# Patient Record
Sex: Female | Born: 1955 | Race: White | Hispanic: No | Smoking: Former smoker
Health system: Southern US, Community
[De-identification: ages and names within clinical notes are randomized; demographics above are authoritative.]

## PROBLEM LIST (undated history)

## (undated) DIAGNOSIS — E8881 Metabolic syndrome: Secondary | ICD-10-CM

## (undated) DIAGNOSIS — M549 Dorsalgia, unspecified: Secondary | ICD-10-CM

## (undated) DIAGNOSIS — I429 Cardiomyopathy, unspecified: Secondary | ICD-10-CM

## (undated) DIAGNOSIS — E079 Disorder of thyroid, unspecified: Secondary | ICD-10-CM

## (undated) DIAGNOSIS — I10 Essential (primary) hypertension: Secondary | ICD-10-CM

## (undated) DIAGNOSIS — N920 Excessive and frequent menstruation with regular cycle: Secondary | ICD-10-CM

## (undated) DIAGNOSIS — N959 Unspecified menopausal and perimenopausal disorder: Secondary | ICD-10-CM

## (undated) DIAGNOSIS — E049 Nontoxic goiter, unspecified: Secondary | ICD-10-CM

## (undated) DIAGNOSIS — G5600 Carpal tunnel syndrome, unspecified upper limb: Secondary | ICD-10-CM

## (undated) DIAGNOSIS — E0590 Thyrotoxicosis, unspecified without thyrotoxic crisis or storm: Secondary | ICD-10-CM

## (undated) DIAGNOSIS — E669 Obesity, unspecified: Secondary | ICD-10-CM

## (undated) DIAGNOSIS — M62838 Other muscle spasm: Secondary | ICD-10-CM

## (undated) DIAGNOSIS — D649 Anemia, unspecified: Secondary | ICD-10-CM

## (undated) DIAGNOSIS — M542 Cervicalgia: Secondary | ICD-10-CM

## (undated) DIAGNOSIS — H8110 Benign paroxysmal vertigo, unspecified ear: Secondary | ICD-10-CM

## (undated) HISTORY — DX: Other muscle spasm: M62.838

## (undated) HISTORY — DX: Nontoxic goiter, unspecified: E04.9

## (undated) HISTORY — DX: Disorder of thyroid, unspecified: E07.9

## (undated) HISTORY — DX: Cardiomyopathy, unspecified: I42.9

## (undated) HISTORY — DX: Unspecified menopausal and perimenopausal disorder: N95.9

## (undated) HISTORY — DX: Anemia, unspecified: D64.9

## (undated) HISTORY — DX: Obesity, unspecified: E66.9

## (undated) HISTORY — DX: Metabolic syndrome: E88.810

## (undated) HISTORY — DX: Cervicalgia: M54.2

## (undated) HISTORY — DX: Carpal tunnel syndrome, unspecified upper limb: G56.00

## (undated) HISTORY — DX: Essential (primary) hypertension: I10

## (undated) HISTORY — DX: Thyrotoxicosis, unspecified without thyrotoxic crisis or storm: E05.90

## (undated) HISTORY — DX: Excessive and frequent menstruation with regular cycle: N92.0

## (undated) HISTORY — DX: Metabolic syndrome: E88.81

## (undated) HISTORY — DX: Dorsalgia, unspecified: M54.9

## (undated) HISTORY — DX: Benign paroxysmal vertigo, unspecified ear: H81.10

## (undated) HISTORY — PX: CHOLECYSTECTOMY: SHX55

---

## 1986-10-23 HISTORY — PX: HEEL SPUR SURGERY: SHX665

## 1989-10-23 HISTORY — PX: TUBAL LIGATION: SHX77

## 2006-02-20 ENCOUNTER — Ambulatory Visit: Payer: Self-pay | Admitting: Family Medicine

## 2006-03-05 ENCOUNTER — Ambulatory Visit: Payer: Self-pay | Admitting: Family Medicine

## 2006-04-08 ENCOUNTER — Ambulatory Visit: Payer: Self-pay | Admitting: Family Medicine

## 2006-04-09 ENCOUNTER — Ambulatory Visit: Payer: Self-pay | Admitting: Family Medicine

## 2006-05-06 ENCOUNTER — Ambulatory Visit: Payer: Self-pay | Admitting: Family Medicine

## 2006-05-23 HISTORY — PX: GREAT TOE ARTHRODESIS, METATARSALPHALANGEAL JOINT: SUR56

## 2006-06-17 ENCOUNTER — Ambulatory Visit: Payer: Self-pay | Admitting: Family Medicine

## 2006-07-29 ENCOUNTER — Encounter: Payer: Self-pay | Admitting: Family Medicine

## 2006-07-29 ENCOUNTER — Ambulatory Visit: Payer: Self-pay | Admitting: Family Medicine

## 2006-07-29 ENCOUNTER — Other Ambulatory Visit: Admission: RE | Admit: 2006-07-29 | Discharge: 2006-07-29 | Payer: Self-pay | Admitting: Family Medicine

## 2006-09-30 DIAGNOSIS — L219 Seborrheic dermatitis, unspecified: Secondary | ICD-10-CM | POA: Insufficient documentation

## 2006-09-30 DIAGNOSIS — I10 Essential (primary) hypertension: Secondary | ICD-10-CM | POA: Insufficient documentation

## 2006-09-30 DIAGNOSIS — E7800 Pure hypercholesterolemia, unspecified: Secondary | ICD-10-CM | POA: Insufficient documentation

## 2006-10-06 ENCOUNTER — Encounter: Payer: Self-pay | Admitting: Family Medicine

## 2006-10-06 DIAGNOSIS — E042 Nontoxic multinodular goiter: Secondary | ICD-10-CM | POA: Insufficient documentation

## 2006-10-06 DIAGNOSIS — E348 Other specified endocrine disorders: Secondary | ICD-10-CM | POA: Insufficient documentation

## 2006-10-29 ENCOUNTER — Encounter: Payer: Self-pay | Admitting: Family Medicine

## 2006-10-29 ENCOUNTER — Ambulatory Visit: Payer: Self-pay | Admitting: Family Medicine

## 2006-10-29 DIAGNOSIS — G43009 Migraine without aura, not intractable, without status migrainosus: Secondary | ICD-10-CM | POA: Insufficient documentation

## 2006-11-04 ENCOUNTER — Ambulatory Visit: Payer: Self-pay | Admitting: Family Medicine

## 2006-11-04 LAB — CONVERTED CEMR LAB: Blood Glucose, Fasting: 105 mg/dL

## 2007-01-26 ENCOUNTER — Ambulatory Visit: Payer: Self-pay | Admitting: Family Medicine

## 2007-01-26 DIAGNOSIS — M62838 Other muscle spasm: Secondary | ICD-10-CM | POA: Insufficient documentation

## 2007-01-26 DIAGNOSIS — G5600 Carpal tunnel syndrome, unspecified upper limb: Secondary | ICD-10-CM | POA: Insufficient documentation

## 2007-01-30 ENCOUNTER — Telehealth: Payer: Self-pay | Admitting: Family Medicine

## 2007-02-19 ENCOUNTER — Ambulatory Visit: Payer: Self-pay | Admitting: Family Medicine

## 2007-02-23 ENCOUNTER — Telehealth: Payer: Self-pay | Admitting: Family Medicine

## 2007-03-02 ENCOUNTER — Telehealth: Payer: Self-pay | Admitting: Family Medicine

## 2007-03-05 ENCOUNTER — Ambulatory Visit: Payer: Self-pay | Admitting: Family Medicine

## 2007-03-05 DIAGNOSIS — M549 Dorsalgia, unspecified: Secondary | ICD-10-CM | POA: Insufficient documentation

## 2007-03-05 DIAGNOSIS — E669 Obesity, unspecified: Secondary | ICD-10-CM | POA: Insufficient documentation

## 2007-03-05 LAB — CONVERTED CEMR LAB
Bilirubin Urine: NEGATIVE
Blood in Urine, dipstick: NEGATIVE
Glucose, Urine, Semiquant: NEGATIVE
Protein, U semiquant: NEGATIVE
Specific Gravity, Urine: 1.01
pH: 6

## 2007-03-11 ENCOUNTER — Encounter: Payer: Self-pay | Admitting: Family Medicine

## 2007-03-12 ENCOUNTER — Encounter: Payer: Self-pay | Admitting: Family Medicine

## 2007-03-12 LAB — CONVERTED CEMR LAB
ALT: 13 units/L (ref 0–35)
AST: 13 units/L (ref 0–37)
CO2: 21 meq/L (ref 19–32)
Cholesterol: 194 mg/dL (ref 0–200)
Creatinine, Ser: 0.69 mg/dL (ref 0.40–1.20)
Free T4: 0.93 ng/dL (ref 0.89–1.80)
LDL Cholesterol: 116 mg/dL — ABNORMAL HIGH (ref 0–99)
Sodium: 139 meq/L (ref 135–145)
T3, Free: 2.8 pg/mL (ref 2.3–4.2)
TSH: 2.142 microintl units/mL (ref 0.350–5.50)
Total Bilirubin: 0.9 mg/dL (ref 0.3–1.2)
Total CHOL/HDL Ratio: 3.5
Total Protein: 7.6 g/dL (ref 6.0–8.3)
VLDL: 22 mg/dL (ref 0–40)

## 2007-03-18 ENCOUNTER — Ambulatory Visit: Payer: Self-pay | Admitting: Family Medicine

## 2007-04-16 ENCOUNTER — Telehealth (INDEPENDENT_AMBULATORY_CARE_PROVIDER_SITE_OTHER): Payer: Self-pay | Admitting: *Deleted

## 2007-04-29 ENCOUNTER — Ambulatory Visit: Payer: Self-pay | Admitting: Family Medicine

## 2007-05-19 ENCOUNTER — Ambulatory Visit: Payer: Self-pay | Admitting: Family Medicine

## 2007-05-22 ENCOUNTER — Encounter: Payer: Self-pay | Admitting: Family Medicine

## 2007-06-08 ENCOUNTER — Telehealth: Payer: Self-pay | Admitting: Family Medicine

## 2007-08-10 ENCOUNTER — Ambulatory Visit: Payer: Self-pay | Admitting: Family Medicine

## 2007-08-10 ENCOUNTER — Other Ambulatory Visit: Admission: RE | Admit: 2007-08-10 | Discharge: 2007-08-10 | Payer: Self-pay | Admitting: Family Medicine

## 2007-08-10 ENCOUNTER — Encounter: Payer: Self-pay | Admitting: Family Medicine

## 2007-08-10 DIAGNOSIS — N959 Unspecified menopausal and perimenopausal disorder: Secondary | ICD-10-CM | POA: Insufficient documentation

## 2007-08-11 ENCOUNTER — Telehealth: Payer: Self-pay | Admitting: Family Medicine

## 2007-08-12 ENCOUNTER — Encounter: Payer: Self-pay | Admitting: Family Medicine

## 2007-08-12 LAB — CONVERTED CEMR LAB: Pap Smear: NORMAL

## 2007-08-13 LAB — CONVERTED CEMR LAB: Progesterone: 0.6 ng/mL

## 2007-08-14 ENCOUNTER — Telehealth: Payer: Self-pay | Admitting: Family Medicine

## 2007-08-17 ENCOUNTER — Ambulatory Visit: Payer: Self-pay | Admitting: Family Medicine

## 2007-08-25 ENCOUNTER — Telehealth: Payer: Self-pay | Admitting: Family Medicine

## 2007-12-30 ENCOUNTER — Ambulatory Visit: Payer: Self-pay | Admitting: Family Medicine

## 2007-12-30 ENCOUNTER — Encounter: Admission: RE | Admit: 2007-12-30 | Discharge: 2007-12-30 | Payer: Self-pay | Admitting: Family Medicine

## 2007-12-30 DIAGNOSIS — M542 Cervicalgia: Secondary | ICD-10-CM | POA: Insufficient documentation

## 2008-01-13 ENCOUNTER — Encounter: Admission: RE | Admit: 2008-01-13 | Discharge: 2008-02-10 | Payer: Self-pay | Admitting: Family Medicine

## 2008-01-13 ENCOUNTER — Ambulatory Visit: Payer: Self-pay | Admitting: Family Medicine

## 2008-01-13 ENCOUNTER — Encounter: Payer: Self-pay | Admitting: Family Medicine

## 2008-02-10 ENCOUNTER — Encounter: Payer: Self-pay | Admitting: Family Medicine

## 2008-02-12 ENCOUNTER — Telehealth: Payer: Self-pay | Admitting: Family Medicine

## 2008-02-16 ENCOUNTER — Ambulatory Visit: Payer: Self-pay | Admitting: Family Medicine

## 2008-02-16 LAB — CONVERTED CEMR LAB: WBC, Wet Prep HPF POC: NONE SEEN

## 2008-02-22 ENCOUNTER — Encounter: Payer: Self-pay | Admitting: Family Medicine

## 2008-02-24 ENCOUNTER — Telehealth: Payer: Self-pay | Admitting: Family Medicine

## 2008-02-25 ENCOUNTER — Ambulatory Visit: Payer: Self-pay | Admitting: Family Medicine

## 2008-02-25 DIAGNOSIS — N920 Excessive and frequent menstruation with regular cycle: Secondary | ICD-10-CM | POA: Insufficient documentation

## 2008-02-25 DIAGNOSIS — D649 Anemia, unspecified: Secondary | ICD-10-CM | POA: Insufficient documentation

## 2008-02-25 LAB — CONVERTED CEMR LAB: Hemoglobin: 10.1 g/dL

## 2008-02-26 LAB — CONVERTED CEMR LAB
Hemoglobin: 10.1 g/dL — ABNORMAL LOW (ref 12.0–15.0)
MCHC: 33.7 g/dL (ref 30.0–36.0)
RBC: 3.5 M/uL — ABNORMAL LOW (ref 3.87–5.11)
WBC: 5.4 10*3/uL (ref 4.0–10.5)

## 2008-03-01 ENCOUNTER — Encounter: Admission: RE | Admit: 2008-03-01 | Discharge: 2008-03-28 | Payer: Self-pay | Admitting: Family Medicine

## 2008-03-02 ENCOUNTER — Ambulatory Visit: Payer: Self-pay | Admitting: Obstetrics & Gynecology

## 2008-03-08 ENCOUNTER — Encounter: Admission: RE | Admit: 2008-03-08 | Discharge: 2008-03-08 | Payer: Self-pay | Admitting: Obstetrics & Gynecology

## 2008-03-09 ENCOUNTER — Ambulatory Visit: Payer: Self-pay | Admitting: Obstetrics & Gynecology

## 2008-03-13 ENCOUNTER — Inpatient Hospital Stay (HOSPITAL_COMMUNITY): Admission: AD | Admit: 2008-03-13 | Discharge: 2008-03-13 | Payer: Self-pay | Admitting: Obstetrics & Gynecology

## 2008-03-23 HISTORY — PX: ENDOMETRIAL ABLATION: SHX621

## 2008-03-23 HISTORY — PX: DILATION AND CURETTAGE OF UTERUS: SHX78

## 2008-03-28 ENCOUNTER — Encounter: Payer: Self-pay | Admitting: Family Medicine

## 2008-04-05 ENCOUNTER — Ambulatory Visit (HOSPITAL_COMMUNITY): Admission: RE | Admit: 2008-04-05 | Discharge: 2008-04-05 | Payer: Self-pay | Admitting: Obstetrics & Gynecology

## 2008-04-05 ENCOUNTER — Ambulatory Visit: Payer: Self-pay | Admitting: Obstetrics & Gynecology

## 2008-04-05 ENCOUNTER — Encounter: Payer: Self-pay | Admitting: Obstetrics & Gynecology

## 2008-04-27 ENCOUNTER — Ambulatory Visit: Payer: Self-pay | Admitting: Obstetrics & Gynecology

## 2008-04-27 ENCOUNTER — Encounter: Admission: RE | Admit: 2008-04-27 | Discharge: 2008-04-27 | Payer: Self-pay | Admitting: Obstetrics & Gynecology

## 2010-06-14 ENCOUNTER — Encounter: Payer: Self-pay | Admitting: Family Medicine

## 2010-06-14 ENCOUNTER — Ambulatory Visit: Payer: Self-pay | Admitting: Family Medicine

## 2010-11-13 ENCOUNTER — Ambulatory Visit: Payer: Self-pay | Admitting: Family Medicine

## 2010-11-13 LAB — CONVERTED CEMR LAB
BUN: 12 mg/dL (ref 6–23)
CO2: 30 meq/L (ref 19–32)
Chloride: 102 meq/L (ref 96–112)
Glucose, Bld: 121 mg/dL — ABNORMAL HIGH (ref 70–99)
Potassium: 3.4 meq/L — ABNORMAL LOW (ref 3.5–5.3)
Sodium: 139 meq/L (ref 135–145)

## 2010-12-25 ENCOUNTER — Ambulatory Visit
Admission: RE | Admit: 2010-12-25 | Discharge: 2010-12-25 | Payer: Self-pay | Source: Home / Self Care | Attending: Family Medicine | Admitting: Family Medicine

## 2010-12-25 ENCOUNTER — Ambulatory Visit
Admission: RE | Admit: 2010-12-25 | Discharge: 2010-12-25 | Payer: Self-pay | Source: Home / Self Care | Attending: Obstetrics & Gynecology | Admitting: Obstetrics & Gynecology

## 2010-12-25 ENCOUNTER — Encounter: Payer: Self-pay | Admitting: Obstetrics & Gynecology

## 2010-12-25 ENCOUNTER — Encounter
Admission: RE | Admit: 2010-12-25 | Discharge: 2010-12-25 | Payer: Self-pay | Source: Home / Self Care | Attending: Family Medicine | Admitting: Family Medicine

## 2010-12-26 ENCOUNTER — Encounter: Payer: Self-pay | Admitting: Obstetrics & Gynecology

## 2010-12-26 ENCOUNTER — Encounter
Admission: RE | Admit: 2010-12-26 | Discharge: 2010-12-26 | Payer: Self-pay | Source: Home / Self Care | Attending: Obstetrics & Gynecology | Admitting: Obstetrics & Gynecology

## 2010-12-26 ENCOUNTER — Telehealth: Payer: Self-pay | Admitting: Family Medicine

## 2010-12-26 LAB — CONVERTED CEMR LAB
Clue Cells Wet Prep HPF POC: NONE SEEN
Trich, Wet Prep: NONE SEEN

## 2010-12-27 ENCOUNTER — Telehealth: Payer: Self-pay | Admitting: Family Medicine

## 2011-01-02 ENCOUNTER — Ambulatory Visit
Admission: RE | Admit: 2011-01-02 | Discharge: 2011-01-02 | Payer: Self-pay | Source: Home / Self Care | Attending: Obstetrics & Gynecology | Admitting: Obstetrics & Gynecology

## 2011-01-03 ENCOUNTER — Ambulatory Visit
Admission: RE | Admit: 2011-01-03 | Discharge: 2011-01-03 | Payer: Self-pay | Source: Home / Self Care | Attending: Family Medicine | Admitting: Family Medicine

## 2011-01-03 ENCOUNTER — Encounter: Payer: Self-pay | Admitting: Obstetrics & Gynecology

## 2011-01-03 ENCOUNTER — Telehealth: Payer: Self-pay | Admitting: Family Medicine

## 2011-01-03 DIAGNOSIS — H8110 Benign paroxysmal vertigo, unspecified ear: Secondary | ICD-10-CM | POA: Insufficient documentation

## 2011-01-03 LAB — CONVERTED CEMR LAB: Trich, Wet Prep: NONE SEEN

## 2011-01-04 ENCOUNTER — Encounter: Payer: Self-pay | Admitting: Family Medicine

## 2011-01-04 LAB — CONVERTED CEMR LAB
Free T4: 1.07 ng/dL (ref 0.80–1.80)
T3, Free: 3.4 pg/mL (ref 2.3–4.2)
TSH: 0.225 microintl units/mL — ABNORMAL LOW (ref 0.350–4.500)

## 2011-01-13 ENCOUNTER — Encounter: Payer: Self-pay | Admitting: Obstetrics & Gynecology

## 2011-01-16 ENCOUNTER — Ambulatory Visit: Admit: 2011-01-16 | Payer: Self-pay | Admitting: Obstetrics & Gynecology

## 2011-01-16 ENCOUNTER — Other Ambulatory Visit: Payer: Self-pay | Admitting: Family Medicine

## 2011-01-16 DIAGNOSIS — R899 Unspecified abnormal finding in specimens from other organs, systems and tissues: Secondary | ICD-10-CM

## 2011-01-22 NOTE — Letter (Signed)
Summary: Records Dated 05-01-09 thru 05-03-10/[facility]  Records Dated 05-01-09 thru 05-03-10/[facility]   Imported By: Lanelle Bal 08/09/2010 11:59:21  _____________________________________________________________________  External Attachment:    Type:   Image     Comment:   External Document

## 2011-01-22 NOTE — Assessment & Plan Note (Signed)
Summary: HTN   Vital Signs:  Patient profile:   55 year old female Height:      64.75 inches Weight:      218 pounds Pulse rate:   92 / minute BP sitting:   123 / 73  (right arm) Cuff size:   large  Vitals Entered By: Avon Gully CMA, (AAMA) (November 13, 2010 10:15 AM) CC: f/u BP, diovan works but too expensive with no ins, ateno l makes nauseaous and dizzy   Primary Care Provider:  Nani Gasser MD  CC:  f/u BP, diovan works but too expensive with no ins, and ateno l makes nauseaous and dizzy.  History of Present Illness: f/u BP, diovan works but too expensive with no ins, ateno l makes nauseaous and dizzy.  she does take both medications at the same time.  She says often the dizziness will hit right before she takes her medications in the evening.  He will start about a half an hour to one hour before and often happens when she is driving home from work.  She has started a new job and will hopefully have insurance in two to 3 months.  She does have a few weeks' worth of the Diovan left.  She was able to afford the atenolol.  Current Medications (verified): 1)  Diovan 320 Mg Tabs (Valsartan) .Marland Kitchen.. 1 Tab By Mouth Daily 2)  Valerian Root 450 Mg Caps (Valerian) .... Take 1 Tab By Mouth At Bedtime 3)  Vitamin D 4)  Atenolol-Chlorthalidone 50-25 Mg Tabs (Atenolol-Chlorthalidone) .Marland Kitchen.. 1 Tab By Mouth Daily  Allergies (verified): 1)  ! Hydrochlorothiazide 2)  ! Prinivil 3)  ! Lopressor 4)  ! Zocor 5)  ! * Caduet 6)  Cardizem La (Diltiazem Hcl Coated Beads)  Comments:  Nurse/Medical Assistant: The patient's medications and allergies were reviewed with the patient and were updated in the Medication and Allergy Lists. Avon Gully CMA, Duncan Dull) (November 13, 2010 10:16 AM)  Physical Exam  General:  Well-developed,well-nourished,in no acute distress; alert,appropriate and cooperative throughout examination Lungs:  Normal respiratory effort, chest expands symmetrically.  Lungs are clear to auscultation, no crackles or wheezes. Heart:  Normal rate and regular rhythm. S1 and S2 normal without gallop, murmur, click, rub or other extra sounds. No carotid bruits.  Extremities:  No LE edema.  Skin:  no rashes.   Psych:  Cognition and judgment appear intact. Alert and cooperative with normal attention span and concentration. No apparent delusions, illusions, hallucinations   Impression & Recommendations:  Problem # 1:  HYPERTENSION, BENIGN SYSTEMIC (ICD-401.1) her blood pressure looks wonderful today on her current regimen.  I did give HER a 2 months worth of samples of the Diovan.  also a wrote for a 90 day prescription of the atenolol /chlorthalidone.  Her updated medication list for this problem includes:    Diovan 320 Mg Tabs (Valsartan) .Marland Kitchen... 1 tab by mouth daily    Atenolol-chlorthalidone 50-25 Mg Tabs (Atenolol-chlorthalidone) .Marland Kitchen... 1 tab by mouth daily  Orders: T-Basic Metabolic Panel [id number])  Complete Medication List: 1)  Diovan 320 Mg Tabs (Valsartan) .Marland Kitchen.. 1 tab by mouth daily 2)  Valerian Root 450 Mg Caps (Valerian) .... Take 1 tab by mouth at bedtime 3)  Vitamin D  4)  Atenolol-chlorthalidone 50-25 Mg Tabs (Atenolol-chlorthalidone) .Marland Kitchen.. 1 tab by mouth daily Prescriptions: ATENOLOL-CHLORTHALIDONE 50-25 MG TABS (ATENOLOL-CHLORTHALIDONE) 1 tab by mouth daily  #90 x 1   Entered and Authorized by:   Nani Gasser MD   Signed by:  Nani Gasser MD on 11/13/2010   Method used:   Electronically to        [facility] [phone]* (retail)       [address]       [place], [place]  [address]       Ph: [phone]       Fax: [phone]   RxID:   [id number]    Orders Added: 1)  T-Basic Metabolic Panel [id number] 2)  Est. Patient Level II [34742]

## 2011-01-22 NOTE — Assessment & Plan Note (Signed)
Summary: f/u HTN   Vital Signs:  Patient profile:   55 year old female Height:      64.75 inches Weight:      213 pounds BMI:     35.85 O2 Sat:      98 % on Room air Pulse rate:   83 / minute BP sitting:   175 / 88  (left arm) Cuff size:   large  Vitals Entered By: Payton Spark CMA (June 14, 2010 11:36 AM)  O2 Flow:  Room air CC: Elevated BP   Primary Care Provider:  Nani Gasser MD  CC:  Elevated BP.  History of Present Illness: 55 yo WF presents for HTN today.  She has been having HAs down the back of her head, nausea, wt gain in the past 2 mos.  She has a little swelling in her hands and feet.  She just moved back here from [facility].  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  She is also going thru perimenopause.  She has had mutliple adverse SEs from anti hypertensives but no true allergies.  She is currently on Diovan 320 mg/ day.  Cost is also a factor.  She has no insurance.   Current Medications (verified): 1)  Diovan 320 Mg Tabs (Valsartan) .... Take 1 Tab By Mouth Once Daily 2)  Valerian Root 450 Mg Caps (Valerian) .... Take 1 Tab By Mouth At Bedtime 3)  Vitamin D  Allergies (verified): 1)  ! Hydrochlorothiazide 2)  ! Prinivil 3)  ! Lopressor 4)  ! Zocor 5)  ! * Caduet 6)  Cardizem La (Diltiazem Hcl Coated Beads)  Past History:  Past Medical History: Reviewed history from 10/06/2006 and no changes required. Thyroid D/O 1998-1999  Social History: Reviewed history from 10/06/2006 and no changes required. Engineer, production at [employer].  14 yrs education.  Married to [employer] with 3 children from prior relationship.  Quit smoking 09/2004.  1 EtOH/wk, no drugs, 3-4 caffeinated drinks per day, no regular exercise.  Review of Systems      See HPI  Physical Exam  General:  obese WF in NAD Head:  normocephalic and atraumatic.   Eyes:  pupils equal, pupils round, and pupils reactive to light.    Mouth:  pharynx pink and moist.   Neck:  no masses.   Lungs:  Normal respiratory effort, chest expands symmetrically. Lungs are clear to auscultation, no crackles or wheezes. Heart:  Normal rate and regular rhythm. S1 and S2 normal without gallop, murmur, click, rub or other extra sounds. Pulses:  2+ radial pulses Extremities:  1+ non pitting LE edema Skin:  color normal.   Psych:  good eye contact, not anxious appearing, and not depressed appearing.     Impression & Recommendations:  Problem # 1:  HYPERTENSION, BENIGN SYSTEMIC (ICD-401.1) Reviewed notes and discussed that she has not had any true ALLERGIES to anti hypertensives.  It is important to get her BP down and we discussed the common SEs from any antihypertensive.  She will stay on Diovan for now since she has plenty at home.  Add Atenolol - Chlorthalidone for high BP, fluid retention and inexpensive cost.  Call if any problems.  Labs updated in [place].  RTC in 1 month.  Work on low sodium diet, exercise, wt loss also. The following medications were removed from the medication list:    Avalide 300-12.5 Mg Tabs (Irbesartan-hydrochlorothiazide) .Marland Kitchen... Take  1 tablet by mouth once a day    Dynacirc Cr 5 Mg Tb24 (Isradipine) .Marland Kitchen... Take 1 tablet by mouth once a day Her updated medication list for this problem includes:    Diovan 320 Mg Tabs (Valsartan) .Marland Kitchen... 1 tab by mouth daily    Atenolol-chlorthalidone 50-25 Mg Tabs (Atenolol-chlorthalidone) .Marland Kitchen... 1 tab by mouth daily  Complete Medication List: 1)  Diovan 320 Mg Tabs (Valsartan) .Marland Kitchen.. 1 tab by mouth daily 2)  Valerian Root 450 Mg Caps (Valerian) .... Take 1 tab by mouth at bedtime 3)  Vitamin D  4)  Atenolol-chlorthalidone 50-25 Mg Tabs (Atenolol-chlorthalidone) .Marland Kitchen.. 1 tab by mouth daily  Patient Instructions: 1)  Add ATenolol/ Chlorathidone once a day for high BP. 2)  Stay on Diovan daily. 3)  Work on LOW sodium diet, exercise, wt loss. 4)  Return for f/u in 6  wks. Prescriptions: ATENOLOL-CHLORTHALIDONE 50-25 MG TABS (ATENOLOL-CHLORTHALIDONE) 1 tab by mouth daily  #30 x 2   Entered and Authorized by:   Seymour Bars DO   Signed by:   Seymour Bars DO on 06/14/2010   Method used:   Electronically to        [facility] [phone]* (retail)       [address]       [place], [place]  [address]       Ph: [phone]       Fax: [phone]   RxID:   [id number]

## 2011-01-24 ENCOUNTER — Ambulatory Visit (HOSPITAL_COMMUNITY)
Admission: RE | Admit: 2011-01-24 | Discharge: 2011-01-24 | Disposition: A | Discharge status: Home / Self Care | Payer: Self-pay | Source: Ambulatory Visit | Attending: Family Medicine | Admitting: Family Medicine

## 2011-01-24 ENCOUNTER — Telehealth: Payer: Self-pay | Admitting: Family Medicine

## 2011-01-24 DIAGNOSIS — R899 Unspecified abnormal finding in specimens from other organs, systems and tissues: Secondary | ICD-10-CM

## 2011-01-24 DIAGNOSIS — R946 Abnormal results of thyroid function studies: Secondary | ICD-10-CM | POA: Insufficient documentation

## 2011-01-24 DIAGNOSIS — E042 Nontoxic multinodular goiter: Secondary | ICD-10-CM | POA: Insufficient documentation

## 2011-01-24 NOTE — Progress Notes (Signed)
Summary: Muscle relaxer  Phone Note Call from Patient Call back at Home Phone [phone]   Caller: Patient Call For: Nani Gasser MD Summary of Call: Pt calls and states that [facility] on [address] can not get the muscle relaxer you sent in for her- has not had it in stock now for some time. Choices they have in stock are Soma 350mg , Flexeril 10mg , or Xanaflex 4mg .   Pt would like for you to send the muscle relaxer you sent to [facility] on [address] to the [facility] in [place] and see if they have it before changing the med Initial call taken by: Kathlene November LPN,  December 26, 2010 2:14 PM  Follow-up for Phone Call        OK, resent to [facility] here in town.  Follow-up by: Nani Gasser MD,  December 27, 2010 8:03 AM    Prescriptions: SKELAXIN 800 MG TABS (METAXALONE) Take 1 tablet by mouth three times a day as needed muscle spasm.  #60 x 1   Entered and Authorized by:   Nani Gasser MD   Signed by:   Nani Gasser MD on 12/27/2010   Method used:   Electronically to        [facility] [phone]* (retail)       [address] [place], [place]  [address]       Ph: [phone]       Fax: [phone]   RxID:   [id number]

## 2011-01-24 NOTE — Progress Notes (Signed)
Summary: thyroid level high  Phone Note Call from Patient Call back at Home Phone [phone]   Caller: Patient Call For: Nani Gasser MD Summary of Call: Dr. leggett done bloodwork and told her her thyroid level was high and she feels dizziness and heart gonna jump out of chest.Pt states has appt this morning at 11:30 Initial call taken by: Kathlene November LPN,  January 03, 2011 9:27 AM

## 2011-01-24 NOTE — Assessment & Plan Note (Signed)
Summary: Vertigo   Vital Signs:  Patient profile:   55 year old female Height:      64.75 inches Weight:      224 pounds Pulse rate:   100 / minute BP sitting:   145 / 80  (right arm) Cuff size:   large  Vitals Entered By: Avon Gully CMA, Duncan Dull) (January 03, 2011 11:31 AM) CC: dizziness since yesterday   Primary Care Provider:  Nani Gasser MD  CC:  dizziness since yesterday.  History of Present Illness: dizziness since yesterday.  Feels like it is coming from the back of her head. Worse when  stands up.  Not so much when turns her head.. Feels nauseated with it. Feels if eats too much it is worse. Had endometrial bx yesterday and when sat up on the table afterwards that is when the dizziness started. Says her heart will pound with it. ONly had some vaginal spotting, no heavy bleeding, since then. . Driving also exacerbates her sxs but she drove her. Has felt like this before several times in the last few years. No fever, ear pain or vomiting or recent URI. Had abnormal TSH on her labs.   Current Medications (verified): 1)  Diovan Hct 320-25 Mg Tabs (Valsartan-Hydrochlorothiazide) .... Take 1 Tablet By Mouth Once A Day 2)  Valerian Root 450 Mg Caps (Valerian) .... Take 1 Tab By Mouth At Bedtime 3)  Vitamin D 4)  Flexeril 10 Mg Tabs (Cyclobenzaprine Hcl) .... 1/2 Tab By Mouth Up To Three Times A Day  Allergies (verified): 1)  ! Hydrochlorothiazide 2)  ! Prinivil 3)  ! Lopressor 4)  ! Zocor 5)  ! * Caduet 6)  Cardizem La (Diltiazem Hcl Coated Beads)  Comments:  Nurse/Medical Assistant: The patient's medications and allergies were reviewed with the patient and were updated in the Medication and Allergy Lists. Avon Gully CMA, Duncan Dull) (January 03, 2011 11:31 AM)  Family History: Reviewed history from 10/06/2006 and no changes required. DM-aunt,  HTN-father MS-mother  Social History: Unemployedt.  14 yrs education.  Married with 3 children from prior  relationship.  Quit smoking 09/2004.  1 EtOH/wk, no drugs, 3-4 caffeinated drinks per day, no regular exercise.  Physical Exam  General:  Well-developed,well-nourished,in no acute distress; alert,appropriate and cooperative throughout examination Head:  Normocephalic and atraumatic without obvious abnormalities. No apparent alopecia or balding. Eyes:  No corneal or conjunctival inflammation noted. EOMI. Perrla. Ears:  External ear exam shows no significant lesions or deformities.  Otoscopic examination reveals clear canals, tympanic membranes are intact bilaterally without bulging, retraction, inflammation or discharge. Hearing is grossly normal bilaterally. Nose:  External nasal examination shows no deformity or inflammation.  Mouth:  Oral mucosa and oropharynx without lesions or exudates.  Teeth in good repair. Neck:  No deformities, masses, or tenderness noted. Lungs:  Normal respiratory effort, chest expands symmetrically. Lungs are clear to auscultation, no crackles or wheezes. Heart:  Normal rate and regular rhythm. S1 and S2 normal without gallop, murmur, click, rub or other extra sounds. Pulses:  Radial 2+  Neurologic:  alert & oriented X3, cranial nerves II-XII intact, and gait normal.  + diks Hallpike to the left.  Cervical Nodes:  No lymphadenopathy noted Psych:  Cognition and judgment appear intact. Alert and cooperative with normal attention span and concentration. No apparent delusions, illusions, hallucinations   Impression & Recommendations:  Problem # 1:  BENIGN POSITIONAL VERTIGO (ICD-386.11)  Demonstrated maneuvers to self-treat vertigo. Patient to call to be seen if no  improvement in 2-3 weeks, sooner if worse. Can use meclizine per if would like.  H.O given.   Problem # 2:  THYROID FUNCTION TEST, ABNORMAL (ICD-794.5) Her TSH was low with her GYN so she needs further w/u but she doesn't have insurance right now. I asked her to check with [facility] and see if she has coverage  for [facility] at a [facility] and if so we can get them done there.   Complete Medication List: 1)  Diovan Hct 320-25 Mg Tabs (Valsartan-hydrochlorothiazide) .... Take 1 tablet by mouth once a day 2)  Valerian Root 450 Mg Caps (Valerian) .... Take 1 tab by mouth at bedtime 3)  Vitamin D  4)  Flexeril 10 Mg Tabs (Cyclobenzaprine hcl) .... 1/2 tab by mouth up to three times a day   Orders Added: 1)  Est. Patient Level IV [[address]]  Appended Document: Vertigo

## 2011-01-24 NOTE — Progress Notes (Signed)
Summary: Needs different med again  Phone Note Call from Patient Call back at Home Phone [phone]   Caller: Patient Call For: Nani Gasser MD Summary of Call: Pt can not afford the Skelaxin that was sent to [facility] it was 200.00. Pt wanted to know if you could send in something for pain that is generic that will help but not zone her out or an arthritis med. Please advise. Send to [facility] on [address]. [facility] Initial call taken by: Kathlene November LPN,  December 27, 2010 3:12 PM  Follow-up for Phone Call        Rx Called In Follow-up by: Nani Gasser MD,  December 27, 2010 3:37 PM  Additional Follow-up for Phone Call Additional follow up Details #1::        Pt notified. Additional Follow-up by: Kathlene November LPN,  December 27, 2010 3:44 PM    New/Updated Medications: FLEXERIL 10 MG TABS (CYCLOBENZAPRINE HCL) 1/2 tab by mouth up to three times a day Prescriptions: FLEXERIL 10 MG TABS (CYCLOBENZAPRINE HCL) 1/2 tab by mouth up to three times a day  #45 x 0   Entered and Authorized by:   Nani Gasser MD   Signed by:   Nani Gasser MD on 12/27/2010   Method used:   Electronically to        [facility] [phone]* (retail)       [address]       [place], [place]  [address]       Ph: [phone]       Fax: [phone]   RxID:   [id number]

## 2011-01-24 NOTE — Assessment & Plan Note (Signed)
Summary: BACK PROBLEMS/NUMBNESS IN HAND/ARM   Vital Signs:  Patient profile:   55 year old female Height:      64.75 inches Weight:      223 pounds Pulse rate:   88 / minute BP sitting:   155 / 75  (right arm) Cuff size:   large  Vitals Entered By: Avon Gully CMA, (AAMA) (December 25, 2010 11:20 AM) CC: neck,shoulder and upper back pain   Primary Care Provider:  Nani Gasser MD  CC:  neck and shoulder and upper back pain.  History of Present Illness: neck,shoulder and upper back pain.  INitally started about 3 years ago. 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 Hand will go numb and will stinging around her vertebra in her back. Using Tylenol and heat and massage. Did do PT about 2-3 years ago for this area and the neck pain.  Says this actually aggrevated her neck.  Say sthe heat and biofreeze and the TENS unit all did seem to help initially.  she has gained about 5 pounds since last time I saw her.  Current Medications (verified): 1)  Diovan 320 Mg Tabs (Valsartan) .Marland Kitchen.. 1 Tab By Mouth Daily 2)  Valerian Root 450 Mg Caps (Valerian) .... Take 1 Tab By Mouth At Bedtime 3)  Vitamin D 4)  Atenolol-Chlorthalidone 50-25 Mg Tabs (Atenolol-Chlorthalidone) .Marland Kitchen.. 1 Tab By Mouth Daily  Allergies (verified): 1)  ! Hydrochlorothiazide 2)  ! Prinivil 3)  ! Lopressor 4)  ! Zocor 5)  ! * Caduet 6)  Cardizem La (Diltiazem Hcl Coated Beads)  Comments:  Nurse/Medical Assistant: The patient's medications and allergies were reviewed with the patient and were updated in the Medication and Allergy Lists. Avon Gully CMA, Duncan Dull) (December 25, 2010 11:21 AM)  Physical Exam  General:  Well-developed,well-nourished,in no acute distress; alert,appropriate and cooperative throughout examination Head:  Normocephalic and atraumatic without obvious abnormalities. No apparent alopecia or balding. Msk:  she is mildly tender of the lower cervical and upper thoracic spine.  She  is also tender over the bilateral trapezius muscles.  Her neck has normal range of motion of her shoulders have normal range of motion.  Strength in her shoulders elbows and wrists are 5/5 bilaterally.   Impression & Recommendations:  Problem # 1:  BACK PAIN (ICD-724.5) Assessment Deteriorated at this point and will get x-rays and she's had pain on and off for the last 3 years it is suddenly worse.  She has completed physical therapy 3 years ago and did have some improvement.  For the time being I recommend Tylenol since she should really avoid NSAIDs because her blood pressure.  We could also try a muscle relaxer.  She feels she needs a stronger she can let me know.  Certainly she can continue with the massage and that seems to help.  If she is not noticing some improvement with this in the next couple weeks consider an MRI, she has very completed physical therapy and is having hand numbness. Orders: T-DG Cervical Spine 2-3 Views ([address]) T-DG Thoracic Spine 2 Views [id number])  Her updated medication list for this problem includes:    Skelaxin 800 Mg Tabs (Metaxalone) .Marland Kitchen... Take 1 tablet by mouth three times a day as needed muscle spasm.  Complete Medication List: 1)  Diovan Hct 320-25 Mg Tabs (Valsartan-hydrochlorothiazide) .... Take 1 tablet by mouth once a day 2)  Valerian Root 450 Mg Caps (Valerian) .... Take 1 tab by mouth at bedtime  3)  Vitamin D  4)  Skelaxin 800 Mg Tabs (Metaxalone) .... Take 1 tablet by mouth three times a day as needed muscle spasm.  Patient Instructions: 1)  Please schedule a follow-up appointment in 2 months to recheck Blood pressure 2)  We will call wiht the xray results.   Prescriptions: SKELAXIN 800 MG TABS (METAXALONE) Take 1 tablet by mouth three times a day as needed muscle spasm.  #60 x 1   Entered and Authorized by:   Nani Gasser MD   Signed by:   Nani Gasser MD on 12/25/2010   Method used:   Electronically to        [facility] [phone]* (retail)       [address]       [place], [place]  [address]       Ph: [phone]       Fax: [phone]   RxID:   [id number] CHLORTHALIDONE 25 MG TABS (CHLORTHALIDONE) Take 1 tablet by mouth once a day  #30 x 1   Entered and Authorized by:   Nani Gasser MD   Signed by:   Nani Gasser MD on 12/25/2010   Method used:   Electronically to        [facility] [phone]* (retail)       [address]       [place], [place]  [address]       Ph: [phone]       Fax: [phone]   RxID:   [id number]    Orders Added: 1)  T-DG Cervical Spine 2-3 Views [72040] 2)  T-DG Thoracic Spine 2 Views [72070] 3)  Est. Patient Level IV [42706]

## 2011-01-25 ENCOUNTER — Ambulatory Visit (HOSPITAL_COMMUNITY)
Admission: RE | Admit: 2011-01-25 | Discharge: 2011-01-25 | Disposition: A | Discharge status: Home / Self Care | Payer: Self-pay | Source: Ambulatory Visit | Attending: Family Medicine | Admitting: Family Medicine

## 2011-01-25 ENCOUNTER — Telehealth: Payer: Self-pay | Admitting: Family Medicine

## 2011-01-25 MED ORDER — SODIUM PERTECHNETATE TC 99M INJECTION
10.0000 | Freq: Once | INTRAVENOUS | Status: AC | PRN
  Administered 2011-01-25: 10 via INTRAVENOUS

## 2011-01-25 MED ORDER — SODIUM IODIDE I 131 CAPSULE
50.0000 | Freq: Once | INTRAVENOUS | Status: AC
  Administered 2011-01-24: 1000 via ORAL

## 2011-01-29 ENCOUNTER — Telehealth: Payer: Self-pay | Admitting: Family Medicine

## 2011-01-30 NOTE — Progress Notes (Signed)
Summary: [facility]-order Clarification  Phone Note From Other Clinic Call back at [phone]   Caller: [facility] Nuc Med Summary of Call: Order clarification, do you just want uptake when pt returns tomorrow or do you want images also?  Please advise and I will call nuc med. Initial call taken by: Francee Piccolo CMA Duncan Dull),  January 24, 2011 1:50 PM  Follow-up for Phone Call        Images.  Follow-up by: Nani Gasser MD,  January 24, 2011 2:06 PM  Additional Follow-up for Phone Call Additional follow up Details #1::        RC to Amber, advised pt needs images also.  New order requested.  Fax to [phone].     Additional Follow-up by: Francee Piccolo CMA Duncan Dull),  January 24, 2011 4:46 PM    Additional Follow-up for Phone Call Additional follow up Details #2::    order printed and faxed. Follow-up by: Francee Piccolo CMA Duncan Dull),  January 25, 2011 9:47 AM

## 2011-01-31 ENCOUNTER — Ambulatory Visit (HOSPITAL_COMMUNITY): Payer: Self-pay

## 2011-02-01 ENCOUNTER — Other Ambulatory Visit (HOSPITAL_COMMUNITY): Payer: Self-pay

## 2011-02-07 NOTE — Progress Notes (Addendum)
Summary: [facility]-Test results/meds  Phone Note Call from Patient Call back at Home Phone [phone]   Caller: Patient Summary of Call: pt is requesting test results.  Advised pt that Dr. Linford Arnold has not reviewed results.  Pt would like generic rx called to pharm.  Advised pt we would call her with results when we have them. Initial call taken by: Francee Piccolo CMA Duncan Dull),  January 29, 2011 9:57 AM  Follow-up for Phone Call        I will look over results later today if I get a chance.  Follow-up by: Nani Gasser MD,  January 29, 2011 11:34 AM  Additional Follow-up for Phone Call Additional follow up Details #1::        RC from pt wanting to know if test results had been reviewed or meds called in.  Advised pt that results had not been reviewed.  Pt states she really needs meds.  Advised pt MD out of office and I can't tell her when results will be reviewed.  Pt states it is very urgent that she have meds ASAP Additional Follow-up by: Francee Piccolo CMA Duncan Dull),  January 29, 2011 2:02 PM

## 2011-02-07 NOTE — Progress Notes (Addendum)
Summary: Thyroid meds  Phone Note Call from Patient Call back at Work Phone [phone]   Caller: Patient Summary of Call: Pt does not have insurance so she is requesting a generic form of thyroid meds or if you have samples that she can try she would like to pick them up, if Rx pls send it to [facility] [address]  Initial call taken by: Lannette Donath,  January 25, 2011 3:00 PM  Follow-up for Phone Call        called pt on friday and left her a message stating we have not put her on thyroid meds and we are waiting to hear back about thyroid uptake scan.was not sure what message was about and left message to call back Follow-up by: Avon Gully CMA, Duncan Dull),  January 28, 2011 8:50 AM

## 2011-02-18 ENCOUNTER — Encounter: Payer: Self-pay | Admitting: Endocrinology

## 2011-02-18 ENCOUNTER — Other Ambulatory Visit: Payer: Self-pay

## 2011-02-18 ENCOUNTER — Other Ambulatory Visit: Payer: Self-pay | Admitting: Endocrinology

## 2011-02-18 ENCOUNTER — Ambulatory Visit (INDEPENDENT_AMBULATORY_CARE_PROVIDER_SITE_OTHER): Payer: Self-pay | Admitting: Endocrinology

## 2011-02-18 DIAGNOSIS — E0590 Thyrotoxicosis, unspecified without thyrotoxic crisis or storm: Secondary | ICD-10-CM

## 2011-02-18 DIAGNOSIS — R946 Abnormal results of thyroid function studies: Secondary | ICD-10-CM

## 2011-02-18 LAB — TSH: TSH: 4.54 u[IU]/mL (ref 0.35–5.50)

## 2011-02-18 LAB — T4, FREE: Free T4: 0.5 ng/dL — ABNORMAL LOW (ref 0.60–1.60)

## 2011-02-26 ENCOUNTER — Ambulatory Visit: Payer: Self-pay | Admitting: Family Medicine

## 2011-02-26 ENCOUNTER — Encounter: Payer: Self-pay | Admitting: Family Medicine

## 2011-02-28 NOTE — Assessment & Plan Note (Signed)
Summary: new endo/abn thyroid function/methaney/cd   Vital Signs:  Patient profile:   55 year old female Height:      64.75 inches Weight:      227 pounds BMI:     38.20 O2 Sat:      96 % on Room air Temp:     98.1 degrees F oral Pulse rate:   77 / minute BP sitting:   168 / 102  (left arm) Cuff size:   regular  Vitals Entered By: Margaret Pyle, CMA (February 18, 2011 1:29 PM)  O2 Flow:  Room air CC: New Endo, abnormal thyroid function/DBD   Primary Provider:  Nani Gasser MD  CC:  New Endo and abnormal thyroid function/DBD.  History of Present Illness: pt states 2 mos of moderate palpitations in the chest, in the context of sleep, and assoc fatigue.  she says she was rx'ed 3 pills three times a day, for "high thyroid,"  approx 12-14 years ago, in [place].  she says the medicine was tapered off approx 12 years ago.  she says sxs are slightly improved with tapazole, which was resumed a few weeks ago.  Allergies: 1)  ! Hydrochlorothiazide 2)  ! Prinivil 3)  ! Lopressor 4)  ! Zocor 5)  ! 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 DM-aunt,  HTN-father MS-mother sister: hypothyroidism  Social History: Reviewed history from 01/03/2011 and no changes  required. Unemployed.  14 yrs education.  Married with 3 children from prior relationship.  Quit smoking 09/2004.  1 EtOH/wk, no drugs, 3-4 caffeinated drinks per day, no regular exercise.  Review of Systems       The patient complains of weight gain.         denies hoarseness, double vision, sob, diarrhea, polyuria, myalgias, excessive diaphoresis, tremor, anxiety, and hypoglycemia.  she has slight nausea dizziness, chest pain x a few seconds at a time, acral numbness/tingling, depression, easy bruising, rhinorrhea,  and headache.  Physical Exam  General:  obese.  no distress  Head:  head: no deformity eyes: no periorbital swelling, no proptosis external nose and ears are normal mouth: no lesion seen Neck:  i do not appreciate a goiter Lungs:  Clear to auscultation bilaterally. Normal respiratory effort.  Heart:  Regular rate and rhythm without murmurs or gallops noted. Normal S1,S2.   Msk:  muscle bulk and strength are grossly normal.  no obvious joint swelling.  gait is normal and steady  Extremities:  no deformity.  no edema  Neurologic:  cn 2-12 grossly intact.   readily moves all 4's.   sensation is intact to touch on all 4's Skin:  normal texture and temp.  no rash.  not diaphoretic  Cervical Nodes:  No significant adenopathy.  Psych:  Alert and cooperative; normal mood and affect; normal attention span and concentration.   Additional Exam:  FastTSH                   4.54 uIU/mL  0.35-5.50 Free T4              [L]  0.50 ng/dL      Impression & Recommendations:  Problem # 1:  HYPERTHYROIDISM (ICD-242.90) we discussed the causes, risks, and treatment options of hyperthyroidism pt chooses to continue tapazole for now. slightly overcontrolled  Problem # 2:  MENOPAUSAL DISORDER (ICD-627.9) these sxs can be difficult to tell apart from #1  Problem # 3:  GOITER, NONTOXIC MULTINODULAR (ICD-241.1) usually hereditary  Medications Added to Medication List  This Visit: 1)  Methimazole 5 Mg Tabs (Methimazole) .... Take 1 tablet by mouth three times a week  Other Orders: TLB-TSH (Thyroid Stimulating Hormone) (84443-TSH) TLB-T4 (Thyrox), Free [phone]) Consultation Level III (03474)  Patient Instructions: 1)  blood tests are being ordered for you today.  please call [phone] to hear your test results. 2)  pending the test results, please continue the same medications for now. 3)  if ever you have fever while taking this medication, stop it and call us, because of the risk of a rare side-effect. 4)  Please schedule a follow-up appointment in 3 months. 5)  (update: i left message on phone-tree:  reduce tapazole to 5 mg three times weekly (m, w, f)   Orders Added: 1)  TLB-TSH (Thyroid Stimulating Hormone) [84443-TSH] 2)  TLB-T4 (Thyrox), Free [[id number]] 3)  Consultation Level III [18841]

## 2011-03-04 ENCOUNTER — Telehealth: Payer: Self-pay | Admitting: Family Medicine

## 2011-03-05 NOTE — Assessment & Plan Note (Signed)
Summary: f/u-vew   Vital Signs:  Patient profile:   55 year old female Height:      64.75 inches Weight:      228 pounds Pulse rate:   67 / minute BP sitting:   162 / 88  (right arm) Cuff size:   large  Vitals Entered By: Avon Gully CMA, Duncan Dull) (February 26, 2011 4:06 PM) CC: f/u thyroid   Primary Care Provider:  Nani Gasser MD  CC:  f/u thyroid.  History of Present Illness: Appt on 2/27 with endocrinology. Was having inc HR and feeling her chest was going to jump out of her chest. Has had inc weight gain in the last 6 weeks.  He dec her pill to 3 x a week. Feels her sxs have been better since then.    Her BP was been increased. Still taking her meds.  No sleeping well, irritable.  hot flashes and sweets.  She has tried taking [employer] and that does help some. Also tried Gingko and red clover. Has tried sensa for weight loss.  she says she is very frustrated that she is getting a significant amount weight since December which is when she feels her thyroid started acting abnormally. she also is not getting any regular exercise currently. She feels she eats a pretty healthy guy with lots of fruits and vegetables and low fat meat options. of note she does have the uterus and ovaries. she did have an endometrial ablation in 2009. she only occasionally has some spotting. The last time she had a little spotting was in December.  Current Medications (verified): 1)  Diovan Hct 320-25 Mg Tabs (Valsartan-Hydrochlorothiazide) .... Take 1 Tablet By Mouth Once A Day 2)  Valerian Root 450 Mg Caps (Valerian) .... Take 1 Tab By Mouth At Bedtime 3)  Vitamin D 4)  Flexeril 10 Mg Tabs (Cyclobenzaprine Hcl) .... 1/2 Tab By Mouth Up To Three Times A Day 5)  Methimazole 5 Mg Tabs (Methimazole) .... Take 1 Tablet By Mouth Three Times A Week  Allergies (verified): 1)  ! Prinivil 2)  ! Lopressor 3)  ! Zocor 4)  ! * Caduet 5)  Cardizem La (Diltiazem Hcl Coated  Beads)  Comments:  Nurse/Medical Assistant: The patient's medications and allergies were reviewed with the patient and were updated in the Medication and Allergy Lists. Avon Gully CMA, Duncan Dull) (February 26, 2011 4:06 PM)  Past History:  Family History: Last updated: 02/18/2011 DM-aunt,  HTN-father MS-mother sister: hypothyroidism  Social History: Last updated: 02/18/2011 Unemployed.  14 yrs education.  Married with 3 children from prior relationship.  Quit smoking 09/2004.  1 EtOH/wk, no drugs, 3-4 caffeinated drinks per day, no regular exercise.  Past Surgical History: Ablation.    Physical Exam  General:  Well-developed,well-nourished,in no acute distress; alert,appropriate and cooperative throughout examination Lungs:  Normal respiratory effort, chest expands symmetrically. Lungs are clear to auscultation, no crackles or wheezes. Heart:  Normal rate and regular rhythm. S1 and S2 normal without gallop, murmur, click, rub or other extra sounds.   Impression & Recommendations:  Problem # 1:  HYPERTHYROIDISM (ICD-242.90)  she is dominant and with endocrinology and is taking the medication appropriately. She exercised for better since she is to increase the methimazole dose to 3 days a week only. she says her her palpitations have improved dramatically. Her updated medication list for this problem includes:    Methimazole 5 Mg Tabs (Methimazole) .Marland Kitchen... Take 1 tablet by mouth three times a  week  Problem # 2:  HYPERTENSION, BENIGN SYSTEMIC (ICD-401.1)  her blood pressure is clearly elevated today. I did give her samples of a new medication to try. She has tried multiple blood pressure medications in the past she has not tolerated well and Diovan has been the only successful medication. thus we'll do a combination medication that has Diovan plus HCTZ plus  amlodipine. I did give her samples for a one-month supply. Asked her to call me in one week to let me know how she's feeling on  the medication. I do think that her weight gain over the last year is significantly contributing to her elevated blood pressure. If we can continue to work on that I think her blood pressure will improve. We did discuss starting a regular exercise program which she has not done. this will also help her blood blood pressure in her mood. in addition she could do a calorie count daily for her diet to make sure she is taking and the appropriate amount of calories for weight loss. I do not think she is a good candidate for weight loss medications at this time until her thyroid is under good control. she also has been motivated and start an exercise regimen. Her updated medication list for this problem includes:    Exforge Hct 10-320-25 Mg Tabs (Amlodipine-valsartan-hctz) .Marland Kitchen... Take 1 tablet by mouth once a day  Problem # 3:  MENOPAUSAL DISORDER (ICD-627.9)  we did discuss the options of hormone replacement therapy. We also discussed the potential risk including breast cancer and cardiac disease. At this point in time after trying several over-the-counter herbal treatment she would like to try hormone replacement therapy. We will try to limit this to no more than 3-5 years. I will start her on combination estrogen and progesterone since she still has an intact uterus and follow her up in 2 months. At that time can adjust her dose if needed. Her updated medication list for this problem includes:    Prempro 0.45-1.5 Mg Tabs (Conj estrog-medroxyprogest ace) .Marland Kitchen... Take 1 tablet by mouth once a day for 21 days , then off for 7 days  Problem # 4:  OBESITY NOS (ICD-278.00) We did discuss starting a regular exercise program which she has not done. this will also help her blood blood pressure in her mood. in addition she could do a calorie count daily for her diet to make sure she is taking and the appropriate amount of calories for weight loss. I do not think she is a good candidate for weight loss medications at this  time until her thyroid is under good control. she also has been motivated and start an exercise regimen.  Complete Medication List: 1)  Exforge Hct 10-320-25 Mg Tabs (Amlodipine-valsartan-hctz) .... Take 1 tablet by mouth once a day 2)  Valerian Root 450 Mg Caps (Valerian) .... Take 1 tab by mouth at bedtime 3)  Vitamin D  4)  Flexeril 10 Mg Tabs (Cyclobenzaprine hcl) .... 1/2 tab by mouth up to three times a day 5)  Methimazole 5 Mg Tabs (Methimazole) .... Take 1 tablet by mouth three times a week 6)  Prempro 0.45-1.5 Mg Tabs (Conj estrog-medroxyprogest ace) .... Take 1 tablet by mouth once a day for 21 days , then off for 7 days  Patient Instructions: 1)  Please schedule a follow-up appointment in 2 months for blood pressure and hormone check.  Prescriptions: PREMPRO 0.45-1.5 MG TABS (CONJ ESTROG-MEDROXYPROGEST ACE) Take 1 tablet by mouth once a  day for 21 days , then off for 7 days  #21 x 3   Entered and Authorized by:   Nani Gasser MD   Signed by:   Nani Gasser MD on 02/26/2011   Method used:   Electronically to        [facility] [phone]* (retail)       [address]       [place], [place]  [address]       Ph: [phone]       Fax: [phone]   RxID:   [id number] EXFORGE HCT 10-320-25 MG TABS (AMLODIPINE-VALSARTAN-HCTZ) Take 1 tablet by mouth once a day  #30 x 0   Entered and Authorized by:   Nani Gasser MD   Signed by:   Nani Gasser MD on 02/26/2011   Method used:   Samples Given   RxID:   [id number]

## 2011-03-12 NOTE — Progress Notes (Signed)
Summary: Diavan  Phone Note Call from Patient Call back at Home Phone [phone]   Caller: Patient Reason for Call: Talk to Nurse Summary of Call: Pt states the new Diavan samples gave her headaches, she took them for 4 days, she has gone back on the old Diavan Rx with the "water pills", she will need a refill soon Initial call taken by: Lannette Donath,  March 04, 2011 4:18 PM    New/Updated Medications: DIOVAN HCT 320-25 MG TABS (VALSARTAN-HYDROCHLOROTHIAZIDE) Take 1 tablet by mouth once a day Prescriptions: DIOVAN HCT 320-25 MG TABS (VALSARTAN-HYDROCHLOROTHIAZIDE) Take 1 tablet by mouth once a day  #30 x 2   Entered and Authorized by:   Nani Gasser MD   Signed by:   Nani Gasser MD on 03/05/2011   Method used:   Electronically to        [facility] [phone]* (retail)       [address]       [place], [place]  [address]       Ph: [phone]       Fax: [phone]   RxID:   [id number]

## 2011-03-18 ENCOUNTER — Telehealth: Payer: Self-pay | Admitting: Family Medicine

## 2011-03-18 NOTE — Telephone Encounter (Signed)
Pt called and states she is having palpitaions in her throat and chest and her thyroid med are making her feet swell.please advise

## 2011-03-18 NOTE — Telephone Encounter (Signed)
This is the wrong patient

## 2011-03-20 ENCOUNTER — Telehealth: Payer: Self-pay | Admitting: Family Medicine

## 2011-03-20 NOTE — Telephone Encounter (Signed)
Called and left message on pt vm that pt needs to call dr. Everardo All

## 2011-03-20 NOTE — Telephone Encounter (Signed)
Pt called and states she is having palpitaions in her throat and chest and her thyroid med are making her feet swell.please advise  Please haver her call Dr. Cassell Smiles office.

## 2011-03-26 ENCOUNTER — Encounter: Payer: Self-pay | Admitting: Family Medicine

## 2011-03-26 ENCOUNTER — Ambulatory Visit (INDEPENDENT_AMBULATORY_CARE_PROVIDER_SITE_OTHER): Payer: Self-pay | Admitting: Family Medicine

## 2011-03-26 VITALS — BP 165/92 | HR 85 | Ht 71.0 in | Wt 233.0 lb

## 2011-03-26 DIAGNOSIS — E0590 Thyrotoxicosis, unspecified without thyrotoxic crisis or storm: Secondary | ICD-10-CM

## 2011-03-26 DIAGNOSIS — I10 Essential (primary) hypertension: Secondary | ICD-10-CM

## 2011-03-26 MED ORDER — NEBIVOLOL HCL 5 MG PO TABS: 5.0000 mg | ORAL_TABLET | Freq: Every day | ORAL | Status: DC

## 2011-03-26 NOTE — Patient Instructions (Signed)
Bystolic - one a day with your other meds I will call you with recommendations from Dr. Everardo All.

## 2011-03-26 NOTE — Assessment & Plan Note (Addendum)
I will call Dr. Everardo All and see if we can inc the methimazole to 4 days per week. Though I explained that I think some of her symptoms may be related to her Blood pressure. She is really not due for follow up with him until next month. I didn't redraw labs today but if sxs not improved with better BP control and possible increase of her med then can redraw labs. She is without insurance which makes this difficult.

## 2011-03-26 NOTE — Progress Notes (Signed)
  Subjective:    Patient ID: Alice Stewart, female    DOB: 06/19/1956, 55 y.o.   MRN: [id number]  HPI Still getting palpitations in her neck and throat.  Her BP is up again.  Feels the palpitations on worse on the left. Says she just doesn't feel well. She is taking her diovan HCT consistantly. She says didn't tolerate the Exforge HCT. Says it made her feel bad. She says BP meds only work about 5-6 months for her ans she has tried several types that have caused S.E. No CP.  Occ feels SOB with the palps.  Started the prempro for 3 days and she had sig LE edema.  She stopped it and then had a really heavy period.  She has already had an ablation in 03/2008.  Still taking Black cohash for her night sweats.  She doesn't want to try any other hormones.  She is still frustrated by her weight gain.   Review of Systems  BP 165/92  Pulse 85  Ht 5\' 11"  (1.803 m)  Wt 233 lb (105.688 kg)  BMI 32.50 kg/m2     Objective:   Physical Exam  Constitutional: She appears well-developed and well-nourished.       obese  HENT:  Head: Normocephalic and atraumatic.  Neck: Normal range of motion. Neck supple.  Cardiovascular: Normal rate, regular rhythm and normal heart sounds.   Pulmonary/Chest: Effort normal and breath sounds normal.  Musculoskeletal:       Trace ankle edema.   Skin: Skin is warm and dry.  Psychiatric: She has a normal mood and affect.       BP 192/90  Pulse 85  Ht 5\' 11"  (1.803 m)  Wt 233 lb (105.688 kg)  BMI 32.50 kg/m2    Assessment & Plan:

## 2011-03-26 NOTE — Assessment & Plan Note (Addendum)
Not well controlled. No sure why so high suddenly. Given samples of bystolic 5mg  to add to her regimen to see if gets better control.  F/u in 1 month.

## 2011-03-27 ENCOUNTER — Telehealth: Payer: Self-pay | Admitting: Family Medicine

## 2011-03-27 NOTE — Telephone Encounter (Signed)
Message copied by Nani Gasser on Wed Mar 27, 2011  1:14 PM ------      Message from: Romero Belling      Created: Wed Mar 27, 2011  6:14 AM       Ok with me            Gregary Signs                  ----- Message -----         From: Nani Gasser, MD         Sent: 03/26/2011   9:24 PM           To: Minus Breeding, MD            Hi, re: Alice Stewart, she is feeling like her hyperthyroid sx are getting worse. She is on methimazole 3x / week (though last TSH was back to normal). Are you Ok with her increasing to 4 x a week until her f/u with you next month. She doesn't have insurance so I am trying to save her a visit until then. If not OK with change then I will let her know. Thank you.

## 2011-03-27 NOTE — Telephone Encounter (Signed)
Call pt: Let her know can inc her methimazole to 4 x a week.

## 2011-03-28 NOTE — Telephone Encounter (Signed)
Pt.notified

## 2011-04-04 ENCOUNTER — Other Ambulatory Visit: Payer: Self-pay | Admitting: Family Medicine

## 2011-04-05 ENCOUNTER — Telehealth: Payer: Self-pay | Admitting: Family Medicine

## 2011-04-05 NOTE — Telephone Encounter (Signed)
Patient called to see if you received refill request from pharmacy [facility] in [place]. Methimazole 5mg  generic. Patient request to know if she can have a prescription of Bystolic 5mg  called in for Blood pressure. Pt advised Dr. Linford Arnold gave her a sample of Bystolic and it helps her. Patient also request to speak to you about another med she has but doesn't have insurance and needs it generic but couldn't understand what that med was pt request to speak to you about that one. Pt request a call once refills have been sent to pharmacy.

## 2011-04-08 NOTE — Telephone Encounter (Signed)
She can pick up samples of bystolic if would like. I think we have a lot.  Also I think she is oin Diovan and can see if have samples of that . Can refax new rx for the methimazole.

## 2011-04-09 ENCOUNTER — Other Ambulatory Visit: Payer: Self-pay | Admitting: *Deleted

## 2011-04-09 MED ORDER — METHIMAZOLE 5 MG PO TABS: ORAL_TABLET | ORAL | Status: DC

## 2011-04-09 NOTE — Telephone Encounter (Signed)
Pt notified and wil come by and pick up samples today.will send rx

## 2011-04-15 ENCOUNTER — Telehealth: Payer: Self-pay | Admitting: Family Medicine

## 2011-04-15 NOTE — Telephone Encounter (Signed)
Patient cannot afford meds, had to cancel appt due to no insurance or money, patient would like Diovan samples if possible, pls call when they become available

## 2011-04-16 ENCOUNTER — Ambulatory Visit: Payer: Self-pay | Admitting: Family Medicine

## 2011-04-18 ENCOUNTER — Ambulatory Visit (INDEPENDENT_AMBULATORY_CARE_PROVIDER_SITE_OTHER): Payer: Self-pay | Admitting: Family Medicine

## 2011-04-18 ENCOUNTER — Encounter: Payer: Self-pay | Admitting: Family Medicine

## 2011-04-18 DIAGNOSIS — I10 Essential (primary) hypertension: Secondary | ICD-10-CM

## 2011-04-18 DIAGNOSIS — N920 Excessive and frequent menstruation with regular cycle: Secondary | ICD-10-CM

## 2011-04-18 MED ORDER — MEDROXYPROGESTERONE ACETATE 10 MG PO TABS: ORAL_TABLET | ORAL | Status: DC

## 2011-04-18 NOTE — Progress Notes (Signed)
  Subjective:    Patient ID: Alice Stewart, female    DOB: 1956-10-11, 55 y.o.   MRN: [id number]  HPI  Severe cramping with her periods.  Aching down into her uppre thighs. Streaking pains.  Took Tylenol for pain and no improvement. Has been wearing baby diapers.  Had an ablation in April in 2009 and had light periods, and then slowly increased.  The last 2 months has been really heavy.  Had an endometrial bx.   Review of Systems     Objective:   Physical Exam  Constitutional: She appears well-developed and well-nourished.   I did not perform a pelvic exam today.   Assessment & Plan:  Menorrhagia-we discussed different options. For right now we continue Provera 1-2 tabs daily for 10 days to immediately stop her bleeding but I let her know that she would start to bleed again the end of this course. Hopefully would be less. Also discussed that really her only options are to consider birth control or something like the Depo-Provera shots and she still has her periods. Her current boyfriend would like to have children and she thinks that she would possibly be interested in this. She has had her tubes tied. If she is thinking about trying to get pregnant again either by having her tubes reversed or in vitro then she is certainly not a candidate for repeat ablation or possible hysterectomy. That is the options of ablation and hysterectomy are probably the best for relieving her menorrhagia. Fortunately she has a rehabilitation an endometrial biopsy in February which was negative for uterine cancer.

## 2011-04-18 NOTE — Patient Instructions (Signed)
Consider the Depo shot.

## 2011-04-18 NOTE — Assessment & Plan Note (Signed)
Poorly controlled. Patient is currently out of her medications as she cannot afford them. And she has so many sensitivities to several blood pressure medications but really truly she cannot take it to that she is supposed to be taking. Also because she doesn't have insurance we cannot refer her to cardiology for further consult.

## 2011-04-24 NOTE — Telephone Encounter (Signed)
Meds/samples discussed at visit on 04/18/11.  Follow up ended.

## 2011-05-06 ENCOUNTER — Ambulatory Visit: Payer: Self-pay | Admitting: Family Medicine

## 2011-05-07 NOTE — Assessment & Plan Note (Signed)
NAMEELISABETH, Alice Stewart NO.:  [id number]   MEDICAL RECORD NO.:  [id number]          PATIENT TYPE:  POB   LOCATION:  [facility]         FACILITY:  [facility]   PHYSICIAN:  Elsie Lincoln, MD      DATE OF BIRTH:  1956-06-28   DATE OF SERVICE:  12/25/2010                                  CLINIC NOTE   The patient is a 55 year old female who presents to me after  approximately two and half years.  This patient underwent a D and C,  hydrothermal ablation for menorrhagia.  The patient's menorrhagia  subsided.  She, over the past year, began having what she believes the  beginning of menopausal symptoms and skipping menses, which can be  normal.  She sometimes has a period once a month or sometimes every  other month.  Sometimes she will skip as many as 2 months but mostly  does not skip that long of a time.  She is having hot flashes at night.  She is very irritable.  She also feels like she has pelvic bloating and  vaginal dryness.  Most likely this is the beginning of menopause, but we  need to be sure given that this irregular menses has been going on for  approximately a year.  The patient moved out of the area for the past  year, so got care in the [place].  She states her last Pap smear  and mammogram were normal in September.  The patient is still on  multiple herbs, which is on the current medical list.   PAST MEDICAL HISTORY:  High cholesterol, high blood pressure, and some  type of thyroid problem.   PAST SURGICAL HISTORY:  Heel spur, left foot surgery, tubal ligation,  right for surgery.   GYNECOLOGIC HISTORY:  History of ovarian cyst that resolved on 2009  ultrasound, which is a dominant follicle.  No history of abnormal Pap  smears.  Menorrhagia which was relieved with ablation, now menses as  above.   MEDICATIONS:  Diovan, multivitamin, black cohosh, evening primrose oil,  valerian, herbal teas, black clover, calcium and vitamin D, vitamin  C,  Ginkgo biloba.   ALLERGIES:  None.   PHYSICAL EXAMINATION:  VITAL SIGNS:  Pulse 68, blood pressure 156/83,  weight 220, height 64-3/4.  GENERAL:  Well nourished, well developed, in no apparent stress.  HEENT:  Normocephalic and atraumatic.  CHEST:  Normal movement.  ABDOMEN:  Soft, nontender.  No rebound, no guarding.  GENITALIA:  Tanner V.  Vagina slightly less pink than before.  Cervix  closed, nontender.  Uterus anteverted, nontender.  Adnexa, no masses  felt.   ASSESSMENT AND PLAN:  A 55 year old female probably going through  premenopausal symptoms.  1. FSH.  2. The patient needs her thyroid hormone checked, we will check that      today.  3. Transvaginal ultrasound for bloating, also evaluate ovaries and      uterus.  4. Endometrial biopsy.           ______________________________  Elsie Lincoln, MD     KL/MEDQ  D:  12/25/2010  T:  12/26/2010  Job:  [id number]

## 2011-05-07 NOTE — Op Note (Signed)
Alice Stewart, HINDS NO.:  [id number]   MEDICAL RECORD NO.:  [id number]          PATIENT TYPE:  AMB   LOCATION:  SDC                           FACILITY:  [facility]   PHYSICIAN:  Lesly Dukes, M.D. DATE OF BIRTH:  1956/01/09   DATE OF PROCEDURE:  04/05/2008  DATE OF DISCHARGE:                               OPERATIVE REPORT   PREOPERATIVE DIAGNOSIS:  A 55 year old female with menometrorrhagia  unresponsive to medications with polyp on biopsy.   POSTOPERATIVE DIAGNOSIS:  A 55 year old female with menometrorrhagia  unresponsive to medications with polyp on biopsy.   PROCEDURE:  1. Dilation.  2. Hysteroscopy.  3. HydroThermAblation.   SURGEON:  Elsie Lincoln, MD.   ANESTHESIA:  MAC.   FINDINGS:  Clot in the left cornu of the uterus, otherwise large cavity  with fluffy endometrium.   SPECIMENS:  Endometrial curettings to pathology.   EBL:  Minimal.   COMPLICATIONS:  None.   PROCEDURE:  After informed consent was obtained the patient taken to  operating room where MAC anesthesia was introduced.  The patient had a  difficult time remaining under anesthesia per the anesthetist and  required increased agents.  This is consistent with the last time she  had general anesthesia and we will tell the patient about this as well.  Once the anesthesia was found to be adequate, the patient was placed in  dorsal lithotomy position and prepared and draped in normal sterile  fashion.  The bladder was emptied with a Foley catheter.  A bivalve  speculum was placed into the patient's vagina and the cervix was brought  into view.  The anterior lip of the cervix was grasped with a single-  tooth tenaculum.  The cervical os was dilated with Hegar dilator to #8.  A D&C was performed as well as hysteroscopy.  There was no polyp that  could be seen, however, there was some dark black subcentimeter areas in  the endometrium that looked like endometriosis implants and there was  also a large clot in the left cornu and this was removed with D&C.  Again, there was no polyp, that I could see, to remove.  Ablation was  performed with the cavity filling to 80 mL with oozing of fluid  according to the machine; however, there was 1 or 2 mL of fluid in the  vagina that was cool to touch mid way through the procedure.  There was  no active leaking of the fluid from the cervix, so we continued with the  procedure and no other fluid appeared in the vagina.  At the end of the  procedure it  looked like the endometrium was well charred and all instruments removed  from patient's vagina, there was good hemostasis on the cervix, the  patient tolerated the procedure well.  Sponge, lap, instrument and  needle count were correct x2 and patient went to the recovery room in  stable condition.      Lesly Dukes, M.D.  Electronically Signed     KHL/MEDQ  D:  04/05/2008  T:  04/05/2008  Job:  [id number]

## 2011-05-07 NOTE — Assessment & Plan Note (Signed)
Alice Stewart, CASAVANT NO.:  [id number]   MEDICAL RECORD NO.:  [id number]          PATIENT TYPE:  POB   LOCATION:  [facility] at [place]         FACILITY:  [facility]   PHYSICIAN:  Elsie Lincoln, MD      DATE OF BIRTH:  [birth year]   DATE OF SERVICE:  01/02/2011                                  CLINIC NOTE   The patient is a 55 year old female who presents for endometrial biopsy.  The patient had a watery discharge.  Wet prep was repeated.  Informed  consent was obtained.  Urine pregnancy test was negative.  The patient  was placed in dorsal lithotomy position.  The cervix cleaned, anterior  lip was grasped with a single-tooth tenaculum, 3 pass with endometrial  Pipelle was obtained.  The uterus sounded to 7 cm.  There was very scant  tissue.  We also reviewed the results of the ultrasound, her right and  left ovary were normal.  The endometrium was difficult to see  transvaginally probably secondary to the history of ablation, but it  measured 12 mm.  Her TSH was low and free T3 and T4 was drawn today.   ASSESSMENT AND PLAN:  49. A 54 year old female with perimenopausal.  2. FSH correlates with menopause.  3. Endometrial biopsy today for abnormal bleeding.  4. Return to clinic in 2 weeks.           ______________________________  Elsie Lincoln, MD     KL/MEDQ  D:  01/02/2011  T:  01/03/2011  Job:  [id number]

## 2011-05-07 NOTE — Assessment & Plan Note (Signed)
NAMEMARTIN, SMEAL NO.:  [id number]   MEDICAL RECORD NO.:  [id number]          PATIENT TYPE:  POB   LOCATION:  [facility] [place]         FACILITY:  [facility]   PHYSICIAN:  Elsie Lincoln, MD      DATE OF BIRTH:  [birth year]   DATE OF SERVICE:  03/02/2008                                  CLINIC NOTE   The patient is a 55 year old G4, para 3-0-1-3 who has had normal menses  up until January 2009.  At that point, she has had 3 months of heavy  bleeding and spotting, and then 6 weeks with nothing which is very  abnormal for her.  Her hemoglobin is 10.1 which shows that she is  anemic.  She was sent to [place] from family practice.  The patient has a lot  of associated cramping.  She said she may have had a fibroid interiorly  diagnosed on bimanual examination several years ago, but never has had  an ultrasound.   PAST MEDICAL HISTORY:  High cholesterol, high blood pressure and a  thyroid problem.   PAST SURGICAL HISTORY:  Heel spur, left foot, tubal ligation, right foot  surgery on the joint on the big toe.   GYN HISTORY:  No history of abnormal Pap smears.  No history of ovarian  cyst.  Positive history of fibroid as above.  Date of last mammogram  2005.  The patient cannot afford to have one right now.  I had her apply  for a mammogram scholarship.   SOCIAL HISTORY:  The patient drinks 1 to 2 drinks per week, 4  caffeinated beverages a day.  She does not have a history of being  physically abused.   SYSTEMIC REVIEW:  Positive for numbness, muscle aches, fever, night  sweats, weight gain.  Says she still has a problem with vision, coughing  up phlegm, nausea, loss of urine with coughing and sneezing, vaginal  odor, vaginal itching and vaginal bleeding.  We are addressing the  vaginal bleeding today.   MEDICATIONS:  1. Provera 10 mg.  2. Avalide.  3. Allegra.  4. Promensil which is red clover leaf extract.  5. Gardiner Ramus.  6. Calcium plus vitamin D.  7.  Flonase.  8. Multivitamin.  9. Midrin.  10.Aspirin.  11.DynaCirc.  12.Metronidazole.   The patient wants to stop the Provera after the 2 weeks and go on an  herbal supplement she is going to order from [place].  I told her that  the patient can do that, but I am unfamiliar with any herbal medications  that actually control bleeding.   ALLERGIES:  None.   PHYSICAL EXAMINATION:  Pulse 91, blood pressure 134/74, weight 209,  height 64-3/4 inches.  GENERAL:  Well nourished, well developed, no apparent distress.  ABDOMEN:  Soft, nontender.  GENITALIA:  Tanner 5, stained with blood.  Urethra and vagina appear  slightly pale.  Uterus is grade 1 prolapse.  Cervix closed, nontender.  Uterus has an anterior fibroid possibly extending to the right.  Difficult to tell the end of the uterus from the beginning of the ovary.  Left ovary, no masses, nontender.   PROCEDURE:  The patient  was consented for endometrial biopsy.  Urine  preg was negative.  Used sterile technique.  An endometrial biopsy was  done with 2 passes.  The uterus sounded approximately 9.5 cm.  There was  good hemostasis at the end of the procedure.   ASSESSMENT AND PLAN:  A 55 year old female with menometrorrhagia and  anemia:  1. Endometrial biopsy.  2. Transvaginal ultrasound to evaluate her ovaries and uterus.  3. Mammogram scholarship.  4. Return to clinic in 2 weeks.           ______________________________  Elsie Lincoln, MD     KL/MEDQ  D:  03/02/2008  T:  03/02/2008  Job:  [id number]   cc:   Nani Gasser, MD

## 2011-05-07 NOTE — Assessment & Plan Note (Signed)
Alice Stewart, Alice Stewart NO.:  [id number]   MEDICAL RECORD NO.:  [id number]          PATIENT TYPE:  POB   LOCATION:  [facility] [place]         FACILITY:  [facility]   PHYSICIAN:  Elsie Lincoln, MD      DATE OF BIRTH:  1956/07/15   DATE OF SERVICE:  04/27/2008                                  CLINIC NOTE   Patient is a 55 year old female, who underwent D and C, hydrothermal  ablation, approximately three weeks ago.  Patient has had a little bit  of cramping, a little bit of discharge, but no bleeding.  Given that she  is still ovulating, this abdominal cramping could be normal for her.  She is starting to have hot flashes.  The hope is that she will go into  menopause soon.  She is still on a tremendous amount of herbs, which she  will continue for whatever ails her.   She does need a mammogram and she does not want to pay the 20% that her  insurance pays, so she is waiting for mammogram scholarship.  She is  also due for a Pap smear in August 2009, and Dr. Linford Arnold will do that.   I will have the patient call me if there are any problems.  However,  hopefully she will be amenorrheic until she reaches menopause.           ______________________________  Elsie Lincoln, MD     KL/MEDQ  D:  04/27/2008  T:  04/27/2008  Job:  [id number]

## 2011-05-07 NOTE — Assessment & Plan Note (Signed)
Alice Stewart, HELIN NO.:  [id number]   MEDICAL RECORD NO.:  [id number]          PATIENT TYPE:  POB   LOCATION:  [facility] [place]         FACILITY:  [facility]   PHYSICIAN:  Elsie Lincoln, MD      DATE OF BIRTH:  Feb 10, 1956   DATE OF SERVICE:                                  CLINIC NOTE   Patient is a 55 year old female with anemia, abnormal uterine bleeding  who is here for follow up test results.  The patient has a 3 cm simple  right ovarian cyst with thickened endometrium with no fibroids or  adenomyosis and does have polypoid fragments on her endometrial biopsy.  The patient has been on Provera and not really bleeding right now, which  is good because she is still on iron to help her anemia.  Given that she  does have a polyp in her thickened endometrium, I believe that she would  benefit from a D and C polypectomy and a hydrothermablation.  Patient  understands that she will no longer able to have kids; however she has  already had a tubal and this is not an issue for her.  We discussed the  risks of the procedure including bleeding, infection, damage to the back  of the uterus.  If the back of the uterus is damaged, the procedure  would have to be abandoned.  The risk of these seen is about 1%.  The  patient still needs to get her mammogram.  We will schedule her shortly  and hopefully relieve her of her bleeding and cramping.           ______________________________  Elsie Lincoln, MD     KL/MEDQ  D:  03/09/2008  T:  03/09/2008  Job:  [id number]

## 2011-05-23 ENCOUNTER — Ambulatory Visit: Payer: Self-pay | Admitting: Endocrinology

## 2011-05-24 ENCOUNTER — Ambulatory Visit (INDEPENDENT_AMBULATORY_CARE_PROVIDER_SITE_OTHER): Payer: Self-pay | Admitting: Family Medicine

## 2011-05-24 ENCOUNTER — Telehealth: Payer: Self-pay | Admitting: Family Medicine

## 2011-05-24 VITALS — BP 151/82 | HR 73 | Temp 98.0°F | Wt 235.0 lb

## 2011-05-24 DIAGNOSIS — I10 Essential (primary) hypertension: Secondary | ICD-10-CM

## 2011-05-24 DIAGNOSIS — M549 Dorsalgia, unspecified: Secondary | ICD-10-CM

## 2011-05-24 DIAGNOSIS — E0590 Thyrotoxicosis, unspecified without thyrotoxic crisis or storm: Secondary | ICD-10-CM

## 2011-05-24 DIAGNOSIS — N898 Other specified noninflammatory disorders of vagina: Secondary | ICD-10-CM

## 2011-05-24 LAB — WET PREP FOR TRICH, YEAST, CLUE: Clue Cells Wet Prep HPF POC: NONE SEEN

## 2011-05-24 MED ORDER — CHLORTHALIDONE 50 MG PO TABS: ORAL_TABLET | ORAL | Status: DC

## 2011-05-24 MED ORDER — METHIMAZOLE 5 MG PO TABS: ORAL_TABLET | ORAL | Status: DC

## 2011-05-24 NOTE — Assessment & Plan Note (Signed)
I did refill her medications today. I will follow with Dr. Everardo All in this and she is able to get some coverage with her insurance. I think it is crucial for him to adjust her medications as this is his specialty area. I did encourage her to continue regular exercise and keep walking for her general overall health. I did not draw labs today as she does not have insurance.

## 2011-05-24 NOTE — Telephone Encounter (Signed)
Call pt: Wet prep is negative.

## 2011-05-24 NOTE — Assessment & Plan Note (Signed)
I strongly encouraged her to continue with her exercises and keep using her Tylenol as needed until she gets insurance and is able to either get further evaluation or possibly physical therapy.

## 2011-05-24 NOTE — Progress Notes (Signed)
  Subjective:    Patient ID: Alice Stewart, female    DOB: 1956-10-31, 55 y.o.   MRN: [id number]  HPI  She originally had an appointment to followup for her thyroid yesterday. She still does not have insurance and so is unable to make it. She still feels symptomatic from her thyroid. She complains of  throbbing in her neck and says it keeps her up at night. She says she has been taking the methimazole 4 days per week. She wonders if this is enough. She does need a refill she recently ran out.  Still having a lot of back pain in her neck and shoulders. Tyelnol for that and helps some. Says she is still doing her stretches at home.  Right now she really can't do any further evaluation or see a specialist because she doesn't have insurance.  She feels really swollen all over.  She feels like over the last couple weeks she started retaining some fluid. She reports that she has been taking her Diovan regularly. We try give her samples when we can because it's very expensive and she has tried multiple blood pressure medications in the past which she feels she has not tolerated. She also continues to gain weight.  Review of Systems     Objective:   Physical Exam        Assessment & Plan:

## 2011-05-24 NOTE — Assessment & Plan Note (Addendum)
Not well controlled. Will add chlorthalidone. I recommended that she start with a half of a tab daily to see how she feels fine. If she tolerates it well then she can increase to a whole tab. She also complains of headache and nausea on medications but these are also constant complaints that she has even when she is not on blood pressure medications. F/U in one mo. If not working well then will consider atenolol, as this could also help with the palpitations she is experiencing. Will need to check the old chart to see if has taken before.

## 2011-05-24 NOTE — Patient Instructions (Signed)
Take one of the Diovan HCT  160/25 plus half tab of the diovan 320mg  each day.  We will call you with the wet prep results.

## 2011-05-27 NOTE — Telephone Encounter (Signed)
Pt.notified

## 2011-05-31 ENCOUNTER — Telehealth: Payer: Self-pay | Admitting: Family Medicine

## 2011-05-31 ENCOUNTER — Encounter: Payer: Self-pay | Admitting: *Deleted

## 2011-05-31 MED ORDER — TRAMADOL HCL 50 MG PO TABS: 50.0000 mg | ORAL_TABLET | Freq: Three times a day (TID) | ORAL | Status: DC | PRN

## 2011-05-31 NOTE — Telephone Encounter (Signed)
Patient called and left a message stating she was in pain with her arthritis that is really bothering her in her back and shoulders... Patient states that Dr. Linford Arnold is aware of her Arthritis and needs to know what to do for the intense pain? Call patient back at [phone] and advise her. Thanks

## 2011-05-31 NOTE — Telephone Encounter (Signed)
I will send over some tramadol.  She really can't take NSAID as her BP is high.

## 2011-05-31 NOTE — Telephone Encounter (Signed)
Addended by: Nani Gasser D on: 05/31/2011 05:45 PM   Modules accepted: Orders

## 2011-05-31 NOTE — Telephone Encounter (Signed)
Routed to Dr. Linford Arnold for evaluation . Alice Newcomer, LPN Domingo Dimes

## 2011-06-04 ENCOUNTER — Telehealth: Payer: Self-pay | Admitting: Family Medicine

## 2011-06-04 NOTE — Telephone Encounter (Signed)
Called Alice Stewart and verified she had gotten her tramadol prescription.  Alice Stewart had gotten the tramadol. Jarvis Newcomer, LPN Domingo Dimes

## 2011-06-04 NOTE — Telephone Encounter (Signed)
Pt taken care of.

## 2011-06-11 ENCOUNTER — Ambulatory Visit: Payer: Self-pay | Admitting: Endocrinology

## 2011-06-24 ENCOUNTER — Ambulatory Visit: Payer: Self-pay | Admitting: Family Medicine

## 2011-06-27 ENCOUNTER — Encounter: Payer: Self-pay | Admitting: Family Medicine

## 2011-06-27 ENCOUNTER — Ambulatory Visit (INDEPENDENT_AMBULATORY_CARE_PROVIDER_SITE_OTHER): Payer: Self-pay | Admitting: Family Medicine

## 2011-06-27 VITALS — BP 173/96 | HR 88 | Ht 64.0 in | Wt 241.0 lb

## 2011-06-27 DIAGNOSIS — I10 Essential (primary) hypertension: Secondary | ICD-10-CM

## 2011-06-27 DIAGNOSIS — E0590 Thyrotoxicosis, unspecified without thyrotoxic crisis or storm: Secondary | ICD-10-CM

## 2011-06-27 DIAGNOSIS — M2550 Pain in unspecified joint: Secondary | ICD-10-CM

## 2011-06-27 MED ORDER — DULOXETINE HCL 30 MG PO CPEP: 30.0000 mg | ORAL_CAPSULE | Freq: Every day | ORAL | Status: DC

## 2011-06-27 MED ORDER — ALISKIREN-VALSARTAN 150-160 MG PO TABS: 150.0000 mg | ORAL_TABLET | Freq: Every day | ORAL | Status: DC

## 2011-06-27 NOTE — Assessment & Plan Note (Signed)
Hypertension is very poorly controlled. I gave her some more samples of Diovan but I didn't have the strength that she was on. Also I gave her samples of Valturna, a lower dose to see if she tolerates this. I asked that she start with a half a tab once a day opposite of her Diovan. Very concerned about her significantly elevated blood pressure but we have had so many medications and she doesn't seem to tolerate him. When she gets insurance I let her go back and see cardiology to see if they have any other recommendations for her.

## 2011-06-27 NOTE — Progress Notes (Signed)
  Subjective:    Patient ID: Alice Stewart, female    DOB: 1956/11/13, 55 y.o.   MRN: [id number]  HPI Still have back, neck and shoulder pain. Pain has gotten worse.  Feels ache all over.  Thinks she has numbness inher hands and fingers and radiates up her hands.  Her knees, ankle and hips are very sore as well.  Occ HA and nausea. Tylenol doesn't help.  Times worse than others.  Whole back hurst on both sides.  Numbness in hands is worse with driving. She is worried she is having fibromyalgia.   Mom had MS and she is worried about this.   HTN - Tried the chlorthalidone and said it gave nausea and severe HA.  Says has been taking the diovan samples I gave her but they are lower dose than what she normally takes.  Thus far this is the only medication that she's been able tolerate. No chest pain or shortness of breath. She does still have intermittent nausea and headaches frequently in with the Diovan.   Review of Systems     Objective:   Physical Exam  Constitutional: She is oriented to person, place, and time. She appears well-developed and well-nourished.  Cardiovascular: Normal rate, regular rhythm and normal heart sounds.   Pulmonary/Chest: Effort normal and breath sounds normal.  Musculoskeletal:       Trace LE edema. DP pulses 2+ bilaterally.   Neurological: She is alert and oriented to person, place, and time.  Skin: Skin is warm and dry.  Psychiatric: She has a normal mood and affect.          Assessment & Plan:  Polyathralgia and myalgias-She certainly could have fibromyalgia. We will try to get her in with Dr. Titus Stewart. Will see if takes cone insurance.

## 2011-06-27 NOTE — Assessment & Plan Note (Signed)
She does have an upcoming appointment with Dr. Everardo All. I think some of her symptoms are not necessarily related to her thyroid. She does seem to be gaining weight.

## 2011-07-05 ENCOUNTER — Other Ambulatory Visit: Payer: Self-pay | Admitting: Obstetrics & Gynecology

## 2011-07-05 DIAGNOSIS — Z1231 Encounter for screening mammogram for malignant neoplasm of breast: Secondary | ICD-10-CM

## 2011-07-09 ENCOUNTER — Other Ambulatory Visit (INDEPENDENT_AMBULATORY_CARE_PROVIDER_SITE_OTHER): Payer: Self-pay

## 2011-07-09 ENCOUNTER — Encounter: Payer: Self-pay | Admitting: Endocrinology

## 2011-07-09 ENCOUNTER — Ambulatory Visit (INDEPENDENT_AMBULATORY_CARE_PROVIDER_SITE_OTHER): Payer: Self-pay | Admitting: Endocrinology

## 2011-07-09 VITALS — BP 138/82 | HR 88 | Temp 98.4°F | Ht 64.0 in | Wt 236.0 lb

## 2011-07-09 DIAGNOSIS — E0590 Thyrotoxicosis, unspecified without thyrotoxic crisis or storm: Secondary | ICD-10-CM

## 2011-07-09 NOTE — Patient Instructions (Addendum)
blood tests are being ordered for you today.  please call [phone] to hear your test results.  You will be prompted to enter the 9-digit "MRN" number that appears at the top left of this page, followed by #.  Then you will hear the message. Please make a follow-up appointment in 3 months. (update: i left message on phone-tree:  rx as we discussed)

## 2011-07-09 NOTE — Progress Notes (Signed)
Subjective:    Patient ID: Alice Stewart, female    DOB: 12-Apr-1956, 55 y.o.   MRN: [id number]  HPI Pt is here to f/u hyperthyroidism.  She chose tapazole rx, but she takes it qiw.  She has a few mos of slight swelling in the legs, pulsating sensation in the head, and weight gain Past Medical History  Diagnosis Date  . Thyroid disorder   . Hyperthyroidism   . Benign positional vertigo   . Anemia   . Menorrhagia   . Neck pain, acute   . Menopausal disorder   . Back pain   . Obesity   . Carpal tunnel syndrome   . Muscle spasm     trapezius  . Migraine   . Insulin resistance syndrome   . Goiter     nontoxid mutinodular  . Seborrheic dermatitis   . Hypertension     No past surgical history on file.  History   Social History  . Marital Status: Married    Spouse Name: N/A    Number of Children: 3  . Years of Education: 14   Occupational History  . [employer]   Social History Main Topics  . Smoking status: Former Smoker    Quit date: 09/22/2004  . Smokeless tobacco: Not on file  . Alcohol Use: 0.5 oz/week    1 drink(s) per week     1 weekly  . Drug Use: No  . Sexually Active:    Other Topics Concern  . Not on file   Social History Narrative   3-4 caffeinated drinks per dayNo regular exercise    Current Outpatient Prescriptions on File Prior to Visit  Medication Sig Dispense Refill  . Aliskiren-Valsartan 150-160 MG TABS Take 150 mg by mouth daily.  14 tablet  0  . methimazole (TAPAZOLE) 5 MG tablet Take one tablet by mouth 4 times weekly. For your thyroid.  30 tablet  2  . traMADol (ULTRAM) 50 MG tablet Take 1 tablet (50 mg total) by mouth every 8 (eight) hours as needed for pain.  60 tablet  0  . Valerian Root 450 MG CAPS Take 1 capsule by mouth at bedtime.        . valsartan-hydrochlorothiazide (DIOVAN-HCT) 320-25 MG per tablet Take 1 tablet by mouth daily.        Marland Kitchen VITAMIN D, ERGOCALCIFEROL, PO Take by mouth.        . DULoxetine (CYMBALTA) 30 MG  capsule Take 1 capsule (30 mg total) by mouth daily.  21 capsule  0    Allergies  Allergen Reactions  . Bystolic (Nebivolol Hcl) Other (See Comments)    Nausea.   . Caduet     REACTION: salty taste in mouth, stomach burning, H/A  . Chlorthalidone Nausea Only    Headache  . Diltiazem Hcl     REACTION: Dizzy  . Hydrochlorothiazide     REACTION: increase urination  . Lisinopril     REACTION: backache  . Metoprolol Tartrate     REACTION: nausea,HA, dizziness  . Simvastatin     REACTION: muscle aches    Family History  Problem Relation Age of Onset  . Other Mother     MS  . Hypertension Father   . Hypothyroidism Sister   . Diabetes Other     BP 138/82  Pulse 88  Temp(Src) 98.4 F (36.9 C) (Oral)  Ht 5\' 4"  (1.626 m)  Wt 236 lb (107.049 kg)  BMI 40.51 kg/m2  SpO2 94%  LMP 05/24/2011    Review of Systems Denies fever.  She has hair loss.    Objective:   Physical Exam GENERAL: no distress.  Obese. Neck: i do not appreciate a goiter or nodule.    Lab Results  Component Value Date   TSH 2.30 07/09/2011     Assessment & Plan:  Hyperthyroidism, well-controlled.  She only needs to take tapazole tiw

## 2011-07-10 ENCOUNTER — Other Ambulatory Visit: Payer: Self-pay | Admitting: Family Medicine

## 2011-07-10 LAB — T4, FREE: Free T4: 0.62 ng/dL (ref 0.60–1.60)

## 2011-07-11 ENCOUNTER — Telehealth: Payer: Self-pay | Admitting: Family Medicine

## 2011-07-11 NOTE — Telephone Encounter (Signed)
Can you call the drug representative and see if they can give her some samples.

## 2011-07-11 NOTE — Telephone Encounter (Signed)
Pt notified that samples were found and she can come pick them up for a month worth. Jarvis Newcomer, LPN Domingo Dimes

## 2011-07-11 NOTE — Telephone Encounter (Signed)
Pt called yesterday and needed samples of the aliskiren-valsartan medication 150-160, but when the sample closet was checked, there wasn't any samples found.  Pt said she has not medication to take tonight and the med was working really well and keeping her BP down.  Wanted [place] to make a note and let the provider know. Plan:  Routed mess to provider and also informed pt to go online to the company that makes the medication and see if they have a pt assistance program they should could sign up for and get the medication since she says she cannot afford a script at the current time. Jarvis Newcomer, LPN Domingo Dimes

## 2011-07-11 NOTE — Telephone Encounter (Signed)
Closed

## 2011-07-12 ENCOUNTER — Other Ambulatory Visit: Payer: Self-pay | Admitting: Family Medicine

## 2011-07-12 ENCOUNTER — Telehealth: Payer: Self-pay | Admitting: *Deleted

## 2011-07-12 MED ORDER — ALISKIREN-VALSARTAN 150-160 MG PO TABS: ORAL_TABLET | ORAL | Status: DC

## 2011-07-12 NOTE — Telephone Encounter (Signed)
Left message for pt to callback office.  

## 2011-07-12 NOTE — Telephone Encounter (Signed)
Yes, please continue 3x/week

## 2011-07-12 NOTE — Telephone Encounter (Signed)
Pt informed of MD's advisement. 

## 2011-07-12 NOTE — Telephone Encounter (Signed)
Lab results normal. Pt inquiring as to if she is to continue taking Tapazole 5 mg?

## 2011-07-16 ENCOUNTER — Telehealth: Payer: Self-pay | Admitting: *Deleted

## 2011-07-16 ENCOUNTER — Ambulatory Visit
Admission: RE | Admit: 2011-07-16 | Discharge: 2011-07-16 | Disposition: A | Discharge status: Home / Self Care | Payer: Self-pay | Source: Ambulatory Visit | Attending: Obstetrics & Gynecology | Admitting: Obstetrics & Gynecology

## 2011-07-16 DIAGNOSIS — Z1231 Encounter for screening mammogram for malignant neoplasm of breast: Secondary | ICD-10-CM

## 2011-07-16 DIAGNOSIS — M797 Fibromyalgia: Secondary | ICD-10-CM

## 2011-07-16 NOTE — Telephone Encounter (Signed)
There is not blood work for fibromyalgia. It is what is called a "clinical " dx. Usually I send people to derm to get an eval to determine if they have fibro.

## 2011-07-16 NOTE — Telephone Encounter (Signed)
She said bld work to confirm fibromyalgia

## 2011-07-16 NOTE — Telephone Encounter (Signed)
Pt notified and she states she wants a refer to neuro that is associated with [facility]

## 2011-07-16 NOTE — Telephone Encounter (Signed)
I guess.  Not really sure what this has to do with disability?  What kind of labs does she want?

## 2011-07-16 NOTE — Telephone Encounter (Signed)
Pt  Cam e in the office and states she is trying to get disability for fibromyalgia. Pt states she wants to know if you can give her an order so that she can have bld work done so that she can get the results sent in when she applies for diability before Aug 6

## 2011-07-17 NOTE — Telephone Encounter (Signed)
Sorry, we usuall sent them to rheum. My note said derm.Marland KitchenMarland KitchenDoes she want to see rheum?

## 2011-07-17 NOTE — Telephone Encounter (Signed)
Addended by: Nani Gasser D on: 07/17/2011 12:50 PM   Modules accepted: Orders

## 2011-07-17 NOTE — Telephone Encounter (Signed)
Yes rheum, I was thinking neuro.

## 2011-07-18 ENCOUNTER — Telehealth: Payer: Self-pay | Admitting: *Deleted

## 2011-07-18 NOTE — Telephone Encounter (Signed)
Pt callled and wantes to know if there is another specialist other than rheum that would dx fibromyalgia because there is not a rheum within [facility]

## 2011-07-18 NOTE — Telephone Encounter (Signed)
No rehum is the specialist to dx this.

## 2011-07-18 NOTE — Telephone Encounter (Signed)
Pt given the name of Dr Corliss Skains in [place]. Pt to call and find out cost and payment plan info. Will contact us if she wants a referral.

## 2011-07-25 ENCOUNTER — Encounter: Payer: Self-pay | Admitting: *Deleted

## 2011-07-25 NOTE — Progress Notes (Signed)
Imaging is waiting for old breast films to compare to recent mammogram.  Pt has appt with Dr Penne Lash 08/06/11 at 1:30

## 2011-07-30 ENCOUNTER — Encounter: Payer: Self-pay | Admitting: Family Medicine

## 2011-07-30 ENCOUNTER — Ambulatory Visit (INDEPENDENT_AMBULATORY_CARE_PROVIDER_SITE_OTHER): Payer: Self-pay | Admitting: Family Medicine

## 2011-07-30 DIAGNOSIS — M2550 Pain in unspecified joint: Secondary | ICD-10-CM

## 2011-07-30 DIAGNOSIS — I10 Essential (primary) hypertension: Secondary | ICD-10-CM

## 2011-07-30 DIAGNOSIS — R635 Abnormal weight gain: Secondary | ICD-10-CM

## 2011-07-30 MED ORDER — TRAMADOL HCL 50 MG PO TABS: 50.0000 mg | ORAL_TABLET | Freq: Four times a day (QID) | ORAL | Status: AC | PRN

## 2011-07-30 NOTE — Patient Instructions (Signed)
Can try the tramadol plus tylenol to see the response.

## 2011-07-30 NOTE — Progress Notes (Signed)
  Subjective:    Patient ID: Alice Stewart, female    DOB: Oct 20, 1956, 55 y.o.   MRN: [id number]  HPI  Still having some palpitations. Still having pain in her legs that also starts in her low back and radiates down into her legs. She says occasionally she will get a sharp pain in both knees when this happens. This can be at rest and with activity. Says the tramadol does help some but not enough. She does not work at all for her. Saw disablity MD yesterday.  Did xrays of the neck, hips, and low back exercise imaging.  Has a lot of stinging and tingling pain mostly in the neck and low back back. Sleep quality if fair.  She says sometimes she sleeps well but other nights she wakes up after a few hours and then can go back to sleep and rest tonight. She occasionally will use a muscle relaxer when this happens and says it does not help her get back to sleep but then she feels groggy the next day. She feels tender in her upper extremity and lower extremities  Seh is really still struggling with her weight gain.  She admits that she cannot stick to a diet but overall feels that she eats really healthy and has tried exercising and feels it has not worked. She is not currently exercising.  Dr. Everardo All dec her methimazole to 3 x a week.     She discontinued the Cymbalta because she said she didn't like how it made her feel. Review of Systems     Objective:   Physical Exam  Constitutional: She is oriented to person, place, and time. She appears well-developed and well-nourished.  HENT:  Head: Normocephalic and atraumatic.  Cardiovascular: Normal rate, regular rhythm and normal heart sounds.   Pulmonary/Chest: Effort normal and breath sounds normal.  Musculoskeletal:       Trace ankle edema bilaterally  Neurological: She is alert and oriented to person, place, and time.  Skin: Skin is warm and dry.  Psychiatric: She has a normal mood and affect. Her behavior is normal.          Assessment &  Plan:  Weight gain - Discussed not a candidate for weight loss meds for now. Discussed that the best option would be for her to work with a nutritionist. Before considering bariatric surgery. She says she will think about this.  Diffuse muscular  skeletal pain-I do think that she could have fibromyalgia. I would like to see the report from the disability doctor as well as her x-rays. She did approve for those copies to be sent to her office. Consider therapy is appropriate for fibromyalgia. I did discuss with her the importance of regular exercise.

## 2011-07-30 NOTE — Assessment & Plan Note (Addendum)
Her blood pressure regimen is not optimal but she has had for many side effects from 70 different blood pressure medications that I am really a loss of what additional to add. At this point time she is on Valturna adn Diovan HCT and doing much better. I think dietary salt intake as a big effect on her blood pressure as well as her weight gain over the last year. I would like to see her exercising more regularly as I think this would make a big difference in her blood pressure control.

## 2011-08-01 ENCOUNTER — Telehealth: Payer: Self-pay | Admitting: Family Medicine

## 2011-08-01 DIAGNOSIS — E669 Obesity, unspecified: Secondary | ICD-10-CM

## 2011-08-01 NOTE — Telephone Encounter (Signed)
Addended by: Nani Gasser D on: 08/01/2011 04:30 PM   Modules accepted: Orders

## 2011-08-01 NOTE — Telephone Encounter (Signed)
REf entered.

## 2011-08-01 NOTE — Telephone Encounter (Signed)
Patient is requesting a referral for Nutrtional Therapy with [facility] as you and her discussed. Thanks, [employer]

## 2011-08-06 ENCOUNTER — Ambulatory Visit (INDEPENDENT_AMBULATORY_CARE_PROVIDER_SITE_OTHER): Payer: Self-pay | Admitting: Obstetrics & Gynecology

## 2011-08-06 ENCOUNTER — Encounter: Payer: Self-pay | Admitting: Obstetrics & Gynecology

## 2011-08-06 VITALS — BP 148/87 | HR 87 | Temp 98.3°F | Resp 16 | Wt 238.0 lb

## 2011-08-06 DIAGNOSIS — Z0000 Encounter for general adult medical examination without abnormal findings: Secondary | ICD-10-CM

## 2011-08-06 DIAGNOSIS — Z1272 Encounter for screening for malignant neoplasm of vagina: Secondary | ICD-10-CM

## 2011-08-06 MED ORDER — FLUCONAZOLE 100 MG PO TABS: 150.0000 mg | ORAL_TABLET | Freq: Every day | ORAL | Status: DC

## 2011-08-06 NOTE — Patient Instructions (Signed)
We will follow up on your mammogram and call you.  If you do not hear from [place] in 3 days, please call the office.    Colonoscopy A colonoscopy is an exam to evaluate your entire colon. In this exam, your colon is cleansed. A long fiberoptic tube is inserted through your rectum and into your colon. The fiberoptic scope (endoscope) is a long bundle of enclosed and very flexible fibers. These fibers transmit light to the area examined and send images from that area to your caregiver. Discomfort is usually minimal. You may be given a drug to help you sleep (sedative) during or prior to the procedure. This exam helps to detect lumps (tumors), polyps, inflammation, and areas of bleeding. Your caregiver may also take a small piece of tissue (biopsy) that will be examined under a microscope. BEFORE THE PROCEDURE  A clear liquid diet may be required for 2 days before the exam.   Liquid injections (enemas) or laxatives may be required.   A large amount of electrolyte solution may be given to you to drink over a short period of time. This solution is used to clean out your colon.   You should be present as directed prior to your procedure or as directed by your caregiver.   Check in at the admissions desk to fill out necessary forms if not preregistered. There will be consent forms to sign prior to the procedure. If accompanied by friends or family, there is a waiting area for them while you are having your procedure.  LET YOUR CAREGIVER KNOW ABOUT:  Allergies to food or medicine.  Medicines taken, including vitamins, herbs, eyedrops, over-the-counter medicines, and creams.   Use of steroids (by mouth or creams).   Previous problems with anesthetics or numbing medicines.   History of bleeding problems or blood clots.  Previous surgery.   Other health problems, including diabetes and kidney problems.   Possibility of pregnancy, if this applies.   AFTER THE PROCEDURE  If you received a sedative  and/or pain medicine, you will need to arrange for someone to drive you home.   Occasionally, there is a little blood passed with the first bowel movement. DO NOT be concerned.  HOME CARE INSTRUCTIONS  It is not unusual to pass moderate amounts of gas and experience mild abdominal cramping following the procedure. This is due to air being used to inflate your colon during the exam. Walking or a warm pack on your belly (abdomen) may help.   You may resume all normal meals and activities after sedatives and medicines have worn off.   Only take over-the-counter or prescription medicines for pain, discomfort, or fever as directed by your caregiver. DO NOT use aspirin or blood thinners if a biopsy was taken. Consult your caregiver for medicine usage if biopsies were taken.  FINDING OUT THE RESULTS OF YOUR TEST Not all test results are available during your visit. If your test results are not back during the visit, make an appointment with your caregiver to find out the results. Do not assume everything is normal if you have not heard from your caregiver or the medical facility. It is important for you to follow up on all of your test results. SEEK IMMEDIATE MEDICAL CARE IF:  You have an oral temperature above 100.4, not controlled by medicine.   You pass large blood clots or fill a toilet with blood following the procedure. This may also occur 10 to 14 days following the procedure. This is more  likely if a biopsy was taken.   You develop abdominal pain that keeps getting worse and cannot be relieved with medicine.  Document Released: 12/06/2000 Document Re-Released: 03/05/2010 [facility] Patient Information 2011 [place], [place].

## 2011-08-06 NOTE — Progress Notes (Signed)
  Subjective:     Alice Stewart is a 55 y.o. female here for a routine exam.  Current complaints: pain in legs and body.  Pt being worked up for fibromyalgia.  Personal health questionnaire reviewed: yes.   Gynecologic History Patient's last menstrual period was 05/31/2011. Contraception: tubal ligation Last Pap: 9/11. Results were: normal Last mammogram: August 2012. Results were: Needs further imaging.  Pt aware.  Obstetric History OB History    Grav Para Term Preterm Abortions TAB SAB Ect Mult Living   4 3 3  1 1          # Outc Date GA Lbr Len/2nd Wgt Sex Del Anes PTL Lv   1 TRM            2 TRM            3 TRM            4 TAB                The following portions of the patient's history were reviewed and updated as appropriate: allergies, current medications, past family history, past medical history, past social history, past surgical history and problem list.  Review of Systems A comprehensive review of systems was negative except for: Constitutional: positive for fatigue Gastrointestinal: positive for constipation Musculoskeletal: positive for back pain, myalgias and stiff joints    Objective:    BP 148/87  Pulse 87  Temp 98.3 F (36.8 C)  Resp 16  Wt 238 lb (107.956 kg)  LMP 05/31/2011  General Appearance:    Alert, cooperative, no distress, appears stated age  Head:    Normocephalic, without obvious abnormality, atraumatic  Eyes:  Nml conjuctiva        Throat:   Lips, mucosa, and tongue normal; teeth and gums normal  Neck:   Supple, symmetrical, trachea midline, no adenopathy  Back:     Symmetric, no curvature, ROM normal, no CVA tenderness  Lungs:     Clear to auscultation bilaterally, respirations unlabored  Chest Wall:    No tenderness or deformity   Heart:    Regular rate and rhythm  Breast Exam:    No tenderness, masses, or nipple abnormality  Abdomen:     Soft, non-tender, bowel sounds active all four quadrants,    no masses, no organomegaly    Genitalia:    Yeast on vulva.  Nml vagina.  Uterus, NT, mobile; Adnexa, no masses, rectovag--Hemorroids external  Rectal:    Hemorrhoids  Extremities:   Extremities normal, atraumatic, no cyanosis or edema     Skin:   Skin color, texture, turgor normal, no rashes or lesions  Lymph nodes:    Axillary nodes normal         Assessment:    Healthy female exam.    Plan:    Pap setn with HPV.  Pap q 3 yrs if negative. Needs to followup with mammogram--need additional views. Follow up wth primary care provider for myalgias.

## 2011-08-09 ENCOUNTER — Other Ambulatory Visit: Payer: Self-pay | Admitting: Obstetrics & Gynecology

## 2011-08-09 DIAGNOSIS — R928 Other abnormal and inconclusive findings on diagnostic imaging of breast: Secondary | ICD-10-CM

## 2011-08-14 ENCOUNTER — Encounter: Payer: Self-pay | Admitting: Family Medicine

## 2011-08-22 ENCOUNTER — Encounter: Payer: Self-pay | Admitting: Family Medicine

## 2011-08-22 ENCOUNTER — Ambulatory Visit
Admission: RE | Admit: 2011-08-22 | Discharge: 2011-08-22 | Disposition: A | Discharge status: Home / Self Care | Payer: Self-pay | Source: Ambulatory Visit | Attending: Obstetrics & Gynecology | Admitting: Obstetrics & Gynecology

## 2011-08-22 DIAGNOSIS — R928 Other abnormal and inconclusive findings on diagnostic imaging of breast: Secondary | ICD-10-CM

## 2011-08-23 ENCOUNTER — Other Ambulatory Visit: Payer: Self-pay | Admitting: Obstetrics & Gynecology

## 2011-08-27 ENCOUNTER — Ambulatory Visit (INDEPENDENT_AMBULATORY_CARE_PROVIDER_SITE_OTHER): Payer: Self-pay | Admitting: Family Medicine

## 2011-08-27 ENCOUNTER — Encounter: Payer: Self-pay | Admitting: Family Medicine

## 2011-08-27 DIAGNOSIS — E669 Obesity, unspecified: Secondary | ICD-10-CM

## 2011-08-27 DIAGNOSIS — I10 Essential (primary) hypertension: Secondary | ICD-10-CM

## 2011-08-27 DIAGNOSIS — R928 Other abnormal and inconclusive findings on diagnostic imaging of breast: Secondary | ICD-10-CM

## 2011-08-27 NOTE — Progress Notes (Signed)
  Subjective:    Patient ID: Alice Stewart, female    DOB: 1956/11/22, 55 y.o.   MRN: [id number]  HPI When takes her BP med she feels a little lightheaded. Will take diovan in the AM and will take the valturna at night. Says the valturna will make her sleepy as well., Which is why she takes at night. She describes a dizziness sensation is him as a feeling of being "high". She says she is still able to drive and function. She doesn't feel impaired her. She still occasionally gets a throbbing sensation in her neck and chest. Sometimes it can actually be throughout her body. She wonders if this could be from her thyroid. Note she does have her followup with her endocrinologist this fall.  Had a diagnostic mammogram on the 08/22/11. They noticed a supicious lesion about the size of a pea that they recommended bx.  She wanted to know if her BP pills could cause this. She will hopefully get this scheduled soon but she doesn't have insurance.  Still having some occassaional spotting but not more heavy periods in July.  Says has felt weak and tired.   Obesity-she never heard back about a nutrition referral but is still interested in going. She was to make sure that it's a good cone systems and she does have some assistance financially through Mrs. Cohen.   Review of Systems     Objective:   Physical Exam  Constitutional: She is oriented to person, place, and time. She appears well-developed and well-nourished.  HENT:  Head: Normocephalic and atraumatic.  Neck: Neck supple. No thyromegaly present.  Cardiovascular: Normal rate and regular rhythm.        2/6 SEM  Pulmonary/Chest: Effort normal and breath sounds normal.  Lymphadenopathy:    She has no cervical adenopathy.  Neurological: She is alert and oriented to person, place, and time.  Skin: Skin is warm and dry.  Psychiatric: She has a normal mood and affect. Her behavior is normal.          Assessment & Plan:  Hypertension-unfortunately  I think we are stuck with our current blood pressure medications. She has tried so many different medications in the past have not worked well for her that I explained that she might have to deal with some lightheadedness. Certainly these medications are working very well for her. She is the best blood pressure today that she's had a very long time.  Abnormal mammogram-currently they are working to get her scholarship so that she can have the biopsy performed on her left breast. I asked her to let me know she does hear back from them sometimes soon.  Obesity-will check on her referral to nutrition. I did congratulate her that she has lost another pound since her last visit. I encouraged her to keep up working on her diet and trying to get some regular exercise.

## 2011-08-29 ENCOUNTER — Other Ambulatory Visit: Payer: Self-pay | Admitting: Obstetrics and Gynecology

## 2011-08-29 DIAGNOSIS — N63 Unspecified lump in unspecified breast: Secondary | ICD-10-CM

## 2011-09-03 ENCOUNTER — Ambulatory Visit (INDEPENDENT_AMBULATORY_CARE_PROVIDER_SITE_OTHER): Payer: Self-pay | Admitting: *Deleted

## 2011-09-03 ENCOUNTER — Encounter (HOSPITAL_COMMUNITY): Payer: Self-pay

## 2011-09-03 ENCOUNTER — Ambulatory Visit
Admission: RE | Admit: 2011-09-03 | Discharge: 2011-09-03 | Disposition: A | Discharge status: Home / Self Care | Payer: No Typology Code available for payment source | Source: Ambulatory Visit | Attending: Obstetrics and Gynecology | Admitting: Obstetrics and Gynecology

## 2011-09-03 VITALS — BP 141/75 | HR 78 | Temp 98.0°F | Resp 20 | Ht 64.0 in | Wt 237.0 lb

## 2011-09-03 DIAGNOSIS — N63 Unspecified lump in unspecified breast: Secondary | ICD-10-CM

## 2011-09-03 DIAGNOSIS — N6320 Unspecified lump in the left breast, unspecified quadrant: Secondary | ICD-10-CM

## 2011-09-03 DIAGNOSIS — Z1239 Encounter for other screening for malignant neoplasm of breast: Secondary | ICD-10-CM

## 2011-09-03 NOTE — Patient Instructions (Addendum)
Patient taught how to do BSE and gave her a shower card showing to remind her to do her BSE. Patient given follow up appointment at the [facility] for 2pm today for a breast biopsy. Patient verbalized understanding.  Patient's blood pressure today was 141/75. Patient takes medications for her history of high BP. Encouraged patient to have blood pressure checked weekly and gave her a sheet to document results to share with her physician. Told patient if BP continues to be elevated that she needs to talk with her primary care physician. Patient verbalized understanding.

## 2011-09-03 NOTE — Progress Notes (Signed)
Patient presented with complaints of a mass in her left breast seen on mammogram on August 22, 2011. Patient complained of some pain in that area since mammogram.  Pap Smear:    Pap Smear performed on August 06, 2011 at [facility] by Dr. Penne Lash. Pap result was normal. Patient did not need Pap Smear today due to having last pap smear less than a month ago.  Physical exam: Breasts      Breasts symmetrical. No skin abnormalities, no nipple retraction and no nipple discharge. Small pea sized lump palpated around 3 o'clock in left breast. Patient complained of tenderness in that area on palpation. Lump was shown on diagnostic mammogram completed on August 22, 2011. Patient is scheduled for a follow up biopsy at 2pm today.      Pelvic/Bimanual No pelvic/bimanual exam completed today since had last exam less than one month ago. Exam not needed today.

## 2011-09-04 ENCOUNTER — Telehealth: Payer: Self-pay | Admitting: *Deleted

## 2011-09-04 NOTE — Telephone Encounter (Signed)
Pt notified of the results of her breast biopsy as being benign.  Dr.Leggett notified via phone.

## 2011-09-13 ENCOUNTER — Telehealth: Payer: Self-pay | Admitting: Family Medicine

## 2011-09-13 NOTE — Telephone Encounter (Signed)
OK to take a 4th tab if needed.

## 2011-09-13 NOTE — Telephone Encounter (Signed)
Pt informed to take tramadol 50 mg every 6 hours instead of every 8 hours to see if that would help with the fibromyalgia pain she is experiencing.  Had to [facility] for the pt. Jarvis Newcomer, LPN [facility] Dimes

## 2011-09-13 NOTE — Telephone Encounter (Signed)
Pt called and is complaining that her fibromyalgia pain is worsening.  #8/10.  Pt is using the tramadol 50 mg every 8 hours as instructed. Plan:  When looking at the med list pt was instructed to take the tramadol every 6 hours.  Could actually get another dose within the 24 hours.  However, will route this encounter to Dr. Linford Arnold to offer recommendations since pt is complaining of pain all over. Jarvis Newcomer, LPN [facility] Dimes

## 2011-09-16 ENCOUNTER — Telehealth: Payer: Self-pay | Admitting: *Deleted

## 2011-09-16 LAB — URINALYSIS, ROUTINE W REFLEX MICROSCOPIC
Glucose, UA: NEGATIVE
Protein, ur: NEGATIVE

## 2011-09-16 LAB — URINE MICROSCOPIC-ADD ON

## 2011-09-16 NOTE — Telephone Encounter (Deleted)
Called patient on

## 2011-09-17 ENCOUNTER — Telehealth: Payer: Self-pay | Admitting: *Deleted

## 2011-09-17 LAB — CBC
HCT: 33 — ABNORMAL LOW
MCV: 90.4
Platelets: 542 — ABNORMAL HIGH
RBC: 3.66 — ABNORMAL LOW
WBC: 7.5

## 2011-09-17 LAB — BASIC METABOLIC PANEL
BUN: 9
CO2: 31
Chloride: 105
Creatinine, Ser: 0.72
GFR calc Af Amer: >60
Potassium: 3.1 — ABNORMAL LOW

## 2011-09-17 LAB — PREGNANCY, URINE: Preg Test, Ur: NEGATIVE

## 2011-09-17 MED ORDER — VALSARTAN-HYDROCHLOROTHIAZIDE 320-25 MG PO TABS: 1.0000 | ORAL_TABLET | Freq: Every day | ORAL | Status: DC

## 2011-09-17 NOTE — Telephone Encounter (Signed)
Pt calls and request samples of Diovan 320/25mg  or 160/25mg . I checked and we do not have these meds at all. Pt states can not afford them because she has to pay cash out of pocket for meds right now because husband is out of work. Do you have any suggestions for her

## 2011-09-17 NOTE — Telephone Encounter (Signed)
Called [employer] and they no longer have drug reps that come out. Did go online and print out patient assisstance forms and pt savings card app and notiifed pt to pick up and fill out to mail to company to see if qualifies for assistance.

## 2011-09-17 NOTE — Telephone Encounter (Signed)
Called patient on 09/12/11 at 9:08pm to discuss results from [facility] clinic on 09/03/11. [facility] stated [facility] already called with results and told her to come back in 6 months for follow up. Followed up on [facility]'s recommendation and told patient I would call back to confirm what needed to be done at 6 months. Called patient back at 12:57pm and no one answered phone. Left message for patient to call me back.  Patient called me back at 2:16pm on 09/16/11. Told patient what follow up was needed at 6 months. Told her she needs to get a repeat diagnostic mammogram per the physician who [facility] the [facility]. Told patient to call me early March to get her appointment set up to be covered through [employer] [facility] [facility] [employer] or [employer]. Patient verbalized understanding.

## 2011-09-17 NOTE — Telephone Encounter (Signed)
Can we cll the rep and see if they can bring some?

## 2011-09-23 ENCOUNTER — Telehealth: Payer: Self-pay | Admitting: *Deleted

## 2011-09-23 NOTE — Telephone Encounter (Signed)
Pt moved up appointment to 09/26/2011 at 3:30pm.

## 2011-09-23 NOTE — Telephone Encounter (Signed)
Pt called regarding thyroid sxs. She states that she feels like she cannot swallow at times and that she also is having throbbing all over and fatigue. She is wondering if she needs surgery for her thyroid or what other options MD recommends.

## 2011-09-23 NOTE — Telephone Encounter (Signed)
Please advise ov 

## 2011-09-26 ENCOUNTER — Ambulatory Visit: Payer: Self-pay | Admitting: Endocrinology

## 2011-09-26 DIAGNOSIS — Z0289 Encounter for other administrative examinations: Secondary | ICD-10-CM

## 2011-10-01 ENCOUNTER — Ambulatory Visit (INDEPENDENT_AMBULATORY_CARE_PROVIDER_SITE_OTHER): Payer: No Typology Code available for payment source | Admitting: Family Medicine

## 2011-10-01 ENCOUNTER — Encounter: Payer: Self-pay | Admitting: Family Medicine

## 2011-10-01 DIAGNOSIS — I10 Essential (primary) hypertension: Secondary | ICD-10-CM

## 2011-10-01 DIAGNOSIS — R079 Chest pain, unspecified: Secondary | ICD-10-CM

## 2011-10-01 DIAGNOSIS — K449 Diaphragmatic hernia without obstruction or gangrene: Secondary | ICD-10-CM

## 2011-10-01 DIAGNOSIS — M940 Chondrocostal junction syndrome [Tietze]: Secondary | ICD-10-CM

## 2011-10-01 MED ORDER — OMEPRAZOLE-SODIUM BICARBONATE 40-1100 MG PO CAPS: 1.0000 | ORAL_CAPSULE | Freq: Every day | ORAL | Status: AC

## 2011-10-01 MED ORDER — MELOXICAM 7.5 MG PO TABS: 7.5000 mg | ORAL_TABLET | Freq: Every day | ORAL | Status: DC

## 2011-10-01 NOTE — Progress Notes (Signed)
Subjective:    Patient ID: Alice Stewart, female    DOB: 10-25-1956, 55 y.o.   MRN: [id number]  Hypertension This is a new problem. The current episode started yesterday. The problem has been resolved since onset. Associated symptoms include anxiety, chest pain, malaise/fatigue and shortness of breath. Risk factors for coronary artery disease include family history, obesity, sedentary lifestyle and stress. Past treatments include nothing. Compliance problems include medication side effects.   Chest Pain  Associated symptoms include malaise/fatigue, shortness of breath and weakness. Pertinent negatives include no cough.  Her past medical history is significant for hypertension.  She reports difficulty sleping lastr night but things are much better now. It should be noted that the pain was on the R side. It did not move. She thought she had an EKG here but none was found. She states in 2005 she had a neg stress and cath that was normal.    Review of Systems  Constitutional: Positive for malaise/fatigue.  Respiratory: Positive for chest tightness and shortness of breath. Negative for cough.   Cardiovascular: Positive for chest pain.       No more pain for at least 6 hours.  Neurological: Positive for weakness and light-headedness.  Psychiatric/Behavioral: The patient is nervous/anxious.        Objective:   Physical Exam  Constitutional: She is oriented to person, place, and time. Vital signs are normal. She appears well-developed and well-nourished.       Obese WF  HENT:  Head: Normocephalic.  Neck: Normal range of motion.  Cardiovascular: Normal rate, regular rhythm and normal heart sounds.        Marked tenderness over the sternum w/R>L. Also bowels sounds heard in chest as well  Pulmonary/Chest: Effort normal and breath sounds normal.  Neurological: She is alert and oriented to person, place, and time.  Skin: Skin is warm and dry.  Psychiatric: She has a normal mood and affect.    EKG today LBBB  BP 148/80  Pulse 75  Temp(Src) 98.8 F (37.1 C) (Oral)  Ht 5\' 4"  (1.626 m)  Wt 238 lb (107.956 kg)  BMI 40.85 kg/m2  SpO2 97%  LMP 08/31/2011 Allergies  Allergen Reactions  . Bystolic (Nebivolol Hcl) Other (See Comments)    Nausea.   . Caduet     REACTION: salty taste in mouth, stomach burning, H/A  . Chlorthalidone Nausea Only    Headache  . Cymbalta (Duloxetine Hcl) Other (See Comments)    She didn't like how it made her feel  . Diltiazem Hcl     REACTION: Dizzy  . Hydrochlorothiazide     REACTION: increase urination  . Lisinopril     REACTION: backache  . Metoprolol Tartrate     REACTION: nausea,HA, dizziness  . Simvastatin     REACTION: muscle aches   History   Social History  . Marital Status: Married    Spouse Name: N/A    Number of Children: 3  . Years of Education: 14   Occupational History  . unemployed Statistician   Social History Main Topics  . Smoking status: Former Smoker    Quit date: 09/22/2004  . Smokeless tobacco: Never Used  . Alcohol Use: 0.5 oz/week    1 drink(s) per week     1 weekly  . Drug Use: No  . Sexually Active: Not Currently   Other Topics Concern  . Not on file   Social History Narrative   3-4 caffeinated drinks per dayNo regular exercise  Assessment & Plan:  Chest pain Your chest pain is thought to be from Costochondritis and esophogeal reflux. But even though there is no more pain and it was on the R side if symptoms resume please go to the closest ED of your choice for further evaluation.  Please return in 1 month for reevaluation and get CXR and stress test  W/cardiologist. Mobic 1 tablet a day for chest wall pain and zegrid for reflux. Hypertension  Will need to consider othe r medications  For hypertension but patient does not tolerate a host of antihypertensive medications. Genella Rife will place on zegred. Outpatient Encounter Prescriptions as of 10/01/2011  Medication Sig Dispense Refill  .  Ascorbic Acid (VITAMIN C) 1000 MG tablet Take 1,000 mg by mouth daily.        . Black Cohosh 450 MG CAPS Take 1 capsule by mouth daily.       . Calcium 1500 MG tablet Take 1,500 mg by mouth.        . Evening Primrose Oil 1000 MG CAPS Take 1 capsule by mouth daily.        . Ginkgo Biloba 40 MG TABS Take 1 tablet by mouth daily.        . methimazole (TAPAZOLE) 5 MG tablet Take one tablet by mouth 3 times weekly. For your thyroid.       . Multiple Vitamin (MULTIVITAMIN) tablet Take 1 tablet by mouth daily.        . Red Clover Leaf Extract 500 MG TABS Take 1 tablet by mouth daily.        Gerarda Fraction Root 450 MG CAPS Take 1 capsule by mouth at bedtime.        . valsartan-hydrochlorothiazide (DIOVAN-HCT) 320-25 MG per tablet Take 1 tablet by mouth daily.  90 tablet  3  . VITAMIN D, ERGOCALCIFEROL, PO Take 1,000 mg by mouth daily.       . vitamin E (VITAMIN E) 400 UNIT capsule Take 400 Units by mouth daily.        . Aliskiren-Valsartan (VALTURNA) 150-160 MG TABS Take one pill daily.  28 tablet  0  . meloxicam (MOBIC) 7.5 MG tablet Take 1 tablet (7.5 mg total) by mouth daily. Do not take w/motrin alleve or excedrin  30 tablet  2  . omeprazole-sodium bicarbonate (ZEGERID) 40-1100 MG per capsule Take 1 capsule by mouth daily before breakfast.  30 capsule  3

## 2011-10-01 NOTE — Patient Instructions (Signed)
Your chest pain is thought to be from Costochondritis and esophogeal reflux. But even though there is no more pain and it was on the R side if symptoms resume please go to the closest ED of your choice for further evaluation.  Please return in 1 month for reevaluation and get CXR and stress test  W/cardiologist. Mobic 1 tablet a day for chest wall pain and zegrid for reflux.

## 2011-10-02 ENCOUNTER — Telehealth: Payer: Self-pay | Admitting: *Deleted

## 2011-10-02 NOTE — Telephone Encounter (Signed)
LMOM informing Pt of the above 

## 2011-10-02 NOTE — Telephone Encounter (Signed)
Instead of Mobic I recommend over-the-counter Aleve. She can start to try the store brand. One twice a day is the maximum dose. As it is a grade she could try over-the-counter Prevacid once a day. I don't really have any other suggestions for a cheaper drug on the Diovan. She is trisomy multiple medications in the past this is why the fever has actually worked well for her without major side effects. I can't remember if she is trying to fill out paperwork for patient assistance but she may qualify so it's probably worth filling them out. She can certainly fill out applications for each drug that she takes.

## 2011-10-02 NOTE — Telephone Encounter (Signed)
Mobic, Zegerid and Diovan are all too expensive for Pt. She needs suggestions on something cheaper. Please advise.

## 2011-10-03 ENCOUNTER — Ambulatory Visit
Admission: RE | Admit: 2011-10-03 | Discharge: 2011-10-03 | Disposition: A | Discharge status: Home / Self Care | Payer: No Typology Code available for payment source | Source: Ambulatory Visit | Attending: Family Medicine | Admitting: Family Medicine

## 2011-10-03 DIAGNOSIS — M940 Chondrocostal junction syndrome [Tietze]: Secondary | ICD-10-CM

## 2011-10-07 ENCOUNTER — Encounter: Payer: Self-pay | Admitting: Family Medicine

## 2011-10-08 ENCOUNTER — Other Ambulatory Visit (INDEPENDENT_AMBULATORY_CARE_PROVIDER_SITE_OTHER): Payer: Self-pay

## 2011-10-08 ENCOUNTER — Ambulatory Visit: Payer: Self-pay | Admitting: Endocrinology

## 2011-10-08 ENCOUNTER — Ambulatory Visit (INDEPENDENT_AMBULATORY_CARE_PROVIDER_SITE_OTHER): Payer: Self-pay | Admitting: Endocrinology

## 2011-10-08 ENCOUNTER — Encounter: Payer: Self-pay | Admitting: Endocrinology

## 2011-10-08 VITALS — BP 128/78 | HR 78 | Temp 98.7°F | Ht 64.0 in | Wt 236.5 lb

## 2011-10-08 DIAGNOSIS — E0590 Thyrotoxicosis, unspecified without thyrotoxic crisis or storm: Secondary | ICD-10-CM

## 2011-10-08 LAB — TSH: TSH: 1.77 u[IU]/mL (ref 0.35–5.50)

## 2011-10-08 NOTE — Patient Instructions (Addendum)
blood tests are being ordered for you today.  please call [phone] to hear your test results.  You will be prompted to enter the 9-digit "MRN" number that appears at the top left of this page, followed by #.  Then you will hear the message. Please make a follow-up appointment in 3 months. The effect of the methimazole will take time to leave your body.

## 2011-10-08 NOTE — Progress Notes (Signed)
Subjective:    Patient ID: Alice Stewart, female    DOB: 1956-03-09, 55 y.o.   MRN: [id number]  HPI Pt is here to f/u hyperthyroidism. She chose tapazole rx.  She got a print-out from the pharmacy, saying it causes dizziness, drowsiness, headache, myalgias, nausea, numbness, menorrhagia, weakness, abd pain, and edema.  She has all of these sxs, so she stopped it 4 days ago.  Aside from some dizziness, all sxs have resolved off the tapazole.   Past Medical History  Diagnosis Date  . Thyroid disorder   . Hyperthyroidism   . Benign positional vertigo   . Anemia   . Menorrhagia   . Neck pain, acute   . Menopausal disorder   . Back pain   . Obesity   . Carpal tunnel syndrome   . Muscle spasm     trapezius  . Migraine   . Insulin resistance syndrome   . Goiter     nontoxid mutinodular  . Seborrheic dermatitis   . Hypertension     Past Surgical History  Procedure Date  . Heel spur surgery 11/87    Lt foot  . Tubal ligation 11/90  . Great toe arthrodesis, metatarsalphalangeal joint 6/07    Rt foot  . Dilation and curettage of uterus 04/09  . Endometrial ablation 04/09    History   Social History  . Marital Status: Married    Spouse Name: N/A    Number of Children: 3  . Years of Education: 14   Occupational History  . unemployed Statistician   Social History Main Topics  . Smoking status: Former Smoker    Quit date: 09/22/2004  . Smokeless tobacco: Never Used  . Alcohol Use: 0.5 oz/week    1 drink(s) per week     1 weekly  . Drug Use: No  . Sexually Active: Not Currently   Other Topics Concern  . Not on file   Social History Narrative   3-4 caffeinated drinks per dayNo regular exercise    Current Outpatient Prescriptions on File Prior to Visit  Medication Sig Dispense Refill  . Ascorbic Acid (VITAMIN C) 1000 MG tablet Take 1,000 mg by mouth daily.        . Black Cohosh 450 MG CAPS Take 1 capsule by mouth daily.       . Calcium 1500 MG tablet Take 1,500 mg by  mouth.        . Evening Primrose Oil 1000 MG CAPS Take 1 capsule by mouth daily.        . Ginkgo Biloba 40 MG TABS Take 1 tablet by mouth daily.        . Multiple Vitamin (MULTIVITAMIN) tablet Take 1 tablet by mouth daily.        . Red Clover Leaf Extract 500 MG TABS Take 1 tablet by mouth daily.        Gerarda Fraction Root 450 MG CAPS Take 1 capsule by mouth at bedtime.        . valsartan-hydrochlorothiazide (DIOVAN-HCT) 320-25 MG per tablet Take 1 tablet by mouth daily.  90 tablet  3  . VITAMIN D, ERGOCALCIFEROL, PO Take 1,000 mg by mouth daily.       . vitamin E (VITAMIN E) 400 UNIT capsule Take 400 Units by mouth daily.        . meloxicam (MOBIC) 7.5 MG tablet Take 1 tablet (7.5 mg total) by mouth daily. Do not take w/motrin alleve or excedrin  30 tablet  2  . omeprazole-sodium bicarbonate (ZEGERID) 40-1100 MG per capsule Take 1 capsule by mouth daily before breakfast.  30 capsule  3    Allergies  Allergen Reactions  . Bystolic (Nebivolol Hcl) Other (See Comments)    Nausea.   . Caduet     REACTION: salty taste in mouth, stomach burning, H/A  . Chlorthalidone Nausea Only    Headache  . Cymbalta (Duloxetine Hcl) Other (See Comments)    She didn't like how it made her feel  . Diltiazem Hcl     REACTION: Dizzy  . Hydrochlorothiazide     REACTION: increase urination  . Lisinopril     REACTION: backache  . Metoprolol Tartrate     REACTION: nausea,HA, dizziness  . Simvastatin     REACTION: muscle aches    Family History  Problem Relation Age of Onset  . Other Mother     MS  . Hypertension Father   . Hypothyroidism Sister   . Hypertension Sister   . Diabetes Other    BP 128/78  Pulse 78  Temp(Src) 98.7 F (37.1 C) (Oral)  Ht 5\' 4"  (1.626 m)  Wt 236 lb 8 oz (107.276 kg)  BMI 40.60 kg/m2  SpO2 97%  LMP 09/27/2011  Review of Systems Denies fever.      Objective:   Physical Exam VITAL SIGNS:  See vs page GENERAL: no distress NECK: There is no palpable thyroid  enlargement.  No thyroid nodule is palpable.  No palpable lymphadenopathy at the anterior neck.   Lab Results  Component Value Date   TSH 1.77 10/08/2011      Assessment & Plan:  Hyperthyroidism, therapy is limited by perceived drug intolerance.  This tsh does not yet reflect the discontinuation of the tapazole.

## 2011-10-10 ENCOUNTER — Encounter: Payer: Self-pay | Admitting: *Deleted

## 2011-10-10 ENCOUNTER — Encounter: Payer: Self-pay | Attending: Family Medicine | Admitting: *Deleted

## 2011-10-10 VITALS — Ht 64.0 in | Wt 236.4 lb

## 2011-10-10 DIAGNOSIS — E669 Obesity, unspecified: Secondary | ICD-10-CM | POA: Insufficient documentation

## 2011-10-10 DIAGNOSIS — Z713 Dietary counseling and surveillance: Secondary | ICD-10-CM | POA: Insufficient documentation

## 2011-10-10 NOTE — Progress Notes (Addendum)
Medical Nutrition Therapy:  Appt start time: 1500 end time:  1600.  Assessment:  Primary concerns today: Obesity.  Patient here for help with diet and weight loss.  Reports increased financial and marital stress, as well as a 38 lb weight gain since Jan '12. Reports a goal weight of 130 lbs, though just wants to "feel good". Recall shows excessive CHO at meals, though overall choices fair. States a love for chocolate, but that she hasn't had any recently.  Recent rxn to methimazole and resulting sx of nausea, fatigue, and edema have kept pt from exercising. Pt reports a little pain at this time d/t possible early stages of fibromyalgia, and some residual bloating and fatigue from meds.   MEDICATIONS: See Medication List; reviewed with patient.   DIETARY INTAKE:  Usual eating pattern includes 3 meals and 2 snacks per day.  24-hr recall:  B ( AM): Honey Nut Cheerios w/ whole milk, key lime pie yogurt (6oz), coffee w/ cream   Snk ( AM): none  L ( PM): Hot dog, FF, diet Dr. Reino Kent (24 oz) Snk ( PM): Few cookies or frozen fruit (1 cup berries) D ( PM): Beef roast w/ gravy, broccoli, sweet potato, biscuit (homemade) w/ preserves; orange crush (reg) 12 oz Snk ( PM): pumpkin spice cake  Usual physical activity: None - used to walk "some" with husband  Estimated energy needs: 1200-1300 calories 145-155 g carbohydrates 80-90 g protein 35-40 g fat  Progress Towards Goal(s):  NEW.   Nutritional Diagnosis:  [place]-3.3 Morbid obesity related to excessive CHO intake as evidenced by 24-hour dietary recall and a BMI of 40.6 kg/m^2.    Intervention/Goals:  Follow 3 phase plan (see yellow card).  Eat 3 meals/day;  Continue to avoid meal skipping.   Limit carbohydrate intake to 45 grams/snack/meal and 15 grams/snack.   Add lean protein-rich foods with all meals and snacks.   Choose more whole grains, lean protein, low-fat dairy, and fruits/non-starchy vegetables.   Aim for >30 min of physical  activity 3 days/week.  Limit sugar-sweetened beverages and concentrated sweets.  Handouts given during visit include:  45g CHO meals; CHO/Pro snack list  Fiber Foods list  [facility] Heart Healthy Eating booklet  Monitoring/Evaluation:  Dietary intake, exercise, and body weight in 2 week(s).  Samples Given:  1. CIB regular: 3;  Exp: 03/01/12 2. CIB regular: 9;  Exp: 08/05/12 3. Fiber One bars: 30;  Exp: 03/09/12 4. Fiber One cereal: 20;  Exp: 12/18/11

## 2011-10-10 NOTE — Patient Instructions (Signed)
Goals:  Follow 3 phase plan (see yellow card).  Eat 3 meals/day;  Continue to avoid meal skipping.   Limit carbohydrate intake to 45 grams/snack/meal and 15 grams/snack.   Add lean protein-rich foods with all meals and snacks.   Choose more whole grains, lean protein, low-fat dairy, and fruits/non-starchy vegetables.   Aim for >30 min of physical activity 3 days/week.  Limit sugar-sweetened beverages and concentrated sweets.

## 2011-10-11 ENCOUNTER — Encounter: Payer: No Typology Code available for payment source | Admitting: Physician Assistant

## 2011-10-14 ENCOUNTER — Telehealth: Payer: Self-pay | Admitting: Family Medicine

## 2011-10-14 NOTE — Telephone Encounter (Signed)
I honestly don't know. Please ask Venise, as she used to work in that department. She may know. We can also call her should her directly and see if they are aware.

## 2011-10-14 NOTE — Telephone Encounter (Signed)
Pt called and said she is having pain with burning and stinging in RT ear, and some in the LT ear since last Thursday.  Pt is covered 100% by [facility] when she is seen. Plan:  Dr Linford Arnold can I send pt to the UC and the [place] coverage apply for the pt.  We have no appts and pt does not want to see Dr. Thurmond Butts.  Pt does not have money to cover meds either.  Please advise. Routed to Dr. Marlyne Beards, LPN [facility]

## 2011-10-14 NOTE — Telephone Encounter (Signed)
Pt notified that any [facility] facility should be able to see her under the financial assistance program, but to make sure she informs them up front.  As far as medications go, pt informed UC to my knowledge does not have sample medications, but they may be able to help send her to crisis control center that can help with her medications, but not absolutely sure.  Pt voiced understanding and will go to UC. Jarvis Newcomer, LPN [facility]

## 2011-10-17 ENCOUNTER — Encounter: Payer: Self-pay | Admitting: Physician Assistant

## 2011-10-22 ENCOUNTER — Ambulatory Visit (INDEPENDENT_AMBULATORY_CARE_PROVIDER_SITE_OTHER): Payer: Self-pay | Admitting: Physician Assistant

## 2011-10-22 DIAGNOSIS — R079 Chest pain, unspecified: Secondary | ICD-10-CM

## 2011-10-22 NOTE — Progress Notes (Signed)
Alice Stewart is a 55 y.o. female referred for a treadmill test for evaluation of chest pain. She states she has a h/o LBBB.  She has had a heart cath per her report 7 years ago that was normal. ECG today demonstrates NSR, LBBB.  I explained to her today that we cannot perform an exercise test on her due to her LBBB.  She will be rescheduled for a Pharmacologic Nuclear Stress Test. Tereso Newcomer, PA-C

## 2011-10-24 ENCOUNTER — Encounter: Payer: Self-pay | Admitting: *Deleted

## 2011-10-24 ENCOUNTER — Encounter: Payer: Self-pay | Attending: Family Medicine | Admitting: *Deleted

## 2011-10-24 DIAGNOSIS — E669 Obesity, unspecified: Secondary | ICD-10-CM | POA: Insufficient documentation

## 2011-10-24 DIAGNOSIS — Z713 Dietary counseling and surveillance: Secondary | ICD-10-CM | POA: Insufficient documentation

## 2011-10-24 NOTE — Progress Notes (Signed)
Medical Nutrition Therapy:  Appt start time: 1500 end time:  1600.  Primary concerns today: Obesity; Follow up.  Patient reports many positive dietary changes since last visit. Eating frozen and canned fruit (no juice) w/ higher fiber, Health Nut bread, etc. Reports eating 3 meals and 1-2 snacks daily. Still having trouble with ice cream (1/2 - 1 cup at a time), but is more aware of portions and measuring many foods.  States she was overwhelmed the first grocery store trip, but much better now.  Asked a few questions regarding bariatric surgery, but is not ready to pursue at this time.   MEDICATIONS: See Medication List; reviewed with patient.   DIETARY INTAKE:  Usual eating pattern includes 3 meals and 1-2 snacks per day.  24-hr recall:  B ( AM): Fiber one cereal w/ milk and pc of fruit or yogurt   Snk ( AM): none  L ( PM): Ham & cheese sandwich, pc of fruit, water or diet drink Snk ( PM):  Fiber One bar D ( PM): Shrimp, frozen stir fry bag, white rice (1/2-3/4c), onion, garlic, curry, ginger Snk ( PM): sometimes ice cream  Usual physical activity:  Walks sometimes at the mall or outside after dinner with husband  Estimated energy needs: 1200-1300 calories 145-155 g carbohydrates 80-90 g protein 35-40 g fat  Progress Towards Goal(s):  Some progress.   Nutritional Diagnosis:  [place]-3.3 Morbid obesity related to excessive CHO intake as evidenced by 24-hour dietary recall and a BMI of 40.6 kg/m^2.    Intervention/Goals:  Continue to follow phase I of plan.  Stay within carbohydrate portions at meals and snacks.  Call me with ANY problems or questions.   Monitoring/Evaluation:  Dietary intake, exercise, and body weight in 2 week(s).  Samples given:   Fiber One bars: 15 ea  Exp: 03/09/12  Fiber One cereal: 10 packets Exp: 12/18/11

## 2011-10-24 NOTE — Patient Instructions (Signed)
Goals:  Continue to follow phase I of plan (see yellow card).  Stay within carbohydrate portions at meals and snacks.  Call me with ANY problems or questions.

## 2011-11-05 ENCOUNTER — Ambulatory Visit (INDEPENDENT_AMBULATORY_CARE_PROVIDER_SITE_OTHER): Payer: Self-pay | Admitting: Family Medicine

## 2011-11-05 ENCOUNTER — Encounter: Payer: Self-pay | Admitting: Family Medicine

## 2011-11-05 DIAGNOSIS — I10 Essential (primary) hypertension: Secondary | ICD-10-CM

## 2011-11-05 DIAGNOSIS — H9209 Otalgia, unspecified ear: Secondary | ICD-10-CM

## 2011-11-05 MED ORDER — LOSARTAN POTASSIUM 100 MG PO TABS: 100.0000 mg | ORAL_TABLET | Freq: Every day | ORAL | Status: DC

## 2011-11-05 NOTE — Progress Notes (Signed)
  Subjective:    Patient ID: Alice Stewart, female    DOB: December 28, 1955, 55 y.o.   MRN: [id number]  HPI  With the diovan she does well. Gets a little sleepy and fatiguedon the med.  She has tried so many in the past that she feels she didn't tolerate well. She has lost about 6 lbs sine last visit. Has been working with a nutritionist and has really inc the fiber in her diet.   bila ear pain radiating into her neck.  Has had runny wax. No fever. + fatiue but chronic.  She would like me to check her ears. No other URI symptoms.  Stopped her thyroid med 10/12 and she has been feeling mjuch better. She has a followup with Alice Stewart in January.  Review of Systems     Objective:   Physical Exam  Constitutional: She is oriented to person, place, and time. She appears well-developed and well-nourished.  HENT:  Head: Normocephalic and atraumatic.  Right Ear: External ear normal.  Left Ear: External ear normal.  Nose: Nose normal.  Mouth/Throat: Oropharynx is clear and moist.       TMs and canals are clear.   Eyes: Conjunctivae and EOM are normal. Pupils are equal, round, and reactive to light.  Neck: Neck supple. No thyromegaly present.  Cardiovascular: Normal rate, regular rhythm and normal heart sounds.   Pulmonary/Chest: Effort normal and breath sounds normal. She has no wheezes.  Musculoskeletal: She exhibits no edema.  Lymphadenopathy:    She has no cervical adenopathy.  Neurological: She is alert and oriented to person, place, and time.  Skin: Skin is warm and dry.  Psychiatric: She has a normal mood and affect. Her behavior is normal.          Assessment & Plan:  Hypertension-her blood pressure is not well-controlled today. Unfortunately would not have any more samples of the Diovan. We will try losartan he can which I believe we have done in the past. It is more affordable and we will retry it without the diuretic. She can call if the medication is still too expensive.  Consider Alice Stewart if doesn't tolerate the losartan. Continue to work on healthy diet and regular exercise. I think fantastic that she has lost 4 pounds. I congratulated her. I would love to see her get under 200 pounds I think this would dramatically help her blood pressure.  The otalgia-no infection on exam. She could have some eustachian tube dysfunction which is not a candidate for decongestants and she has high blood pressure. She already uses a nasal steroid spray sample had given her previously. She can also try Vicks VapoRub at night on her chest and see if this helps open up her sinuses and her ears. She feels it's getting worse with scoliosis.  Goiter-she is being followed by Alice Stewart.

## 2011-11-07 ENCOUNTER — Ambulatory Visit: Payer: Self-pay | Admitting: *Deleted

## 2011-11-26 ENCOUNTER — Ambulatory Visit (INDEPENDENT_AMBULATORY_CARE_PROVIDER_SITE_OTHER): Payer: Self-pay | Admitting: Family Medicine

## 2011-11-26 ENCOUNTER — Encounter: Payer: Self-pay | Admitting: Family Medicine

## 2011-11-26 VITALS — BP 155/79 | HR 81 | Wt 235.0 lb

## 2011-11-26 DIAGNOSIS — Z23 Encounter for immunization: Secondary | ICD-10-CM

## 2011-11-26 DIAGNOSIS — I10 Essential (primary) hypertension: Secondary | ICD-10-CM

## 2011-11-26 DIAGNOSIS — IMO0001 Reserved for inherently not codable concepts without codable children: Secondary | ICD-10-CM

## 2011-11-26 DIAGNOSIS — M791 Myalgia, unspecified site: Secondary | ICD-10-CM

## 2011-11-26 DIAGNOSIS — M2550 Pain in unspecified joint: Secondary | ICD-10-CM

## 2011-11-26 NOTE — Patient Instructions (Signed)
We will call you with your lab results 

## 2011-11-26 NOTE — Progress Notes (Signed)
  Subjective:    Patient ID: Alice Stewart, female    DOB: August 19, 1956, 55 y.o.   MRN: [id number]  HPI Her to discuss her myalgias. She has had muscle aches and pains for years and suspect that she may have fibromyalgia. Most prominently she complains of neck pain. She says at times she gets sharp shooting, searing pains in her neck. She also has some tenderness over the trapezius muscles bilaterally. She also gets pain at the base of her skull. Occasionally she gets numbness and paresthesias in both arms though she feels it is worse on the right. She also complains of tenderness and muscle aches in her upper thighs. She says some days she wakes up she feels like someone has beaten her with a stick even though she hasn't really done much. She also feels she has some arthritis in her low back. She has frequent low back pain that is exacerbated with prolonged sitting and standing. It makes it difficult for her to hold down a job. She did bring in a CD with some old MRIs of her cervical spine. She was told she previously had some disc problems in that area. She also complains of frequent headaches and paresthesias in her arms.  Mother w/ hx of MS.    Review of Systems     Objective:   Physical Exam  Constitutional: She appears well-developed and well-nourished.  HENT:  Head: Normocephalic and atraumatic.  Cardiovascular: Normal rate, regular rhythm and normal heart sounds.   Pulmonary/Chest: Effort normal and breath sounds normal.  Musculoskeletal:       She is tender at the occiput bilaterally. She has normal range of motion of cervical spine but does have some pain with rotation. She's also tender over the both upper trapezius muscles. She's also somewhat tender at the lower cervical spine. Upper extremities with normal range of motion but she is tender over both forearms. She's also tender over bilateral lumbar paraspinous muscles in both SI joints. She has a negative straight leg raise  bilaterally. Upper extremity and lower extremity strength is 5 over 5 bilaterally. She has no pain or tenderness in the thighs today but she is very tender over the calf muscles. Do not appreciate any erythema or swelling of any of her distal joints. She is also mildly tender anterior chest wall with mild palpation. Reflexes in upper and lower extremities are symmetric.  Skin: Skin is warm and dry.  Psychiatric: She has a normal mood and affect. Her behavior is normal.          Assessment & Plan:  Myalgias - Suspect fibromyaligia based on her sxs. Will get CRP , Sed rate, and CBC. These will likely be negative which would further support a possible diagnosis of fibromyalgia. Unfortunately she is very tried Cymbalta in the past and did not do well with this. I did encourage her to work on eating a healthy diet and try to get some regular exercise as we know this does help improve fibromyalgia symptoms.  Hypertension not well controlled but we'll try her on some new blood pressure medications as she has not tolerated but I think we are stuck with using the current medication. I did send her a new rx for losartan for her to try in place of the valsartan which would be more cost effective for her. She has not picked up yet, but plans to this week and she will me  If she tolerates as well.

## 2011-11-27 ENCOUNTER — Telehealth: Payer: Self-pay | Admitting: Family Medicine

## 2011-11-27 LAB — C-REACTIVE PROTEIN: CRP: 0.91 mg/dL — ABNORMAL HIGH (ref <0.60)

## 2011-11-27 LAB — CBC WITH DIFFERENTIAL/PLATELET
Basophils Absolute: 0 10*3/uL (ref 0.0–0.1)
Lymphocytes Relative: 34 % (ref 12–46)
Lymphs Abs: 1.7 10*3/uL (ref 0.7–4.0)
Neutro Abs: 2.5 10*3/uL (ref 1.7–7.7)
Platelets: 250 10*3/uL (ref 150–400)
RBC: 4.48 MIL/uL (ref 3.87–5.11)
RDW: 12.8 % (ref 11.5–15.5)
WBC: 5.1 10*3/uL (ref 4.0–10.5)

## 2011-11-27 LAB — SEDIMENTATION RATE: Sed Rate: 7 mm/hr (ref 0–22)

## 2011-11-27 NOTE — Telephone Encounter (Signed)
Patient called left messg voicemail about blood test results and if she need to change her meds or do anything different since she tested positive Myalgia..Patient request a call back

## 2011-11-27 NOTE — Telephone Encounter (Signed)
Prt called and wanted to know if there was anything different you wanted to do since the CRP was up and wanted to know if you had a chance to look at the disc she brought you of her MRI

## 2011-11-27 NOTE — Telephone Encounter (Signed)
No further work up for CRP needed.  I haven't looked at her images yet.

## 2012-01-07 ENCOUNTER — Ambulatory Visit: Payer: Self-pay | Admitting: Endocrinology

## 2012-01-27 ENCOUNTER — Other Ambulatory Visit: Payer: Self-pay | Admitting: *Deleted

## 2012-01-27 MED ORDER — VALSARTAN-HYDROCHLOROTHIAZIDE 320-25 MG PO TABS: 1.0000 | ORAL_TABLET | Freq: Every day | ORAL | Status: DC

## 2012-01-27 NOTE — Telephone Encounter (Signed)
Pt called and states that she is not going to take the Cozaar anymore b/c of the chest discomfort and chest cramping. States she has taken the Cozaar x 2months.states she will come see you when she gets hers taxes. She also wants a refill on Diovan but I see an allergy/contraindication on her chart. Please advise.

## 2012-02-07 ENCOUNTER — Ambulatory Visit (INDEPENDENT_AMBULATORY_CARE_PROVIDER_SITE_OTHER): Payer: Self-pay | Admitting: Physician Assistant

## 2012-02-07 ENCOUNTER — Encounter: Payer: Self-pay | Admitting: Physician Assistant

## 2012-02-07 ENCOUNTER — Other Ambulatory Visit: Payer: Self-pay | Admitting: Physician Assistant

## 2012-02-07 VITALS — BP 153/87 | HR 80 | Temp 97.8°F | Ht 63.0 in | Wt 234.0 lb

## 2012-02-07 DIAGNOSIS — F32A Depression, unspecified: Secondary | ICD-10-CM

## 2012-02-07 DIAGNOSIS — N898 Other specified noninflammatory disorders of vagina: Secondary | ICD-10-CM

## 2012-02-07 DIAGNOSIS — I10 Essential (primary) hypertension: Secondary | ICD-10-CM

## 2012-02-07 DIAGNOSIS — F329 Major depressive disorder, single episode, unspecified: Secondary | ICD-10-CM

## 2012-02-07 DIAGNOSIS — F341 Dysthymic disorder: Secondary | ICD-10-CM

## 2012-02-07 NOTE — Progress Notes (Signed)
  Subjective:    Patient ID: Alice Stewart, female    DOB: 1956/10/12, 56 y.o.   MRN: [id number]  HPI Patient presents to clinic with chest tightness at night. She has recently had lots of problems taking any blood pressure medication. In December she had to go off Cozaar because it made her sick. She started back on the diovan 320/25. She has not taken this medication today. She normal takes this medication in the morning. She has no symptoms until late at night and then she has complete body tightness like she is being squeezed. She can hear her pulse in her head. She does not take anything when she has these episodes. She does not have a way to check her blood pressure at home. Lately she has been having them every night. She denies anxiety or depression; however, she talks about how her mind runs all the time worrying and she is currently down about her weight and her husband not being able to have sex with her. She denies and chest pains, palpitations, SOB, or wheezing. She does not have them during the day at all they are always at night.    Patient has been having some discharge that smells for the past 3 days. She denies any pain or discomfort. She denies itching or burning.She has had bacterial vaginosis in the past. She has not ran a fever.   Review of Systems     Objective:   Physical Exam  Constitutional: She is oriented to person, place, and time. She appears well-developed and well-nourished.  HENT:  Head: Normocephalic and atraumatic.  Neck: Normal range of motion. Neck supple.  Cardiovascular: Normal rate, regular rhythm and intact distal pulses.   Murmur heard.      Systolic ejection murmur 4/6 best heard in aortic region.  Pulmonary/Chest: Effort normal and breath sounds normal. She has no wheezes.  Abdominal: Soft. Bowel sounds are normal. She exhibits no distension. There is no tenderness.  Genitourinary: Vaginal discharge found.       Grey milky discharge with a foul  odor.  Neurological: She is alert and oriented to person, place, and time.  Skin: Skin is warm and dry.  Psychiatric:       Anxious during encounter.           Assessment & Plan:  Hypertension- EKG with no changes. She has not taken blood pressure medications today.Instructed patient to stay on blood pressure medication daily. Been on Diovan for 2 weeks lets give it more time. Will recheck when on medication in 4-6 weeks. Patient has had extensive cardaic work-up with not cause of chest pain and tightness found to be cardaic. Consider buying a blood pressure cuff to take bp at night.  Anxiety and Depression- PHQ-9:12 and GAD-7:6. Offer klonapin to take when needed for chest tightness at night and anxiety. Patient did not want this medication. Gave sample pack of Viibryd to try. Follow up in 4-6weeks. Do not let samples run out before calling if doing well or not. If not doing well we will taper you down if doing well I will give you a prescription.  Vaginal discharge- Wet prep sent off. Will call with results.

## 2012-02-07 NOTE — Patient Instructions (Addendum)
Start Viibryd. Gave samples. F/U in one month. Continue on Diovan. Consider buying a blood pressure cuff to take bp at night. Will call with wet prep results.

## 2012-02-10 LAB — WET PREP, GENITAL
Clue Cells Wet Prep HPF POC: NONE SEEN
Trich, Wet Prep: NONE SEEN
Yeast Wet Prep HPF POC: NONE SEEN

## 2012-02-11 ENCOUNTER — Telehealth: Payer: Self-pay | Admitting: *Deleted

## 2012-02-11 ENCOUNTER — Encounter: Payer: Self-pay | Admitting: *Deleted

## 2012-02-11 NOTE — Telephone Encounter (Signed)
Can come by and get pristiq samples and let me know if you tolerate enough to give prescription for.

## 2012-02-11 NOTE — Telephone Encounter (Signed)
Pt states that she can't take the Viibryd that you gave her samples of. States that it has upset her stomach and it makes her teeth feel numb and makes her moody. States she has taken it for 3 days. Would like to try samples of something else before you send anything to pharmacy due to she has to pay out of pocket. Will come by to pickup samples if you have any of something else. Please advise.

## 2012-02-11 NOTE — Telephone Encounter (Signed)
Pt informed to come by and p/u samples

## 2012-02-21 ENCOUNTER — Telehealth: Payer: Self-pay | Admitting: *Deleted

## 2012-02-21 MED ORDER — DESVENLAFAXINE SUCCINATE ER 50 MG PO TB24: 50.0000 mg | ORAL_TABLET | Freq: Every day | ORAL | Status: DC

## 2012-02-21 NOTE — Telephone Encounter (Signed)
I gave pt the last Prestiq samples we had with a 14 day free trial of them. Please send rx to pharmacy so pt can get free trial. Asked pt if she would rather for [place] to call in something different that was more affordable and pt declined.

## 2012-02-21 NOTE — Telephone Encounter (Signed)
rx sent for 14 days.

## 2012-02-28 ENCOUNTER — Other Ambulatory Visit: Payer: Self-pay | Admitting: Obstetrics and Gynecology

## 2012-02-28 ENCOUNTER — Telehealth: Payer: Self-pay | Admitting: *Deleted

## 2012-02-28 DIAGNOSIS — N6320 Unspecified lump in the left breast, unspecified quadrant: Secondary | ICD-10-CM

## 2012-02-28 NOTE — Telephone Encounter (Signed)
Called patient at [phone] to remind her that she is due for a follow up left breast diagnostic mammogram. Patient concerned about cost and stated she is no longer eligible for the discount. Explained to patient this is covered through the [facility] program. Asked patient if she still has her pink card and she stated she does. Attempted to give patient phone number to schedule follow up appointment but patient requested that I schedule for her and gave me dates that are good for her. Will schedule appointment and call patient back. Patient verbalized understanding.  Called the [facility] and scheduled follow up appointment for Monday, March 09, 2012 at 1050. Called patient back and gave her appointment. Reminded patient to show her pink card when goes to follow up appointment. Patient stated will be there. Patient verbalized understanding.

## 2012-03-03 ENCOUNTER — Telehealth: Payer: Self-pay | Admitting: *Deleted

## 2012-03-03 NOTE — Telephone Encounter (Signed)
Pt called and states the medication that you gave samples and a rx for( she didn't say what was given) she cannot take. She said she had some side effects. Pt also states she is still taking the Diovan 320mg  and she has canceled her appt that she had coming up because she cant afford to come in

## 2012-03-06 ENCOUNTER — Ambulatory Visit: Payer: Self-pay | Admitting: Family Medicine

## 2012-03-11 ENCOUNTER — Ambulatory Visit
Admission: RE | Admit: 2012-03-11 | Discharge: 2012-03-11 | Disposition: A | Discharge status: Home / Self Care | Payer: No Typology Code available for payment source | Source: Ambulatory Visit | Attending: Obstetrics and Gynecology | Admitting: Obstetrics and Gynecology

## 2012-03-11 DIAGNOSIS — N6320 Unspecified lump in the left breast, unspecified quadrant: Secondary | ICD-10-CM

## 2012-03-17 ENCOUNTER — Telehealth: Payer: Self-pay | Admitting: *Deleted

## 2012-03-17 NOTE — Telephone Encounter (Signed)
Telephoned pt at home #. Advised pt of benign biopsy results and that recommend bilateral diagnostic mammogram in September 2013. Pt was aware. Pt gave new address as [address] Boneta Lucks [address] [place] [address].

## 2012-08-16 ENCOUNTER — Other Ambulatory Visit: Payer: Self-pay | Admitting: Obstetrics & Gynecology

## 2012-09-18 ENCOUNTER — Telehealth: Payer: Self-pay | Admitting: *Deleted

## 2012-09-18 NOTE — Telephone Encounter (Signed)
Pt calls and states that she needs a letter stating t5hat she has fibromyalgia and she can not work due to this. Needs to state what this does to her mentally and physically per her. She is applying for an apartment that only accepts diability and also has court date for disability hearing on 12/23/2012. Mail to pt home.

## 2012-09-18 NOTE — Telephone Encounter (Signed)
Patient called requesting disability letter for Fibromyalgia, how it affects her physically and mentally Has a disability hearing in Jan 2014, has no insurance to come in.

## 2012-09-21 ENCOUNTER — Telehealth: Payer: Self-pay | Admitting: *Deleted

## 2012-09-21 NOTE — Telephone Encounter (Signed)
Pt has called asking if you will write a letter for her stating that she has fibromyalgia and how it affects her. States she needs for part of her disability. She would like to have it mailed to [address] [place], [place] [address].

## 2012-09-24 ENCOUNTER — Encounter (HOSPITAL_COMMUNITY): Payer: Self-pay | Admitting: *Deleted

## 2012-09-24 ENCOUNTER — Telehealth: Payer: Self-pay | Admitting: *Deleted

## 2012-09-24 NOTE — Telephone Encounter (Signed)
Patient advised Md will let her know after she reviews info

## 2012-09-24 NOTE — Telephone Encounter (Signed)
I will look into it. Unfortunately I didn't get to it yesterday afternoon.

## 2012-09-24 NOTE — Telephone Encounter (Signed)
Patient called again today requesting letter for disability and for an apartment.

## 2012-09-30 ENCOUNTER — Encounter: Payer: Self-pay | Admitting: Family Medicine

## 2012-09-30 ENCOUNTER — Telehealth: Payer: Self-pay | Admitting: *Deleted

## 2012-09-30 DIAGNOSIS — M797 Fibromyalgia: Secondary | ICD-10-CM | POA: Insufficient documentation

## 2012-09-30 NOTE — Telephone Encounter (Signed)
Tried to call pt. Received a sound as if phone is disconnected.

## 2012-09-30 NOTE — Telephone Encounter (Signed)
Please let patient know that I did write a letter for her and that we are mailing it to the address below.

## 2012-10-01 ENCOUNTER — Other Ambulatory Visit: Payer: Self-pay | Admitting: Obstetrics & Gynecology

## 2012-10-06 ENCOUNTER — Ambulatory Visit (HOSPITAL_COMMUNITY): Payer: Self-pay

## 2012-11-04 NOTE — Telephone Encounter (Signed)
See other message

## 2012-12-23 LAB — HM COLONOSCOPY

## 2014-02-11 DIAGNOSIS — I428 Other cardiomyopathies: Secondary | ICD-10-CM | POA: Insufficient documentation

## 2014-02-22 DIAGNOSIS — Z95810 Presence of automatic (implantable) cardiac defibrillator: Secondary | ICD-10-CM | POA: Insufficient documentation

## 2014-02-22 DIAGNOSIS — Z950 Presence of cardiac pacemaker: Secondary | ICD-10-CM | POA: Insufficient documentation

## 2014-02-22 HISTORY — DX: Presence of automatic (implantable) cardiac defibrillator: Z95.810

## 2014-10-24 ENCOUNTER — Encounter: Payer: Self-pay | Admitting: Physician Assistant

## 2015-03-22 DIAGNOSIS — K7460 Unspecified cirrhosis of liver: Secondary | ICD-10-CM | POA: Insufficient documentation

## 2015-03-22 DIAGNOSIS — Z950 Presence of cardiac pacemaker: Secondary | ICD-10-CM | POA: Insufficient documentation

## 2015-03-22 DIAGNOSIS — K760 Fatty (change of) liver, not elsewhere classified: Secondary | ICD-10-CM | POA: Insufficient documentation

## 2015-12-08 DIAGNOSIS — N3946 Mixed incontinence: Secondary | ICD-10-CM | POA: Insufficient documentation

## 2017-11-25 DIAGNOSIS — I493 Ventricular premature depolarization: Secondary | ICD-10-CM | POA: Insufficient documentation

## 2018-01-27 DIAGNOSIS — E782 Mixed hyperlipidemia: Secondary | ICD-10-CM | POA: Insufficient documentation

## 2018-01-27 DIAGNOSIS — Z8679 Personal history of other diseases of the circulatory system: Secondary | ICD-10-CM

## 2018-01-27 HISTORY — DX: Personal history of other diseases of the circulatory system: Z86.79

## 2018-02-04 DIAGNOSIS — R7303 Prediabetes: Secondary | ICD-10-CM | POA: Insufficient documentation

## 2018-06-30 DIAGNOSIS — F32A Depression, unspecified: Secondary | ICD-10-CM | POA: Insufficient documentation

## 2020-07-27 LAB — HM MAMMOGRAPHY

## 2020-08-18 LAB — HM PAP SMEAR: HM Pap smear: NEGATIVE

## 2021-05-28 ENCOUNTER — Encounter: Payer: Self-pay | Admitting: Emergency Medicine

## 2021-05-28 ENCOUNTER — Emergency Department (INDEPENDENT_AMBULATORY_CARE_PROVIDER_SITE_OTHER)
Admission: EM | Admit: 2021-05-28 | Discharge: 2021-05-28 | Disposition: A | Discharge status: Home / Self Care | Payer: Medicare Other | Source: Home / Self Care

## 2021-05-28 ENCOUNTER — Other Ambulatory Visit (HOSPITAL_COMMUNITY)
Admission: RE | Admit: 2021-05-28 | Discharge: 2021-05-28 | Disposition: A | Discharge status: Home / Self Care | Payer: Medicare Other | Source: Ambulatory Visit | Attending: Family Medicine | Admitting: Family Medicine

## 2021-05-28 ENCOUNTER — Other Ambulatory Visit: Payer: Self-pay

## 2021-05-28 DIAGNOSIS — R300 Dysuria: Secondary | ICD-10-CM | POA: Diagnosis not present

## 2021-05-28 DIAGNOSIS — N898 Other specified noninflammatory disorders of vagina: Secondary | ICD-10-CM | POA: Insufficient documentation

## 2021-05-28 DIAGNOSIS — Z202 Contact with and (suspected) exposure to infections with a predominantly sexual mode of transmission: Secondary | ICD-10-CM | POA: Diagnosis present

## 2021-05-28 DIAGNOSIS — B3731 Acute candidiasis of vulva and vagina: Secondary | ICD-10-CM

## 2021-05-28 DIAGNOSIS — R310 Gross hematuria: Secondary | ICD-10-CM

## 2021-05-28 DIAGNOSIS — B373 Candidiasis of vulva and vagina: Secondary | ICD-10-CM

## 2021-05-28 LAB — POCT URINALYSIS DIP (MANUAL ENTRY)
Bilirubin, UA: NEGATIVE
Glucose, UA: NEGATIVE mg/dL
Ketones, POC UA: NEGATIVE mg/dL
Nitrite, UA: NEGATIVE
Protein Ur, POC: NEGATIVE mg/dL
Spec Grav, UA: 1.015 (ref 1.010–1.025)
Urobilinogen, UA: 0.2 E.U./dL
pH, UA: 6 (ref 5.0–8.0)

## 2021-05-28 MED ORDER — NITROFURANTOIN MONOHYD MACRO 100 MG PO CAPS: 100.0000 mg | ORAL_CAPSULE | Freq: Two times a day (BID) | ORAL | 0 refills | Status: DC

## 2021-05-28 MED ORDER — FLUCONAZOLE 200 MG PO TABS: 200.0000 mg | ORAL_TABLET | Freq: Every day | ORAL | 0 refills | Status: AC

## 2021-05-28 NOTE — ED Triage Notes (Signed)
Vaginal discharge, itching, used Monistat got better but still having discharge, burning itching twinge of blood. Unprotected sex recently.

## 2021-05-28 NOTE — Discharge Instructions (Addendum)
Instructed patient to take medication as directed to completion.  Encourage patient to increase daily water intake while taking this medication.  Advised patient we would follow-up with lab results once received.

## 2021-05-28 NOTE — ED Provider Notes (Signed)
[facility]    CSN: [id number] Arrival date & time: 05/28/21  1029      History   Chief Complaint Chief Complaint  Patient presents with  . Vaginal Discharge    HPI Alice Stewart is a 65 y.o. female.   HPI 65 year old female presents with vaginal discharge, vaginal itching burning urination with trace blood noted for two weeks.  Additionally, patient request STD testing for recent unprotected sex.  Past Medical History:  Diagnosis Date  . Anemia   . Back pain   . Benign positional vertigo   . Carpal tunnel syndrome   . Goiter    nontoxid mutinodular  . Hypertension   . Hyperthyroidism   . Insulin resistance syndrome   . Menopausal disorder   . Menorrhagia   . Migraine   . Muscle spasm    trapezius  . Neck pain, acute   . Obesity   . Seborrheic dermatitis   . Thyroid disorder     Patient Active Problem List   Diagnosis Date Noted  . Fibromyalgia 09/30/2012  . HYPERTHYROIDISM 02/18/2011  . BENIGN POSITIONAL VERTIGO 01/03/2011  . ANEMIA 02/25/2008  . MENORRHAGIA 02/25/2008  . MENOPAUSAL DISORDER 08/10/2007  . OBESITY NOS 03/05/2007  . BACK PAIN 03/05/2007  . CARPAL TUNNEL SYNDROME 01/26/2007  . MUSCLE SPASM, TRAPEZIUS 01/26/2007  . MIGRAINE, COMMON W/O INTRACTABLE MIGRAINE 10/29/2006  . GOITER, NONTOXIC MULTINODULAR 10/06/2006  . INSULIN RESISTANCE SYNDROME 10/06/2006  . HYPERCHOLESTEROLEMIA 09/30/2006  . HYPERTENSION, BENIGN SYSTEMIC 09/30/2006  . SEBORRHEIC DERMATITIS, NOS 09/30/2006    Past Surgical History:  Procedure Laterality Date  . DILATION AND CURETTAGE OF UTERUS  04/09  . ENDOMETRIAL ABLATION  04/09  . GREAT TOE ARTHRODESIS, METATARSALPHALANGEAL JOINT  6/07   Rt foot  . HEEL SPUR SURGERY  11/87   Lt foot  . TUBAL LIGATION  11/90    OB History    Gravida  4   Para  3   Term  3   Preterm      AB  1   Living  3     SAB      IAB  1   Ectopic      Multiple      Live Births               Home  Medications    Prior to Admission medications   Medication Sig Start Date End Date Taking? Authorizing Provider  fluconazole (DIFLUCAN) 200 MG tablet Take 1 tablet (200 mg total) by mouth daily for 2 days. 05/28/21 05/30/21 Yes Trevor Iha, FNP  furosemide (LASIX) 20 MG tablet Take 20 mg by mouth.   Yes [provider]  nitrofurantoin, macrocrystal-monohydrate, (MACROBID) 100 MG capsule Take 1 capsule (100 mg total) by mouth 2 (two) times daily for 7 days. 05/28/21 06/04/21 Yes Trevor Iha, FNP  Ascorbic Acid (VITAMIN C) 1000 MG tablet Take 1,000 mg by mouth daily.      [provider]  Black Cohosh 450 MG CAPS Take 1 capsule by mouth daily.     [provider]  Calcium 1500 MG tablet Take 1,500 mg by mouth.      [provider]  Evening Primrose Oil 1000 MG CAPS Take 1 capsule by mouth daily.      [provider]  Multiple Vitamin (MULTIVITAMIN) tablet Take 1 tablet by mouth daily.      [provider]  Red Clover Leaf Extract 500 MG TABS Take 1 tablet  by mouth daily.      [provider]  valsartan-hydrochlorothiazide (DIOVAN-HCT) 320-25 MG per tablet Take 1 tablet by mouth daily. 01/27/12   Agapito Games, MD  VITAMIN D, ERGOCALCIFEROL, PO Take 1,000 mg by mouth daily.     [provider]    Family History Family History  Problem Relation Age of Onset  . Other Mother        MS  . Hypertension Father   . Hypothyroidism Sister   . Hypertension Sister   . Diabetes Other     Social History Social History   Tobacco Use  . Smoking status: Former Smoker    Quit date: 09/22/2004    Years since quitting: 16.6  . Smokeless tobacco: Never Used  Vaping Use  . Vaping Use: Never used  Substance Use Topics  . Alcohol use: Yes    Alcohol/week: 1.0 standard drink    Types: 1 Standard drinks or equivalent per week    Comment: 1 weekly  . Drug use: No     Allergies   Amlodipine-atorvastatin, Bystolic [nebivolol  hcl], Chlorthalidone, Cymbalta [duloxetine hcl], Diltiazem hcl, Hydrochlorothiazide, Lisinopril, Losartan, Metoprolol tartrate, and Simvastatin   Review of Systems Review of Systems  Constitutional: Negative.   HENT: Negative.   Eyes: Negative.   Respiratory: Negative.   Cardiovascular: Negative.   Gastrointestinal: Negative.   Genitourinary: Positive for dysuria and vaginal discharge.       Vaginal itching  Musculoskeletal: Negative.   Skin: Negative.   Neurological: Negative.      Physical Exam Triage Vital Signs ED Triage Vitals  Enc Vitals Group     BP 05/28/21 1108 (!) 173/76     Pulse Rate 05/28/21 1108 67     Resp 05/28/21 1108 18     Temp 05/28/21 1108 98.4 F (36.9 C)     Temp Source 05/28/21 1108 Oral     SpO2 05/28/21 1108 98 %     Weight 05/28/21 1109 220 lb (99.8 kg)     Height 05/28/21 1109 5\' 4"  (1.626 m)     Head Circumference --      Peak Flow --      Pain Score 05/28/21 1109 3     Pain Loc --      Pain Edu? --      Excl. in GC? --    No data found.  Updated Vital Signs BP (!) 173/76 (BP Location: Right Arm)   Pulse 67   Temp 98.4 F (36.9 C) (Oral)   Resp 18   Ht 5\' 4"  (1.626 m)   Wt 220 lb (99.8 kg)   LMP 09/27/2011   SpO2 98%   BMI 37.76 kg/m      Physical Exam Vitals and nursing note reviewed.  Constitutional:      General: She is not in acute distress.    Appearance: Normal appearance. She is obese. She is not ill-appearing.  HENT:     Head: Normocephalic and atraumatic.     Mouth/Throat:     Mouth: Mucous membranes are moist.     Pharynx: Oropharynx is clear.  Eyes:     Extraocular Movements: Extraocular movements intact.     Conjunctiva/sclera: Conjunctivae normal.     Pupils: Pupils are equal, round, and reactive to light.  Cardiovascular:     Rate and Rhythm: Normal rate and regular rhythm.     Pulses: Normal pulses.     Heart sounds: Normal heart sounds.  Pulmonary:  Effort: Pulmonary effort is normal. No  respiratory distress.     Breath sounds: Normal breath sounds. No wheezing, rhonchi or rales.  Abdominal:     General: Bowel sounds are normal. There is no distension.     Palpations: Abdomen is soft.     Tenderness: There is no abdominal tenderness. There is no right CVA tenderness, left CVA tenderness, guarding or rebound.  Musculoskeletal:        General: Normal range of motion.     Cervical back: Neck supple. No tenderness.  Lymphadenopathy:     Cervical: No cervical adenopathy.  Skin:    General: Skin is warm and dry.  Neurological:     General: No focal deficit present.     Mental Status: She is alert and oriented to person, place, and time.  Psychiatric:        Mood and Affect: Mood normal.        Behavior: Behavior normal.      UC Treatments / Results  Labs (all labs ordered are listed, but only abnormal results are displayed) Labs Reviewed  POCT URINALYSIS DIP (MANUAL ENTRY) - Abnormal; Notable for the following components:      Result Value   Color, UA light yellow (*)    Clarity, UA cloudy (*)    Blood, UA moderate (*)    Leukocytes, UA Large (3+) (*)    All other components within normal limits  URINE CULTURE  RPR  HIV ANTIBODY (ROUTINE TESTING W REFLEX)  HSV 1/2 AB (IGM), IFA W/RFLX TITER  HEPATITIS C ANTIBODY    EKG   Radiology No results found.  Procedures Procedures (including critical care time)  Medications Ordered in UC Medications - No data to display  Initial Impression / Assessment and Plan / UC Course  I have reviewed the triage vital signs and the nursing notes.  Pertinent labs & imaging results that were available during my care of the patient were reviewed by me and considered in my medical decision making (see chart for details).     MDM: 1.  Dysuria, 2.  Vaginal discharge, 3. Hematuria, 4. STD screen.  Patient discharged home, hemodynamically stable Final Clinical Impressions(s) / UC Diagnoses   Final diagnoses:  Vaginal  discharge  STD exposure  Dysuria  Vaginal yeast infection  Hematuria, gross     Discharge Instructions     Instructed patient to take medication as directed to completion.  Encourage patient to increase daily water intake while taking this medication.  Advised patient we would follow-up with lab results once received.    ED Prescriptions    Medication Sig Dispense Auth. Provider   nitrofurantoin, macrocrystal-monohydrate, (MACROBID) 100 MG capsule Take 1 capsule (100 mg total) by mouth 2 (two) times daily for 7 days. 14 capsule Trevor Iha, FNP   fluconazole (DIFLUCAN) 200 MG tablet Take 1 tablet (200 mg total) by mouth daily for 2 days. 2 tablet Trevor Iha, FNP     PDMP not reviewed this encounter.   Trevor Iha, FNP 05/28/21 1211

## 2021-05-29 LAB — CERVICOVAGINAL ANCILLARY ONLY
Bacterial Vaginitis (gardnerella): NEGATIVE
Candida Glabrata: NEGATIVE
Candida Vaginitis: NEGATIVE
Chlamydia: POSITIVE — AB
Comment: NEGATIVE
Comment: NEGATIVE
Comment: NEGATIVE
Comment: NEGATIVE
Comment: NEGATIVE
Comment: NORMAL
Neisseria Gonorrhea: NEGATIVE
Trichomonas: POSITIVE — AB

## 2021-05-30 ENCOUNTER — Telehealth: Payer: Self-pay | Admitting: Emergency Medicine

## 2021-05-30 ENCOUNTER — Telehealth (HOSPITAL_COMMUNITY): Payer: Self-pay | Admitting: Emergency Medicine

## 2021-05-30 LAB — URINE CULTURE
MICRO NUMBER:: 11972617
SPECIMEN QUALITY:: ADEQUATE

## 2021-05-30 MED ORDER — DOXYCYCLINE HYCLATE 100 MG PO CAPS: 100.0000 mg | ORAL_CAPSULE | Freq: Two times a day (BID) | ORAL | 0 refills | Status: AC

## 2021-05-30 MED ORDER — METRONIDAZOLE 500 MG PO TABS: 500.0000 mg | ORAL_TABLET | Freq: Two times a day (BID) | ORAL | 0 refills | Status: DC

## 2021-05-30 MED ORDER — CEPHALEXIN 500 MG PO CAPS: 500.0000 mg | ORAL_CAPSULE | Freq: Two times a day (BID) | ORAL | 0 refills | Status: AC

## 2021-05-30 NOTE — Telephone Encounter (Signed)
Call back to pharmacy regarding questions about antibiotics. RN confirmed via voice mail that Macrobid was not to be filled or taken. Pt was to take cephalexin, flagyl and doxycycline as prescribed. Call back # left for pharmacy if they have further questions. Medication changes had already been discussed w/ patient earlier today. Please see previous note from Oran Rein, RN

## 2021-06-05 ENCOUNTER — Telehealth: Payer: Self-pay | Admitting: Emergency Medicine

## 2021-06-05 LAB — HIV ANTIBODY (ROUTINE TESTING W REFLEX): HIV 1&2 Ab, 4th Generation: NONREACTIVE

## 2021-06-05 LAB — HSV 1/2 AB (IGM), IFA W/RFLX TITER
HSV 1 IgM Screen: NEGATIVE
HSV 2 IgM Screen: NEGATIVE

## 2021-06-05 LAB — HEPATITIS C ANTIBODY
Hepatitis C Ab: NONREACTIVE
SIGNAL TO CUT-OFF: 0.02 (ref <1.00)

## 2021-06-05 LAB — RPR: RPR Ser Ql: NONREACTIVE

## 2021-06-05 NOTE — Telephone Encounter (Signed)
Patient informed of negative bloodwork, I.e, RPR, Hep C and HIV.  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  Patient has been treated for other infections.  Patient voices understanding.

## 2021-06-13 ENCOUNTER — Ambulatory Visit (INDEPENDENT_AMBULATORY_CARE_PROVIDER_SITE_OTHER): Payer: Medicare Other | Admitting: Family Medicine

## 2021-06-13 ENCOUNTER — Other Ambulatory Visit: Payer: Self-pay

## 2021-06-13 ENCOUNTER — Encounter: Payer: Self-pay | Admitting: Family Medicine

## 2021-06-13 VITALS — BP 175/59 | HR 61 | Temp 97.4°F | Ht 62.99 in | Wt 246.5 lb

## 2021-06-13 DIAGNOSIS — Z113 Encounter for screening for infections with a predominantly sexual mode of transmission: Secondary | ICD-10-CM | POA: Diagnosis not present

## 2021-06-13 DIAGNOSIS — I429 Cardiomyopathy, unspecified: Secondary | ICD-10-CM | POA: Diagnosis not present

## 2021-06-13 DIAGNOSIS — Z950 Presence of cardiac pacemaker: Secondary | ICD-10-CM | POA: Diagnosis not present

## 2021-06-13 DIAGNOSIS — I10 Essential (primary) hypertension: Secondary | ICD-10-CM | POA: Diagnosis not present

## 2021-06-13 DIAGNOSIS — Z466 Encounter for fitting and adjustment of urinary device: Secondary | ICD-10-CM | POA: Insufficient documentation

## 2021-06-13 DIAGNOSIS — Z8619 Personal history of other infectious and parasitic diseases: Secondary | ICD-10-CM | POA: Diagnosis not present

## 2021-06-13 MED ORDER — FLUCONAZOLE 150 MG PO TABS: 150.0000 mg | ORAL_TABLET | Freq: Once | ORAL | 0 refills | Status: DC

## 2021-06-13 NOTE — Assessment & Plan Note (Signed)
Referral placed to cardiology for pacemaker and flecainide management.

## 2021-06-13 NOTE — Assessment & Plan Note (Signed)
BP elevated today.  Hasn't taken losartan in a couple of days.  She will restart this.  Low sodium diet recommended.

## 2021-06-13 NOTE — Progress Notes (Signed)
Alice Stewart - 65 y.o. female MRN [id number]  Date of birth: February 01, 1956  Subjective Chief Complaint  Patient presents with   Establish Care    HPI Alice Stewart is a 65 y.o. female here today for initial visit to establish care.  She recently moved to the area from [place].  She was seen at urgent care recently and diagnosed with chlamydia and trichomonas.  She was treated with antibiotics and developed yeast vaginitis afterwards.  This was treated with diflucan.  Reports that she continues to have discharge and would like retesting.  She reports that her current partner tested negative.    She also has a history of cardiomyopathy.  She is on flecainide and has BI-V pacemaker.  Battery is reaching end of life.  She needs to establish with cardiology here.    ROS:  A comprehensive ROS was completed and negative except as noted per HPI  Allergies  Allergen Reactions   Bystolic [Nebivolol Hcl] Other (See Comments)    Nausea.    Amlodipine-Atorvastatin Other (See Comments)    salty taste in mouth, stomach burning, H/A   Chlorthalidone Nausea Only    Headache   Diltiazem Hcl Other (See Comments)    Dizzy   Lisinopril     backache   Metoprolol Tartrate Other (See Comments)    nausea,HA, dizziness   Simvastatin Other (See Comments)    Myalgia     Past Medical History:  Diagnosis Date   Anemia    Back pain    Benign positional vertigo    Carpal tunnel syndrome    Goiter    nontoxid mutinodular   Hypertension    Hyperthyroidism    Insulin resistance syndrome    Menopausal disorder    Menorrhagia    Migraine    Muscle spasm    trapezius   Neck pain, acute    Obesity    Seborrheic dermatitis    Thyroid disorder     Past Surgical History:  Procedure Laterality Date   DILATION AND CURETTAGE OF UTERUS  04/09   ENDOMETRIAL ABLATION  04/09   GREAT TOE ARTHRODESIS, METATARSALPHALANGEAL JOINT  6/07   Rt foot   HEEL SPUR SURGERY  11/87   Lt foot   TUBAL LIGATION   11/90    Social History   Socioeconomic History   Marital status: Single    Spouse name: Not on file   Number of children: 3   Years of education: 14   Highest education level: Not on file  Occupational History   Occupation: unemployed    Employer: [id number]  Tobacco Use   Smoking status: Former    Pack years: 0.00    Types: Cigarettes    Quit date: 09/22/2004    Years since quitting: 16.7   Smokeless tobacco: Never  Vaping Use   Vaping Use: Never used  Substance and Sexual Activity   Alcohol use: Yes    Alcohol/week: 1.0 standard drink    Types: 1 Standard drinks or equivalent per week    Comment: 1 weekly   Drug use: No   Sexual activity: Yes  Other Topics Concern   Not on file  Social History Narrative   3-4 caffeinated drinks per day   No regular exercise   Social Determinants of Health   Financial Resource Strain: Not on file  Food Insecurity: Not on file  Transportation Needs: Not on file  Physical Activity: Not on file  Stress: Not on file  Social  Connections: Not on file    Family History  Problem Relation Age of Onset   Other Mother        MS   Hypertension Father    Hypothyroidism Sister    Hypertension Sister    Diabetes Other     Health Maintenance  Topic Date Due   Pneumococcal Vaccine 26-51 Years old (1 - PCV) Never done   Zoster Vaccines- Shingrix (1 of 2) Never done   COLONOSCOPY (Pts 45-70yrs Insurance coverage will need to be confirmed)  Never done   MAMMOGRAM  03/11/2014   PAP SMEAR-Modifier  08/05/2014   COVID-19 Vaccine (4 - Booster for Moderna series) 02/21/2021   INFLUENZA VACCINE  07/23/2021   TETANUS/TDAP  08/20/2023   Hepatitis C Screening  Completed   HIV Screening  Completed   HPV VACCINES  Aged Out     ----------------------------------------------------------------------------------------------------------------------------------------------------------------------------------------------------------------- Physical  Exam BP (!) 175/59 (BP Location: Left Arm, Patient Position: Sitting, Cuff Size: Large)   Pulse 61   Temp (!) 97.4 F (36.3 C)   Ht 5' 2.99" (1.6 m)   Wt 246 lb 8 oz (111.8 kg)   LMP 09/27/2011   SpO2 97%   BMI 43.68 kg/m   Physical Exam Constitutional:      Appearance: Normal appearance.  HENT:     Head: Normocephalic and atraumatic.  Eyes:     General: No scleral icterus. Cardiovascular:     Rate and Rhythm: Normal rate and regular rhythm.  Pulmonary:     Effort: Pulmonary effort is normal.     Breath sounds: Normal breath sounds.  Abdominal:     General: Abdomen is flat.     Palpations: Abdomen is soft.  Musculoskeletal:     Cervical back: Neck supple.  Neurological:     General: No focal deficit present.     Mental Status: She is alert.  Psychiatric:        Mood and Affect: Mood normal.        Behavior: Behavior normal.    ------------------------------------------------------------------------------------------------------------------------------------------------------------------------------------------------------------------- Assessment and Plan  HYPERTENSION, BENIGN SYSTEMIC BP elevated today.  Hasn't taken losartan in a couple of days.  She will restart this.  Low sodium diet recommended.     Presence of cardiac pacemaker Referral placed to cardiology for pacemaker and flecainide management.   Screening examination for STD (sexually transmitted disease) Recent treatment for chlamydia/trichomonas. She also had yeast vaginitis.  Continues to have discharge and irritation.  Will recheck GC/Chlamyda/trichomonas.  Treating empirically with fluconazole again for yeast.    Meds ordered this encounter  Medications   DISCONTD: fluconazole (DIFLUCAN) 150 MG tablet    Sig: Take 1 tablet (150 mg total) by mouth once for 1 dose. May repeat after 72 hours if needed.    Dispense:  2 tablet    Refill:  0    No follow-ups on file.    This visit occurred  during the SARS-CoV-2 public health emergency.  Safety protocols were in place, including screening questions prior to the visit, additional usage of staff PPE, and extensive cleaning of exam room while observing appropriate contact time as indicated for disinfecting solutions.

## 2021-06-13 NOTE — Assessment & Plan Note (Signed)
Recent treatment for chlamydia/trichomonas. She also had yeast vaginitis.  Continues to have discharge and irritation.  Will recheck GC/Chlamyda/trichomonas.  Treating empirically with fluconazole again for yeast.

## 2021-06-15 LAB — CHLAMYDIA/NEISSERIA GONORRHOEAE RNA,TMA,UROGENTIAL
C. trachomatis RNA, TMA: NOT DETECTED
N. gonorrhoeae RNA, TMA: NOT DETECTED

## 2021-06-15 LAB — TRICHOMONAS VAGINALIS, PROBE AMP: Trichomonas vaginalis RNA: NOT DETECTED

## 2021-06-29 ENCOUNTER — Ambulatory Visit (INDEPENDENT_AMBULATORY_CARE_PROVIDER_SITE_OTHER): Payer: Medicare Other | Admitting: Family Medicine

## 2021-06-29 ENCOUNTER — Encounter: Payer: Self-pay | Admitting: Family Medicine

## 2021-06-29 ENCOUNTER — Other Ambulatory Visit (HOSPITAL_COMMUNITY)
Admission: RE | Admit: 2021-06-29 | Discharge: 2021-06-29 | Disposition: A | Discharge status: Home / Self Care | Payer: Medicare Other | Source: Ambulatory Visit | Attending: Family Medicine | Admitting: Family Medicine

## 2021-06-29 ENCOUNTER — Other Ambulatory Visit: Payer: Self-pay

## 2021-06-29 VITALS — BP 133/76 | HR 66 | Ht 64.0 in | Wt 247.0 lb

## 2021-06-29 DIAGNOSIS — N898 Other specified noninflammatory disorders of vagina: Secondary | ICD-10-CM | POA: Insufficient documentation

## 2021-06-29 DIAGNOSIS — Z1151 Encounter for screening for human papillomavirus (HPV): Secondary | ICD-10-CM | POA: Insufficient documentation

## 2021-06-29 DIAGNOSIS — Z01411 Encounter for gynecological examination (general) (routine) with abnormal findings: Secondary | ICD-10-CM | POA: Diagnosis present

## 2021-06-29 NOTE — Progress Notes (Signed)
Alice Stewart - 65 y.o. female MRN [id number]  Date of birth: 10-26-1956  Subjective Chief Complaint  Patient presents with   Vaginal Itching    HPI Alice Stewart is a 65 year old female here today for follow-up of vaginal discharge and irritation.  She was previously diagnosed with chlamydia and trichomonas.  She was treated with antibiotics as well as Diflucan for coverage of yeast however symptoms never fully resolved.  She continues to have vaginal discharge with some irritation and itching.  She denies any bleeding, pelvic pain, fever or chills.  ROS:  A comprehensive ROS was completed and negative except as noted per HPI  Allergies  Allergen Reactions   Bystolic [Nebivolol Hcl] Other (See Comments)    Nausea.    Amlodipine-Atorvastatin Other (See Comments)    salty taste in mouth, stomach burning, H/A   Chlorthalidone Nausea Only    Headache   Diltiazem Hcl Other (See Comments)    Dizzy   Lisinopril     backache   Metoprolol Tartrate Other (See Comments)    nausea,HA, dizziness   Simvastatin Other (See Comments)    Myalgia     Past Medical History:  Diagnosis Date   Anemia    Back pain    Benign positional vertigo    Carpal tunnel syndrome    Goiter    nontoxid mutinodular   Hypertension    Hyperthyroidism    Insulin resistance syndrome    Menopausal disorder    Menorrhagia    Migraine    Muscle spasm    trapezius   Neck pain, acute    Obesity    Seborrheic dermatitis    Thyroid disorder     Past Surgical History:  Procedure Laterality Date   DILATION AND CURETTAGE OF UTERUS  04/09   ENDOMETRIAL ABLATION  04/09   GREAT TOE ARTHRODESIS, METATARSALPHALANGEAL JOINT  6/07   Rt foot   HEEL SPUR SURGERY  11/87   Lt foot   TUBAL LIGATION  11/90    Social History   Socioeconomic History   Marital status: Single    Spouse name: Not on file   Number of children: 3   Years of education: 14   Highest education level: Not on file  Occupational  History   Occupation: unemployed    Employer: [id number]  Tobacco Use   Smoking status: Former    Pack years: 0.00    Types: Cigarettes    Quit date: 09/22/2004    Years since quitting: 16.7   Smokeless tobacco: Never  Vaping Use   Vaping Use: Never used  Substance and Sexual Activity   Alcohol use: Yes    Alcohol/week: 1.0 standard drink    Types: 1 Standard drinks or equivalent per week    Comment: 1 weekly   Drug use: No   Sexual activity: Yes  Other Topics Concern   Not on file  Social History Narrative   3-4 caffeinated drinks per day   No regular exercise   Social Determinants of Health   Financial Resource Strain: Not on file  Food Insecurity: Not on file  Transportation Needs: Not on file  Physical Activity: Not on file  Stress: Not on file  Social Connections: Not on file    Family History  Problem Relation Age of Onset   Other Mother        MS   Hypertension Father    Hypothyroidism Sister    Hypertension Sister    Diabetes Other  Health Maintenance  Topic Date Due   Zoster Vaccines- Shingrix (1 of 2) Never done   COLONOSCOPY (Pts 45-56yrs Insurance coverage will need to be confirmed)  Never done   MAMMOGRAM  03/11/2014   PAP SMEAR-Modifier  08/05/2014   COVID-19 Vaccine (4 - Booster for Moderna series) 02/21/2021   INFLUENZA VACCINE  07/23/2021   Pneumococcal Vaccine 42-32 Years old (2 - PCV) 01/26/2022   TETANUS/TDAP  08/20/2023   Hepatitis C Screening  Completed   HIV Screening  Completed   HPV VACCINES  Aged Out     ----------------------------------------------------------------------------------------------------------------------------------------------------------------------------------------------------------------- Physical Exam BP 133/76 (BP Location: Left Arm, Patient Position: Sitting, Cuff Size: Large)   Pulse 66   Ht 5\' 4"  (1.626 m)   Wt 247 lb (112 kg)   LMP 09/27/2011   SpO2 96%   BMI 42.40 kg/m   Physical Exam Exam  conducted with a chaperone present 11/27/2011, CMA).  Constitutional:      Appearance: Normal appearance.  Genitourinary:    General: Normal vulva.     Cervix: Discharge (white) present. No cervical motion tenderness, friability or erythema.     Uterus: Normal.      Adnexa: Right adnexa normal and left adnexa normal.     Comments: Mild cystocele Neurological:     Mental Status: She is alert.    ------------------------------------------------------------------------------------------------------------------------------------------------------------------------------------------------------------------- Assessment and Plan  Vaginal discharge She continues to have vaginal discharge with irritation.  Sending repeat testing for gonorrhea/chlamydia, trichomonas as well as sure swab for bacterial vaginosis and yeast.  Pap was also completed today.   No orders of the defined types were placed in this encounter.   No follow-ups on file.    This visit occurred during the SARS-CoV-2 public health emergency.  Safety protocols were in place, including screening questions prior to the visit, additional usage of staff PPE, and extensive cleaning of exam room while observing appropriate contact time as indicated for disinfecting solutions.

## 2021-06-29 NOTE — Assessment & Plan Note (Signed)
She continues to have vaginal discharge with irritation.  Sending repeat testing for gonorrhea/chlamydia, trichomonas as well as sure swab for bacterial vaginosis and yeast.  Pap was also completed today.

## 2021-06-29 NOTE — Patient Instructions (Signed)
We'll be in touch with lab results and recommendations once these return.

## 2021-07-02 ENCOUNTER — Telehealth: Payer: Self-pay

## 2021-07-02 NOTE — Telephone Encounter (Signed)
Pt called concerning lab results.   Contacted [facility] concerning pending results.  Per [facility], lab was outsourced to [place]. Arrived on 7/10.  Contact pt and advised results were pending. She will be contacted by phone when results are available.

## 2021-07-04 LAB — SURESWAB BACTERIAL VAGINOSIS/ITIS
Atopobium vaginae: NOT DETECTED Log (cells/mL)
C. albicans, DNA: NOT DETECTED
C. glabrata, DNA: NOT DETECTED
C. parapsilosis, DNA: NOT DETECTED
C. tropicalis, DNA: NOT DETECTED
Gardnerella vaginalis: <4.7 Log (cells/mL)
LACTOBACILLUS SPECIES: 5.3 Log (cells/mL)
MEGASPHAERA SPECIES: NOT DETECTED Log (cells/mL)
Trichomonas vaginalis RNA: NOT DETECTED

## 2021-07-06 ENCOUNTER — Telehealth: Payer: Self-pay

## 2021-07-06 LAB — CYTOLOGY - PAP
Adequacy: ABSENT
Chlamydia: NEGATIVE
Comment: NEGATIVE
Comment: NEGATIVE
Comment: NEGATIVE
Comment: NEGATIVE
Comment: NORMAL
Diagnosis: UNDETERMINED — AB
HSV1: NEGATIVE
HSV2: NEGATIVE
High risk HPV: NEGATIVE
Neisseria Gonorrhea: NEGATIVE
Trichomonas: NEGATIVE

## 2021-07-06 NOTE — Telephone Encounter (Signed)
Contacted [facility]:  Test results show negative for everything except HSV. Waiting for HSV results & interpretation. No ETA given.

## 2021-07-10 ENCOUNTER — Encounter: Payer: Self-pay | Admitting: Family Medicine

## 2021-07-10 ENCOUNTER — Telehealth: Payer: Self-pay

## 2021-07-10 NOTE — Telephone Encounter (Signed)
Patient calling for lab results and medications.   Please advise.

## 2021-07-10 NOTE — Telephone Encounter (Signed)
She has some atypical cells on pap but HPV is negative.  She will need to repeat screening in 3 years.  All of her testing for STD's, yeast and bacterial vaginosis is negative.  No evidence of any abnormality on these tests.  This is likely just a physiologic discharge.  I would be happy to refer her to GYN if she continues to have concerns.    CM

## 2021-07-12 NOTE — Telephone Encounter (Signed)
Patient has been notified of results. Went directly to OB/Gyn to have her scheduled. They have contacted the patient.

## 2021-07-23 ENCOUNTER — Other Ambulatory Visit: Payer: Self-pay

## 2021-07-23 ENCOUNTER — Other Ambulatory Visit (HOSPITAL_COMMUNITY)
Admission: RE | Admit: 2021-07-23 | Discharge: 2021-07-23 | Disposition: A | Discharge status: Home / Self Care | Payer: Medicare Other | Source: Ambulatory Visit | Attending: Obstetrics & Gynecology | Admitting: Obstetrics & Gynecology

## 2021-07-23 ENCOUNTER — Encounter: Payer: Self-pay | Admitting: Obstetrics & Gynecology

## 2021-07-23 ENCOUNTER — Ambulatory Visit (INDEPENDENT_AMBULATORY_CARE_PROVIDER_SITE_OTHER): Payer: Medicare Other | Admitting: Obstetrics & Gynecology

## 2021-07-23 VITALS — BP 170/74 | HR 61 | Ht 64.0 in | Wt 247.0 lb

## 2021-07-23 DIAGNOSIS — N9489 Other specified conditions associated with female genital organs and menstrual cycle: Secondary | ICD-10-CM

## 2021-07-23 DIAGNOSIS — N949 Unspecified condition associated with female genital organs and menstrual cycle: Secondary | ICD-10-CM | POA: Insufficient documentation

## 2021-07-23 LAB — POCT URINALYSIS DIPSTICK
Bilirubin, UA: NEGATIVE
Glucose, UA: NEGATIVE
Ketones, UA: NEGATIVE
Nitrite, UA: NEGATIVE
Protein, UA: NEGATIVE
Spec Grav, UA: 1.02 (ref 1.010–1.025)
Urobilinogen, UA: 0.2 E.U./dL
pH, UA: 5 (ref 5.0–8.0)

## 2021-07-23 NOTE — Patient Instructions (Signed)
Try Replens for one month.  If not better, send [place] a message through my chart and we will prescribe vaginal estrogen.

## 2021-07-23 NOTE — Progress Notes (Signed)
   Subjective:    Patient ID: Alice Stewart, female    DOB: Dec 05, 1956, 65 y.o.   MRN: [id number]  HPI  65 yo female presents for f/u of vaginal burning.  Pt was treated for trich and chlamydia.  TOC negative.  She is still having burning.  She is menopausal and has not had HRT.  She has not used Replens in the past.  She does use [place] at time of intercourse.   Review of Systems  Constitutional: Negative.   Respiratory: Negative.    Cardiovascular: Negative.   Gastrointestinal: Negative.   Genitourinary:  Positive for vaginal pain.      Objective:   Physical Exam Vitals reviewed.  Constitutional:      General: She is not in acute distress.    Appearance: She is well-developed.  HENT:     Head: Normocephalic and atraumatic.  Eyes:     Conjunctiva/sclera: Conjunctivae normal.  Cardiovascular:     Rate and Rhythm: Normal rate.  Pulmonary:     Effort: Pulmonary effort is normal.  Genitourinary:    Comments: Tanner V Vulva:  No lesion Vagina:  Pink, no lesions, no discharge, no blood; no significant atrophy Cervix:  No CMT Uterus:  Non tender, mobile, limited by habitus Right adnexa--non tender, no mass, limited by habitus Left adnexa--non tender, no mass, lmited by habitus   Skin:    General: Skin is warm and dry.  Neurological:     Mental Status: She is alert and oriented to person, place, and time.  Psychiatric:        Mood and Affect: Mood normal.      Assessment & Plan:  65 yo female with vaginal burning.   RPT aptima Replens for 1 month; if burning not better, will try vaginal estrogen.   30 minutes spent on visit--review of records, exam and interview, counseling of vaginal dryness and vulvar health.

## 2021-07-24 LAB — CERVICOVAGINAL ANCILLARY ONLY
Bacterial Vaginitis (gardnerella): NEGATIVE
Candida Glabrata: NEGATIVE
Candida Vaginitis: NEGATIVE
Comment: NEGATIVE
Comment: NEGATIVE
Comment: NEGATIVE

## 2021-07-24 NOTE — Telephone Encounter (Signed)
Patient was referred to OB/GYN. Personally contacted patient and setup callback from OB/GYN

## 2021-07-25 LAB — URINE CULTURE
MICRO NUMBER:: 12188392
SPECIMEN QUALITY:: ADEQUATE

## 2021-07-26 ENCOUNTER — Ambulatory Visit (INDEPENDENT_AMBULATORY_CARE_PROVIDER_SITE_OTHER): Payer: Medicare Other

## 2021-07-26 ENCOUNTER — Other Ambulatory Visit: Payer: Self-pay

## 2021-07-26 ENCOUNTER — Encounter: Payer: Self-pay | Admitting: Family Medicine

## 2021-07-26 ENCOUNTER — Ambulatory Visit (INDEPENDENT_AMBULATORY_CARE_PROVIDER_SITE_OTHER): Payer: Medicare Other | Admitting: Family Medicine

## 2021-07-26 VITALS — BP 150/80 | HR 62 | Ht 64.0 in | Wt 248.0 lb

## 2021-07-26 DIAGNOSIS — M5412 Radiculopathy, cervical region: Secondary | ICD-10-CM

## 2021-07-26 DIAGNOSIS — M19072 Primary osteoarthritis, left ankle and foot: Secondary | ICD-10-CM | POA: Diagnosis not present

## 2021-07-26 DIAGNOSIS — M79672 Pain in left foot: Secondary | ICD-10-CM

## 2021-07-26 DIAGNOSIS — M542 Cervicalgia: Secondary | ICD-10-CM | POA: Diagnosis not present

## 2021-07-26 MED ORDER — CYCLOBENZAPRINE HCL 10 MG PO TABS: 10.0000 mg | ORAL_TABLET | Freq: Three times a day (TID) | ORAL | 0 refills | Status: DC | PRN

## 2021-07-26 MED ORDER — PREDNISONE 50 MG PO TABS: ORAL_TABLET | ORAL | 0 refills | Status: DC

## 2021-07-26 NOTE — Patient Instructions (Signed)

## 2021-07-29 DIAGNOSIS — M5412 Radiculopathy, cervical region: Secondary | ICD-10-CM | POA: Insufficient documentation

## 2021-07-29 DIAGNOSIS — M79672 Pain in left foot: Secondary | ICD-10-CM | POA: Insufficient documentation

## 2021-07-29 NOTE — Assessment & Plan Note (Signed)
She has nodular area along the left forefoot.  X-rays of left foot ordered.

## 2021-07-29 NOTE — Progress Notes (Signed)
Alice Stewart - 65 y.o. female MRN [id number]  Date of birth: 12-28-1955  Subjective Chief Complaint  Patient presents with   Arm Pain   Foot Pain    HPI Alice Stewart is a 65 year old female here today with complaint of neck and shoulder pain as well as well as foot pain.  She is having pain in her neck with radiation into her shoulder, shoulder blade area and arm.  She has some mild weakness with abduction.  She denies numbness or tingling associated with this.  She has not tried anything for treatment of this so far.  She does not recall any known injury or overuse.  She has never had this issue before.  She also has noted some pain and nodule along the bottom of the left foot.  She does not recall any injury to this area.  She denies any numbness or tingling.  ROS:  A comprehensive ROS was completed and negative except as noted per HPI   Allergies  Allergen Reactions   Bystolic [Nebivolol Hcl] Other (See Comments)    Nausea.    Amlodipine-Atorvastatin Other (See Comments)    salty taste in mouth, stomach burning, H/A   Chlorthalidone Nausea Only    Headache   Diltiazem Hcl Other (See Comments)    Dizzy   Lisinopril     backache   Metoprolol Tartrate Other (See Comments)    nausea,HA, dizziness   Simvastatin Other (See Comments)    Myalgia     Past Medical History:  Diagnosis Date   Anemia    Back pain    Benign positional vertigo    Carpal tunnel syndrome    Goiter    nontoxid mutinodular   Hypertension    Hyperthyroidism    Insulin resistance syndrome    Menopausal disorder    Menorrhagia    Migraine    Muscle spasm    trapezius   Neck pain, acute    Obesity    Seborrheic dermatitis    Thyroid disorder     Past Surgical History:  Procedure Laterality Date   DILATION AND CURETTAGE OF UTERUS  04/09   ENDOMETRIAL ABLATION  04/09   GREAT TOE ARTHRODESIS, METATARSALPHALANGEAL JOINT  6/07   Rt foot   HEEL SPUR SURGERY  11/87   Lt foot   TUBAL LIGATION   11/90    Social History   Socioeconomic History   Marital status: Single    Spouse name: Not on file   Number of children: 3   Years of education: 14   Highest education level: Not on file  Occupational History   Occupation: unemployed    Employer: [employer]  Tobacco Use   Smoking status: Former    Types: Cigarettes    Quit date: 09/22/2004    Years since quitting: 16.8   Smokeless tobacco: Never  Vaping Use   Vaping Use: Never used  Substance and Sexual Activity   Alcohol use: Yes    Alcohol/week: 1.0 standard drink    Types: 1 Standard drinks or equivalent per week    Comment: 1 weekly   Drug use: No   Sexual activity: Yes  Other Topics Concern   Not on file  Social History Narrative   3-4 caffeinated drinks per day   No regular exercise   Social Determinants of Health   Financial Resource Strain: Not on file  Food Insecurity: Not on file  Transportation Needs: Not on file  Physical Activity: Not on file  Stress:  Not on file  Social Connections: Not on file    Family History  Problem Relation Age of Onset   Other Mother        MS   Hypertension Father    Hypothyroidism Sister    Hypertension Sister    Diabetes Other     Health Maintenance  Topic Date Due   Zoster Vaccines- Shingrix (1 of 2) Never done   COLONOSCOPY (Pts 45-5yrs Insurance coverage will need to be confirmed)  Never done   COVID-19 Vaccine (4 - Booster for Moderna series) 02/21/2021   MAMMOGRAM  07/27/2021   INFLUENZA VACCINE  07/23/2021   Pneumococcal Vaccine 4-52 Years old (2 - PCV) 01/26/2022   TETANUS/TDAP  08/20/2023   PAP SMEAR-Modifier  06/29/2024   Hepatitis C Screening  Completed   HIV Screening  Completed   HPV VACCINES  Aged Out     ----------------------------------------------------------------------------------------------------------------------------------------------------------------------------------------------------------------- Physical Exam BP (!) 150/80 (BP  Location: Left Arm, Patient Position: Sitting, Cuff Size: Large)   Pulse 62   Ht 5\' 4"  (1.626 m)   Wt 248 lb (112.5 kg)   LMP 09/27/2011   SpO2 96%   BMI 42.57 kg/m   Physical Exam Constitutional:      Appearance: Normal appearance.  HENT:     Head: Normocephalic and atraumatic.  Eyes:     General: No scleral icterus. Cardiovascular:     Rate and Rhythm: Normal rate and regular rhythm.  Pulmonary:     Effort: Pulmonary effort is normal.     Breath sounds: Normal breath sounds.  Musculoskeletal:     Cervical back: Neck supple.     Comments: Range of motion of neck is normal.  Range of motion of shoulder is normal.  Strength is normal.  Tenderness to palpation along the left forefoot and plantar fascia.  Small nodule along the forefoot.  Skin:    General: Skin is warm and dry.  Neurological:     General: No focal deficit present.     Mental Status: She is alert.  Psychiatric:        Mood and Affect: Mood normal.        Behavior: Behavior normal.    ------------------------------------------------------------------------------------------------------------------------------------------------------------------------------------------------------------------- Assessment and Plan  Cervical radiculopathy Her symptoms are consistent with cervical radiculopathy.  She did have some spasm of the upper trapezius.  Starting course of prednisone as well as Flexeril.  Recommend gentle stretching.  We can add on some formal physical therapy as well if not improving with this.  X-rays of cervical spine ordered.  Left foot pain She has nodular area along the left forefoot.  X-rays of left foot ordered.   Meds ordered this encounter  Medications   predniSONE (DELTASONE) 50 MG tablet    Sig: Take 1 tab po daily x5 days    Dispense:  5 tablet    Refill:  0   cyclobenzaprine (FLEXERIL) 10 MG tablet    Sig: Take 1 tablet (10 mg total) by mouth 3 (three) times daily as needed for muscle  spasms.    Dispense:  30 tablet    Refill:  0    No follow-ups on file.    This visit occurred during the SARS-CoV-2 public health emergency.  Safety protocols were in place, including screening questions prior to the visit, additional usage of staff PPE, and extensive cleaning of exam room while observing appropriate contact time as indicated for disinfecting solutions.

## 2021-07-29 NOTE — Assessment & Plan Note (Signed)
Her symptoms are consistent with cervical radiculopathy.  She did have some spasm of the upper trapezius.  Starting course of prednisone as well as Flexeril.  Recommend gentle stretching.  We can add on some formal physical therapy as well if not improving with this.  X-rays of cervical spine ordered.

## 2021-08-08 DIAGNOSIS — Z20822 Contact with and (suspected) exposure to covid-19: Secondary | ICD-10-CM | POA: Diagnosis not present

## 2021-08-08 DIAGNOSIS — N132 Hydronephrosis with renal and ureteral calculous obstruction: Secondary | ICD-10-CM | POA: Diagnosis not present

## 2021-08-08 DIAGNOSIS — I110 Hypertensive heart disease with heart failure: Secondary | ICD-10-CM | POA: Diagnosis not present

## 2021-08-08 DIAGNOSIS — Z743 Need for continuous supervision: Secondary | ICD-10-CM | POA: Diagnosis not present

## 2021-08-08 DIAGNOSIS — R109 Unspecified abdominal pain: Secondary | ICD-10-CM | POA: Diagnosis not present

## 2021-08-08 DIAGNOSIS — R1084 Generalized abdominal pain: Secondary | ICD-10-CM | POA: Insufficient documentation

## 2021-08-08 DIAGNOSIS — R0902 Hypoxemia: Secondary | ICD-10-CM | POA: Diagnosis not present

## 2021-08-08 DIAGNOSIS — I491 Atrial premature depolarization: Secondary | ICD-10-CM | POA: Diagnosis not present

## 2021-08-08 DIAGNOSIS — N200 Calculus of kidney: Secondary | ICD-10-CM | POA: Diagnosis not present

## 2021-08-08 DIAGNOSIS — Z950 Presence of cardiac pacemaker: Secondary | ICD-10-CM | POA: Diagnosis not present

## 2021-08-08 DIAGNOSIS — I499 Cardiac arrhythmia, unspecified: Secondary | ICD-10-CM | POA: Diagnosis not present

## 2021-08-08 DIAGNOSIS — N201 Calculus of ureter: Secondary | ICD-10-CM | POA: Insufficient documentation

## 2021-08-08 DIAGNOSIS — I428 Other cardiomyopathies: Secondary | ICD-10-CM | POA: Diagnosis not present

## 2021-08-08 DIAGNOSIS — N3000 Acute cystitis without hematuria: Secondary | ICD-10-CM | POA: Diagnosis not present

## 2021-08-08 DIAGNOSIS — R6889 Other general symptoms and signs: Secondary | ICD-10-CM | POA: Diagnosis not present

## 2021-08-08 DIAGNOSIS — I5022 Chronic systolic (congestive) heart failure: Secondary | ICD-10-CM | POA: Diagnosis not present

## 2021-08-08 DIAGNOSIS — Z87891 Personal history of nicotine dependence: Secondary | ICD-10-CM | POA: Diagnosis not present

## 2021-08-08 DIAGNOSIS — N136 Pyonephrosis: Secondary | ICD-10-CM | POA: Diagnosis not present

## 2021-08-08 DIAGNOSIS — N202 Calculus of kidney with calculus of ureter: Secondary | ICD-10-CM | POA: Diagnosis not present

## 2021-08-09 NOTE — Progress Notes (Signed)
Referring-Cody Ashley Royalty, DO Reason for referral-cardiomyopathy  HPI: 65 year old female for evaluation of cardiomyopathy at request of Milford Cage, DO.  Previously followed by Mayra Reel, MD in [place].  She has a history of echocardiogram in 2014 showed ejection fraction 25 to 30% with left bundle branch block.  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  She had CRT-D in 2015.  CTA in July 2021 showed minimal nonobstructive coronary disease with calcium score 34.  Echocardiogram repeated May 2021 showed ejection fraction 55 to 60%.  Her PVCs are now treated with flecainide.  Based on last office visit note from March 2022 Holter monitor and follow-up echocardiogram recommended at time of evaluation in [place].  Patient recently moved back to this area from [place] state.  She has minimal dyspnea on exertion but denies orthopnea, PND, pedal edema, exertional chest pain, syncope.  Occasional palpitations.  Cardiology now asked to evaluate.  Current Outpatient Medications  Medication Sig Dispense Refill   Ascorbic Acid (VITAMIN C) 1000 MG tablet Take 1,000 mg by mouth daily.       aspirin 81 MG EC tablet Take by mouth.     Calcium 1500 MG tablet Take 1,500 mg by mouth.       flecainide (TAMBOCOR) 100 MG tablet Take 100 mg by mouth 2 (two) times daily.     furosemide (LASIX) 20 MG tablet Take 20 mg by mouth.     hydrochlorothiazide (HYDRODIURIL) 25 MG tablet Take 1 tablet by mouth daily.     losartan (COZAAR) 100 MG tablet Take 1 tablet by mouth daily.     Multiple Vitamin (MULTIVITAMIN) tablet Take 1 tablet by mouth daily.       potassium chloride SA (KLOR-CON) 20 MEQ tablet Take 40 mEq by mouth daily.     Red Clover Leaf Extract 500 MG TABS Take 1 tablet by mouth daily.       sulfamethoxazole-trimethoprim (BACTRIM DS) 800-160 MG tablet Take 1 tablet by mouth 2 (two) times daily.     tamsulosin (FLOMAX) 0.4 MG CAPS capsule Take by mouth.      VITAMIN D, ERGOCALCIFEROL, PO Take 1,000 mg by mouth daily.      No current facility-administered medications for this visit.    Allergies  Allergen Reactions   Bystolic [Nebivolol Hcl] Other (See Comments)    Nausea.    Amlodipine-Atorvastatin Other (See Comments)    salty taste in mouth, stomach burning, H/A   Chlorthalidone Nausea Only    Headache   Diltiazem Hcl Other (See Comments)    Dizzy   Lisinopril     backache   Metoprolol Tartrate Other (See Comments)    nausea,HA, dizziness   Simvastatin Other (See Comments)    Myalgia      Past Medical History:  Diagnosis Date   Anemia    Back pain    Benign positional vertigo    Cardiomyopathy (HCC)    Carpal tunnel syndrome    Goiter    nontoxid mutinodular   Hypertension    Hyperthyroidism    Insulin resistance syndrome    Menopausal disorder    Menorrhagia    Migraine    Muscle spasm    trapezius   Neck pain, acute    Obesity    Seborrheic dermatitis    Thyroid disorder     Past Surgical History:  Procedure Laterality Date   DILATION AND CURETTAGE OF UTERUS  04/09   ENDOMETRIAL ABLATION  04/09   GREAT TOE ARTHRODESIS, METATARSALPHALANGEAL JOINT  6/07   Rt foot   HEEL SPUR SURGERY  11/87   Lt foot   TUBAL LIGATION  11/90    Social History   Socioeconomic History   Marital status: Divorced    Spouse name: Not on file   Number of children: 3   Years of education: 14   Highest education level: Not on file  Occupational History   Occupation: unemployed    Employer: [employer]  Tobacco Use   Smoking status: Former    Types: Cigarettes    Quit date: 09/22/2004    Years since quitting: 16.9   Smokeless tobacco: Never  Vaping Use   Vaping Use: Never used  Substance and Sexual Activity   Alcohol use: Yes    Alcohol/week: 1.0 standard drink    Types: 1 Standard drinks or equivalent per week    Comment: 1 weekly   Drug use: No   Sexual activity: Yes  Other Topics Concern   Not on file  Social  History Narrative   3-4 caffeinated drinks per day   No regular exercise   Social Determinants of Health   Financial Resource Strain: Not on file  Food Insecurity: Not on file  Transportation Needs: Not on file  Physical Activity: Not on file  Stress: Not on file  Social Connections: Not on file  Intimate Partner Violence: Not on file    Family History  Problem Relation Age of Onset   Other Mother        MS   Hypertension Father    Hypothyroidism Sister    Hypertension Sister    Diabetes Other     ROS: no fevers or chills, productive cough, hemoptysis, dysphasia, odynophagia, melena, hematochezia, dysuria, hematuria, rash, seizure activity, orthopnea, PND, pedal edema, claudication. Remaining systems are negative.  Physical Exam:   Blood pressure 140/68, pulse 72, height 5\' 3"  (1.6 m), weight 248 lb 0.6 oz (112.5 kg), last menstrual period 09/27/2011.  General:  Well developed/well nourished in NAD Skin warm/dry Patient not depressed No peripheral clubbing Back-normal HEENT-normal/normal eyelids Neck supple/normal carotid upstroke bilaterally; no bruits; no JVD; no thyromegaly chest - CTA/ normal expansion CV - RRR/normal S1 and S2; no rubs or gallops;  PMI nondisplaced; 2/6 systolic murmur left sternal border. Abdomen -NT/ND, no HSM, no mass, + bowel sounds, no bruit 2+ femoral pulses, no bruits Ext-no edema, chords, 2+ DP Neuro-grossly nonfocal  ECG -sinus with ventricular pacing.  Personally reviewed  A/P  1 history of nonischemic cardiomyopathy-LV function has improved on most recent echocardiogram.  Will repeat study. We will continue ARB.  Patient apparently has had side effects with beta-blockade.  2 prior CRT-D-we will arrange follow-up with Dr. 11/27/2011 in [facility].  3 hypertension-BP controlled; continue present meds.  4 history of PVCs-continue flecainide.  5 history of mildly elevated calcium score-she is on aspirin.  She apparently has had  myalgias with statins.  We will check lipids.  If LDL greater than 70 will consider Zetia or Repatha.  [facility], MD

## 2021-08-13 ENCOUNTER — Telehealth: Payer: Self-pay | Admitting: *Deleted

## 2021-08-13 NOTE — Telephone Encounter (Signed)
Transition Care Management Unsuccessful Follow-up Telephone Call  Date of discharge and from where:  08/12/2021 - [facility]  Attempts:  1st Attempt  Reason for unsuccessful TCM follow-up call:  Left voice message

## 2021-08-14 NOTE — Telephone Encounter (Signed)
Transition Care Management Unsuccessful Follow-up Telephone Call  Date of discharge and from where:  08/12/2021 - [facility]  Attempts:  2nd Attempt  Reason for unsuccessful TCM follow-up call:  Left voice message

## 2021-08-15 NOTE — Telephone Encounter (Signed)
Transition Care Management Unsuccessful Follow-up Telephone Call  Date of discharge and from where:  08/12/2021 - [facility]  Attempts:  3rd Attempt  Reason for unsuccessful TCM follow-up call:  Voice mail full

## 2021-08-20 ENCOUNTER — Ambulatory Visit (INDEPENDENT_AMBULATORY_CARE_PROVIDER_SITE_OTHER): Payer: Medicare Other | Admitting: Cardiology

## 2021-08-20 ENCOUNTER — Other Ambulatory Visit: Payer: Self-pay

## 2021-08-20 ENCOUNTER — Encounter: Payer: Self-pay | Admitting: Cardiology

## 2021-08-20 VITALS — BP 140/68 | HR 72 | Ht 63.0 in | Wt 248.0 lb

## 2021-08-20 DIAGNOSIS — E7800 Pure hypercholesterolemia, unspecified: Secondary | ICD-10-CM | POA: Diagnosis not present

## 2021-08-20 DIAGNOSIS — I429 Cardiomyopathy, unspecified: Secondary | ICD-10-CM

## 2021-08-20 DIAGNOSIS — I10 Essential (primary) hypertension: Secondary | ICD-10-CM

## 2021-08-20 DIAGNOSIS — I2510 Atherosclerotic heart disease of native coronary artery without angina pectoris: Secondary | ICD-10-CM

## 2021-08-20 NOTE — Patient Instructions (Signed)
  Lab Work:  Your physician recommends that you return for lab work FASTING  If you have labs (blood work) drawn today and your tests are completely normal, you will receive your results only by: MyChart Message (if you have MyChart) OR A paper copy in the mail If you have any lab test that is abnormal or we need to change your treatment, we will call you to review the results.   Testing/Procedures:  Your physician has requested that you have an echocardiogram. Echocardiography is a painless test that uses sound waves to create images of your heart. It provides your doctor with information about the size and shape of your heart and how well your heart's chambers and valves are working. This procedure takes approximately one hour. There are no restrictions for this procedure. [address] IMAGING DEPARTMENT   Follow-Up: At [facility], you and your health needs are our priority.  As part of our continuing mission to provide you with exceptional heart care, we have created designated Provider Care Teams.  These Care Teams include your primary Cardiologist (physician) and Advanced Practice Providers (APPs -  Physician Assistants and Nurse Practitioners) who all work together to provide you with the care you need, when you need it.  We recommend signing up for the patient portal called "MyChart".  Sign up information is provided on this After Visit Summary.  MyChart is used to connect with patients for Virtual Visits (Telemedicine).  Patients are able to view lab/test results, encounter notes, upcoming appointments, etc.  Non-urgent messages can be sent to your provider as well.   To learn more about what you can do with MyChart, go to [email or web address].    Your next appointment:   6 month(s)  The format for your next appointment:   In Person  Provider:   Olga Millers, MD

## 2021-08-23 NOTE — Addendum Note (Signed)
Addended by: Dorris Fetch on: 08/23/2021 02:27 PM   Modules accepted: Orders

## 2021-08-25 ENCOUNTER — Other Ambulatory Visit: Payer: Self-pay

## 2021-08-25 ENCOUNTER — Ambulatory Visit (INDEPENDENT_AMBULATORY_CARE_PROVIDER_SITE_OTHER): Payer: Medicare Other

## 2021-08-25 DIAGNOSIS — Z0000 Encounter for general adult medical examination without abnormal findings: Secondary | ICD-10-CM

## 2021-08-25 DIAGNOSIS — Z1231 Encounter for screening mammogram for malignant neoplasm of breast: Secondary | ICD-10-CM | POA: Diagnosis not present

## 2021-08-25 NOTE — Progress Notes (Addendum)
Virtual Visit via Telephone Note  I connected with  Alice Stewart on 08/25/21 at 11:20 AM EDT by telephone and verified that I am speaking with the correct person using two identifiers.  Medicare Annual Wellness visit completed telephonically due to Covid-19 pandemic.   Persons participating in this call: This Health Coach and this patient.   Location: Patient: Home Provider: Office   I discussed the limitations, risks, security and privacy concerns of performing an evaluation and management service by telephone and the availability of in person appointments. The patient expressed understanding and agreed to proceed.  Unable to perform video visit due to video visit attempted and failed and/or patient does not have video capability.   Some vital signs may be absent or patient reported.   Marzella Schlein, LPN   Subjective:   Alice Stewart is a 65 y.o. female who presents for an Initial Medicare Annual Wellness Visit.  Review of Systems     Cardiac Risk Factors include: hypertension;dyslipidemia;obesity (BMI >30kg/m2)     Objective:    Today's Vitals   08/25/21 1114  PainSc: 5    There is no height or weight on file to calculate BMI.  Advanced Directives 08/25/2021 06/13/2021  Does Patient Have a Medical Advance Directive? Yes Yes  Type of Advance Directive Healthcare Power of Attorney Living will  Copy of Healthcare Power of Attorney in Chart? No - copy requested -    Current Medications (verified) Outpatient Encounter Medications as of 08/25/2021  Medication Sig   Ascorbic Acid (VITAMIN C) 1000 MG tablet Take 1,000 mg by mouth daily.     aspirin 81 MG EC tablet Take by mouth.   Calcium 1500 MG tablet Take 1,500 mg by mouth.     flecainide (TAMBOCOR) 100 MG tablet Take 100 mg by mouth 2 (two) times daily.   furosemide (LASIX) 20 MG tablet Take 20 mg by mouth.   hydrochlorothiazide (HYDRODIURIL) 25 MG tablet Take 1 tablet by mouth daily.   losartan (COZAAR) 100 MG  tablet Take 1 tablet by mouth daily.   Multiple Vitamin (MULTIVITAMIN) tablet Take 1 tablet by mouth daily.     potassium chloride SA (KLOR-CON) 20 MEQ tablet Take 40 mEq by mouth daily.   Red Clover Leaf Extract 500 MG TABS Take 1 tablet by mouth daily.     VITAMIN D, ERGOCALCIFEROL, PO Take 1,000 mg by mouth daily.    sulfamethoxazole-trimethoprim (BACTRIM DS) 800-160 MG tablet Take 1 tablet by mouth 2 (two) times daily. (Patient not taking: Reported on 08/25/2021)   tamsulosin (FLOMAX) 0.4 MG CAPS capsule Take by mouth. (Patient not taking: Reported on 08/25/2021)   No facility-administered encounter medications on file as of 08/25/2021.    Allergies (verified) Bystolic [nebivolol hcl], Amlodipine-atorvastatin, Chlorthalidone, Diltiazem hcl, Lisinopril, Metoprolol tartrate, and Simvastatin   History: Past Medical History:  Diagnosis Date   Anemia    Back pain    Benign positional vertigo    Cardiomyopathy (HCC)    Carpal tunnel syndrome    Goiter    nontoxid mutinodular   Hypertension    Hyperthyroidism    Insulin resistance syndrome    Menopausal disorder    Menorrhagia    Migraine    Muscle spasm    trapezius   Neck pain, acute    Obesity    Seborrheic dermatitis    Thyroid disorder    Past Surgical History:  Procedure Laterality Date   DILATION AND CURETTAGE OF UTERUS  04/09  ENDOMETRIAL ABLATION  04/09   GREAT TOE ARTHRODESIS, METATARSALPHALANGEAL JOINT  6/07   Rt foot   HEEL SPUR SURGERY  11/87   Lt foot   TUBAL LIGATION  11/90   Family History  Problem Relation Age of Onset   Other Mother        MS   Hypertension Father    Hypothyroidism Sister    Hypertension Sister    Diabetes Other    Social History   Socioeconomic History   Marital status: Divorced    Spouse name: Not on file   Number of children: 3   Years of education: 14   Highest education level: Not on file  Occupational History   Occupation: unemployed    Employer: [email or web address]  Tobacco Use    Smoking status: Former    Types: Cigarettes    Quit date: 09/22/2004    Years since quitting: 16.9   Smokeless tobacco: Never  Vaping Use   Vaping Use: Never used  Substance and Sexual Activity   Alcohol use: Yes    Alcohol/week: 1.0 standard drink    Types: 1 Standard drinks or equivalent per week    Comment: 1 weekly   Drug use: No   Sexual activity: Yes  Other Topics Concern   Not on file  Social History Narrative   3-4 caffeinated drinks per day   No regular exercise   Social Determinants of Health   Financial Resource Strain: Low Risk    Difficulty of Paying Living Expenses: Not hard at all  Food Insecurity: No Food Insecurity   Worried About Programme researcher, broadcasting/film/video in the Last Year: Never true   Ran Out of Food in the Last Year: Never true  Transportation Needs: No Transportation Needs   Lack of Transportation (Medical): No   Lack of Transportation (Non-Medical): No  Physical Activity: Sufficiently Active   Days of Exercise per Week: 5 days   Minutes of Exercise per Session: 30 min  Stress: No Stress Concern Present   Feeling of Stress : Not at all  Social Connections: Moderately Isolated   Frequency of Communication with Friends and Family: More than three times a week   Frequency of Social Gatherings with Friends and Family: More than three times a week   Attends Religious Services: 1 to 4 times per year   Active Member of [employer] or Organizations: No   Attends Banker Meetings: Never   Marital Status: Divorced    Tobacco Counseling Counseling given: Not Answered   Clinical Intake:  Pre-visit preparation completed: Yes  Pain : 0-10 Pain Score: 5  Pain Type: Chronic pain Pain Location: Back (kidney) Pain Descriptors / Indicators: Other (Comment) (like a punch in the gut) Pain Onset: 1 to 4 weeks ago Pain Frequency: Intermittent     BMI - recorded: 43.95 Nutritional Status: BMI > 30  Obese Nutritional Risks: None Diabetes: No  How  often do you need to have someone help you when you read instructions, pamphlets, or other written materials from your doctor or pharmacy?: 1 - Never  Diabetic?No  Interpreter Needed?: No  Information entered by :: Lanier Ensign, LPN   Activities of Daily Living In your present state of health, do you have any difficulty performing the following activities: 08/25/2021  Hearing? N  Vision? N  Difficulty concentrating or making decisions? N  Walking or climbing stairs? N  Dressing or bathing? N  Doing errands, shopping? N  Preparing Food and eating ? N  Using the Toilet? N  In the past six months, have you accidently leaked urine? Y  Comment wears a bladder pad at this time  Do you have problems with loss of bowel control? N  Managing your Medications? N  Managing your Finances? N  Housekeeping or managing your Housekeeping? N  Some recent data might be hidden    Patient Care Team: Everrett Coombe, DO as PCP - General (Family Medicine) Himmelrich, Loree Fee, RD (Inactive) as Dietitian  Indicate any recent Medical Services you may have received from other than [facility] providers in the past year (date may be approximate).     Assessment:   This is a routine wellness examination for Lyrique.  Hearing/Vision screen Hearing Screening - Comments:: Pt denies any hearing issues  Vision Screening - Comments:: Pt  needs eye provider  Dietary issues and exercise activities discussed: Current Exercise Habits: Home exercise routine, Type of exercise: walking;Other - see comments, Time (Minutes): 30, Frequency (Times/Week): 5, Weekly Exercise (Minutes/Week): 150   Goals Addressed             This Visit's Progress    Patient Stated       Lose weight        Depression Screen PHQ 2/9 Scores 08/25/2021 06/13/2021  PHQ - 2 Score 0 0  PHQ- 9 Score - 3    Fall Risk Fall Risk  08/25/2021 06/13/2021  Falls in the past year? 0 0  Number falls in past yr: 0 0  Injury with Fall? 0 0  Risk  for fall due to : Impaired vision No Fall Risks  Follow up Falls prevention discussed Falls evaluation completed    FALL RISK PREVENTION PERTAINING TO THE HOME:  Any stairs in or around the home? Yes  If so, are there any without handrails? No  Home free of loose throw rugs in walkways, pet beds, electrical cords, etc? Yes  Adequate lighting in your home to reduce risk of falls? Yes   ASSISTIVE DEVICES UTILIZED TO PREVENT FALLS:  Life alert? No  but has a pulled cord for emergency Use of a cane, walker or w/c? No  Grab bars in the bathroom? Yes  Shower chair or bench in shower? No  Elevated toilet seat or a handicapped toilet? Yes   TIMED UP AND GO:  Was the test performed? No .   Cognitive Function:     6CIT Screen 08/25/2021  What Year? 0 points  What month? 0 points  What time? 0 points  Count back from 20 0 points  Months in reverse 0 points  Repeat phrase 0 points  Total Score 0    Immunizations Immunization History  Administered Date(s) Administered   Influenza Split 11/26/2011   Influenza Whole 09/15/2013, 09/19/2014, 10/20/2015   Influenza, Seasonal, Injecte, Preservative Fre 09/22/2020   Influenza,inj,Quad PF,6+ Mos 09/22/2018   Influenza,inj,quad, With Preservative 08/14/2016   Influenza-Unspecified 09/16/2013, 09/19/2014, 10/20/2015, 10/01/2019   Moderna Sars-Covid-2 Vaccination 02/25/2020, 03/24/2020, 11/23/2020   Pneumococcal Polysaccharide-23 01/26/2021   Tdap 08/19/2013    TDAP status: Up to date  Flu Vaccine status: Due, Education has been provided regarding the importance of this vaccine. Advised may receive this vaccine at local pharmacy or Health Dept. Aware to provide a copy of the vaccination record if obtained from local pharmacy or Health Dept. Verbalized acceptance and understanding.  Pneumococcal vaccine status: Due, Education has been provided regarding the importance of this vaccine. Advised may receive this vaccine at local pharmacy or  Health  Dept. Aware to provide a copy of the vaccination record if obtained from local pharmacy or Health Dept. Verbalized acceptance and understanding.  Covid-19 vaccine status: Completed vaccines  Qualifies for Shingles Vaccine? Yes   Zostavax completed No   Shingrix Completed?: No.    Education has been provided regarding the importance of this vaccine. Patient has been advised to call insurance company to determine out of pocket expense if they have not yet received this vaccine. Advised may also receive vaccine at local pharmacy or Health Dept. Verbalized acceptance and understanding.  Screening Tests Health Maintenance  Topic Date Due   Zoster Vaccines- Shingrix (1 of 2) Never done   COLONOSCOPY (Pts 45-61yrs Insurance coverage will need to be confirmed)  Never done   COVID-19 Vaccine (4 - Booster for Moderna series) 02/21/2021   INFLUENZA VACCINE  07/23/2021   MAMMOGRAM  07/27/2021   Pneumococcal Vaccine 61-11 Years old (2 - PCV) 01/26/2022   TETANUS/TDAP  08/20/2023   PAP SMEAR-Modifier  06/29/2024   Hepatitis C Screening  Completed   HIV Screening  Completed   HPV VACCINES  Aged Out    Health Maintenance  Health Maintenance Due  Topic Date Due   Zoster Vaccines- Shingrix (1 of 2) Never done   COLONOSCOPY (Pts 45-60yrs Insurance coverage will need to be confirmed)  Never done   COVID-19 Vaccine (4 - Booster for Moderna series) 02/21/2021   INFLUENZA VACCINE  07/23/2021   MAMMOGRAM  07/27/2021    Colorectal cancer screening: Type of screening: Colonoscopy. Completed 2014. Repeat every 10 years  Mammogram status: Ordered 08/25/21. Pt provided with contact info and advised to call to schedule appt.      Additional Screening:  Hepatitis C Screening:  Completed 05/28/21  Vision Screening: Recommended annual ophthalmology exams for early detection of glaucoma and other disorders of the eye. Is the patient up to date with their annual eye exam?  No  Who is the provider or  what is the name of the office in which the patient attends annual eye exams? Request a provider  If pt is not established with a provider, would they like to be referred to a provider to establish care? Yes .   Dental Screening: Recommended annual dental exams for proper oral hygiene  Community Resource Referral / Chronic Care Management: CRR required this visit?  No   CCM required this visit?  No      Plan:     I have personally reviewed and noted the following in the patient's chart:   Medical and social history Use of alcohol, tobacco or illicit drugs  Current medications and supplements including opioid prescriptions. Patient is not currently taking opioid prescriptions. Functional ability and status Nutritional status Physical activity Advanced directives List of other physicians Hospitalizations, surgeries, and ER visits in previous 12 months Vitals Screenings to include cognitive, depression, and falls Referrals and appointments  In addition, I have reviewed and discussed with patient certain preventive protocols, quality metrics, and best practice recommendations. A written personalized care plan for preventive services as well as general preventive health recommendations were provided to patient.     Marzella Schlein, LPN   04/23/8412   Nurse Notes: None

## 2021-08-25 NOTE — Patient Instructions (Addendum)
[name] , Thank you for taking time to come for your Medicare Wellness Visit. I appreciate your ongoing commitment to your health goals. Please review the following plan we discussed and let me know if I can assist you in the future.   Screening recommendations/referrals: Colonoscopy: Pt states completed 2014 repeat every 10 years  Mammogram: Done 07/27/20 repeat every year  Recommended yearly ophthalmology/optometry visit for glaucoma screening and checkup Recommended yearly dental visit for hygiene and checkup  Vaccinations: Influenza vaccine: Due Pneumococcal vaccine: Due Tdap vaccine: Done 08/19/13 repeat every 10 years  Shingles vaccine: Shingrix discussed. Please contact your pharmacy for coverage information.   Covid-19: Completed 3/5, 4/12, & 11/23/20  Advanced directives: Please bring a copy of your health care power of attorney and living will to the office at your convenience.  Conditions/risks identified: Lose weight  Next appointment: Follow up in one year for your annual wellness visit.   Preventive Care 40-64 Years, Female Preventive care refers to lifestyle choices and visits with your health care provider that can promote health and wellness. What does preventive care include? A yearly physical exam. This is also called an annual well check. Dental exams once or twice a year. Routine eye exams. Ask your health care provider how often you should have your eyes checked. Personal lifestyle choices, including: Daily care of your teeth and gums. Regular physical activity. Eating a healthy diet. Avoiding tobacco and drug use. Limiting alcohol use. Practicing safe sex. Taking low-dose aspirin daily starting at age 63. Taking vitamin and mineral supplements as recommended by your health care provider. What happens during an annual well check? The services and screenings done by your health care provider during your annual well check will depend on your age, overall  health, lifestyle risk factors, and family history of disease. Counseling  Your health care provider may ask you questions about your: Alcohol use. Tobacco use. Drug use. Emotional well-being. Home and relationship well-being. Sexual activity. Eating habits. Work and work Statistician. Method of birth control. Menstrual cycle. Pregnancy history. Screening  You may have the following tests or measurements: Height, weight, and BMI. Blood pressure. Lipid and cholesterol levels. These may be checked every 5 years, or more frequently if you are over 8 years old. Skin check. Lung cancer screening. You may have this screening every year starting at age 36 if you have a 30-pack-year history of smoking and currently smoke or have quit within the past 15 years. Fecal occult blood test (FOBT) of the stool. You may have this test every year starting at age 57. Flexible sigmoidoscopy or colonoscopy. You may have a sigmoidoscopy every 5 years or a colonoscopy every 10 years starting at age 86. Hepatitis C blood test. Hepatitis B blood test. Sexually transmitted disease (STD) testing. Diabetes screening. This is done by checking your blood sugar (glucose) after you have not eaten for a while (fasting). You may have this done every 1-3 years. Mammogram. This may be done every 1-2 years. Talk to your health care provider about when you should start having regular mammograms. This may depend on whether you have a family history of breast cancer. BRCA-related cancer screening. This may be done if you have a family history of breast, ovarian, tubal, or peritoneal cancers. Pelvic exam and Pap test. This may be done every 3 years starting at age 69. Starting at age 76, this may be done every 5 years if you have a Pap test in combination with an HPV test. Bone  density scan. This is done to screen for osteoporosis. You may have this scan if you are at high risk for osteoporosis. Discuss your test results,  treatment options, and if necessary, the need for more tests with your health care provider. Vaccines  Your health care provider may recommend certain vaccines, such as: Influenza vaccine. This is recommended every year. Tetanus, diphtheria, and acellular pertussis (Tdap, Td) vaccine. You may need a Td booster every 10 years. Zoster vaccine. You may need this after age 69. Pneumococcal 13-valent conjugate (PCV13) vaccine. You may need this if you have certain conditions and were not previously vaccinated. Pneumococcal polysaccharide (PPSV23) vaccine. You may need one or two doses if you smoke cigarettes or if you have certain conditions. Talk to your health care provider about which screenings and vaccines you need and how often you need them. This information is not intended to replace advice given to you by your health care provider. Make sure you discuss any questions you have with your health care provider. Document Released: 01/05/2016 Document Revised: 08/28/2016 Document Reviewed: 10/10/2015 Elsevier Interactive Patient Education  2017 [place] Prevention in the Home Falls can cause injuries. They can happen to people of all ages. There are many things you can do to make your home safe and to help prevent falls. What can I do on the outside of my home? Regularly fix the edges of walkways and driveways and fix any cracks. Remove anything that might make you trip as you walk through a door, such as a raised step or threshold. Trim any bushes or trees on the path to your home. Use bright outdoor lighting. Clear any walking paths of anything that might make someone trip, such as rocks or tools. Regularly check to see if handrails are loose or broken. Make sure that both sides of any steps have handrails. Any raised decks and porches should have guardrails on the edges. Have any leaves, snow, or ice cleared regularly. Use sand or salt on walking paths during winter. Clean  up any spills in your garage right away. This includes oil or grease spills. What can I do in the bathroom? Use night lights. Install grab bars by the toilet and in the tub and shower. Do not use towel bars as grab bars. Use non-skid mats or decals in the tub or shower. If you need to sit down in the shower, use a plastic, non-slip stool. Keep the floor dry. Clean up any water that spills on the floor as soon as it happens. Remove soap buildup in the tub or shower regularly. Attach bath mats securely with double-sided non-slip rug tape. Do not have throw rugs and other things on the floor that can make you trip. What can I do in the bedroom? Use night lights. Make sure that you have a light by your bed that is easy to reach. Do not use any sheets or blankets that are too big for your bed. They should not hang down onto the floor. Have a firm chair that has side arms. You can use this for support while you get dressed. Do not have throw rugs and other things on the floor that can make you trip. What can I do in the kitchen? Clean up any spills right away. Avoid walking on wet floors. Keep items that you use a lot in easy-to-reach places. If you need to reach something above you, use a strong step stool that has a grab bar. Keep  electrical cords out of the way. Do not use floor polish or wax that makes floors slippery. If you must use wax, use non-skid floor wax. Do not have throw rugs and other things on the floor that can make you trip. What can I do with my stairs? Do not leave any items on the stairs. Make sure that there are handrails on both sides of the stairs and use them. Fix handrails that are broken or loose. Make sure that handrails are as long as the stairways. Check any carpeting to make sure that it is firmly attached to the stairs. Fix any carpet that is loose or worn. Avoid having throw rugs at the top or bottom of the stairs. If you do have throw rugs, attach them to the  floor with carpet tape. Make sure that you have a light switch at the top of the stairs and the bottom of the stairs. If you do not have them, ask someone to add them for you. What else can I do to help prevent falls? Wear shoes that: Do not have high heels. Have rubber bottoms. Are comfortable and fit you well. Are closed at the toe. Do not wear sandals. If you use a stepladder: Make sure that it is fully opened. Do not climb a closed stepladder. Make sure that both sides of the stepladder are locked into place. Ask someone to hold it for you, if possible. Clearly mark and make sure that you can see: Any grab bars or handrails. First and last steps. Where the edge of each step is. Use tools that help you move around (mobility aids) if they are needed. These include: Canes. Walkers. Scooters. Crutches. Turn on the lights when you go into a dark area. Replace any light bulbs as soon as they burn out. Set up your furniture so you have a clear path. Avoid moving your furniture around. If any of your floors are uneven, fix them. If there are any pets around you, be aware of where they are. Review your medicines with your doctor. Some medicines can make you feel dizzy. This can increase your chance of falling. Ask your doctor what other things that you can do to help prevent falls. This information is not intended to replace advice given to you by your health care provider. Make sure you discuss any questions you have with your health care provider. Document Released: 10/05/2009 Document Revised: 05/16/2016 Document Reviewed: 01/13/2015 Elsevier Interactive Patient Education  2017 [employer].

## 2021-08-30 DIAGNOSIS — R800 Isolated proteinuria: Secondary | ICD-10-CM | POA: Diagnosis not present

## 2021-08-30 DIAGNOSIS — N201 Calculus of ureter: Secondary | ICD-10-CM | POA: Diagnosis not present

## 2021-08-30 DIAGNOSIS — R310 Gross hematuria: Secondary | ICD-10-CM | POA: Diagnosis not present

## 2021-08-30 DIAGNOSIS — R82998 Other abnormal findings in urine: Secondary | ICD-10-CM | POA: Diagnosis not present

## 2021-09-03 ENCOUNTER — Telehealth: Payer: Self-pay

## 2021-09-03 ENCOUNTER — Ambulatory Visit (HOSPITAL_BASED_OUTPATIENT_CLINIC_OR_DEPARTMENT_OTHER)
Admission: RE | Admit: 2021-09-03 | Discharge: 2021-09-03 | Disposition: A | Discharge status: Home / Self Care | Payer: Medicare Other | Source: Ambulatory Visit | Attending: Cardiology | Admitting: Cardiology

## 2021-09-03 ENCOUNTER — Other Ambulatory Visit: Payer: Self-pay

## 2021-09-03 DIAGNOSIS — I358 Other nonrheumatic aortic valve disorders: Secondary | ICD-10-CM

## 2021-09-03 DIAGNOSIS — I351 Nonrheumatic aortic (valve) insufficiency: Secondary | ICD-10-CM | POA: Diagnosis not present

## 2021-09-03 DIAGNOSIS — Z950 Presence of cardiac pacemaker: Secondary | ICD-10-CM | POA: Insufficient documentation

## 2021-09-03 DIAGNOSIS — Z87891 Personal history of nicotine dependence: Secondary | ICD-10-CM | POA: Diagnosis not present

## 2021-09-03 DIAGNOSIS — I429 Cardiomyopathy, unspecified: Secondary | ICD-10-CM

## 2021-09-03 DIAGNOSIS — I119 Hypertensive heart disease without heart failure: Secondary | ICD-10-CM | POA: Insufficient documentation

## 2021-09-03 LAB — ECHOCARDIOGRAM COMPLETE
AR max vel: 2.3 cm2
AV Area VTI: 2.45 cm2
AV Area mean vel: 2.19 cm2
AV Mean grad: 6 mmHg
AV Peak grad: 10.1 mmHg
Ao pk vel: 1.59 m/s
Area-P 1/2: 3.45 cm2
Calc EF: 55.9 %
P 1/2 time: 948 msec
S' Lateral: 4.05 cm
Single Plane A2C EF: 66.4 %
Single Plane A4C EF: 47.4 %

## 2021-09-03 NOTE — Telephone Encounter (Signed)
Patient came here accidentally for her echo, while she was here she had a question about a heart monitor that she has that she said is several years old. Was wanting to know if she should get another one? If you could give her a call back at [phone]

## 2021-09-04 ENCOUNTER — Other Ambulatory Visit: Payer: Self-pay | Admitting: *Deleted

## 2021-09-04 ENCOUNTER — Telehealth: Payer: Self-pay

## 2021-09-04 DIAGNOSIS — I428 Other cardiomyopathies: Secondary | ICD-10-CM | POA: Diagnosis not present

## 2021-09-04 DIAGNOSIS — Z95810 Presence of automatic (implantable) cardiac defibrillator: Secondary | ICD-10-CM | POA: Diagnosis not present

## 2021-09-04 MED ORDER — CARVEDILOL 3.125 MG PO TABS: 3.1250 mg | ORAL_TABLET | Freq: Two times a day (BID) | ORAL | 3 refills | Status: DC

## 2021-09-04 NOTE — Telephone Encounter (Signed)
Left VM for procedure on 09/07/21.  She has a history of PVCs controlled with flecainide.   Echo yesterday with mildly reduced EF, but improved from prior. 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 Low dose BB was started. CTA in 06/2020 showed minimal nonobstructive disease with calcium score 34.   She may hold ASA for planned procedure.

## 2021-09-04 NOTE — Telephone Encounter (Signed)
   [facility] Pre-operative Risk Assessment    Request for surgical clearance:  What type of surgery is being performed? Ureteroscopic Stone Removal    When is this surgery scheduled? 09/07/2021   What type of clearance is required (medical clearance vs. Pharmacy clearance to hold med vs. Both)? Both  Are there any medications that need to be held prior to surgery and how long?Aspirin for 10 days prior   Practice name and name of physician performing surgery? Dr. Jenetta Downer' Ro[facility] at [facility]   What is your office phone number: [phone]    7.   What is your office fax number: [phone]  8.   Anesthesia type (None, local, MAC, general) Not specified   Alice Stewart 09/04/2021, 9:08 AM  _________________________________________________________________   (provider comments below)

## 2021-09-05 ENCOUNTER — Telehealth: Payer: Self-pay | Admitting: Cardiology

## 2021-09-05 NOTE — Telephone Encounter (Signed)
   Name: Alice Stewart  DOB: 1956-11-03  MRN: [id number]   Primary Cardiologist: Olga Millers, MD  Chart reviewed as part of pre-operative protocol coverage. Patient was contacted 09/05/2021 in reference to pre-operative risk assessment for pending surgery as outlined below.  Alice Stewart was last seen on 08/20/21 by Dr. Jens Som.  Since that day, Alice Stewart has done well.  She can complete more than 4.0 METS without angina. Echo 09/03/21 with mildly reduced EF, but improved from prior. 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 Low dose BB was started. CTA in 06/2020 showed minimal nonobstructive disease with calcium score 34. She does not have a history of ischemic heart disease, PCI, or stroke. She may hold ASA as requested.  Therefore, based on ACC/AHA guidelines, the patient would be at acceptable risk for the planned procedure without further cardiovascular testing.   The patient was advised that if she develops new symptoms prior to surgery to contact our office to arrange for a follow-up visit, and she verbalized understanding.  I will route this recommendation to the requesting party via Epic fax function and remove from pre-op pool. Please call with questions.  Alice Stewart Alice Woodrow, PA 09/05/2021, 11:59 AM

## 2021-09-05 NOTE — Telephone Encounter (Signed)
Pt is returning call.  

## 2021-09-05 NOTE — Telephone Encounter (Signed)
Spoke to patient Dr.Crenshaw's advice given. 

## 2021-09-05 NOTE — Telephone Encounter (Signed)
Spoke to patient she stated Dr.Crenshaw prescribed Carvedilol last week.Stated she has not started yet she remembered in the past it caused swelling in lower legs and feet.Advised I will send message to [facility] for advice.

## 2021-09-05 NOTE — Telephone Encounter (Signed)
  Pt c/o medication issue:  1. Name of Medication: carvedilol (COREG) 3.125 MG tablet  2. How are you currently taking this medication (dosage and times per day)? Take 1 tablet (3.125 mg total) by mouth 2 (two) times daily.  3. Are you having a reaction (difficulty breathing--STAT)?   4. What is your medication issue? Pt would like to ask Dr. Jens Som to change this meds, she said she tried this before and she gets swelling on her feet

## 2021-09-06 ENCOUNTER — Ambulatory Visit: Payer: Medicare Other

## 2021-09-07 DIAGNOSIS — N202 Calculus of kidney with calculus of ureter: Secondary | ICD-10-CM | POA: Diagnosis not present

## 2021-09-07 DIAGNOSIS — Z466 Encounter for fitting and adjustment of urinary device: Secondary | ICD-10-CM | POA: Diagnosis not present

## 2021-09-07 DIAGNOSIS — I2510 Atherosclerotic heart disease of native coronary artery without angina pectoris: Secondary | ICD-10-CM | POA: Diagnosis not present

## 2021-09-07 DIAGNOSIS — Z79899 Other long term (current) drug therapy: Secondary | ICD-10-CM | POA: Diagnosis not present

## 2021-09-07 DIAGNOSIS — I110 Hypertensive heart disease with heart failure: Secondary | ICD-10-CM | POA: Diagnosis not present

## 2021-09-07 DIAGNOSIS — I5020 Unspecified systolic (congestive) heart failure: Secondary | ICD-10-CM | POA: Diagnosis not present

## 2021-09-07 DIAGNOSIS — N200 Calculus of kidney: Secondary | ICD-10-CM | POA: Diagnosis not present

## 2021-09-07 DIAGNOSIS — Z87891 Personal history of nicotine dependence: Secondary | ICD-10-CM | POA: Diagnosis not present

## 2021-09-07 DIAGNOSIS — I428 Other cardiomyopathies: Secondary | ICD-10-CM | POA: Diagnosis not present

## 2021-09-07 DIAGNOSIS — I493 Ventricular premature depolarization: Secondary | ICD-10-CM | POA: Diagnosis not present

## 2021-09-07 DIAGNOSIS — N132 Hydronephrosis with renal and ureteral calculous obstruction: Secondary | ICD-10-CM | POA: Diagnosis not present

## 2021-09-11 DIAGNOSIS — Z466 Encounter for fitting and adjustment of urinary device: Secondary | ICD-10-CM | POA: Diagnosis not present

## 2021-09-11 DIAGNOSIS — N201 Calculus of ureter: Secondary | ICD-10-CM | POA: Diagnosis not present

## 2021-09-11 NOTE — Telephone Encounter (Signed)
Spoke with pt, she is wanting to know if she needs to change the st jude monitor she has because it is 63 years old. Aware I am not sure but she has an appointment on Monday 09/17/21 to establish with dr Elberta Fortis and they can answer that question for her. Location and time of appointment discussed with the patient. She will ask her questions at that appointment.

## 2021-09-17 ENCOUNTER — Ambulatory Visit (INDEPENDENT_AMBULATORY_CARE_PROVIDER_SITE_OTHER): Payer: Medicare Other | Admitting: Cardiology

## 2021-09-17 ENCOUNTER — Encounter: Payer: Self-pay | Admitting: Cardiology

## 2021-09-17 ENCOUNTER — Other Ambulatory Visit: Payer: Self-pay

## 2021-09-17 VITALS — BP 136/80 | HR 76 | Ht 64.0 in | Wt 243.0 lb

## 2021-09-17 DIAGNOSIS — I493 Ventricular premature depolarization: Secondary | ICD-10-CM

## 2021-09-17 DIAGNOSIS — Z95810 Presence of automatic (implantable) cardiac defibrillator: Secondary | ICD-10-CM | POA: Diagnosis not present

## 2021-09-17 NOTE — Progress Notes (Signed)
Electrophysiology Office Note   Date:  09/17/2021   ID:  Alice Stewart, DOB [birth year], MRN [id number]  PCP:  Everrett Coombe, DO  Cardiologist:  Jens Som Primary Electrophysiologist:  Moesha Sarchet Jorja Loa, MD    Chief Complaint: ICD   History of Present Illness: Alice Stewart is a 65 y.o. female who is being seen today for the evaluation of ICD at the request of Jens Som, Madolyn Frieze, MD. Presenting today for electrophysiology evaluation.  She has a history of systolic heart failure with left bundle branch block.  She also has outflow tract PVCs.  She had an ablation attempt, though this was unsuccessful.  She is status post [place] Jude CRT-D implanted in 2015.  CTA July 2021 shows minimal nonobstructive coronary artery disease with a calcium score of 34.  Echo 2021 showed an ejection fraction of 55 to 60%.  Her PVCs are currently treated with flecainide.  The patient recently moved to the area from [place] state.  Today, she denies symptoms of palpitations, chest pain, shortness of breath, orthopnea, PND, lower extremity edema, claudication, dizziness, presyncope, syncope, bleeding, or neurologic sequela. The patient is tolerating medications without difficulties.    Past Medical History:  Diagnosis Date   Anemia    Back pain    Benign positional vertigo    Cardiomyopathy (HCC)    Carpal tunnel syndrome    Goiter    nontoxid mutinodular   Hypertension    Hyperthyroidism    Insulin resistance syndrome    Menopausal disorder    Menorrhagia    Migraine    Muscle spasm    trapezius   Neck pain, acute    Obesity    Seborrheic dermatitis    Thyroid disorder    Past Surgical History:  Procedure Laterality Date   DILATION AND CURETTAGE OF UTERUS  04/09   ENDOMETRIAL ABLATION  04/09   GREAT TOE ARTHRODESIS, METATARSALPHALANGEAL JOINT  6/07   Rt foot   HEEL SPUR SURGERY  11/87   Lt foot   TUBAL LIGATION  11/90     Current Outpatient Medications  Medication Sig  Dispense Refill   Ascorbic Acid (VITAMIN C) 1000 MG tablet Take 1,000 mg by mouth daily.       aspirin 81 MG EC tablet Take by mouth.     Calcium 1500 MG tablet Take 1,500 mg by mouth.       flecainide (TAMBOCOR) 100 MG tablet Take 100 mg by mouth 2 (two) times daily.     furosemide (LASIX) 20 MG tablet Take 20 mg by mouth.     hydrochlorothiazide (HYDRODIURIL) 25 MG tablet Take 1 tablet by mouth daily.     losartan (COZAAR) 100 MG tablet Take 1 tablet by mouth daily.     Multiple Vitamin (MULTIVITAMIN) tablet Take 1 tablet by mouth daily.       potassium chloride SA (KLOR-CON) 20 MEQ tablet Take 40 mEq by mouth daily.     Red Clover Leaf Extract 500 MG TABS Take 1 tablet by mouth daily.       VITAMIN D, ERGOCALCIFEROL, PO Take 1,000 mg by mouth daily.      No current facility-administered medications for this visit.    Allergies:   Bystolic [nebivolol hcl], Coreg [carvedilol], Amlodipine-atorvastatin, Chlorthalidone, Diltiazem hcl, Lisinopril, Metoprolol tartrate, and Simvastatin   Social History:  The patient  reports that she quit smoking about 16 years ago. Her smoking use included cigarettes. She has never used smokeless tobacco. She reports current  alcohol use of about 1.0 standard drink per week. She reports that she does not use drugs.   Family History:  The patient's family history includes Diabetes in an other family member; Hypertension in her father and sister; Hypothyroidism in her sister; Other in her mother.    ROS:  Please see the history of present illness.   Otherwise, review of systems is positive for none.   All other systems are reviewed and negative.    PHYSICAL EXAM: VS:  BP 136/80   Pulse 76   Ht 5\' 4"  (1.626 m)   Wt 243 lb (110.2 kg)   LMP 09/27/2011   SpO2 98%   BMI 41.71 kg/m  , BMI Body mass index is 41.71 kg/m. GEN: Well nourished, well developed, in no acute distress  HEENT: normal  Neck: no JVD, carotid bruits, or masses Cardiac: RRR; no murmurs,  rubs, or gallops,no edema  Respiratory:  clear to auscultation bilaterally, normal work of breathing GI: soft, nontender, nondistended, + BS MS: no deformity or atrophy  Skin: warm and dry, device pocket is well healed Neuro:  Strength and sensation are intact Psych: euthymic mood, full affect  EKG:  EKG is not ordered today. Personal review of the ekg ordered 08/20/21 shows sinus rhythm, ventricular paced  Device interrogation is reviewed today in detail.  See PaceArt for details.   Recent Labs: No results found for requested labs within last 8760 hours.    Lipid Panel     Component Value Date/Time   CHOL 194 03/11/2007 1943   TRIG 110 03/11/2007 1943   HDL 56 03/11/2007 1943   CHOLHDL 3.5 Ratio 03/11/2007 1943   VLDL 22 03/11/2007 1943   LDLCALC 116 (H) 03/11/2007 1943     Wt Readings from Last 3 Encounters:  09/17/21 243 lb (110.2 kg)  08/20/21 248 lb 0.6 oz (112.5 kg)  07/26/21 248 lb (112.5 kg)      Other studies Reviewed: Additional studies/ records that were reviewed today include: TTE 09/03/21  Review of the above records today demonstrates:   1. Left ventricular ejection fraction, by estimation, is 45 to 50%. The  left ventricle has mildly decreased function. The left ventricle has no  regional wall motion abnormalities. There is moderate left ventricular  hypertrophy. Left ventricular  diastolic parameters are consistent with Grade I diastolic dysfunction  (impaired relaxation).   2. Right ventricular systolic function is normal. The right ventricular  size is normal.   3. The mitral valve is normal in structure. Mild mitral valve  regurgitation. No evidence of mitral stenosis.   4. The aortic valve is normal in structure. Aortic valve regurgitation is  mild. No aortic stenosis is present.   5. The inferior vena cava is normal in size with greater than 50%  respiratory variability, suggesting right atrial pressure of 3 mmHg.   ASSESSMENT AND PLAN:  1.   History of nonischemic cardiomyopathy: Fortunately ejection fraction has improved.  Currently on losartan 100 mg daily.  Is status post [facility] Jude CRT-D implanted in 2015.  Device functioning appropriately.  We Veniamin Kincaid arrange for follow-up in clinic.  2.  PVCs: Currently on flecainide 100 mg twice daily.  High risk medication monitoring.  She is ventricular pacing 99% of the time and thus having a low burden of PVCs.  We Delbert Darley continue with current management.    Current medicines are reviewed at length with the patient today.   The patient does not have concerns regarding her medicines.  The following changes were made today:  none  Labs/ tests ordered today include:  No orders of the defined types were placed in this encounter.    Disposition:   FU with Dali Kraner 1 year  Signed, Jassica Zazueta Jorja Loa, MD  09/17/2021 3:58 PM     [facility] [address] [place] [address] [phone] (office) [phone] (fax)

## 2021-09-17 NOTE — Patient Instructions (Signed)
Medication Instructions:  Your physician recommends that you continue on your current medications as directed. Please refer to the Current Medication list given to you today.  *If you need a refill on your cardiac medications before your next appointment, please call your pharmacy*   Lab Work: None ordered   Testing/Procedures: None ordered   Follow-Up: At [facility], you and your health needs are our priority.  As part of our continuing mission to provide you with exceptional heart care, we have created designated Provider Care Teams.  These Care Teams include your primary Cardiologist (physician) and Advanced Practice Providers (APPs -  Physician Assistants and Nurse Practitioners) who all work together to provide you with the care you need, when you need it.  Your next appointment:   1 year(s)  The format for your next appointment:   In Person  Provider:   Loman Brooklyn, MD    Thank you for choosing [facility]!!   Dory Horn, RN [phone]   Other Instructions   MyChart IT:   [phone]  Device clinic:  [phone]

## 2021-09-18 ENCOUNTER — Telehealth: Payer: Self-pay

## 2021-09-18 NOTE — Telephone Encounter (Signed)
I called [facility] to request the patient be release in [place].  I gave them our direct office number to call back.

## 2021-09-19 NOTE — Telephone Encounter (Signed)
The pt has been released to our clinic. She is on a monthly schedule.

## 2021-09-25 ENCOUNTER — Encounter: Payer: Self-pay | Admitting: *Deleted

## 2021-09-27 ENCOUNTER — Other Ambulatory Visit: Payer: Self-pay

## 2021-09-27 ENCOUNTER — Ambulatory Visit (INDEPENDENT_AMBULATORY_CARE_PROVIDER_SITE_OTHER): Payer: Medicare Other

## 2021-09-27 DIAGNOSIS — Z1231 Encounter for screening mammogram for malignant neoplasm of breast: Secondary | ICD-10-CM

## 2021-10-03 ENCOUNTER — Other Ambulatory Visit: Payer: Self-pay | Admitting: Family Medicine

## 2021-10-03 DIAGNOSIS — R928 Other abnormal and inconclusive findings on diagnostic imaging of breast: Secondary | ICD-10-CM

## 2021-10-15 ENCOUNTER — Ambulatory Visit (INDEPENDENT_AMBULATORY_CARE_PROVIDER_SITE_OTHER): Payer: Medicare Other

## 2021-10-15 DIAGNOSIS — I429 Cardiomyopathy, unspecified: Secondary | ICD-10-CM

## 2021-10-16 LAB — CUP PACEART REMOTE DEVICE CHECK
Battery Remaining Longevity: 5 mo
Battery Remaining Percentage: 6 %
Battery Voltage: 2.63 V
Brady Statistic AP VP Percent: 25 %
Brady Statistic AP VS Percent: 1 %
Brady Statistic AS VP Percent: 75 %
Brady Statistic AS VS Percent: 1 %
Brady Statistic RA Percent Paced: 25 %
Date Time Interrogation Session: 20221025014940
HighPow Impedance: 74 Ohm
HighPow Impedance: 74 Ohm
Implantable Lead Implant Date: 20150303
Implantable Lead Implant Date: 20150303
Implantable Lead Implant Date: 20150303
Implantable Lead Location: 753858
Implantable Lead Location: 753859
Implantable Lead Location: 753860
Implantable Pulse Generator Implant Date: 20150303
Lead Channel Impedance Value: 410 Ohm
Lead Channel Impedance Value: 530 Ohm
Lead Channel Impedance Value: 600 Ohm
Lead Channel Pacing Threshold Amplitude: 0.875 V
Lead Channel Pacing Threshold Amplitude: 1 V
Lead Channel Pacing Threshold Amplitude: 2.25 V
Lead Channel Pacing Threshold Pulse Width: 0.5 ms
Lead Channel Pacing Threshold Pulse Width: 0.5 ms
Lead Channel Pacing Threshold Pulse Width: 0.5 ms
Lead Channel Sensing Intrinsic Amplitude: 12 mV
Lead Channel Sensing Intrinsic Amplitude: 2.4 mV
Lead Channel Setting Pacing Amplitude: 1.875
Lead Channel Setting Pacing Amplitude: 2 V
Lead Channel Setting Pacing Amplitude: 3 V
Lead Channel Setting Pacing Pulse Width: 0.5 ms
Lead Channel Setting Pacing Pulse Width: 0.5 ms
Lead Channel Setting Sensing Sensitivity: 0.5 mV
Pulse Gen Serial Number: 7153470

## 2021-10-17 ENCOUNTER — Ambulatory Visit
Admission: RE | Admit: 2021-10-17 | Discharge: 2021-10-17 | Disposition: A | Discharge status: Home / Self Care | Payer: Medicare Other | Source: Ambulatory Visit | Attending: Family Medicine | Admitting: Family Medicine

## 2021-10-17 ENCOUNTER — Other Ambulatory Visit: Payer: Self-pay

## 2021-10-17 DIAGNOSIS — N6001 Solitary cyst of right breast: Secondary | ICD-10-CM | POA: Diagnosis not present

## 2021-10-17 DIAGNOSIS — R928 Other abnormal and inconclusive findings on diagnostic imaging of breast: Secondary | ICD-10-CM

## 2021-10-23 DIAGNOSIS — Z9889 Other specified postprocedural states: Secondary | ICD-10-CM | POA: Diagnosis not present

## 2021-10-23 DIAGNOSIS — N281 Cyst of kidney, acquired: Secondary | ICD-10-CM | POA: Diagnosis not present

## 2021-10-23 DIAGNOSIS — N200 Calculus of kidney: Secondary | ICD-10-CM | POA: Diagnosis not present

## 2021-10-23 DIAGNOSIS — N201 Calculus of ureter: Secondary | ICD-10-CM | POA: Diagnosis not present

## 2021-10-23 NOTE — Progress Notes (Signed)
Remote ICD transmission.   

## 2021-11-01 ENCOUNTER — Encounter: Payer: Self-pay | Admitting: Obstetrics and Gynecology

## 2021-11-01 ENCOUNTER — Other Ambulatory Visit (HOSPITAL_COMMUNITY)
Admission: RE | Admit: 2021-11-01 | Discharge: 2021-11-01 | Disposition: A | Discharge status: Home / Self Care | Payer: Medicare Other | Source: Ambulatory Visit | Attending: Obstetrics and Gynecology | Admitting: Obstetrics and Gynecology

## 2021-11-01 ENCOUNTER — Other Ambulatory Visit: Payer: Self-pay

## 2021-11-01 ENCOUNTER — Ambulatory Visit (INDEPENDENT_AMBULATORY_CARE_PROVIDER_SITE_OTHER): Payer: Medicare Other | Admitting: Obstetrics and Gynecology

## 2021-11-01 VITALS — BP 174/76 | HR 62 | Resp 16 | Ht 64.0 in | Wt 246.0 lb

## 2021-11-01 DIAGNOSIS — N898 Other specified noninflammatory disorders of vagina: Secondary | ICD-10-CM | POA: Diagnosis not present

## 2021-11-01 MED ORDER — ESTRADIOL 10 MCG VA TABS: 1.0000 | ORAL_TABLET | VAGINAL | 3 refills | Status: DC

## 2021-11-01 NOTE — Progress Notes (Signed)
GYNECOLOGY OFFICE VISIT NOTE  History:   Alice Stewart is a 65 y.o. [phone] here today for vaginal dryness and odor.   Partner takes Viagra and pt thinks that the Viagra comes through his semen.  She has a strong "fishy" odor after intercourse.  Has burning itching @ the clitoris area after intercourse. She feels like she has a cut or something similar in that area similar to if she had an ulcer. She has not seen an ulcer in that area. She notes he is uncircumcised and feels like it might be contributing to bacteria changes for her.   She does use soap in the vulvar area.   She uses Replens regularly for the past 1.5 months. She used [place] but had burning. She did Premarin in the past but it caused infections.    She denies any abnormal vaginal discharge, bleeding, pelvic pain or other concerns.   The following portions of the patient's history were reviewed and updated as appropriate: allergies, current medications, past family history, past medical history, past social history, past surgical history and problem list.   Health Maintenance:    Diagnosis  Date Value Ref Range Status  06/29/2021 (A)  Final   - Atypical squamous cells of undetermined significance (ASC-US)     Abnormal mammogram on 10/02/21 but follow up testing was normal.   Review of Systems:  Pertinent items noted in HPI and remainder of comprehensive ROS otherwise negative.  Physical Exam:  BP (!) 174/76   Pulse 62   Resp 16   Ht 5\' 4"  (1.626 m)   Wt 246 lb (111.6 kg)   LMP 09/27/2011   BMI 42.23 kg/m  CONSTITUTIONAL: Well-developed, well-nourished female in no acute distress.  HEENT:  Normocephalic, atraumatic. External right and left ear normal. No scleral icterus.  NECK: Normal range of motion, supple, no masses noted on observation SKIN: No rash noted. Not diaphoretic. No erythema. No pallor. MUSCULOSKELETAL: Normal range of motion. No edema noted. NEUROLOGIC: Alert and oriented to person, place,  and time. Normal muscle tone coordination. No cranial nerve deficit noted. PSYCHIATRIC: Normal mood and affect. Normal behavior. Normal judgment and thought content.  CARDIOVASCULAR: Normal heart rate noted RESPIRATORY: Effort and breath sounds normal, no problems with respiration noted ABDOMEN: No masses noted. No other overt distention noted.    PELVIC: Normal appearing external genitalia; normal urethral meatus with small urethral prolapse; normal appearing vaginal mucosa.  No abnormal discharge noted.  Normal cervix and uterine size, no other palpable masses, no uterine or adnexal tenderness. Performed in the presence of a chaperone  Labs and Imaging No results found for this or any previous visit (from the past 168 hour(s)). 11/27/2011 BREAST LTD UNI RIGHT INC AXILLA  Result Date: 10/17/2021 CLINICAL DATA:  Screening recall for a possible right breast mass. EXAM: ULTRASOUND OF THE RIGHT BREAST COMPARISON:  Previous exam(s). FINDINGS: On physical exam, no mass is palpated in the upper right breast. Targeted ultrasound is performed, showing an oval cyst in the right breast at 12 o'clock, 6 cm the nipple, measuring 6 x 4 x 5 mm. Containing an echogenic focus along 1 wall consistent with a calcification, corresponding to a small calcification noted along the periphery of the mass mammographically. There is no internal blood flow on color Doppler analysis. IMPRESSION: 1. No evidence of breast malignancy. 2. Benign right breast cyst. RECOMMENDATION: Screening mammogram in one year.(Code:SM-B-01Y) I have discussed the findings and recommendations with the patient. If applicable, a reminder letter  will be sent to the patient regarding the next appointment. BI-RADS CATEGORY  2: Benign. Electronically Signed   By: Amie Portland M.D.   On: 10/17/2021 14:43   CUP PACEART REMOTE DEVICE CHECK  Result Date: 10/16/2021 Scheduled remote reviewed. Normal device function.  Battery estimated 4.48mo Next remote 11/21 LR    Assessment and Plan:    1. Vaginal odor - Discussed vulvar hygiene and impact of soaps on normal flora and breaks in skin. She will try no soap and warm wash cloth - Discussed coconut oil for barrier - Vaginal estrogen as a tablet may also help by promoting blood flow - appeared like possible small fissure around the clitoris in area of concern.  - Cultures done as well.  - Cervicovaginal ancillary only( [place]) - Estradiol 10 MCG TABS vaginal tablet; Place 1 tablet (10 mcg total) vaginally 2 (two) times a week.  Dispense: 24 tablet; Refill: 3  2. Vaginal dryness - Discussed silicone and oil based lubricants. Samples given for silicone based lubricants. Discussed routine use of estradiol - may do once/twice a week.  - Estradiol 10 MCG TABS vaginal tablet; Place 1 tablet (10 mcg total) vaginally 2 (two) times a week.  Dispense: 24 tablet; Refill: 3   Routine preventative health maintenance measures emphasized. Please refer to After Visit Summary for other counseling recommendations.   Return if symptoms worsen or fail to improve.  Milas Hock, MD, FACOG Obstetrician & Gynecologist, [facility] for [facility], [facility]

## 2021-11-02 LAB — CERVICOVAGINAL ANCILLARY ONLY
Bacterial Vaginitis (gardnerella): POSITIVE — AB
Candida Glabrata: NEGATIVE
Candida Vaginitis: POSITIVE — AB
Comment: NEGATIVE
Comment: NEGATIVE
Comment: NEGATIVE

## 2021-11-02 MED ORDER — METRONIDAZOLE 500 MG PO TABS: 500.0000 mg | ORAL_TABLET | Freq: Two times a day (BID) | ORAL | 0 refills | Status: DC

## 2021-11-02 MED ORDER — FLUCONAZOLE 150 MG PO TABS: 150.0000 mg | ORAL_TABLET | ORAL | 3 refills | Status: DC

## 2021-11-02 NOTE — Addendum Note (Signed)
Addended by: Milas Hock A on: 11/02/2021 01:59 PM   Modules accepted: Orders

## 2021-11-12 ENCOUNTER — Ambulatory Visit (INDEPENDENT_AMBULATORY_CARE_PROVIDER_SITE_OTHER): Payer: Medicare Other

## 2021-11-12 DIAGNOSIS — I429 Cardiomyopathy, unspecified: Secondary | ICD-10-CM

## 2021-11-17 LAB — CUP PACEART REMOTE DEVICE CHECK
Battery Remaining Longevity: 4 mo
Battery Remaining Percentage: 4 %
Battery Voltage: 2.63 V
Brady Statistic AP VP Percent: 25 %
Brady Statistic AP VS Percent: 1 %
Brady Statistic AS VP Percent: 75 %
Brady Statistic AS VS Percent: 1 %
Brady Statistic RA Percent Paced: 25 %
Date Time Interrogation Session: 20221123093234
HighPow Impedance: 71 Ohm
HighPow Impedance: 71 Ohm
Implantable Lead Implant Date: 20150303
Implantable Lead Implant Date: 20150303
Implantable Lead Implant Date: 20150303
Implantable Lead Location: 753858
Implantable Lead Location: 753859
Implantable Lead Location: 753860
Implantable Pulse Generator Implant Date: 20150303
Lead Channel Impedance Value: 410 Ohm
Lead Channel Impedance Value: 490 Ohm
Lead Channel Impedance Value: 600 Ohm
Lead Channel Pacing Threshold Amplitude: 0.875 V
Lead Channel Pacing Threshold Amplitude: 1.125 V
Lead Channel Pacing Threshold Amplitude: 2.25 V
Lead Channel Pacing Threshold Pulse Width: 0.5 ms
Lead Channel Pacing Threshold Pulse Width: 0.5 ms
Lead Channel Pacing Threshold Pulse Width: 0.5 ms
Lead Channel Sensing Intrinsic Amplitude: 12 mV
Lead Channel Sensing Intrinsic Amplitude: 3.5 mV
Lead Channel Setting Pacing Amplitude: 1.875
Lead Channel Setting Pacing Amplitude: 2.125
Lead Channel Setting Pacing Amplitude: 3 V
Lead Channel Setting Pacing Pulse Width: 0.5 ms
Lead Channel Setting Pacing Pulse Width: 0.5 ms
Lead Channel Setting Sensing Sensitivity: 0.5 mV
Pulse Gen Serial Number: 7153470

## 2021-11-19 ENCOUNTER — Telehealth: Payer: Self-pay | Admitting: Family Medicine

## 2021-11-19 NOTE — Telephone Encounter (Signed)
Pt placed call thru her insurance provider. [facility]) She would like to switch pcp from Dr.Matthews to [employer]. She stated that she would be more comfortable with a female provider.

## 2021-11-19 NOTE — Telephone Encounter (Signed)
Ok with me 

## 2021-11-20 NOTE — Addendum Note (Signed)
Addended by: Elease Etienne A on: 11/20/2021 10:03 AM   Modules accepted: Level of Service

## 2021-11-20 NOTE — Progress Notes (Signed)
Remote ICD transmission.   

## 2021-11-22 ENCOUNTER — Telehealth: Payer: Self-pay

## 2021-11-22 NOTE — Telephone Encounter (Signed)
Patient would like a call back she is having palps and once someone to go over her transmission from yesterday

## 2021-11-22 NOTE — Telephone Encounter (Signed)
Manual transmission reviewed, nothing on report to explain palpitations.  Pt does have known history of PVCs, current burden low @ 1%.    Pt indicates the palpitations do not occur often mostly occuring at night if she is having trouble sleeping.    Discussed gen changeout procedure and what to expect as battery life depletes.

## 2021-11-26 ENCOUNTER — Ambulatory Visit (INDEPENDENT_AMBULATORY_CARE_PROVIDER_SITE_OTHER): Payer: Medicare Other | Admitting: Medical-Surgical

## 2021-11-26 ENCOUNTER — Other Ambulatory Visit: Payer: Self-pay

## 2021-11-26 ENCOUNTER — Encounter: Payer: Self-pay | Admitting: Medical-Surgical

## 2021-11-26 VITALS — BP 159/85 | HR 74 | Resp 20 | Ht 64.0 in | Wt 242.0 lb

## 2021-11-26 DIAGNOSIS — M797 Fibromyalgia: Secondary | ICD-10-CM

## 2021-11-26 DIAGNOSIS — M2550 Pain in unspecified joint: Secondary | ICD-10-CM | POA: Diagnosis not present

## 2021-11-26 DIAGNOSIS — Z7689 Persons encountering health services in other specified circumstances: Secondary | ICD-10-CM

## 2021-11-26 DIAGNOSIS — M25551 Pain in right hip: Secondary | ICD-10-CM

## 2021-11-26 MED ORDER — PREGABALIN 25 MG PO CAPS: ORAL_CAPSULE | ORAL | 0 refills | Status: DC

## 2021-11-26 NOTE — Progress Notes (Signed)
  HPI with pertinent ROS:   CC: Lower back pain, right hip pain  HPI: Pleasant 65 year old female presenting today to transfer care to a new PCP and for generalized body aches and pains.  She is having significant lower back pain that affects her right hip and right lower extremity.  She also has issues with her shoulders and upper back.  Notes a history of fibromyalgia.  Not currently on any medications to treat this due to multiple allergies and/or intolerances.  Has been taking 1000 mg of Tylenol on an as-needed basis which is the only thing she has found that gives her any kind of relief.  She also notes that heating pad is helpful.  She is used several over-the-counter muscle rubs with temporary relief.  She tried Cymbalta in the past but reports they started her on 75 mg and she did not tolerate this.  She also did not tolerate Celebrex.  She used to take tramadol and notes this was helpful but she was taken off and does not know why.  Has been dealing with right hip bursitis after having to pull a heavy weight for some distance.  She has had a shot in it but the shot has not helped.  Is not sure if she has ever tried gabapentin or Lyrica although she does know gabapentin sounds familiar.  Would like to have a referral to massage therapy if covered by insurance because she knows that massage therapy about once a week is helpful for her pains.  I reviewed the past medical history, family history, social history, surgical history, and allergies today and no changes were needed.  Please see the problem list section below in epic for further details.   Physical exam:   General: Well Developed, well nourished, and in no acute distress.  Neuro: Alert and oriented x3.  HEENT: Normocephalic, atraumatic.  Skin: Warm and dry. Cardiac: Regular rate and rhythm, no murmurs rubs or gallops, no lower extremity edema.  Respiratory: Clear to auscultation bilaterally. Not using accessory muscles, speaking in  full sentences.  Impression and Recommendations:    1. Fibromyalgia 2. Arthralgia, unspecified joint With multiple joints involved along with widespread myalgias, I feel this is more related to fibromyalgia rather than a specific joint etiology.  Discussed various options used to help treat fibromyalgia pain.  She is very susceptible to side effects of medications so would recommend starting anything at a very low-dose with a slow titration up as tolerated.  Reviewed options for gabapentin, Lyrica, amitriptyline, Effexor, and retrying Cymbalta at a low dose.  She would like to proceed with a very low-dose of Lyrica so we will start at 25 mg nightly for 1 week and titrate up on a weekly basis as patient tolerates.  Referral placed for physical therapy to see if we can get her in with a massage therapist that may be covered by her insurance. - Ambulatory referral to Physical Therapy  No follow-ups on file. ___________________________________________ [name], DNP, APRN, FNP-BC Primary Care and Sports Medicine [facility]

## 2021-12-06 DIAGNOSIS — I428 Other cardiomyopathies: Secondary | ICD-10-CM | POA: Diagnosis not present

## 2021-12-06 DIAGNOSIS — I429 Cardiomyopathy, unspecified: Secondary | ICD-10-CM | POA: Diagnosis not present

## 2021-12-06 DIAGNOSIS — Z4502 Encounter for adjustment and management of automatic implantable cardiac defibrillator: Secondary | ICD-10-CM | POA: Diagnosis not present

## 2021-12-06 DIAGNOSIS — R42 Dizziness and giddiness: Secondary | ICD-10-CM | POA: Diagnosis not present

## 2021-12-06 DIAGNOSIS — Z87891 Personal history of nicotine dependence: Secondary | ICD-10-CM | POA: Diagnosis not present

## 2021-12-06 DIAGNOSIS — I498 Other specified cardiac arrhythmias: Secondary | ICD-10-CM | POA: Diagnosis not present

## 2021-12-06 DIAGNOSIS — I5022 Chronic systolic (congestive) heart failure: Secondary | ICD-10-CM | POA: Diagnosis not present

## 2021-12-06 DIAGNOSIS — Z95810 Presence of automatic (implantable) cardiac defibrillator: Secondary | ICD-10-CM | POA: Diagnosis not present

## 2021-12-06 DIAGNOSIS — I493 Ventricular premature depolarization: Secondary | ICD-10-CM | POA: Diagnosis not present

## 2021-12-06 DIAGNOSIS — R519 Headache, unspecified: Secondary | ICD-10-CM | POA: Diagnosis not present

## 2021-12-06 DIAGNOSIS — R5383 Other fatigue: Secondary | ICD-10-CM | POA: Diagnosis not present

## 2021-12-06 DIAGNOSIS — I110 Hypertensive heart disease with heart failure: Secondary | ICD-10-CM | POA: Diagnosis not present

## 2021-12-10 ENCOUNTER — Other Ambulatory Visit: Payer: Self-pay

## 2021-12-10 ENCOUNTER — Encounter: Payer: Self-pay | Admitting: Obstetrics & Gynecology

## 2021-12-10 ENCOUNTER — Ambulatory Visit (INDEPENDENT_AMBULATORY_CARE_PROVIDER_SITE_OTHER): Payer: Medicare Other | Admitting: Obstetrics & Gynecology

## 2021-12-10 ENCOUNTER — Other Ambulatory Visit (HOSPITAL_COMMUNITY)
Admission: RE | Admit: 2021-12-10 | Discharge: 2021-12-10 | Disposition: A | Discharge status: Home / Self Care | Payer: Medicare Other | Source: Ambulatory Visit | Attending: Obstetrics & Gynecology | Admitting: Obstetrics & Gynecology

## 2021-12-10 VITALS — BP 239/116 | HR 75 | Resp 16 | Ht 64.0 in | Wt 245.0 lb

## 2021-12-10 DIAGNOSIS — I10 Essential (primary) hypertension: Secondary | ICD-10-CM | POA: Diagnosis not present

## 2021-12-10 DIAGNOSIS — N898 Other specified noninflammatory disorders of vagina: Secondary | ICD-10-CM | POA: Insufficient documentation

## 2021-12-10 NOTE — Progress Notes (Signed)
° °  Subjective:    Patient ID: Alice Stewart, female    DOB: [birth year], 65 y.o.   MRN: [id number]  HPI  Pt c/o vaginal discharge.  Unsure if the BV/Yeast never went away or it is a recurrence.  Pt taking replens and vagi fem.  BP noted to be very high.  Pt attributes to eating ham and beans last night.  She has taekn her meds.  She denies headache nor vision changes.   Review of Systems  Constitutional: Negative.   Respiratory: Negative.    Cardiovascular: Negative.   Gastrointestinal: Negative.   Genitourinary:  Positive for vaginal discharge and vaginal pain.      Objective:   Physical Exam Vitals reviewed.  Constitutional:      General: She is not in acute distress.    Appearance: She is well-developed.  HENT:     Head: Normocephalic and atraumatic.  Eyes:     Conjunctiva/sclera: Conjunctivae normal.  Cardiovascular:     Rate and Rhythm: Normal rate.  Pulmonary:     Effort: Pulmonary effort is normal.  Genitourinary:    Comments: Tanner V Vulva:  No lesion Vagina:  Pink, no lesions, small amt white discharge, no blood Cervix:  No CMT   Skin:    General: Skin is warm and dry.  Neurological:     Mental Status: She is alert and oriented to person, place, and time.  Psychiatric:        Mood and Affect: Mood normal.   Vitals:   12/10/21 1544  BP: (!) 239/116  Pulse: 75  Resp: 16  Weight: 245 lb (111.1 kg)  Height: 5\' 4"  (1.626 m)           Assessment & Plan:  65 yo female with recurrent symptoms of BV and yeast.   Aptima for STD and vaginitis Will treat with 3 doses of diflucan and / or Flagyl for BV Stop Replens for now. TOC needed on day after last pill of Flagyl  Pt encouraged to make urgent appt with PCP; I also sent her a note about BP elevated.   30 minutes spent in the encounter with review or records and labs, history & physical, coordination of care, and documentation.

## 2021-12-11 ENCOUNTER — Ambulatory Visit: Payer: Medicare Other | Admitting: Family Medicine

## 2021-12-12 LAB — CERVICOVAGINAL ANCILLARY ONLY
Bacterial Vaginitis (gardnerella): NEGATIVE
Candida Glabrata: NEGATIVE
Candida Vaginitis: NEGATIVE
Comment: NEGATIVE
Comment: NEGATIVE
Comment: NEGATIVE

## 2021-12-13 DIAGNOSIS — Z4502 Encounter for adjustment and management of automatic implantable cardiac defibrillator: Secondary | ICD-10-CM | POA: Diagnosis not present

## 2021-12-13 DIAGNOSIS — I429 Cardiomyopathy, unspecified: Secondary | ICD-10-CM | POA: Diagnosis not present

## 2021-12-14 ENCOUNTER — Other Ambulatory Visit: Payer: Self-pay

## 2021-12-14 ENCOUNTER — Encounter: Payer: Self-pay | Admitting: Family Medicine

## 2021-12-14 ENCOUNTER — Ambulatory Visit (INDEPENDENT_AMBULATORY_CARE_PROVIDER_SITE_OTHER): Payer: Medicare Other | Admitting: Family Medicine

## 2021-12-14 VITALS — BP 154/76 | HR 67 | Temp 98.4°F | Ht 64.0 in | Wt 241.1 lb

## 2021-12-14 DIAGNOSIS — I10 Essential (primary) hypertension: Secondary | ICD-10-CM

## 2021-12-14 MED ORDER — HYDROCHLOROTHIAZIDE 50 MG PO TABS
50.0000 mg | ORAL_TABLET | Freq: Every day | ORAL | 0 refills | Status: DC
Start: 2021-12-14 — End: 2022-02-20

## 2021-12-14 NOTE — Progress Notes (Signed)
Acute Office Visit  Subjective:    Patient ID: Alice Stewart, female    DOB: [birth year], 65 y.o.   MRN: [id number]  Chief Complaint  Patient presents with   Hypertension    Hypertension  Patient is in today for blood pressure.   Patient reports she has been struggling with her blood pressure for the past month or so, but symptoms have gotten worse this week. She has been having dull, generalized headaches that she describes like pressure or squeezing. She has had some dyspnea with exertion and occasional dizziness/lightheadedness without spinning sensations. She denies any chest pain, vision changes, edema. Reports she doesn't use salt, but did have pork over Thanksgiving. She has typically had 2 cups of coffee per day, but is trying to cut back.   She went to her GYN appointment on Monday and her BP was 239/116.    Past Medical History:  Diagnosis Date   Anemia    Back pain    Benign positional vertigo    Cardiomyopathy (HCC)    Carpal tunnel syndrome    Goiter    nontoxid mutinodular   Hypertension    Hyperthyroidism    Insulin resistance syndrome    Menopausal disorder    Menorrhagia    Migraine    Muscle spasm    trapezius   Neck pain, acute    Obesity    Seborrheic dermatitis    Thyroid disorder     Past Surgical History:  Procedure Laterality Date   DILATION AND CURETTAGE OF UTERUS  04/09   ENDOMETRIAL ABLATION  04/09   GREAT TOE ARTHRODESIS, METATARSALPHALANGEAL JOINT  6/07   Rt foot   HEEL SPUR SURGERY  11/87   Lt foot   TUBAL LIGATION  11/90    Family History  Problem Relation Age of Onset   Other Mother        MS   Hypertension Father    Hypothyroidism Sister    Hypertension Sister    Diabetes Other     Social History   Socioeconomic History   Marital status: Divorced    Spouse name: Not on file   Number of children: 3   Years of education: 14   Highest education level: Not on file  Occupational History   Occupation: unemployed     Employer: [id number]  Tobacco Use   Smoking status: Former    Types: Cigarettes    Quit date: 09/22/2004    Years since quitting: 17.2   Smokeless tobacco: Never  Vaping Use   Vaping Use: Never used  Substance and Sexual Activity   Alcohol use: Yes    Alcohol/week: 1.0 standard drink    Types: 1 Standard drinks or equivalent per week    Comment: 1 weekly   Drug use: No   Sexual activity: Yes    Birth control/protection: Post-menopausal  Other Topics Concern   Not on file  Social History Narrative   3-4 caffeinated drinks per day   No regular exercise   Social Determinants of Health   Financial Resource Strain: Low Risk    Difficulty of Paying Living Expenses: Not hard at all  Food Insecurity: No Food Insecurity   Worried About Programme researcher, broadcasting/film/video in the Last Year: Never true   Ran Out of Food in the Last Year: Never true  Transportation Needs: No Transportation Needs   Lack of Transportation (Medical): No   Lack of Transportation (Non-Medical): No  Physical Activity: Sufficiently Active  Days of Exercise per Week: 5 days   Minutes of Exercise per Session: 30 min  Stress: No Stress Concern Present   Feeling of Stress : Not at all  Social Connections: Moderately Isolated   Frequency of Communication with Friends and Family: More than three times a week   Frequency of Social Gatherings with Friends and Family: More than three times a week   Attends Religious Services: 1 to 4 times per year   Active Member of [employer] or Organizations: No   Attends Banker Meetings: Never   Marital Status: Divorced  Catering manager Violence: Not At Risk   Fear of Current or Ex-Partner: No   Emotionally Abused: No   Physically Abused: No   Sexually Abused: No    Outpatient Medications Prior to Visit  Medication Sig Dispense Refill   Ascorbic Acid (VITAMIN C) 1000 MG tablet Take 1,000 mg by mouth daily.       aspirin 81 MG EC tablet Take by mouth.     Calcium 1500 MG  tablet Take 1,500 mg by mouth.       flecainide (TAMBOCOR) 100 MG tablet Take 100 mg by mouth 2 (two) times daily.     furosemide (LASIX) 20 MG tablet Take 20 mg by mouth.     losartan (COZAAR) 100 MG tablet Take 1 tablet by mouth daily.     Multiple Vitamin (MULTIVITAMIN) tablet Take 1 tablet by mouth daily.       potassium chloride SA (KLOR-CON) 20 MEQ tablet Take 40 mEq by mouth daily.     Red Clover Leaf Extract 500 MG TABS Take 1 tablet by mouth daily.     VITAMIN D, ERGOCALCIFEROL, PO Take 1,000 mg by mouth daily.      hydrochlorothiazide (HYDRODIURIL) 25 MG tablet Take 1 tablet by mouth daily.     Estradiol 10 MCG TABS vaginal tablet Place 1 tablet (10 mcg total) vaginally 2 (two) times a week. 24 tablet 3   No facility-administered medications prior to visit.    Allergies  Allergen Reactions   Bystolic [Nebivolol Hcl] Other (See Comments)    Nausea.    Coreg [Carvedilol] Swelling   Estradiol Itching    Per patient, medication caused her to have a severe yeast reaction.    Amlodipine-Atorvastatin Other (See Comments)    salty taste in mouth, stomach burning, H/A   Chlorthalidone Nausea Only    Headache   Diltiazem Hcl Other (See Comments)    Dizzy   Lisinopril     backache   Metoprolol Tartrate Other (See Comments)    nausea,HA, dizziness   Simvastatin Other (See Comments)    Myalgia     Review of Systems All review of systems negative except what is listed in the HPI     Objective:    Physical Exam Vitals reviewed.  Constitutional:      Appearance: Normal appearance. She is obese.  Cardiovascular:     Rate and Rhythm: Normal rate and regular rhythm.     Heart sounds: Murmur heard.  Pulmonary:     Effort: Pulmonary effort is normal.     Breath sounds: Normal breath sounds.  Musculoskeletal:     Right lower leg: No edema.     Left lower leg: No edema.  Skin:    General: Skin is warm and dry.  Neurological:     General: No focal deficit present.      Mental Status: She is alert and oriented to person,  place, and time. Mental status is at baseline.     Coordination: Coordination normal.     Gait: Gait normal.  Psychiatric:        Mood and Affect: Mood normal.        Behavior: Behavior normal.        Thought Content: Thought content normal.        Judgment: Judgment normal.    BP (!) 154/76 (BP Location: Left Arm, Patient Position: Sitting, Cuff Size: Large)    Pulse 67    Temp 98.4 F (36.9 C) (Oral)    Ht 5\' 4"  (1.626 m)    Wt 241 lb 1.3 oz (109.4 kg)    LMP 09/27/2011    SpO2 98%    BMI 41.38 kg/m  Wt Readings from Last 3 Encounters:  12/14/21 241 lb 1.3 oz (109.4 kg)  12/10/21 245 lb (111.1 kg)  11/26/21 242 lb (109.8 kg)    Health Maintenance Due  Topic Date Due   Pneumococcal Vaccine 67-68 Years old (2 - PCV) 01/26/2022    There are no preventive care reminders to display for this patient.   Lab Results  Component Value Date   TSH 1.77 10/08/2011   Lab Results  Component Value Date   WBC 5.1 11/26/2011   HGB 14.1 11/26/2011   HCT 41.9 11/26/2011   MCV 93.5 11/26/2011   PLT 250 11/26/2011   Lab Results  Component Value Date   NA 139 11/13/2010   K 3.4 (L) 11/13/2010   CO2 30 11/13/2010   GLUCOSE 121 (H) 11/13/2010   BUN 12 11/13/2010   CREATININE 0.90 11/13/2010   BILITOT 0.9 03/11/2007   ALKPHOS 96 03/11/2007   AST 13 03/11/2007   ALT 13 03/11/2007   PROT 7.6 03/11/2007   ALBUMIN 4.1 03/11/2007   CALCIUM 10.3 11/13/2010   Lab Results  Component Value Date   CHOL 194 03/11/2007   Lab Results  Component Value Date   HDL 56 03/11/2007   Lab Results  Component Value Date   LDLCALC 116 (H) 03/11/2007   Lab Results  Component Value Date   TRIG 110 03/11/2007   Lab Results  Component Value Date   CHOLHDL 3.5 Ratio 03/11/2007   No results found for: HGBA1C     Assessment & Plan:   1. HYPERTENSION, BENIGN SYSTEMIC Blood pressure is not at goal for age and co-morbidities.  I recommend  increasing HCTZ to 50 mg daily.  In addition they were instructed on the following: - BP goal <130/80 - monitor and log blood pressures at home - check around the same time each day in a relaxed setting - Limit salt to <2000 mg/day - Follow DASH eating plan (heart healthy diet) - limit alcohol to 2 standard drinks per day for men and 1 per day for women - avoid tobacco products - get at least 2 hours of regular aerobic exercise weekly Patient aware of signs/symptoms requiring further/urgent evaluation.   - hydrochlorothiazide (HYDRODIURIL) 50 MG tablet; Take 1 tablet (50 mg total) by mouth daily.  Dispense: 90 tablet; Refill: 0 - BASIC METABOLIC PANEL WITH GFR; Future   Follow-up in 2 weeks for nurse visit BP check (bring home BP cuff) and BMP at the lab.   03/13/2007 Lollie Marrow, DNP, FNP-C

## 2021-12-14 NOTE — Patient Instructions (Addendum)
Increasing hydrochlorothiazide to 50 mg daily. I sent in a new prescription, but if you still have the 25 mg tablets, you can take 2 tablets per day to make 50 mg total until you run out.  At your 2 week follow-up, bring your home blood pressure machine so we can compare readings.

## 2021-12-18 ENCOUNTER — Ambulatory Visit (INDEPENDENT_AMBULATORY_CARE_PROVIDER_SITE_OTHER): Payer: Medicare Other

## 2021-12-18 DIAGNOSIS — I429 Cardiomyopathy, unspecified: Secondary | ICD-10-CM

## 2021-12-18 LAB — CUP PACEART REMOTE DEVICE CHECK
Battery Remaining Longevity: 1 mo
Battery Remaining Percentage: 3 %
Battery Voltage: 2.6 V
Brady Statistic AP VP Percent: 20 %
Brady Statistic AP VS Percent: 1 %
Brady Statistic AS VP Percent: 80 %
Brady Statistic AS VS Percent: 1 %
Brady Statistic RA Percent Paced: 20 %
Date Time Interrogation Session: 20221225040017
HighPow Impedance: 70 Ohm
HighPow Impedance: 70 Ohm
Implantable Lead Implant Date: 20150303
Implantable Lead Implant Date: 20150303
Implantable Lead Implant Date: 20150303
Implantable Lead Location: 753858
Implantable Lead Location: 753859
Implantable Lead Location: 753860
Implantable Pulse Generator Implant Date: 20150303
Lead Channel Impedance Value: 400 Ohm
Lead Channel Impedance Value: 490 Ohm
Lead Channel Impedance Value: 560 Ohm
Lead Channel Pacing Threshold Amplitude: 0.625 V
Lead Channel Pacing Threshold Amplitude: 1 V
Lead Channel Pacing Threshold Amplitude: 2.25 V
Lead Channel Pacing Threshold Pulse Width: 0.5 ms
Lead Channel Pacing Threshold Pulse Width: 0.5 ms
Lead Channel Pacing Threshold Pulse Width: 0.5 ms
Lead Channel Sensing Intrinsic Amplitude: 12 mV
Lead Channel Sensing Intrinsic Amplitude: 3 mV
Lead Channel Setting Pacing Amplitude: 1.625
Lead Channel Setting Pacing Amplitude: 2 V
Lead Channel Setting Pacing Amplitude: 3 V
Lead Channel Setting Pacing Pulse Width: 0.5 ms
Lead Channel Setting Pacing Pulse Width: 0.5 ms
Lead Channel Setting Sensing Sensitivity: 0.5 mV
Pulse Gen Serial Number: 7153470

## 2021-12-26 NOTE — Progress Notes (Signed)
Remote ICD transmission.   

## 2021-12-28 ENCOUNTER — Ambulatory Visit (INDEPENDENT_AMBULATORY_CARE_PROVIDER_SITE_OTHER): Payer: Medicaid Other | Admitting: Medical-Surgical

## 2021-12-28 ENCOUNTER — Other Ambulatory Visit: Payer: Self-pay

## 2021-12-28 ENCOUNTER — Other Ambulatory Visit: Payer: Self-pay | Admitting: Medical-Surgical

## 2021-12-28 VITALS — BP 151/61 | HR 68

## 2021-12-28 DIAGNOSIS — I10 Essential (primary) hypertension: Secondary | ICD-10-CM

## 2021-12-28 MED ORDER — VALSARTAN 160 MG PO TABS: 160.0000 mg | ORAL_TABLET | Freq: Every day | ORAL | 0 refills | Status: DC

## 2021-12-28 NOTE — Progress Notes (Signed)
Patient presents today as a nurse visit for a blood pressure check.  Patient states she is taking her medication as prescribed without any side effects/adverse effects. Medication and allergy list reviewed with patient and the pharmacy has been verified.   HA: No Dizziness/lightheadedness: No Fever: No BA: No Weakness/Fatigue: No  Sinus pain/pressure: No  Runny nose: No  ST: No  ShOB: No  CP: No  Palps: No Abd pain: No Dysuria: No  N/V/C/D: No     Vital Signs at 2:39 PM with her monitor Blood Pressure: 192/82 Pulse: 69  Vital Signs at 2:41 PM Blood Pressure: 149/59 Pulse: 69 SpO2: 99%  Vital Signs at 2:54 PM Blood Pressure: 151/61 Pulse: 68 SpO2: 99%   Information shared with Christen Butter, FNP  who instructed me to tell pt to change losartan to valsartan 160 mg daily, continue hctz as prescribed, continue to monitor BP at home and keep a log, and to schedule a f/u NV in 2 weeks. A new Rx for valsartan 160 mg to be taken daily sent to pts pharmacy for her.   Pt aware and verbalized understanding. Pt escorted to check out for scheduling.

## 2021-12-31 ENCOUNTER — Telehealth: Payer: Self-pay | Admitting: Cardiology

## 2021-12-31 NOTE — Telephone Encounter (Signed)
Spoke with patient, she sent a transmission.    Device has reached ERI.  Patient needs an appt to go over [employer].  She does not recall the procedure being discussed when she saw Dr. Elberta Fortis in September and she is really uncertain about the procedure.    Scheduled patient to see Otilio Saber on 01/08/22 at 10:20am.    Stressed to patient that the alert she received is not an emergency.  By virtue of sending transmission today, she should not receive the alert again.  However if she does, call us.  Also advised if she feels poorly she should still seek medical attention for her symptoms.

## 2021-12-31 NOTE — Telephone Encounter (Signed)
Patient called again to check on status.

## 2021-12-31 NOTE — Telephone Encounter (Signed)
°  1. Has your device fired? Yes, twice   2. Is you device beeping? no  3. Are you experiencing draining or swelling at device site? no  4. Are you calling to see if we received your device transmission? no  5. Have you passed out? no  Patient wants to know if she needs to go to the hospital.   Please route to Device Clinic Pool

## 2022-01-01 ENCOUNTER — Telehealth: Payer: Self-pay

## 2022-01-01 NOTE — Telephone Encounter (Signed)
[facility] alert received, device has reached ERI 1/9 Route to triage LA   Successful telephone encounter to patient to discuss ERI alert. Patient was called and scheduled for appt with Dr. Curt Bears to discuss gen change 01/08/22. All patient questions answered. Patient states she has not had any energy of late. Transmission reviewed and normal device function noted. CorVue trending down. Patient is alerted to volume overload. Discussed reduction in sodium intake and medications reviewed. Patient states she is taking her medications including HCTZ and lasix as prescribed however then asks if taking 1/2 pill of HCTZ would cause fluid retention. Patient states she is concerned that taking fluid pills is causing burning in her kidneys. Advised patient to discuss with PCP however needs to take fluid pills as prescribed. Patient thankful for call.

## 2022-01-07 NOTE — Progress Notes (Addendum)
Electrophysiology Office Note Date: 01/08/2022  ID:  Alice Stewart, DOB 07-Feb-1956, MRN [id number]  PCP: Christen Butter, NP Primary Cardiologist: Olga Millers, MD Electrophysiologist: Regan Lemming, MD   CC: Routine ICD follow-up  Alice Stewart is a 66 y.o. female seen today for Alice Jorja Loa, MD for acute visit due to device at [facility] .  Since last being seen in our clinic the patient reports doing poorly. She is visibly anxious in the office.  She has had SOB and is afraid to be alone since her device vibrated to alert to ERI. She has described tachypnea at times with a chest heaviness.  No edema or syncope. She has many other non-specific symptoms.   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 CHF  Past Medical History:  Diagnosis Date   Anemia    Back pain    Benign positional vertigo    Cardiomyopathy (HCC)    Carpal tunnel syndrome    Goiter    nontoxid mutinodular   Hypertension    Hyperthyroidism    Insulin resistance syndrome    Menopausal disorder    Menorrhagia    Migraine    Muscle spasm    trapezius   Neck pain, acute    Obesity    Seborrheic dermatitis    Thyroid disorder    Past Surgical History:  Procedure Laterality Date   DILATION AND CURETTAGE OF UTERUS  04/09   ENDOMETRIAL ABLATION  04/09   GREAT TOE ARTHRODESIS, METATARSALPHALANGEAL JOINT  6/07   Rt foot   HEEL SPUR SURGERY  11/87   Lt foot   TUBAL LIGATION  11/90    Current Outpatient Medications  Medication Sig Dispense Refill   Ascorbic Acid (VITAMIN C) 1000 MG tablet Take 1,000 mg by mouth daily.       aspirin 81 MG EC tablet Take by mouth.     Calcium 1500 MG tablet Take 1,500 mg by mouth.       flecainide (TAMBOCOR) 100 MG tablet Take 100 mg by mouth 2 (two) times daily.     furosemide (LASIX) 20 MG tablet TAKE ONE TABLET BY MOUTH EVERY DAY 90 tablet 0   hydrochlorothiazide (HYDRODIURIL) 50 MG tablet Take 1 tablet (50 mg total) by mouth daily.  90 tablet 0   Multiple Vitamin (MULTIVITAMIN) tablet Take 1 tablet by mouth daily.       potassium chloride SA (KLOR-CON) 20 MEQ tablet Take 40 mEq by mouth daily.     Red Clover Leaf Extract 500 MG TABS Take 1 tablet by mouth daily.     valsartan (DIOVAN) 160 MG tablet Take 1 tablet (160 mg total) by mouth daily. 30 tablet 0   VITAMIN D, ERGOCALCIFEROL, PO Take 1,000 mg by mouth daily.      No current facility-administered medications for this visit.    Allergies:   Bystolic [nebivolol hcl], Coreg [carvedilol], Estradiol, Amlodipine-atorvastatin, Chlorthalidone, Diltiazem hcl, Lisinopril, Metoprolol tartrate, and Simvastatin   Social History: Social History   Socioeconomic History   Marital status: Divorced    Spouse name: Not on file   Number of children: 3   Years of education: 14   Highest education level: Not on file  Occupational History   Occupation: unemployed    Employer: [id number]  Tobacco Use   Smoking status: Former    Types: Cigarettes    Quit date: 09/22/2004    Years since quitting: 17.3   Smokeless tobacco: Never  Vaping Use   Vaping Use: Never used  Substance and Sexual Activity   Alcohol use: Yes    Alcohol/week: 1.0 standard drink    Types: 1 Standard drinks or equivalent per week    Comment: 1 weekly   Drug use: No   Sexual activity: Yes    Birth control/protection: Post-menopausal  Other Topics Concern   Not on file  Social History Narrative   3-4 caffeinated drinks per day   No regular exercise   Social Determinants of Health   Financial Resource Strain: Low Risk    Difficulty of Paying Living Expenses: Not hard at all  Food Insecurity: No Food Insecurity   Worried About Programme researcher, broadcasting/film/video in the Last Year: Never true   Ran Out of Food in the Last Year: Never true  Transportation Needs: No Transportation Needs   Lack of Transportation (Medical): No   Lack of Transportation (Non-Medical): No  Physical Activity: Sufficiently Active   Days of  Exercise per Week: 5 days   Minutes of Exercise per Session: 30 min  Stress: No Stress Concern Present   Feeling of Stress : Not at all  Social Connections: Moderately Isolated   Frequency of Communication with Friends and Family: More than three times a week   Frequency of Social Gatherings with Friends and Family: More than three times a week   Attends Religious Services: 1 to 4 times per year   Active Member of [employer] or Organizations: No   Attends Engineer, structural: Never   Marital Status: Divorced  Catering manager Violence: Not At Risk   Fear of Current or Ex-Partner: No   Emotionally Abused: No   Physically Abused: No   Sexually Abused: No    Family History: Family History  Problem Relation Age of Onset   Other Mother        MS   Hypertension Father    Hypothyroidism Sister    Hypertension Sister    Diabetes Other     Review of Systems: All other systems reviewed and are otherwise negative except as noted above.   Physical Exam: Vitals:   01/08/22 1026  BP: (!) 184/84  Pulse: 66  SpO2: 98%  Weight: 249 lb (112.9 kg)  Height: 5\' 4"  (1.626 m)     GEN- The patient is well appearing, alert and oriented x 3 today.   HEENT: normocephalic, atraumatic; sclera clear, conjunctiva pink; hearing intact; oropharynx clear; neck supple, no JVP Lymph- no cervical lymphadenopathy Lungs- Clear to ausculation bilaterally, normal work of breathing.  No wheezes, rales, rhonchi Heart- Regular rate and rhythm, no murmurs, rubs or gallops, PMI not laterally displaced GI- soft, non-tender, non-distended, bowel sounds present, no hepatosplenomegaly Extremities- no clubbing or cyanosis. No edema; DP/PT/radial pulses 2+ bilaterally MS- no significant deformity or atrophy Skin- warm and dry, no rash or lesion; ICD pocket well healed Psych- euthymic mood, full affect Neuro- strength and sensation are intact  ICD interrogation- reviewed in detail today,  See PACEART  report  EKG:  EKG is ordered today. Personal review of EKG ordered today shows AS/BiV pacing at 66 bpm  Recent Labs: No results found for requested labs within last 8760 hours.   Wt Readings from Last 3 Encounters:  01/08/22 249 lb (112.9 kg)  12/14/21 241 lb 1.3 oz (109.4 kg)  12/10/21 245 lb (111.1 kg)     Other studies Reviewed: Additional studies/ records that were reviewed today include: Previous EP office notes.   Assessment  and Plan:  1.  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 No indication to repeat.  2. PVCs Continue flecainide 100 mg BID EKG today shows stable intervals from previous.  3. HTN Elevated today with severe anxiety in office.  She wishes to continue to follow at home as well as post gen change.  She has numerous medication intolerances.  Reassurance was given multiple times that her device function has not change due to meeting ERI status.  Current medicines are reviewed at length with the patient today.    Labs/ tests ordered today include:  Orders Placed This Encounter  Procedures   Basic metabolic panel   CBC   CUP PACEART INCLINIC DEVICE CHECK   EKG 12-Lead   Disposition:   Follow up with Dr. Elberta Fortis in  as usual post gen change.    Dustin Flock, PA-C  01/08/2022 11:03 AM  [facility] [address] [place] [address] [phone] (office) [phone] (fax)

## 2022-01-08 ENCOUNTER — Ambulatory Visit (INDEPENDENT_AMBULATORY_CARE_PROVIDER_SITE_OTHER): Payer: 59 | Admitting: Student

## 2022-01-08 ENCOUNTER — Encounter: Payer: Self-pay | Admitting: Student

## 2022-01-08 ENCOUNTER — Other Ambulatory Visit: Payer: Self-pay

## 2022-01-08 VITALS — BP 184/84 | HR 66 | Ht 64.0 in | Wt 249.0 lb

## 2022-01-08 DIAGNOSIS — I5022 Chronic systolic (congestive) heart failure: Secondary | ICD-10-CM | POA: Insufficient documentation

## 2022-01-08 DIAGNOSIS — I429 Cardiomyopathy, unspecified: Secondary | ICD-10-CM

## 2022-01-08 DIAGNOSIS — I10 Essential (primary) hypertension: Secondary | ICD-10-CM

## 2022-01-08 DIAGNOSIS — I493 Ventricular premature depolarization: Secondary | ICD-10-CM

## 2022-01-08 DIAGNOSIS — I5042 Chronic combined systolic (congestive) and diastolic (congestive) heart failure: Secondary | ICD-10-CM | POA: Insufficient documentation

## 2022-01-08 LAB — CUP PACEART INCLINIC DEVICE CHECK
Battery Remaining Longevity: 0 mo
Brady Statistic RA Percent Paced: 19 %
Brady Statistic RV Percent Paced: 99.97 %
Date Time Interrogation Session: 20230117105110
HighPow Impedance: 70.875
Implantable Lead Implant Date: 20150303
Implantable Lead Implant Date: 20150303
Implantable Lead Implant Date: 20150303
Implantable Lead Location: 753858
Implantable Lead Location: 753859
Implantable Lead Location: 753860
Implantable Pulse Generator Implant Date: 20150303
Lead Channel Impedance Value: 412.5 Ohm
Lead Channel Impedance Value: 575 Ohm
Lead Channel Impedance Value: 637.5 Ohm
Lead Channel Pacing Threshold Amplitude: 0.875 V
Lead Channel Pacing Threshold Amplitude: 1 V
Lead Channel Pacing Threshold Amplitude: 2.25 V
Lead Channel Pacing Threshold Amplitude: 2.25 V
Lead Channel Pacing Threshold Pulse Width: 0.5 ms
Lead Channel Pacing Threshold Pulse Width: 0.5 ms
Lead Channel Pacing Threshold Pulse Width: 0.5 ms
Lead Channel Pacing Threshold Pulse Width: 0.5 ms
Lead Channel Sensing Intrinsic Amplitude: 12 mV
Lead Channel Sensing Intrinsic Amplitude: 4.6 mV
Lead Channel Setting Pacing Amplitude: 1.875
Lead Channel Setting Pacing Amplitude: 2 V
Lead Channel Setting Pacing Amplitude: 3 V
Lead Channel Setting Pacing Pulse Width: 0.5 ms
Lead Channel Setting Pacing Pulse Width: 0.5 ms
Lead Channel Setting Sensing Sensitivity: 0.5 mV
Pulse Gen Serial Number: 7153470

## 2022-01-08 LAB — BASIC METABOLIC PANEL
BUN/Creatinine Ratio: 19 (ref 12–28)
BUN: 20 mg/dL (ref 8–27)
CO2: 30 mmol/L — ABNORMAL HIGH (ref 20–29)
Calcium: 10.4 mg/dL — ABNORMAL HIGH (ref 8.7–10.3)
Chloride: 101 mmol/L (ref 96–106)
Creatinine, Ser: 1.04 mg/dL — ABNORMAL HIGH (ref 0.57–1.00)
Glucose: 110 mg/dL — ABNORMAL HIGH (ref 70–99)
Potassium: 3.6 mmol/L (ref 3.5–5.2)
Sodium: 144 mmol/L (ref 134–144)
eGFR: 60 mL/min/{1.73_m2} (ref >59)

## 2022-01-08 LAB — CBC
Hematocrit: 43 % (ref 34.0–46.6)
Hemoglobin: 14.6 g/dL (ref 11.1–15.9)
MCH: 32.1 pg (ref 26.6–33.0)
MCHC: 34 g/dL (ref 31.5–35.7)
MCV: 95 fL (ref 79–97)
Platelets: 333 10*3/uL (ref 150–450)
RBC: 4.55 x10E6/uL (ref 3.77–5.28)
RDW: 12.1 % (ref 11.7–15.4)
WBC: 7 10*3/uL (ref 3.4–10.8)

## 2022-01-08 NOTE — Patient Instructions (Signed)
Medication Instructions: Your physician recommends that you continue on your current medications as directed. Please refer to the Current Medication list given to you today.  Labwork: Your physician has recommended that you have lab work today: BMET and CBC  Procedures/Testing: Your physician has recommended that you have a Generator Change of your device on 01/18/2022. This is a procedure that replaces a Pacemaker ICD generator that is at the end of its service life. The remaining lifespan of a pacemaker is determined during visits to the Device Clinic. The battery in a pacemaker does not stop suddenly but rather loses its charge slowly, which lets the cardiologist plan the replacement date.  Follow-Up: Your physician recommends that you schedule a follow-up appointment in 10 - 14 days from 01/18/2022 with the Device clinic for a wound check  Your physician recommends that you schedule a follow-up appointment in 3 months from 01/18/2022 with Dr. Elberta Fortis.   If you need a refill on your cardiac medications before your next appointment, please call your pharmacy.   -------------------------------------------------------------------------------------------------------------  Please wash with the CHG Soap the night before and morning of procedure (follow instruction page "Preparing For Surgery").   Please report to the Main Entrance [facility] at 11:30am ([address] [place], [place] [address])  DO NOT eat or drink anything after midnight the night before procedure  You may take all of your morning medications the day of your procedure EXCEPT Furosemide with enough water to get them down safely.  You will need someone to drive you home after the procedure  --------------------------------------------------------------------------------------------------------------  [facility] - Preparing For Surgery  Before surgery, you can play an important  role. Because skin is not sterile, your skin needs to be as free of germs as possible. You can reduce the number of germs on your skin by washing with CHG (chlorahexidine gluconate) Soap before surgery.  CHG is an antiseptic cleaner which kills germs and bonds with the skin to continue killing germs even after washing.   Please do not use if you have an allergy to CHG or antibacterial soaps.  If your skin becomes reddened/irritated stop using the CHG.   Do not shave (including legs and underarms) for at least 48 hours prior to first CHG shower.  It is OK to shave your face.  Please follow these instructions carefully:  1.  Shower the night before surgery and the morning of surgery with CHG.  2.  If you choose to wash your hair, wash your hair first as usual with your normal shampoo.  3.  After you shampoo, rinse your hair and body thoroughly to remove the shampoo.  4.  Use CHG as you would any other liquid soap.  You can apply CHG directly to the skin and wash gently with a clean washcloth. 5.  Apply the CHG Soap to your body ONLY FROM THE NECK DOWN.  Do not use on open wounds or open sores.  Avoid contact with your eyes, ears, mouth and genitals (private parts).  Wash genitals (private parts) with your normal soap.  6.  Wash thoroughly, paying special attention to the area where your surgery will be performed.  7.  Thoroughly rinse your body with warm water from the neck down.   8.  DO NOT shower/wash with your normal soap after using and rinsing off the CHG soap.  9.  Pat yourself dry with a clean towel.  10.  Wear clean pajamas.           11.  Place clean sheets on your bed the night of your first shower and do not sleep with pets.  Day of Surgery: Do not apply any deodorants/lotions.  Please wear clean clothes to the hospital/surgery center.

## 2022-01-11 ENCOUNTER — Ambulatory Visit (INDEPENDENT_AMBULATORY_CARE_PROVIDER_SITE_OTHER): Payer: 59 | Admitting: Medical-Surgical

## 2022-01-11 ENCOUNTER — Other Ambulatory Visit: Payer: Self-pay

## 2022-01-11 VITALS — BP 142/75 | HR 61 | Ht 64.0 in | Wt 249.0 lb

## 2022-01-11 DIAGNOSIS — I10 Essential (primary) hypertension: Secondary | ICD-10-CM | POA: Diagnosis not present

## 2022-01-11 NOTE — Progress Notes (Signed)
Patient is here for blood pressure check.   Previous BP was 184/84 on 01/08/22  1st BP today: 143/76  2nd BP today (after 10  minutes): 142/75  Some dizziness and fatigue due to pacemaker needing battery change (per pt). Procedure scheduled for Friday, 01/18/22  Taking medication as prescribed. Denies missed doses.  Per Larinda Buttery, pt to follow-up in 3 months following battery replacement procedure.

## 2022-01-11 NOTE — Progress Notes (Signed)
Agree with documentation as above.  ? ?___________________________________________ ?Alice Stewart L. Kalina Morabito, DNP, APRN, FNP-BC ?Primary Care and Sports Medicine ?[facility] ? ?

## 2022-01-16 ENCOUNTER — Telehealth: Payer: Self-pay | Admitting: *Deleted

## 2022-01-16 NOTE — Telephone Encounter (Signed)
Pt aware update to time to arrive to procedure on Friday. Aware to arrive at 8:30 am  on Friday for her procedure. Patient verbalized understanding and agreeable to plan.

## 2022-01-17 ENCOUNTER — Other Ambulatory Visit: Payer: Self-pay | Admitting: Medical-Surgical

## 2022-01-17 DIAGNOSIS — I10 Essential (primary) hypertension: Secondary | ICD-10-CM

## 2022-01-17 NOTE — Pre-Procedure Instructions (Signed)
Attempted to call patient regarding procedure instructions.  Left voicemail on the following items: Arrival time 1030 Nothing to eat or drink after midnight No meds AM of procedure Responsible person to drive you home and stay with you for 24 hrs Wash with special soap night before and morning of procedure  

## 2022-01-17 NOTE — Telephone Encounter (Signed)
Pt updated that procedure time has moved again. Aware to arrive at 10:30 am tomorrow for her procedure. Patient verbalized understanding and agreeable to plan.

## 2022-01-18 ENCOUNTER — Ambulatory Visit (HOSPITAL_COMMUNITY)
Admission: RE | Admit: 2022-01-18 | Discharge: 2022-01-18 | Disposition: A | Discharge status: Home / Self Care | Payer: 59 | Attending: Cardiology | Admitting: Cardiology

## 2022-01-18 ENCOUNTER — Encounter (HOSPITAL_COMMUNITY)
Admission: RE | Disposition: A | Discharge status: Home / Self Care | Payer: Self-pay | Source: Home / Self Care | Attending: Cardiology

## 2022-01-18 ENCOUNTER — Other Ambulatory Visit: Payer: Self-pay

## 2022-01-18 DIAGNOSIS — Z4502 Encounter for adjustment and management of automatic implantable cardiac defibrillator: Secondary | ICD-10-CM | POA: Insufficient documentation

## 2022-01-18 DIAGNOSIS — I428 Other cardiomyopathies: Secondary | ICD-10-CM | POA: Diagnosis not present

## 2022-01-18 DIAGNOSIS — Z79899 Other long term (current) drug therapy: Secondary | ICD-10-CM | POA: Diagnosis not present

## 2022-01-18 DIAGNOSIS — I2510 Atherosclerotic heart disease of native coronary artery without angina pectoris: Secondary | ICD-10-CM | POA: Insufficient documentation

## 2022-01-18 DIAGNOSIS — I5022 Chronic systolic (congestive) heart failure: Secondary | ICD-10-CM | POA: Diagnosis not present

## 2022-01-18 DIAGNOSIS — Z87891 Personal history of nicotine dependence: Secondary | ICD-10-CM | POA: Diagnosis not present

## 2022-01-18 DIAGNOSIS — I5042 Chronic combined systolic (congestive) and diastolic (congestive) heart failure: Secondary | ICD-10-CM | POA: Insufficient documentation

## 2022-01-18 DIAGNOSIS — I493 Ventricular premature depolarization: Secondary | ICD-10-CM | POA: Diagnosis not present

## 2022-01-18 DIAGNOSIS — I110 Hypertensive heart disease with heart failure: Secondary | ICD-10-CM | POA: Insufficient documentation

## 2022-01-18 HISTORY — PX: ICD GENERATOR CHANGEOUT: EP1231

## 2022-01-18 SURGERY — ICD GENERATOR CHANGEOUT

## 2022-01-18 MED ORDER — FENTANYL CITRATE (PF) 100 MCG/2ML IJ SOLN
INTRAMUSCULAR | Status: DC | PRN
  Administered 2022-01-18: 25 ug via INTRAVENOUS
  Administered 2022-01-18: 50 ug via INTRAVENOUS
  Administered 2022-01-18: 25 ug via INTRAVENOUS

## 2022-01-18 MED ORDER — LIDOCAINE HCL (PF) 1 % IJ SOLN
INTRAMUSCULAR | Status: DC | PRN
  Administered 2022-01-18: 60 mL

## 2022-01-18 MED ORDER — SODIUM CHLORIDE 0.9 % IV SOLN: INTRAVENOUS | Status: DC

## 2022-01-18 MED ORDER — LIDOCAINE HCL (PF) 1 % IJ SOLN
INTRAMUSCULAR | Status: AC
  Filled 2022-01-18: qty 60

## 2022-01-18 MED ORDER — ACETAMINOPHEN 325 MG PO TABS: 325.0000 mg | ORAL_TABLET | ORAL | Status: DC | PRN

## 2022-01-18 MED ORDER — SODIUM CHLORIDE 0.9 % IV SOLN
80.0000 mg | INTRAVENOUS | Status: AC
  Administered 2022-01-18: 80 mg

## 2022-01-18 MED ORDER — CHLORHEXIDINE GLUCONATE 4 % EX LIQD: 4.0000 "application " | Freq: Once | CUTANEOUS | Status: DC

## 2022-01-18 MED ORDER — CEFAZOLIN SODIUM-DEXTROSE 2-4 GM/100ML-% IV SOLN
INTRAVENOUS | Status: AC
  Filled 2022-01-18: qty 100

## 2022-01-18 MED ORDER — ONDANSETRON HCL 4 MG/2ML IJ SOLN: 4.0000 mg | Freq: Four times a day (QID) | INTRAMUSCULAR | Status: DC | PRN

## 2022-01-18 MED ORDER — FENTANYL CITRATE (PF) 100 MCG/2ML IJ SOLN
INTRAMUSCULAR | Status: AC
  Filled 2022-01-18: qty 2

## 2022-01-18 MED ORDER — CEFAZOLIN SODIUM-DEXTROSE 2-4 GM/100ML-% IV SOLN
2.0000 g | INTRAVENOUS | Status: AC
  Administered 2022-01-18: 2 g via INTRAVENOUS

## 2022-01-18 MED ORDER — MIDAZOLAM HCL 5 MG/5ML IJ SOLN
INTRAMUSCULAR | Status: DC | PRN
  Administered 2022-01-18: 2 mg via INTRAVENOUS
  Administered 2022-01-18 (×2): 1 mg via INTRAVENOUS

## 2022-01-18 MED ORDER — SODIUM CHLORIDE 0.9 % IV SOLN
INTRAVENOUS | Status: AC
  Filled 2022-01-18: qty 2

## 2022-01-18 MED ORDER — MIDAZOLAM HCL 5 MG/5ML IJ SOLN
INTRAMUSCULAR | Status: AC
  Filled 2022-01-18: qty 5

## 2022-01-18 SURGICAL SUPPLY — 4 items
CABLE SURGICAL S-101-97-12 (CABLE) ×2 IMPLANT
ICD GALLANT HFCRTD CDHFA500Q (ICD Generator) ×1 IMPLANT
PAD DEFIB RADIO PHYSIO CONN (PAD) ×2 IMPLANT
TRAY PACEMAKER INSERTION (PACKS) ×2 IMPLANT

## 2022-01-18 NOTE — Discharge Instructions (Signed)
Implantable Cardiac Device Battery Change, Care After  This sheet gives you information about how to care for yourself after your procedure. Your health care provider may also give you more specific instructions. If you have problems or questions, contact your health care provider. What can I expect after the procedure? After your procedure, it is common to have: Pain or soreness at the site where the cardiac device was inserted. Swelling at the site where the cardiac device was inserted. You should received an information card for your new device in 4-8 weeks. Follow these instructions at home: Incision care  Keep the incision clean and dry. Do not take baths, swim, or use a hot tub until after your wound check.  Do not shower for at least 7 days, or as directed by your health care provider. Pat the area dry with a clean towel. Do not rub the area. This may cause bleeding. Follow instructions from your health care provider about how to take care of your incision. Make sure you: Leave stitches (sutures), skin glue, or adhesive strips in place. These skin closures may need to stay in place for 2 weeks or longer. If adhesive strip edges start to loosen and curl up, you may trim the loose edges. Do not remove adhesive strips completely unless your health care provider tells you to do that. Check your incision area every day for signs of infection. Check for: More redness, swelling, or pain. More fluid or blood. Warmth. Pus or a bad smell. Activity Do not lift anything that is heavier than 10 lb (4.5 kg) until your health care provider says it is okay to do so. 1 week For the first week, or as long as told by your health care provider: Avoid lifting your affected arm higher than your shoulder. After 1 week, Be gentle when you move your arms over your head. It is okay to raise your arm to comb your hair. Avoid strenuous exercise. Ask your health care provider when it is okay to: Resume your  normal activities. Return to work or school. Resume sexual activity. Eating and drinking Eat a heart-healthy diet. This should include plenty of fresh fruits and vegetables, whole grains, low-fat dairy products, and lean protein like chicken and fish. Limit alcohol intake to no more than 1 drink a day for non-pregnant women and 2 drinks a day for men. One drink equals 12 oz of beer, 5 oz of wine, or 1 oz of hard liquor. Check ingredients and nutrition facts on packaged foods and beverages. Avoid the following types of food: Food that is high in salt (sodium). Food that is high in saturated fat, like full-fat dairy or red meat. Food that is high in trans fat, like fried food. Food and drinks that are high in sugar. Lifestyle Do not use any products that contain nicotine or tobacco, such as cigarettes and e-cigarettes. If you need help quitting, ask your health care provider. Take steps to manage and control your weight. Once cleared, get regular exercise. Aim for 150 minutes of moderate-intensity exercise (such as walking or yoga) or 75 minutes of vigorous exercise (such as running or swimming) each week. Manage other health problems, such as diabetes or high blood pressure. Ask your health care provider how you can manage these conditions. General instructions Do not drive for 24 hours after your procedure if you were given a medicine to help you relax (sedative). Take over-the-counter and prescription medicines only as told by your health care provider. Avoid  putting pressure on the area where the cardiac device was placed. If you need an MRI after your cardiac device has been placed, be sure to tell the health care provider who orders the MRI that you have a cardiac device. Avoid close and prolonged exposure to electrical devices that have strong magnetic fields. These include: Cell phones. Avoid keeping them in a pocket near the cardiac device, and try using the ear opposite the cardiac  device. MP3 players. Household appliances, like microwaves. Metal detectors. Electric generators. High-tension wires. Keep all follow-up visits as directed by your health care provider. This is important. Contact a health care provider if: You have pain at the incision site that is not relieved by over-the-counter or prescription medicines. You have any of these around your incision site or coming from it: More redness, swelling, or pain. Fluid or blood. Warmth to the touch. Pus or a bad smell. You have a fever. You feel brief, occasional palpitations, light-headedness, or any symptoms that you think might be related to your heart. Get help right away if: You experience chest pain that is different from the pain at the cardiac device site. You develop a red streak that extends above or below the incision site. You experience shortness of breath. You have palpitations or an irregular heartbeat. You have light-headedness that does not go away quickly. You faint or have dizzy spells. Your pulse suddenly drops or increases rapidly and does not return to normal. You begin to gain weight and your legs and ankles swell. Summary After your procedure, it is common to have pain, soreness, and some swelling where the cardiac device was inserted. Make sure to keep your incision clean and dry. Follow instructions from your health care provider about how to take care of your incision. Check your incision every day for signs of infection, such as more pain or swelling, pus or a bad smell, warmth, or leaking fluid and blood. Avoid strenuous exercise and lifting your left arm higher than your shoulder for 2 weeks, or as long as told by your health care provider. This information is not intended to replace advice given to you by your health care provider. Make sure you discuss any questions you have with your health care provider.

## 2022-01-18 NOTE — H&P (Signed)
Electrophysiology Office Note   Date:  01/18/2022   ID:  Alice Stewart, DOB 1956/09/14, MRN [id number]  PCP:  Samuel Bouche, NP  Cardiologist:  Stanford Breed Primary Electrophysiologist:  Shanzay Hepworth Meredith Leeds, MD    Chief Complaint: ICD   History of Present Illness: Alice Stewart is a 66 y.o. female who is being seen today for the evaluation of ICD at the request of No ref. provider found. Presenting today for electrophysiology evaluation.  She has a history of systolic heart failure with left bundle branch block.  She also has outflow tract PVCs.  She had an ablation attempt, though this was unsuccessful.  She is status post [place] CRT-D implanted in 2015.  CTA July 2021 shows minimal nonobstructive coronary artery disease with a calcium score of 34.  Echo 2021 showed an ejection fraction of 55 to 60%.  Her PVCs are currently treated with flecainide.  The patient recently moved to the area from [place] state.  Today, she denies symptoms of palpitations, chest pain, shortness of breath, orthopnea, PND, lower extremity edema, claudication, dizziness, presyncope, syncope, bleeding, or neurologic sequela. The patient is tolerating medications without difficulties.    Past Medical History:  Diagnosis Date   Anemia    Back pain    Benign positional vertigo    Cardiomyopathy ([place])    Carpal tunnel syndrome    Goiter    nontoxid mutinodular   Hypertension    Hyperthyroidism    Insulin resistance syndrome    Menopausal disorder    Menorrhagia    Migraine    Muscle spasm    trapezius   Neck pain, acute    Obesity    Seborrheic dermatitis    Thyroid disorder    Past Surgical History:  Procedure Laterality Date   DILATION AND CURETTAGE OF UTERUS  04/09   ENDOMETRIAL ABLATION  04/09   GREAT TOE ARTHRODESIS, METATARSALPHALANGEAL JOINT  6/07   Rt foot   HEEL SPUR SURGERY  11/87   Lt foot   TUBAL LIGATION  11/90     Current Facility-Administered Medications  Medication  Dose Route Frequency Provider Last Rate Last Admin   0.9 %  sodium chloride infusion   Intravenous Continuous Shirley Friar, PA-C       ceFAZolin (ANCEF) IVPB 2g/100 mL premix  2 g Intravenous On Call Shirley Friar, PA-C       chlorhexidine (HIBICLENS) 4 % liquid 4 application  4 application Topical Once Shirley Friar, PA-C       gentamicin (GARAMYCIN) 80 mg in sodium chloride 0.9 % 500 mL irrigation  80 mg Irrigation On Call Shirley Friar, PA-C        Allergies:   Thayer Jew hcl], Coreg [carvedilol], Estradiol, Amlodipine-atorvastatin, Chlorthalidone, Diltiazem hcl, Lisinopril, Metoprolol tartrate, and Simvastatin   Social History:  The patient  reports that she quit smoking about 17 years ago. Her smoking use included cigarettes. She has never used smokeless tobacco. She reports current alcohol use of about 1.0 standard drink per week. She reports that she does not use drugs.   Family History:  The patient's family history includes Diabetes in an other family member; Hypertension in her father and sister; Hypothyroidism in her sister; Other in her mother.    ROS:  Please see the history of present illness.   Otherwise, review of systems is positive for none.   All other systems are reviewed and negative.    PHYSICAL EXAM: VS:  BP Marland Kitchen)  191/72 (BP Location: Right Arm)    Pulse 65    Temp 98 F (36.7 C) (Oral)    Resp 15    Ht 5\' 4"  (1.626 m)    Wt 111.6 kg    LMP 09/27/2011    SpO2 100%    BMI 42.23 kg/m  , BMI Body mass index is 42.23 kg/m. GEN: Well nourished, well developed, in no acute distress  HEENT: normal  Neck: no JVD, carotid bruits, or masses Cardiac: RRR; no murmurs, rubs, or gallops,no edema  Respiratory:  clear to auscultation bilaterally, normal work of breathing GI: soft, nontender, nondistended, + BS MS: no deformity or atrophy  Skin: warm and dry, device pocket is well healed Neuro:  Strength and sensation are  intact Psych: euthymic mood, full affect  EKG:  EKG is not ordered today. Personal review of the ekg ordered 08/20/21 shows sinus rhythm, ventricular paced  Device interrogation is reviewed today in detail.  See PaceArt for details.   Recent Labs: 01/08/2022: BUN 20; Creatinine, Ser 1.04; Hemoglobin 14.6; Platelets 333; Potassium 3.6; Sodium 144    Lipid Panel     Component Value Date/Time   CHOL 194 03/11/2007 1943   TRIG 110 03/11/2007 1943   HDL 56 03/11/2007 1943   CHOLHDL 3.5 Ratio 03/11/2007 1943   VLDL 22 03/11/2007 1943   LDLCALC 116 (H) 03/11/2007 1943     Wt Readings from Last 3 Encounters:  01/18/22 111.6 kg  01/11/22 112.9 kg  01/08/22 112.9 kg      Other studies Reviewed: Additional studies/ records that were reviewed today include: TTE 09/03/21  Review of the above records today demonstrates:   1. Left ventricular ejection fraction, by estimation, is 45 to 50%. The  left ventricle has mildly decreased function. The left ventricle has no  regional wall motion abnormalities. There is moderate left ventricular  hypertrophy. Left ventricular  diastolic parameters are consistent with Grade I diastolic dysfunction  (impaired relaxation).   2. Right ventricular systolic function is normal. The right ventricular  size is normal.   3. The mitral valve is normal in structure. Mild mitral valve  regurgitation. No evidence of mitral stenosis.   4. The aortic valve is normal in structure. Aortic valve regurgitation is  mild. No aortic stenosis is present.   5. The inferior vena cava is normal in size with greater than 50%  respiratory variability, suggesting right atrial pressure of 3 mmHg.   ASSESSMENT AND PLAN:  1.  History of nonischemic cardiomyopathy:  ICD Criteria  Current LVEF:40-45%. Within 12 months prior to implant: Yes   Heart failure history: Yes, Class II  Cardiomyopathy history: Yes, Non-Ischemic Cardiomyopathy.  Atrial Fibrillation/Atrial  Flutter: No.  Ventricular tachycardia history: No.  Cardiac arrest history: No.  History of syndromes with risk of sudden death: No.  Previous ICD: Yes, Reason for ICD:  Primary prevention.  Current ICD indication: Primary  PPM indication: No.  Class I or II Bradycardia indication present: No  Beta Blocker therapy for 3 or more months: Yes, prescribed.   Ace Inhibitor/ARB therapy for 3 or more months: Yes, prescribed.    I have seen Alice Stewart is a 66 y.o. femalepre-procedural and has been referred by [facility] for consideration of ICD implant for primary prevention of sudden death.  The patient's chart has been reviewed and they meet criteria for ICD implant.  I have had a thorough discussion with the patient reviewing options.  The patient and their family (if  available) have had opportunities to ask questions and have them answered. The patient and I have decided together through the [employer] to implant ICD at this time.  Risks, benefits, alternatives to ICD implantation were discussed in detail with the patient today. The patient  understands that the risks include but are not limited to bleeding, infection, pneumothorax, perforation, tamponade, vascular damage, renal failure, MI, stroke, death, inappropriate shocks, and lead dislodgement and wishes to proceed.

## 2022-01-21 ENCOUNTER — Encounter (HOSPITAL_COMMUNITY): Payer: Self-pay | Admitting: Cardiology

## 2022-01-22 NOTE — Progress Notes (Signed)
HPI: FU CM.  Previously followed by Marshall Cork, MD in [place].  She has a history of echocardiogram in 2014 showed ejection fraction 25 to 30% with left bundle branch block.  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  She had CRT-D in 2015.  CTA in July 2021 showed minimal nonobstructive coronary disease with calcium score 34.  Echocardiogram repeated May 2021 showed ejection fraction 55 to 60%.  Her PVCs are now treated with flecainide.  Based on last office visit note from March 2022 Holter monitor and follow-up echocardiogram recommended at time of evaluation in [place].  Patient previously moved back to this area from [place] state.  Follow-up echocardiogram here September 2022 showed ejection fraction 45 to 50%, moderate left ventricular hypertrophy, grade 1 diastolic dysfunction, mild mitral regurgitation.  Patient had ICD generator change January 2023.  Since last seen she has mild DOE; no CP or syncope  Current Outpatient Medications  Medication Sig Dispense Refill   Ascorbic Acid (VITAMIN C) 1000 MG tablet Take 1,000 mg by mouth daily.       aspirin 81 MG EC tablet Take 81 mg by mouth daily.     Bioflavonoid Products (BIOFLEX PO) Take 2 tablets by mouth daily.     Biotin 10000 MCG TABS Take 10,000 mcg by mouth daily.     Calcium Carb-Cholecalciferol 600-20 MG-MCG TABS Take 1 tablet by mouth daily.     COLLAGEN PO Take 3 capsules by mouth daily.     ferrous sulfate 325 (65 FE) MG tablet Take 325 mg by mouth daily with breakfast.     flecainide (TAMBOCOR) 100 MG tablet Take 100 mg by mouth 2 (two) times daily.     furosemide (LASIX) 20 MG tablet TAKE ONE TABLET BY MOUTH EVERY DAY 90 tablet 0   hydrochlorothiazide (HYDRODIURIL) 50 MG tablet Take 1 tablet (50 mg total) by mouth daily. 90 tablet 0   losartan (COZAAR) 100 MG tablet Take 100 mg by mouth daily.     Multiple Minerals-Vitamins (GNP CAL MAG ZINC +D3 PO) Take 3 tablets by  mouth daily.     Multiple Vitamin (MULTIVITAMIN) tablet Take 1 tablet by mouth daily.       Red Clover Leaf Extract 500 MG TABS Take 500 mg by mouth daily.     vitamin B-12 (CYANOCOBALAMIN) 1000 MCG tablet Take 1,000 mcg by mouth daily.     vitamin E 180 MG (400 UNITS) capsule Take 400 Units by mouth daily.     potassium chloride SA (KLOR-CON) 20 MEQ tablet Take 40 mEq by mouth daily. (Patient not taking: Reported on 02/04/2022)     valsartan (DIOVAN) 160 MG tablet Take 1 tablet (160 mg total) by mouth daily. (Patient not taking: Reported on 02/04/2022) 30 tablet 0   No current facility-administered medications for this visit.     Past Medical History:  Diagnosis Date   Anemia    Back pain    Benign positional vertigo    Cardiomyopathy ([place])    Carpal tunnel syndrome    Goiter    nontoxid mutinodular   Hypertension    Hyperthyroidism    Insulin resistance syndrome    Menopausal disorder    Menorrhagia    Migraine    Muscle spasm    trapezius   Neck pain, acute    Obesity    Seborrheic dermatitis    Thyroid disorder     Past Surgical History:  Procedure Laterality Date   DILATION AND CURETTAGE OF UTERUS  04/09   ENDOMETRIAL ABLATION  04/09   GREAT TOE ARTHRODESIS, METATARSALPHALANGEAL JOINT  6/07   Rt foot   HEEL SPUR SURGERY  11/87   Lt foot   ICD GENERATOR CHANGEOUT N/A 01/18/2022   Procedure: ICD GENERATOR CHANGEOUT;  Surgeon: Constance Haw, MD;  Location: [facility];  Service: Cardiovascular;  Laterality: N/A;   TUBAL LIGATION  11/90    Social History   Socioeconomic History   Marital status: Divorced    Spouse name: Not on file   Number of children: 3   Years of education: 14   Highest education level: Not on file  Occupational History   Occupation: unemployed    Employer: [id number]  Tobacco Use   Smoking status: Former    Types: Cigarettes    Quit date: 09/22/2004    Years since quitting: 17.3   Smokeless tobacco: Never  Vaping Use    Vaping Use: Never used  Substance and Sexual Activity   Alcohol use: Yes    Alcohol/week: 1.0 standard drink    Types: 1 Standard drinks or equivalent per week    Comment: 1 weekly   Drug use: No   Sexual activity: Yes    Birth control/protection: Post-menopausal  Other Topics Concern   Not on file  Social History Narrative   3-4 caffeinated drinks per day   No regular exercise   Social Determinants of Health   Financial Resource Strain: Low Risk    Difficulty of Paying Living Expenses: Not hard at all  Food Insecurity: No Food Insecurity   Worried About Charity fundraiser in the Last Year: Never true   Ran Out of Food in the Last Year: Never true  Transportation Needs: No Transportation Needs   Lack of Transportation (Medical): No   Lack of Transportation (Non-Medical): No  Physical Activity: Sufficiently Active   Days of Exercise per Week: 5 days   Minutes of Exercise per Session: 30 min  Stress: No Stress Concern Present   Feeling of Stress : Not at all  Social Connections: Moderately Isolated   Frequency of Communication with Friends and Family: More than three times a week   Frequency of Social Gatherings with Friends and Family: More than three times a week   Attends Religious Services: 1 to 4 times per year   Active Member of [employer] or Organizations: No   Attends Music therapist: Never   Marital Status: Divorced  Human resources officer Violence: Not At Risk   Fear of Current or Ex-Partner: No   Emotionally Abused: No   Physically Abused: No   Sexually Abused: No    Family History  Problem Relation Age of Onset   Other Mother        MS   Hypertension Father    Hypothyroidism Sister    Hypertension Sister    Diabetes Other     ROS: no fevers or chills, productive cough, hemoptysis, dysphasia, odynophagia, melena, hematochezia, dysuria, hematuria, rash, seizure activity, orthopnea, PND, pedal edema, claudication. Remaining systems are  negative.  Physical Exam: Well-developed well-nourished in no acute distress.  Skin is warm and dry.  HEENT is normal.  Neck is supple.  Chest is clear to auscultation with normal expansion. S/p generator change with no hematoma.  Cardiovascular exam is regular rate and rhythm.  Abdominal exam nontender or distended. No masses palpated. Extremities show no edema. neuro grossly intact  A/P  1 nonischemic cardiomyopathy-LV function mildly reduced on most recent echocardiogram.  Pt states she did not tolerate valsartan, coreg, toprol. BP elevated; will try entresto 24/26 BID to see if she tolerates.   2 history of mildly elevated calcium score-will check lipids; intolerant to statins; add zetia if LDL elevated.   3 hyperlipidemia-patient is intolerant to statins due to myalgias.  4 hypertension-blood pressure controlled.  Continue present medical regimen.  5 history of CRT-D-follow-up electrophysiology.  6 history of PVCs-we will continue flecainide.  7 hypertension-BP elevated; will try entresto as outlined above.  Kirk Ruths, MD

## 2022-01-30 ENCOUNTER — Ambulatory Visit: Payer: 59

## 2022-01-31 ENCOUNTER — Ambulatory Visit (INDEPENDENT_AMBULATORY_CARE_PROVIDER_SITE_OTHER): Payer: 59

## 2022-01-31 ENCOUNTER — Other Ambulatory Visit: Payer: Self-pay

## 2022-01-31 DIAGNOSIS — I5022 Chronic systolic (congestive) heart failure: Secondary | ICD-10-CM | POA: Diagnosis not present

## 2022-01-31 LAB — CUP PACEART INCLINIC DEVICE CHECK
Battery Remaining Longevity: 91 mo
Brady Statistic RA Percent Paced: 23 %
Brady Statistic RV Percent Paced: 99.99 %
Date Time Interrogation Session: 20230209170830
HighPow Impedance: 64.125
Implantable Lead Implant Date: 20150303
Implantable Lead Implant Date: 20150303
Implantable Lead Implant Date: 20150303
Implantable Lead Location: 753858
Implantable Lead Location: 753859
Implantable Lead Location: 753860
Implantable Pulse Generator Implant Date: 20230127
Lead Channel Impedance Value: 437.5 Ohm
Lead Channel Impedance Value: 537.5 Ohm
Lead Channel Impedance Value: 612.5 Ohm
Lead Channel Pacing Threshold Amplitude: 0.75 V
Lead Channel Pacing Threshold Amplitude: 0.75 V
Lead Channel Pacing Threshold Amplitude: 1 V
Lead Channel Pacing Threshold Amplitude: 1 V
Lead Channel Pacing Threshold Amplitude: 1.75 V
Lead Channel Pacing Threshold Amplitude: 1.75 V
Lead Channel Pacing Threshold Pulse Width: 0.5 ms
Lead Channel Pacing Threshold Pulse Width: 0.5 ms
Lead Channel Pacing Threshold Pulse Width: 0.5 ms
Lead Channel Pacing Threshold Pulse Width: 0.5 ms
Lead Channel Pacing Threshold Pulse Width: 0.7 ms
Lead Channel Pacing Threshold Pulse Width: 0.7 ms
Lead Channel Sensing Intrinsic Amplitude: 12 mV
Lead Channel Sensing Intrinsic Amplitude: 4.8 mV
Lead Channel Setting Pacing Amplitude: 1.75 V
Lead Channel Setting Pacing Amplitude: 2 V
Lead Channel Setting Pacing Amplitude: 2.5 V
Lead Channel Setting Pacing Pulse Width: 0.5 ms
Lead Channel Setting Pacing Pulse Width: 0.7 ms
Lead Channel Setting Sensing Sensitivity: 0.5 mV
Pulse Gen Serial Number: 111057141

## 2022-01-31 NOTE — Progress Notes (Signed)
Wound CRTD check appointment. Steri-strips removed. Wound without redness or edema. Incision edges approximated, wound well healed. Thresholds and sensing consistent with previous device measurements. Lead impedance trends stable over time. No mode switch episodes recorded. No ventricular arrhythmia episodes recorded. Patient bi-ventricularly pacing 99% of the time. Device programmed with appropriate safety margins. Heart failure diagnostics reviewed and trends are stable for patient. No changes made this session. Estimated longevity 7.5 years  Patient education completed including shock plan, wound care and arm mobility ROV with WC 04/25/2022

## 2022-01-31 NOTE — Patient Instructions (Signed)
° °  After Your ICD (Implantable Cardiac Defibrillator)    Monitor your defibrillator site for redness, swelling, and drainage. Call the device clinic at [phone] if you experience these symptoms or fever/chills.  Your incision was closed with Steri-strips or staples:  You may shower 7 days after your procedure and wash your incision with soap and water. Avoid lotions, ointments, or perfumes over your incision until it is well-healed.    Your ICD is designed to protect you from life threatening heart rhythms. Because of this, you may receive a shock.   1 shock with no symptoms:  Call the office during business hours. 1 shock with symptoms (chest pain, chest pressure, dizziness, lightheadedness, shortness of breath, overall feeling unwell):  Call 911. If you experience 2 or more shocks in 24 hours:  Call 911. If you receive a shock, you should not drive.  [place] DMV - no driving for 6 months if you receive appropriate therapy from your ICD.   ICD Alerts:  Some alerts are vibratory and others beep. These are NOT emergencies. Please call our office to let us know. If this occurs at night or on weekends, it can wait until the next business day. Send a remote transmission.  If your device is capable of reading fluid status (for heart failure), you will be offered monthly monitoring to review this with you.   Remote monitoring is used to monitor your ICD from home. This monitoring is scheduled every 91 days by our office. It allows us to keep an eye on the functioning of your device to ensure it is working properly. You will routinely see your Electrophysiologist annually (more often if necessary).  

## 2022-02-04 ENCOUNTER — Encounter: Payer: Self-pay | Admitting: Cardiology

## 2022-02-04 ENCOUNTER — Other Ambulatory Visit: Payer: Self-pay

## 2022-02-04 ENCOUNTER — Ambulatory Visit (INDEPENDENT_AMBULATORY_CARE_PROVIDER_SITE_OTHER): Payer: 59 | Admitting: Cardiology

## 2022-02-04 VITALS — BP 173/80 | HR 72 | Ht 64.0 in | Wt 247.2 lb

## 2022-02-04 DIAGNOSIS — I429 Cardiomyopathy, unspecified: Secondary | ICD-10-CM

## 2022-02-04 DIAGNOSIS — E7800 Pure hypercholesterolemia, unspecified: Secondary | ICD-10-CM | POA: Diagnosis not present

## 2022-02-04 DIAGNOSIS — I493 Ventricular premature depolarization: Secondary | ICD-10-CM | POA: Diagnosis not present

## 2022-02-04 DIAGNOSIS — I5022 Chronic systolic (congestive) heart failure: Secondary | ICD-10-CM

## 2022-02-04 DIAGNOSIS — I10 Essential (primary) hypertension: Secondary | ICD-10-CM

## 2022-02-04 MED ORDER — SACUBITRIL-VALSARTAN 24-26 MG PO TABS: 1.0000 | ORAL_TABLET | Freq: Two times a day (BID) | ORAL | 6 refills | Status: DC

## 2022-02-04 NOTE — Patient Instructions (Signed)
Medication Instructions:   STOP LOSARTAN   START ENTRESTO 24/26 MG ONE TABLET TWICE DAILY  *If you need a refill on your cardiac medications before your next appointment, please call your pharmacy*   Lab Work:  Your physician recommends that you HAVE LAB WORK TODAY  If you have labs (blood work) drawn today and your tests are completely normal, you will receive your results only by: MyChart Message (if you have MyChart) OR A paper copy in the mail If you have any lab test that is abnormal or we need to change your treatment, we will call you to review the results.   Follow-Up: At [facility], you and your health needs are our priority.  As part of our continuing mission to provide you with exceptional heart care, we have created designated Provider Care Teams.  These Care Teams include your primary Cardiologist (physician) and Advanced Practice Providers (APPs -  Physician Assistants and Nurse Practitioners) who all work together to provide you with the care you need, when you need it.  We recommend signing up for the patient portal called "MyChart".  Sign up information is provided on this After Visit Summary.  MyChart is used to connect with patients for Virtual Visits (Telemedicine).  Patients are able to view lab/test results, encounter notes, upcoming appointments, etc.  Non-urgent messages can be sent to your provider as well.   To learn more about what you can do with MyChart, go to [email or web address].    Your next appointment:   4 month(s)  The format for your next appointment:   In Person  Provider:   Olga Millers, MD

## 2022-02-05 ENCOUNTER — Telehealth: Payer: Self-pay | Admitting: Cardiology

## 2022-02-05 ENCOUNTER — Other Ambulatory Visit: Payer: Self-pay | Admitting: *Deleted

## 2022-02-05 DIAGNOSIS — E7800 Pure hypercholesterolemia, unspecified: Secondary | ICD-10-CM

## 2022-02-05 LAB — BASIC METABOLIC PANEL
BUN/Creatinine Ratio: 18 (calc) (ref 6–22)
BUN: 20 mg/dL (ref 7–25)
CO2: 35 mmol/L — ABNORMAL HIGH (ref 20–32)
Calcium: 10.9 mg/dL — ABNORMAL HIGH (ref 8.6–10.4)
Chloride: 99 mmol/L (ref 98–110)
Creat: 1.13 mg/dL — ABNORMAL HIGH (ref 0.50–1.05)
Glucose, Bld: 103 mg/dL — ABNORMAL HIGH (ref 65–99)
Potassium: 3.9 mmol/L (ref 3.5–5.3)
Sodium: 141 mmol/L (ref 135–146)

## 2022-02-05 LAB — LIPID PANEL
Cholesterol: 265 mg/dL — ABNORMAL HIGH (ref <200)
HDL: 53 mg/dL (ref >=50)
LDL Cholesterol (Calc): 176 mg/dL (calc) — ABNORMAL HIGH
Non-HDL Cholesterol (Calc): 212 mg/dL (calc) — ABNORMAL HIGH (ref <130)
Total CHOL/HDL Ratio: 5 (calc) — ABNORMAL HIGH (ref <5.0)
Triglycerides: 197 mg/dL — ABNORMAL HIGH (ref <150)

## 2022-02-05 NOTE — Telephone Encounter (Signed)
Spoke with pt, she is not able to get to the [place] office to meet with the pharmacist. She would like to do a video visit if that is available. She has not tolerated statins in the past and is very hesitant about any medications for cholesterol but is willing to discuss. Will forward to the pharmacist to schedule video appointment if able.

## 2022-02-05 NOTE — Telephone Encounter (Signed)
Pt would like to speak to Dr. Jens Som in regards to the pharmacist referral... please advise

## 2022-02-06 NOTE — Telephone Encounter (Signed)
We do not have video visit capability on any of our desktops. Appt will need to be in person or telephone.

## 2022-02-06 NOTE — Telephone Encounter (Signed)
Lmom pt to call back to schedule

## 2022-02-15 ENCOUNTER — Encounter: Payer: Self-pay | Admitting: Medical-Surgical

## 2022-02-19 ENCOUNTER — Ambulatory Visit (INDEPENDENT_AMBULATORY_CARE_PROVIDER_SITE_OTHER): Payer: 59 | Admitting: Medical-Surgical

## 2022-02-19 ENCOUNTER — Other Ambulatory Visit: Payer: Self-pay

## 2022-02-19 ENCOUNTER — Encounter: Payer: Self-pay | Admitting: Medical-Surgical

## 2022-02-19 VITALS — BP 166/76 | HR 62 | Resp 20 | Ht 64.0 in | Wt 249.4 lb

## 2022-02-19 DIAGNOSIS — M79672 Pain in left foot: Secondary | ICD-10-CM

## 2022-02-19 DIAGNOSIS — L299 Pruritus, unspecified: Secondary | ICD-10-CM

## 2022-02-19 DIAGNOSIS — I10 Essential (primary) hypertension: Secondary | ICD-10-CM

## 2022-02-19 DIAGNOSIS — L659 Nonscarring hair loss, unspecified: Secondary | ICD-10-CM

## 2022-02-19 MED ORDER — FLUOCINOLONE ACETONIDE SCALP 0.01 % EX OIL: 1.0000 "application " | TOPICAL_OIL | Freq: Every day | CUTANEOUS | 0 refills | Status: DC

## 2022-02-19 NOTE — Progress Notes (Signed)
HPI with pertinent ROS:   CC: Blood pressure, foot pain, scalp problem  HPI: Pleasant 66 year old female presenting today for the following:  Blood pressure-has been seeing cardiology who is managing her blood pressure however she was started on Entresto 24-26 mg twice daily about 3 weeks ago.  Notes that she has had significant side effects including decreased appetite, increased palpitations at night, burning in the epigastric region, dizziness, and generalized malaise.  She is monitoring her blood pressure at home and notes her lowest reading over the past couple of weeks has been [id number] systolically.  She has not reach back out to cardiology regarding her elevated blood pressure or her side effects.  Is interested in possibly taking apple cider vinegar Gummies or using homemade apple cider vinegar water to help with weight loss.  Has heard a lot about this on TV and wonders if it safe to take with her current medications.  Having foot pain along the ball of her left foot between the first and second toe.  Area is tender to palpation and it feels like she is stepping on a pebble.  Has had an ongoing scalp issue for the last 2 years.  She previously thought it was hard water in [place] but since she has relocated, the situation has returned.  She has tried multiple things over the years to take care of the problem.  Notes that her scalp itches around the hairline and she has had significant hair loss.  She has used [employer] recently but this made it worse.  She also has used head and shoulders dandruff shampoo and breeze (a blue solution in a bottle).  She has used Q-tips to dab rubbing alcohol on the areas involved and notes that it does dried up some.  She saw dermatology and [place] and notes they gave her a shampoo that was a red liquid and she thinks the name started with an H.  She is only washing her hair about once a week at this point.  I reviewed the past medical history, family  history, social history, surgical history, and allergies today and no changes were needed.  Please see the problem list section below in epic for further details.   Physical exam:   General: Well Developed, well nourished, and in no acute distress.  Neuro: Alert and oriented x3.  HEENT: Normocephalic, atraumatic.  Skin: Warm and dry.  Mildly erythematous, patchy, nonscaly areas noted to the hairline along the posterior ears.  Diffuse hair thinning most notable along the crown of the head. Cardiac: Regular rate and rhythm, no murmurs rubs or gallops, no lower extremity edema.  Respiratory: Clear to auscultation bilaterally. Not using accessory muscles, speaking in full sentences.  Impression and Recommendations:    1. Scalp itch 2. Hair loss Unclear etiology.  Presentation not consistent with seborrheic dermatitis or scalp psoriasis.  Sending in Derma-Smoothe oil for the scalp to see if this will help.  Referring to dermatology for further investigation. - Ambulatory referral to Dermatology  3. Left foot pain Presentation suspicious for Morton's neuroma although it is not at the most frequently seen location.  Recommend wearing supportive shoes and using conservative measures such as ice, massage, elevation, etc.  Referring to sports medicine to see Dr. Raeford Razor for possible orthotics/further evaluation - Ambulatory referral to Sports Medicine  4. HYPERTENSION, BENIGN SYSTEMIC She is already on Entresto, hydrochlorothiazide, flecainide, and furosemide.  She is followed by cardiology so feel that she would be best served by contacting  their office regarding her intolerance of the Entresto as well as her uncontrolled blood pressures.  Advised her to call their office at her earliest convenience to discuss her concerns and potential changes.  5.  Morbid obesity Discussed use of supplements such as apple cider vinegar and the importance of finding a reliable source to obtain the supplement from.   This is not FDA regulated so she will need to make sure that she does her research on what she buys.  Unlikely to interact or cause any issues with her current medications so she would like to proceed with this, that will be fine.  We did discuss the importance of diet and exercise for weight management and she has plans to get outside and become more active now that the weather is getting nicer.  Return if symptoms worsen or fail to improve. ___________________________________________ Clearnce Sorrel, DNP, APRN, FNP-BC Primary [facility] and [place]

## 2022-02-20 ENCOUNTER — Telehealth: Payer: Self-pay | Admitting: Gynecology

## 2022-02-20 DIAGNOSIS — I10 Essential (primary) hypertension: Secondary | ICD-10-CM

## 2022-02-20 MED ORDER — FUROSEMIDE 20 MG PO TABS: 20.0000 mg | ORAL_TABLET | Freq: Every day | ORAL | 3 refills | Status: DC

## 2022-02-20 MED ORDER — VALSARTAN 160 MG PO TABS: 160.0000 mg | ORAL_TABLET | Freq: Every day | ORAL | 3 refills | Status: DC

## 2022-02-20 MED ORDER — HYDROCHLOROTHIAZIDE 50 MG PO TABS: 50.0000 mg | ORAL_TABLET | Freq: Every day | ORAL | 0 refills | Status: DC

## 2022-02-20 NOTE — Telephone Encounter (Signed)
New Message: ? ? ? ? ? ?Patient said she saw her primary doctor  on yesterday(02-19-27)s. She told her to notify her Cardiologist that the blood pressure medicine is not working. ? ? ?Pt c/o medication issue: ? ?1. Name of Medication: Sherryll Burger ? ?2. How are you currently taking this medication (dosage and times per day)?  2 times a day ? ?3. Are you having a reaction (difficulty breathing--STAT)?  ? ?4. What is your medication issue? Patient says her primary doctor says it is not working- should she go ahead and take it this morning? ? ?

## 2022-02-20 NOTE — Telephone Encounter (Signed)
Spoke with pt, she refuses to take the entresto anymore as she feels she is having side effects from the medication. She reports dizziness when turning her head and also when sitting. She reports weight gain of 5 lb in 2 weeks and denies edema or SOB. Per dr Jens Som, the patient will stop entresto and restart valsartan 160 mg once daily. Aware the dose may need to be increased if blood pressure not controlled. ?

## 2022-02-20 NOTE — Telephone Encounter (Signed)
-  Pt called and reported despite changes in medication, her blood pressure continues to remain elevated ranging between 160-170's Today 152/97  ?-She also report since starting Entresto, she's felt dizzy and having more palpations.  ?-Pt state she was instructed by pcp to contact Dr. Jens Som for recommendations. ?-Will forward to MD ? ? ?

## 2022-03-04 ENCOUNTER — Other Ambulatory Visit: Payer: Self-pay | Admitting: Medical-Surgical

## 2022-03-04 NOTE — Progress Notes (Unsigned)
Patient ID: Alice Stewart                 DOB: [birth year]                    MRN: [id number]     HPI: Alice Stewart is a 66 y.o. female patient referred to lipid clinic by Dr Stanford Breed. 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  Phone visit We haven't prescribed any statins - get doses if possible, other side effects? Try crestor 10 then zetia, has only tried 2 lipophilic statins the worst tolerated. Has medicare and medicaid - can discuss pcsk9i if needed but only has mildly elevated CAC and we don't have her report  Current Medications: none  Intolerances: simvastatin - myalgias; amlodipine-atorvastatin - stomach burning, headache, salty taste in mouth Risk Factors: elevated calcium score LDL goal: 70mg /dL  Diet:   Exercise:   Family History: Mother with MS, father with HTN, sister with HTN and hypothyroidism.  Social History: Former tobacco use, quit in [id number]. 1 alcholic drink weekly, denies drug use.  Labs: 02/04/22: TC 265, TG 197, HDL 53, LDL 176, nonHDL 212 (no LLT)  Past Medical History:  Diagnosis Date   Anemia    Back pain    Benign positional vertigo    Cardiomyopathy ([place])    Carpal tunnel syndrome    Goiter    nontoxid mutinodular   Hypertension    Hyperthyroidism    Insulin resistance syndrome    Menopausal disorder    Menorrhagia    Migraine    Muscle spasm    trapezius   Neck pain, acute    Obesity    Seborrheic dermatitis    Thyroid disorder     Current Outpatient Medications on File Prior to Visit  Medication Sig Dispense Refill   Ascorbic Acid (VITAMIN C) 1000 MG tablet Take 1,000 mg by mouth daily.       aspirin 81 MG EC tablet Take 81 mg by mouth daily.     Bioflavonoid Products (BIOFLEX PO) Take 2 tablets by mouth daily.     Biotin 10000 MCG TABS Take 10,000 mcg by mouth daily.      Calcium Carb-Cholecalciferol 600-20 MG-MCG TABS Take 1 tablet by mouth daily.     COLLAGEN PO Take 3 capsules by mouth daily.     ferrous sulfate 325 (65 FE) MG tablet Take 325 mg by mouth daily with breakfast.     flecainide (TAMBOCOR) 100 MG tablet Take 100 mg by mouth 2 (two) times daily.     Fluocinolone Acetonide Scalp (DERMA-SMOOTHE/FS SCALP) 0.01 % OIL Apply 1 application topically daily. 118.28 mL 0   furosemide (LASIX) 20 MG tablet Take 1 tablet (20 mg total) by mouth daily. 90 tablet 3   hydrochlorothiazide (HYDRODIURIL) 50 MG tablet Take 1 tablet (50 mg total) by mouth daily. 90 tablet 0   Multiple Minerals-Vitamins (GNP CAL MAG ZINC +D3 PO) Take 3 tablets by mouth daily.     Multiple Vitamin (MULTIVITAMIN) tablet Take 1 tablet by mouth daily.       potassium chloride SA (KLOR-CON) 20 MEQ tablet Take 40 mEq by mouth daily. (Patient not taking: Reported on 02/04/2022)     Red Clover Leaf Extract 500 MG TABS Take 500 mg by mouth daily.  valsartan (DIOVAN) 160 MG tablet Take 1 tablet (160 mg total) by mouth daily. 90 tablet 3   vitamin B-12 (CYANOCOBALAMIN) 1000 MCG tablet Take 1,000 mcg by mouth daily.     vitamin E 180 MG (400 UNITS) capsule Take 400 Units by mouth daily.     No current facility-administered medications on file prior to visit.    Allergies  Allergen Reactions   Bystolic [Nebivolol Hcl] Other (See Comments)    Nausea.    Coreg [Carvedilol] Swelling   Estradiol Itching    Per patient, medication caused her to have a severe yeast reaction.    Amlodipine-Atorvastatin Other (See Comments)    salty taste in mouth, stomach burning, H/A   Chlorthalidone Nausea Only    Headache   Diltiazem Hcl Other (See Comments)    Dizzy   Lisinopril     backache   Metoprolol Tartrate Other (See Comments)    nausea,HA, dizziness   Simvastatin Other (See Comments)    Myalgia     Assessment/Plan:  1. Hyperlipidemia -

## 2022-03-05 ENCOUNTER — Ambulatory Visit (INDEPENDENT_AMBULATORY_CARE_PROVIDER_SITE_OTHER): Payer: 59 | Admitting: Pharmacist

## 2022-03-05 ENCOUNTER — Encounter: Payer: Self-pay | Admitting: Medical-Surgical

## 2022-03-05 ENCOUNTER — Other Ambulatory Visit: Payer: Self-pay

## 2022-03-05 ENCOUNTER — Telehealth: Payer: Self-pay | Admitting: Cardiology

## 2022-03-05 DIAGNOSIS — E7800 Pure hypercholesterolemia, unspecified: Secondary | ICD-10-CM

## 2022-03-05 MED ORDER — ROSUVASTATIN CALCIUM 5 MG PO TABS: 5.0000 mg | ORAL_TABLET | Freq: Every day | ORAL | 11 refills | Status: DC

## 2022-03-05 NOTE — Telephone Encounter (Signed)
Pt c/o medication issue: ? ?1. Name of Medication: Valsartan ? ?2. How are you currently taking this medication (dosage and times per day)? 1 time a day ? ?3. Are you having a reaction (difficulty breathing--STAT)? no ? ?4. What is your medication issue? Very dizzy, could not get out of bed yesterday, so dizzy- patient have not taken it today, afraid it will make her dizzy again ? ?

## 2022-03-05 NOTE — Telephone Encounter (Signed)
-  Pt called to report she was feeling very dizzy yesterday after taking her medication but did not take her blood pressure at that time. ?-She state she felt so dizzy she couldn't get out of bed ?-Later that night blood pressure was 171/102 ?-Pt state she has not taking her valsartan or HCTZ today because she's scared she will become dizzy.  ?-Current BP 168/97 HR 60 ? ? ?Will forward to MD for recommendations  ? ?

## 2022-03-06 MED ORDER — FLECAINIDE ACETATE 100 MG PO TABS
100.0000 mg | ORAL_TABLET | Freq: Two times a day (BID) | ORAL | 3 refills | Status: DC
Start: 2022-03-06 — End: 2022-10-22

## 2022-03-06 NOTE — Telephone Encounter (Signed)
Left message for pt to call.

## 2022-03-06 NOTE — Telephone Encounter (Signed)
Spoke with pt, Aware of dr Ludwig Clarks recommendations. Her blood pressure currently 162/98. She will continue to track her blood pressure 2 hours after taking medications and let us know how it is running. ?

## 2022-03-07 ENCOUNTER — Ambulatory Visit (INDEPENDENT_AMBULATORY_CARE_PROVIDER_SITE_OTHER): Payer: 59 | Admitting: Obstetrics and Gynecology

## 2022-03-07 ENCOUNTER — Other Ambulatory Visit (HOSPITAL_COMMUNITY)
Admission: RE | Admit: 2022-03-07 | Discharge: 2022-03-07 | Disposition: A | Discharge status: Home / Self Care | Payer: 59 | Source: Ambulatory Visit | Attending: Obstetrics and Gynecology | Admitting: Obstetrics and Gynecology

## 2022-03-07 ENCOUNTER — Other Ambulatory Visit: Payer: Self-pay

## 2022-03-07 ENCOUNTER — Encounter: Payer: Self-pay | Admitting: Obstetrics and Gynecology

## 2022-03-07 VITALS — BP 162/76 | HR 64 | Ht 64.0 in | Wt 253.0 lb

## 2022-03-07 DIAGNOSIS — N898 Other specified noninflammatory disorders of vagina: Secondary | ICD-10-CM | POA: Diagnosis present

## 2022-03-07 NOTE — Progress Notes (Signed)
66 yo P3 here for the evaluation of recurrent BV. Patient reports taking several antibiotics recently and was treated for a vaginal yeast infection. She reports some occasional vulva pruritis but overall feels improvement in her symptoms. She is returning for a vaginal swab to confirm resolution of a yeast infection and absence of BV ? ?Past Medical History:  ?Diagnosis Date  ? Anemia   ? Back pain   ? Benign positional vertigo   ? Cardiomyopathy ([place])   ? Carpal tunnel syndrome   ? Goiter   ? nontoxid mutinodular  ? Hypertension   ? Hyperthyroidism   ? Insulin resistance syndrome   ? Menopausal disorder   ? Menorrhagia   ? Migraine   ? Muscle spasm   ? trapezius  ? Neck pain, acute   ? Obesity   ? Seborrheic dermatitis   ? Thyroid disorder   ? ?Past Surgical History:  ?Procedure Laterality Date  ? DILATION AND CURETTAGE OF UTERUS  04/09  ? ENDOMETRIAL ABLATION  04/09  ? GREAT TOE ARTHRODESIS, METATARSALPHALANGEAL JOINT  6/07  ? Rt foot  ? HEEL SPUR SURGERY  11/87  ? Lt foot  ? ICD GENERATOR CHANGEOUT N/A 01/18/2022  ? Procedure: ICD GENERATOR CHANGEOUT;  Surgeon: Constance Haw, MD;  Location: [facility];  Service: Cardiovascular;  Laterality: N/A;  ? TUBAL LIGATION  11/90  ? ?Family History  ?Problem Relation Age of Onset  ? Other Mother   ?     MS  ? Hypertension Father   ? Hypothyroidism Sister   ? Hypertension Sister   ? Diabetes Other   ? ?Social History  ? ?Tobacco Use  ? Smoking status: Former  ?  Types: Cigarettes  ?  Quit date: 09/22/2004  ?  Years since quitting: 17.4  ? Smokeless tobacco: Never  ?Vaping Use  ? Vaping Use: Never used  ?Substance Use Topics  ? Alcohol use: Yes  ?  Alcohol/week: 1.0 standard drink  ?  Types: 1 Standard drinks or equivalent per week  ?  Comment: 1 weekly  ? Drug use: No  ? ?ROS ?See pertinent in HPI. All other systems reviewed and non contributory ?Blood pressure (!) 162/76, pulse 64, height 5\' 4"  (1.626 m), weight 253 lb (114.8 kg), last menstrual period  09/27/2011. ?GENERAL: Well-developed, well-nourished female in no acute distress.  ?ABDOMEN: Soft, nontender, nondistended. No organomegaly. ?PELVIC: Normal external female genitalia. Vagina is pink and rugated.  Normal discharge. Normal appearing cervix. Uterus is normal in size. No adnexal mass or tenderness. Chaperone present during the pelvic exam ?EXTREMITIES: No cyanosis, clubbing, or edema, 2+ distal pulses. ? ?A/P 66 yo with vaginitis ?- vaginal swab collected ?Patient will be contacted with results ? ?

## 2022-03-08 LAB — CERVICOVAGINAL ANCILLARY ONLY
Bacterial Vaginitis (gardnerella): NEGATIVE
Comment: NEGATIVE

## 2022-03-13 ENCOUNTER — Ambulatory Visit: Payer: 59

## 2022-03-18 ENCOUNTER — Other Ambulatory Visit: Payer: Self-pay | Admitting: Medical-Surgical

## 2022-04-02 ENCOUNTER — Telehealth: Payer: Self-pay | Admitting: Cardiology

## 2022-04-02 NOTE — Telephone Encounter (Signed)
Pt c/o medication issue: ? ?1. Name of Medication:  ?valsartan (DIOVAN) 160 MG tablet ? ?2. How are you currently taking this medication (dosage and times per day)?  ?Patient takes 1 160 MG tablet every morning ? ?3. Are you having a reaction (difficulty breathing--STAT)?  ? ?4. What is your medication issue?  ? ?Patient states for the past 2-3 weeks this medication has been causing extreme constipation, bloating, and cramping. She states she tried taking prunes to loosen her bowels, but it has not helped. She also states about 2 hours after taking the medication she becomes extremely tired. She states this has been a major inconvenience to her because she feels like she can not do anything productive or go anywhere without getting very tired.  ? ?

## 2022-04-02 NOTE — Telephone Encounter (Signed)
Patient reports that valsartan makes her sleepy 2 hours after taking it. She can be in her chair and fall asleep. She could not obtain a BP because she stated she was told not use her cuff because it reads higher than the clinic readings. "My ankles are like footballs." She weighs, but after eating. Patient teaching on when to weigh and to report weight gain of 2 pounds over night or 5 pounds in a week. She gets sob while taking out trash. She also reports constipation, bloating, nausea, stomach cramps. "I eat prunes like candy and am still constipated." Dr. Wyline Mood (DOD) recommended patient stay on valsartan and contact PCP for GI complaints. Patient informed to keep taking valsartan and to contact PCP for her GI concerns. Also informed patient that if SOB worsens, to go to ED. Patient voiced understanding of this conversation. ?

## 2022-04-08 ENCOUNTER — Ambulatory Visit (INDEPENDENT_AMBULATORY_CARE_PROVIDER_SITE_OTHER): Payer: 59 | Admitting: Family Medicine

## 2022-04-08 ENCOUNTER — Encounter: Payer: Self-pay | Admitting: Family Medicine

## 2022-04-08 ENCOUNTER — Ambulatory Visit: Payer: 59 | Admitting: Family Medicine

## 2022-04-08 VITALS — BP 182/80 | Ht 64.0 in | Wt 245.0 lb

## 2022-04-08 DIAGNOSIS — M7752 Other enthesopathy of left foot: Secondary | ICD-10-CM | POA: Insufficient documentation

## 2022-04-08 NOTE — Progress Notes (Signed)
?  Alice Stewart - 66 y.o. female MRN [id number]  Date of birth: May 28, 1956 ? ?SUBJECTIVE:  Including CC & ROS.  ?No chief complaint on file. ? ? ?Alice Stewart is a 66 y.o. female that is presenting with acute on chronic left toe pain.  She reports pain and altered sensation of the second digit.  The pain is worse with prolonged standing and walking. ? ? ? ?Review of Systems ?See HPI  ? ?HISTORY: Past Medical, Surgical, Social, and Family History Reviewed & Updated per EMR.   ?Pertinent Historical Findings include: ? ?Past Medical History:  ?Diagnosis Date  ? Anemia   ? Back pain   ? Benign positional vertigo   ? Cardiomyopathy ([place])   ? Carpal tunnel syndrome   ? Goiter   ? nontoxid mutinodular  ? Hypertension   ? Hyperthyroidism   ? Insulin resistance syndrome   ? Menopausal disorder   ? Menorrhagia   ? Migraine   ? Muscle spasm   ? trapezius  ? Neck pain, acute   ? Obesity   ? Seborrheic dermatitis   ? Thyroid disorder   ? ? ?Past Surgical History:  ?Procedure Laterality Date  ? DILATION AND CURETTAGE OF UTERUS  04/09  ? ENDOMETRIAL ABLATION  04/09  ? GREAT TOE ARTHRODESIS, METATARSALPHALANGEAL JOINT  6/07  ? Rt foot  ? HEEL SPUR SURGERY  11/87  ? Lt foot  ? ICD GENERATOR CHANGEOUT N/A 01/18/2022  ? Procedure: ICD GENERATOR CHANGEOUT;  Surgeon: Constance Haw, MD;  Location: [facility];  Service: Cardiovascular;  Laterality: N/A;  ? TUBAL LIGATION  11/90  ? ? ? ?PHYSICAL EXAM:  ?VS: BP (!) 182/80 (BP Location: Left Arm, Patient Position: Sitting)   Ht 5\' 4"  (1.626 m)   Wt 245 lb (111.1 kg)   LMP 09/27/2011   BMI 42.05 kg/m?  ?Physical Exam ?Gen: NAD, alert, cooperative with exam, well-appearing ?MSK:  ?Neurovascularly intact   ? ? ? ? ?ASSESSMENT & PLAN:  ? 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  Symptoms seem more consistent with MTP joint as opposed to a neuroma at this time. ?-Counseled on home exercise therapy and supportive care. ?-Green sport insoles with  Hapad ?-Could consider orthotics or injection. ? ? ? ? ?

## 2022-04-08 NOTE — Assessment & Plan Note (Signed)
Acutely occurring.  Symptoms seem more consistent with MTP joint as opposed to a neuroma at this time. ?-Counseled on home exercise therapy and supportive care. ?-Green sport insoles with Hapad ?-Could consider orthotics or injection. ?

## 2022-04-08 NOTE — Patient Instructions (Signed)
Nice to meet you ?Please try the insole  ?Please try to massage the toe   ?Please send me a message in MyChart with any questions or updates.  ?Please see me back in 3 weeks.  ? ?--Dr. Jordan Likes ? ?

## 2022-04-10 ENCOUNTER — Encounter: Payer: Self-pay | Admitting: Medical-Surgical

## 2022-04-10 NOTE — Progress Notes (Signed)
?  HPI with pertinent ROS:  ? ?CC: HTN follow up ? ?HPI: ?Pleasant 66 year old female presenting today for follow up on hypertension.  ? ?Clear watery vaginal discharge for the last couple of weeks. Feels that things in that area are wetter than usual and is worried about BV. Would like an exam and testing today. Has been sexually active with one female partner but would like STI testing included in her evaluation.  ? ?HTN- taking valsartan 160mg  daily, tolerating well. No significant side effects. Having headaches recently with dizziness. Takes 1000mg  Tylenol which is helpful. HAs started about 2 weeks ago. Low sodium diet. Walking for exercise. Not checking her BP at home due to a broken machine. Was seen by cardiology and her BP was controlled there.  ? ?HLD- historically elevated cholesterol but not on medication. Doesn't want to have to take medication but would like her lipids checked today.  ? ?Requesting to have her TSH checked. This was recommended by dermatology but never ordered to be completed by them. Feels that she has had multiple changes in her body lately and she wants her thyroid checked to be sure it is holding steady.  ? ?I reviewed the past medical history, family history, social history, surgical history, and allergies today and no changes were needed.  Please see the problem list section below in epic for further details. ? ? ?Physical exam:  ? ?General: Well Developed, well nourished, and in no acute distress.  ?Neuro: Alert and oriented x3.  ?HEENT: Normocephalic, atraumatic.  ?Skin: Warm and dry. ?Cardiac: Regular rate and rhythm, no murmurs rubs or gallops, no lower extremity edema.  ?Respiratory: Clear to auscultation bilaterally. Not using accessory muscles, speaking in full sentences. ?Pelvic exam: VULVA: normal appearing vulva with no masses, tenderness or lesions, VAGINA: normal appearing vagina with normal color and discharge, no lesions, vaginal discharge - clear and thin, CERVIX:  cervical discharge present - white and thick, PAP: Pap smear done today, DNA probe for chlamydia and GC obtained, HPV test, WET MOUNT done - results: pending lab report, exam chaperoned by , MA. ? ?Impression and Recommendations:   ? ?1. HYPERTENSION, BENIGN SYSTEMIC ?BP elevated significantly on arrival and worse on recheck. Increasing Valsartan to 320mg  daily. Continue low sodium diet. Recommend regular intentional exercise. Return in 2 weeks for [place] for BP check.  ? ?2. HYPERCHOLESTEROLEMIA ?Checking lipid panel.  ?- Lipid panel ? ?3. Hyperthyroidism ?Checking TSH.  ?- TSH ? ?4. Vaginal discharge ?Wet prep STAT.  ?- WET PREP FOR TRICH, YEAST, CLUE ? ?5. Cervical cancer screening ?Pap smear completed with STI and HPV cotesting.  ?- Cytology - PAP ? ?6. Routine screening for STI (sexually transmitted infection) ?STI testing per patient request.  ?- HIV Antibody (routine testing w rflx) ?- Hepatitis B surface antigen ?- Hepatitis C Antibody ?- RPR ? ?Return in about 2 weeks (around 04/25/2022) for nurse visit for BP check. ?___________________________________________ ?Isla Pence, DNP, APRN, FNP-BC ?Primary Care and Sports Medicine ?[facility] ?

## 2022-04-11 ENCOUNTER — Encounter: Payer: Self-pay | Admitting: Medical-Surgical

## 2022-04-11 ENCOUNTER — Ambulatory Visit (INDEPENDENT_AMBULATORY_CARE_PROVIDER_SITE_OTHER): Payer: 59 | Admitting: Medical-Surgical

## 2022-04-11 ENCOUNTER — Other Ambulatory Visit (HOSPITAL_COMMUNITY)
Admission: RE | Admit: 2022-04-11 | Discharge: 2022-04-11 | Disposition: A | Discharge status: Home / Self Care | Payer: 59 | Source: Ambulatory Visit | Attending: Medical-Surgical | Admitting: Medical-Surgical

## 2022-04-11 VITALS — BP 197/77 | HR 68 | Resp 20 | Ht 64.0 in | Wt 250.2 lb

## 2022-04-11 DIAGNOSIS — B379 Candidiasis, unspecified: Secondary | ICD-10-CM | POA: Insufficient documentation

## 2022-04-11 DIAGNOSIS — N898 Other specified noninflammatory disorders of vagina: Secondary | ICD-10-CM

## 2022-04-11 DIAGNOSIS — I10 Essential (primary) hypertension: Secondary | ICD-10-CM

## 2022-04-11 DIAGNOSIS — E0590 Thyrotoxicosis, unspecified without thyrotoxic crisis or storm: Secondary | ICD-10-CM | POA: Diagnosis not present

## 2022-04-11 DIAGNOSIS — E7800 Pure hypercholesterolemia, unspecified: Secondary | ICD-10-CM

## 2022-04-11 DIAGNOSIS — Z1151 Encounter for screening for human papillomavirus (HPV): Secondary | ICD-10-CM | POA: Insufficient documentation

## 2022-04-11 DIAGNOSIS — Z124 Encounter for screening for malignant neoplasm of cervix: Secondary | ICD-10-CM | POA: Insufficient documentation

## 2022-04-11 DIAGNOSIS — Z113 Encounter for screening for infections with a predominantly sexual mode of transmission: Secondary | ICD-10-CM | POA: Insufficient documentation

## 2022-04-11 DIAGNOSIS — Z01419 Encounter for gynecological examination (general) (routine) without abnormal findings: Secondary | ICD-10-CM | POA: Diagnosis present

## 2022-04-11 MED ORDER — FUROSEMIDE 20 MG PO TABS: 20.0000 mg | ORAL_TABLET | Freq: Every day | ORAL | 3 refills | Status: DC

## 2022-04-12 LAB — WET PREP FOR TRICH, YEAST, CLUE
MICRO NUMBER:: 13290248
Specimen Quality: ADEQUATE

## 2022-04-12 LAB — LIPID PANEL
Cholesterol: 225 mg/dL — ABNORMAL HIGH (ref <200)
HDL: 49 mg/dL — ABNORMAL LOW (ref >=50)
LDL Cholesterol (Calc): 145 mg/dL (calc) — ABNORMAL HIGH
Non-HDL Cholesterol (Calc): 176 mg/dL (calc) — ABNORMAL HIGH (ref <130)
Total CHOL/HDL Ratio: 4.6 (calc) (ref <5.0)
Triglycerides: 173 mg/dL — ABNORMAL HIGH (ref <150)

## 2022-04-12 LAB — RPR: RPR Ser Ql: NONREACTIVE

## 2022-04-12 LAB — HEPATITIS C ANTIBODY
Hepatitis C Ab: NONREACTIVE
SIGNAL TO CUT-OFF: 0.14 (ref <1.00)

## 2022-04-12 LAB — HIV ANTIBODY (ROUTINE TESTING W REFLEX): HIV 1&2 Ab, 4th Generation: NONREACTIVE

## 2022-04-12 LAB — TSH: TSH: 1.89 mIU/L (ref 0.40–4.50)

## 2022-04-12 LAB — HEPATITIS B SURFACE ANTIGEN: Hepatitis B Surface Ag: NONREACTIVE

## 2022-04-12 MED ORDER — METRONIDAZOLE 500 MG PO TABS: 500.0000 mg | ORAL_TABLET | Freq: Two times a day (BID) | ORAL | 0 refills | Status: AC

## 2022-04-12 NOTE — Addendum Note (Signed)
Addended bySamuel Bouche on: 04/12/2022 10:42 PM ? ? Modules accepted: Orders ? ?

## 2022-04-16 ENCOUNTER — Encounter: Payer: Self-pay | Admitting: Medical-Surgical

## 2022-04-16 LAB — CYTOLOGY - PAP
Adequacy: ABSENT
Chlamydia: NEGATIVE
Comment: NEGATIVE
Comment: NEGATIVE
Comment: NEGATIVE
Comment: NORMAL
Diagnosis: NEGATIVE
HSV1: NEGATIVE
HSV2: NEGATIVE
High risk HPV: NEGATIVE
Neisseria Gonorrhea: NEGATIVE

## 2022-04-19 ENCOUNTER — Ambulatory Visit (INDEPENDENT_AMBULATORY_CARE_PROVIDER_SITE_OTHER): Payer: 59

## 2022-04-19 DIAGNOSIS — I429 Cardiomyopathy, unspecified: Secondary | ICD-10-CM

## 2022-04-19 LAB — CUP PACEART REMOTE DEVICE CHECK
Battery Remaining Longevity: 90 mo
Battery Remaining Percentage: 95 %
Battery Voltage: 3.04 V
Brady Statistic AP VP Percent: 37 %
Brady Statistic AP VS Percent: 1 %
Brady Statistic AS VP Percent: 63 %
Brady Statistic AS VS Percent: 1 %
Brady Statistic RA Percent Paced: 36 %
Date Time Interrogation Session: 20230428020027
HighPow Impedance: 64 Ohm
Implantable Lead Implant Date: 20150303
Implantable Lead Implant Date: 20150303
Implantable Lead Implant Date: 20150303
Implantable Lead Location: 753858
Implantable Lead Location: 753859
Implantable Lead Location: 753860
Implantable Pulse Generator Implant Date: 20230127
Lead Channel Impedance Value: 430 Ohm
Lead Channel Impedance Value: 530 Ohm
Lead Channel Impedance Value: 650 Ohm
Lead Channel Pacing Threshold Amplitude: 0.5 V
Lead Channel Pacing Threshold Amplitude: 1 V
Lead Channel Pacing Threshold Amplitude: 1.625 V
Lead Channel Pacing Threshold Pulse Width: 0.5 ms
Lead Channel Pacing Threshold Pulse Width: 0.5 ms
Lead Channel Pacing Threshold Pulse Width: 0.7 ms
Lead Channel Sensing Intrinsic Amplitude: 12 mV
Lead Channel Sensing Intrinsic Amplitude: 4.5 mV
Lead Channel Setting Pacing Amplitude: 1.5 V
Lead Channel Setting Pacing Amplitude: 2 V
Lead Channel Setting Pacing Amplitude: 2.125
Lead Channel Setting Pacing Pulse Width: 0.5 ms
Lead Channel Setting Pacing Pulse Width: 0.7 ms
Lead Channel Setting Sensing Sensitivity: 0.5 mV
Pulse Gen Serial Number: 111057141

## 2022-04-25 ENCOUNTER — Other Ambulatory Visit: Payer: 59

## 2022-04-25 ENCOUNTER — Ambulatory Visit (INDEPENDENT_AMBULATORY_CARE_PROVIDER_SITE_OTHER): Payer: 59 | Admitting: Cardiology

## 2022-04-25 ENCOUNTER — Encounter: Payer: Self-pay | Admitting: Cardiology

## 2022-04-25 ENCOUNTER — Telehealth: Payer: Self-pay | Admitting: Pharmacist

## 2022-04-25 VITALS — BP 172/88 | HR 66 | Ht 64.0 in | Wt 250.0 lb

## 2022-04-25 DIAGNOSIS — I5022 Chronic systolic (congestive) heart failure: Secondary | ICD-10-CM

## 2022-04-25 DIAGNOSIS — I493 Ventricular premature depolarization: Secondary | ICD-10-CM

## 2022-04-25 LAB — CUP PACEART INCLINIC DEVICE CHECK
Battery Remaining Longevity: 90 mo
Brady Statistic RA Percent Paced: 35 %
Brady Statistic RV Percent Paced: 99.98 %
Date Time Interrogation Session: 20230504171613
HighPow Impedance: 65.25 Ohm
Implantable Lead Implant Date: 20150303
Implantable Lead Implant Date: 20150303
Implantable Lead Implant Date: 20150303
Implantable Lead Location: 753858
Implantable Lead Location: 753859
Implantable Lead Location: 753860
Implantable Pulse Generator Implant Date: 20230127
Lead Channel Impedance Value: 425 Ohm
Lead Channel Impedance Value: 550 Ohm
Lead Channel Impedance Value: 600 Ohm
Lead Channel Pacing Threshold Amplitude: 0.5 V
Lead Channel Pacing Threshold Amplitude: 0.5 V
Lead Channel Pacing Threshold Amplitude: 1 V
Lead Channel Pacing Threshold Amplitude: 1 V
Lead Channel Pacing Threshold Amplitude: 1.75 V
Lead Channel Pacing Threshold Amplitude: 1.75 V
Lead Channel Pacing Threshold Pulse Width: 0.5 ms
Lead Channel Pacing Threshold Pulse Width: 0.5 ms
Lead Channel Pacing Threshold Pulse Width: 0.5 ms
Lead Channel Pacing Threshold Pulse Width: 0.5 ms
Lead Channel Pacing Threshold Pulse Width: 0.7 ms
Lead Channel Pacing Threshold Pulse Width: 0.7 ms
Lead Channel Sensing Intrinsic Amplitude: 12 mV
Lead Channel Sensing Intrinsic Amplitude: 4.5 mV
Lead Channel Setting Pacing Amplitude: 1.875
Lead Channel Setting Pacing Amplitude: 1.875
Lead Channel Setting Pacing Amplitude: 2 V
Lead Channel Setting Pacing Pulse Width: 0.5 ms
Lead Channel Setting Pacing Pulse Width: 0.7 ms
Lead Channel Setting Sensing Sensitivity: 0.5 mV
Pulse Gen Serial Number: 111057141

## 2022-04-25 NOTE — Progress Notes (Signed)
? ?Electrophysiology Office Note ? ? ?Date:  04/25/2022  ? ?ID:  Alice Stewart, DOB [birth year], MRN [id number] ? ?PCP:  Samuel Bouche, NP  ?Cardiologist:  Stanford Breed ?Primary Electrophysiologist:  Gracyn Allor Meredith Leeds, MD   ? ?Chief Complaint: ICD ?  ?History of Present Illness: ?Alice Stewart is a 65 y.o. female who is being seen today for the evaluation of ICD at the request of Samuel Bouche, NP. Presenting today for electrophysiology evaluation. ? ?She has a history significant for systolic heart failure and left bundle branch block.  She has outflow tract PVCs.  She had an ablation attempt though this was unsuccessful.  She is status post [place] CRT-D implanted in 2015.  CTA July 2021 showed minimal nonobstructive coronary artery disease and a calcium score of 34.  Echo in 2021 showed an ejection fraction of 55 to 60%.  Her PVCs are currently being treated with flecainide.  She moved to the area from [place]. ? ?Today, denies symptoms of palpitations, chest pain, shortness of breath, orthopnea, PND, lower extremity edema, claudication, dizziness, presyncope, syncope, bleeding, or neurologic sequela. The patient is tolerating medications without difficulties.  She is feeling well.  She does have some fatigue and weakness.  This occurred when her primary physician increased her valsartan to 360 mg daily.  She states that she feels like she is going to fall asleep most of the time.  Otherwise she has no major complaints. ? ? ?Past Medical History:  ?Diagnosis Date  ? Anemia   ? Back pain   ? Benign positional vertigo   ? Cardiomyopathy ([place])   ? Carpal tunnel syndrome   ? Goiter   ? nontoxid mutinodular  ? Hypertension   ? Hyperthyroidism   ? Insulin resistance syndrome   ? Menopausal disorder   ? Menorrhagia   ? Migraine   ? Muscle spasm   ? trapezius  ? Neck pain, acute   ? Obesity   ? Seborrheic dermatitis   ? Thyroid disorder   ? ?Past Surgical History:  ?Procedure Laterality Date  ? DILATION AND  CURETTAGE OF UTERUS  04/09  ? ENDOMETRIAL ABLATION  04/09  ? GREAT TOE ARTHRODESIS, METATARSALPHALANGEAL JOINT  6/07  ? Rt foot  ? HEEL SPUR SURGERY  11/87  ? Lt foot  ? ICD GENERATOR CHANGEOUT N/A 01/18/2022  ? Procedure: ICD GENERATOR CHANGEOUT;  Surgeon: Constance Haw, MD;  Location: [facility];  Service: Cardiovascular;  Laterality: N/A;  ? TUBAL LIGATION  11/90  ? ? ? ?Current Outpatient Medications  ?Medication Sig Dispense Refill  ? Ascorbic Acid (VITAMIN C) 1000 MG tablet Take 1,000 mg by mouth daily.      ? aspirin 81 MG EC tablet Take 81 mg by mouth daily.    ? Bioflavonoid Products (BIOFLEX PO) Take 2 tablets by mouth daily.    ? Biotin 10000 MCG TABS Take 10,000 mcg by mouth daily.    ? Calcium Carb-Cholecalciferol 600-20 MG-MCG TABS Take 1 tablet by mouth daily.    ? COLLAGEN PO Take 3 capsules by mouth daily.    ? ferrous sulfate 325 (65 FE) MG tablet Take 325 mg by mouth every other day.    ? flecainide (TAMBOCOR) 100 MG tablet Take 1 tablet (100 mg total) by mouth 2 (two) times daily. 180 tablet 3  ? Fluocinolone Acetonide Scalp (DERMA-SMOOTHE/FS SCALP) 0.01 % OIL Apply 1 application topically daily. 118.28 mL 0  ? furosemide (LASIX) 20 MG tablet Take  1 tablet (20 mg total) by mouth daily. 90 tablet 3  ? Multiple Minerals-Vitamins (GNP CAL MAG ZINC +D3 PO) Take 3 tablets by mouth daily.    ? Multiple Vitamin (MULTIVITAMIN) tablet Take 1 tablet by mouth daily.      ? OVER THE COUNTER MEDICATION Take 1,000-2,000 mg by mouth daily. Dong quai    ? Red Clover Leaf Extract 500 MG TABS Take 500 mg by mouth daily.    ? valsartan (DIOVAN) 320 MG tablet Take 320 mg by mouth daily.    ? vitamin B-12 (CYANOCOBALAMIN) 1000 MCG tablet Take 1,000 mcg by mouth daily.    ? vitamin E 180 MG (400 UNITS) capsule Take 400 Units by mouth daily.    ? ?No current facility-administered medications for this visit.  ? ? ?Allergies:   Bystolic [nebivolol hcl], Coreg [carvedilol], Estradiol,  Amlodipine-atorvastatin, Chlorthalidone, Diltiazem hcl, Lisinopril, Metoprolol tartrate, and Simvastatin  ? ?Social History:  The patient  reports that she quit smoking about 17 years ago. Her smoking use included cigarettes. She has never used smokeless tobacco. She reports current alcohol use of about 1.0 standard drink per week. She reports that she does not use drugs.  ? ?Family History:  The patient's family history includes Diabetes in an other family member; Hypertension in her father and sister; Hypothyroidism in her sister; Other in her mother.  ? ?ROS:  Please see the history of present illness.   Otherwise, review of systems is positive for none.   All other systems are reviewed and negative.  ? ?PHYSICAL EXAM: ?VS:  BP (!) 172/88   Pulse 66   Ht 5\' 4"  (1.626 m)   Wt 250 lb (113.4 kg)   LMP 09/27/2011   SpO2 97%   BMI 42.91 kg/m?  , BMI Body mass index is 42.91 kg/m?. ?GEN: Well nourished, well developed, in no acute distress  ?HEENT: normal  ?Neck: no JVD, carotid bruits, or masses ?Cardiac: RRR; no murmurs, rubs, or gallops,no edema  ?Respiratory:  clear to auscultation bilaterally, normal work of breathing ?GI: soft, nontender, nondistended, + BS ?MS: no deformity or atrophy  ?Skin: warm and dry, device site well healed ?Neuro:  Strength and sensation are intact ?Psych: euthymic mood, full affect ? ?EKG:  EKG is ordered today. ?Personal review of the ekg ordered shows sinus rhythm, ventricular paced, rate 66 ? ?Personal review of the device interrogation today. Results in [place]  ? ?Recent Labs: ?01/08/2022: Hemoglobin 14.6; Platelets 333 ?02/04/2022: BUN 20; Creat 1.13; Potassium 3.9; Sodium 141 ?04/11/2022: TSH 1.89  ? ? ?Lipid Panel  ?   ?Component Value Date/Time  ? CHOL 225 (H) 04/11/2022 1601  ? TRIG 173 (H) 04/11/2022 1601  ? HDL 49 (L) 04/11/2022 1601  ? CHOLHDL 4.6 04/11/2022 1601  ? VLDL 22 03/11/2007 1943  ? LDLCALC 145 (H) 04/11/2022 1601  ? ? ? ?Wt Readings from Last 3 Encounters:   ?04/25/22 250 lb (113.4 kg)  ?04/11/22 250 lb 3.2 oz (113.5 kg)  ?04/08/22 245 lb (111.1 kg)  ?  ? ? ?Other studies Reviewed: ?Additional studies/ records that were reviewed today include: TTE 09/03/21  ?Review of the above records today demonstrates:  ? 1. Left ventricular ejection fraction, by estimation, is 45 to 50%. The  ?left ventricle has mildly decreased function. The left ventricle has no  ?regional wall motion abnormalities. There is moderate left ventricular  ?hypertrophy. Left ventricular  ?diastolic parameters are consistent with Grade I diastolic dysfunction  ?(impaired relaxation).  ?  2. Right ventricular systolic function is normal. The right ventricular  ?size is normal.  ? 3. The mitral valve is normal in structure. Mild mitral valve  ?regurgitation. No evidence of mitral stenosis.  ? 4. The aortic valve is normal in structure. Aortic valve regurgitation is  ?mild. No aortic stenosis is present.  ? 5. The inferior vena cava is normal in size with greater than 50%  ?respiratory variability, suggesting right atrial pressure of 3 mmHg. ? ? ?ASSESSMENT AND PLAN: ? ?1.  History of nonischemic cardiomyopathy: Fortunately ejection fraction is improved.  Currently on losartan 100 mg daily.  Status post [facility] CRT-D implanted in 2015 and Probation officer change 01/18/2022.  Device functioning appropriately.  No changes at this time. ? ?2.  PVCs: Currently on flecainide 100 mg twice daily.  High risk medication monitoring.  She is ventricular pacing 99% of the time and is having a low burden of PVCs.  We Fawnda Vitullo continue with current management. ? ?3.  Hypertension: Blood pressure significantly elevated today.  She is on valsartan 360 mg twice daily.  She is having fatigue on her current dosing.  She Anwar Crill cut the dose in half and take it twice a day. ? ?Current medicines are reviewed at length with the patient today.   ?The patient does not have concerns regarding her medicines.  The following changes were made today:   none ? ?Labs/ tests ordered today include:  ?Orders Placed This Encounter  ?Procedures  ? EKG 12-Lead  ? ? ? ? ?Disposition:   FU with Earlie Arciga 6 months ? ?Signed, ?Mattilyn Crites Meredith Leeds, MD  ?04/25/2022 4:32 PM    ? ?[facility] ?1

## 2022-04-25 NOTE — Telephone Encounter (Signed)
Pt was due to have repeat lipids checked today after starting rosuvastatin. She saw her PCP a few weeks ago who checked her cholesterol, LDL still elevated at 145. Labs for today have been canceled. She stopped her rosuvastatin, previously intolerant to 2 other statins and refused injectable therapy. Pt asked at her visit again today if she would like to discuss lipid lowering options for her cholesterol. Pt declined and does not wish to take any more medication. ?

## 2022-04-25 NOTE — Patient Instructions (Signed)
Medication Instructions:  ?Your physician recommends that you continue on your current medications as directed. Please refer to the Current Medication list given to you today. ? ?*If you need a refill on your cardiac medications before your next appointment, please call your pharmacy* ? ? ?Lab Work: ?None ordered ? ? ?Testing/Procedures: ?None ordered ? ? ?Follow-Up: ?At [facility], you and your health needs are our priority.  As part of our continuing mission to provide you with exceptional heart care, we have created designated Provider Care Teams.  These Care Teams include your primary Cardiologist (physician) and Advanced Practice Providers (APPs -  Physician Assistants and Nurse Practitioners) who all work together to provide you with the care you need, when you need it. ? ?Your next appointment:   ?6 month(s) ? ?The format for your next appointment:   ?In Person ? ?Provider:   ?You will see one of the following Advanced Practice Providers on your designated Care Team:   ?Francis Dowse, PA-C ?Casimiro Needle "Mardelle Matte" [place], PA-C ?  ? ? ?Thank you for choosing [facility]!! ? ? ?Dory Horn, RN ?([phone] ? ? ?Important Information About Sugar ? ? ? ? ? ? ? ? ?  ?

## 2022-04-26 ENCOUNTER — Ambulatory Visit (INDEPENDENT_AMBULATORY_CARE_PROVIDER_SITE_OTHER): Payer: Medicare Other | Admitting: Medical-Surgical

## 2022-04-26 VITALS — BP 168/66 | HR 64

## 2022-04-26 DIAGNOSIS — I10 Essential (primary) hypertension: Secondary | ICD-10-CM | POA: Diagnosis not present

## 2022-04-26 DIAGNOSIS — N898 Other specified noninflammatory disorders of vagina: Secondary | ICD-10-CM | POA: Diagnosis not present

## 2022-04-26 MED ORDER — VALSARTAN 160 MG PO TABS: 160.0000 mg | ORAL_TABLET | Freq: Two times a day (BID) | ORAL | 1 refills | Status: DC

## 2022-04-26 NOTE — Progress Notes (Signed)
? ?  Established Patient Office Visit ? ?Subjective   ?Patient ID: Alice Stewart, female    DOB: May 19, 1956  Age: 66 y.o. MRN: [id number] ? ?Chief Complaint  ?Patient presents with  ? Hypertension  ? ? ?HPI ? ?Alice Stewart is here for blood pressure check. Denies chest pain or shortness of breath. She did have dizziness. She was seen by her Cardiologist yesterday. He advised to take 160 mg of the Valsartan twice daily instead of 320 mg once daily. She reports this has helped a little with the dizziness.  ? ?She reports vaginal discharge and itching for 1 day. She has taken Monistat without improvement. Denies chills, fever or sweats. She states she usually takes diflucan for the yeast infection.  ? ?ROS ? ?  ?Objective:  ?  ? ?BP (!) 168/66   Pulse 64   LMP 09/27/2011   SpO2 99%  ? ? ?Physical Exam ? ? ?No results found for any visits on 04/26/22. ? ? ? ?The 10-year ASCVD risk score (Arnett DK, et al., 2019) is: 11.6% ? ?  ?Assessment & Plan:  ?Hypertension - Continue with current medications. Follow up with cardiology for hypertension management.  ? ?Vaginal itching - Wet prep sent stat. Will treat as needed.  ? ?Problem List Items Addressed This Visit   ? ? HYPERTENSION, BENIGN SYSTEMIC - Primary  ? Relevant Medications  ? valsartan (DIOVAN) 160 MG tablet  ? ?Other Visit Diagnoses   ? ? Itching in the vaginal area      ? Relevant Orders  ? WET PREP FOR [place], YEAST, CLUE  ? ?  ? ? ?Return if symptoms worsen or fail to improve.  ? ? ?Lavell Luster, [place] ? ?

## 2022-04-26 NOTE — Patient Instructions (Signed)
Continue with current medications. Follow up with cardiology for hypertension management.  ?

## 2022-04-26 NOTE — Progress Notes (Signed)
Agree with documentation as below.  ___________________________________________ Darolyn Double L. Billye Pickerel, DNP, APRN, FNP-BC Primary Care and Sports Medicine [facility]  

## 2022-04-27 LAB — WET PREP FOR TRICH, YEAST, CLUE
MICRO NUMBER:: 13357754
Specimen Quality: ADEQUATE

## 2022-04-28 MED ORDER — METRONIDAZOLE 500 MG PO TABS: 500.0000 mg | ORAL_TABLET | Freq: Two times a day (BID) | ORAL | 0 refills | Status: AC

## 2022-04-28 NOTE — Addendum Note (Signed)
Addended byChristen Butter on: 04/28/2022 06:42 PM ? ? Modules accepted: Orders ? ?

## 2022-05-01 ENCOUNTER — Ambulatory Visit (INDEPENDENT_AMBULATORY_CARE_PROVIDER_SITE_OTHER): Payer: Medicare Other | Admitting: Family Medicine

## 2022-05-01 ENCOUNTER — Ambulatory Visit (HOSPITAL_BASED_OUTPATIENT_CLINIC_OR_DEPARTMENT_OTHER)
Admission: RE | Admit: 2022-05-01 | Discharge: 2022-05-01 | Disposition: A | Discharge status: Home / Self Care | Payer: Medicare Other | Source: Ambulatory Visit | Attending: Family Medicine | Admitting: Family Medicine

## 2022-05-01 ENCOUNTER — Encounter: Payer: Self-pay | Admitting: Family Medicine

## 2022-05-01 VITALS — BP 170/90 | Ht 64.0 in | Wt 250.0 lb

## 2022-05-01 DIAGNOSIS — M7752 Other enthesopathy of left foot: Secondary | ICD-10-CM | POA: Diagnosis not present

## 2022-05-01 DIAGNOSIS — M222X2 Patellofemoral disorders, left knee: Secondary | ICD-10-CM | POA: Diagnosis not present

## 2022-05-01 DIAGNOSIS — M25561 Pain in right knee: Secondary | ICD-10-CM | POA: Diagnosis not present

## 2022-05-01 DIAGNOSIS — M222X1 Patellofemoral disorders, right knee: Secondary | ICD-10-CM

## 2022-05-01 DIAGNOSIS — M25562 Pain in left knee: Secondary | ICD-10-CM | POA: Diagnosis not present

## 2022-05-01 NOTE — Patient Instructions (Signed)
Good to see you ?Please use ice as needed  ?Please continue the insoles  ?I will call with the xray results.   ?Please send me a message in MyChart with any questions or updates.  ?Please see me back in 4 weeks.  ? ?--Dr. Jordan Likes ? ?

## 2022-05-01 NOTE — Assessment & Plan Note (Signed)
Has gotten improvement with the neuroma pad and green sport insoles.  Still having some stiffness at the second MCP joint. ?-Counseled on home exercise therapy and supportive care. ?-Switch from neuroma pad to metatarsal pad. ?-Provided green sport insoles. ?-Could consider injection. ?

## 2022-05-01 NOTE — Progress Notes (Signed)
?  Alice Stewart - 66 y.o. female MRN [id number]  Date of birth: 1956-02-21 ? ?SUBJECTIVE:  Including CC & ROS.  ?No chief complaint on file. ? ? ?Alice Stewart is a 66 y.o. female that is following up for her left toe pain.  She is experiencing tightness but not as severe.  Pain is intermittent in nature.  Still occurring at the second toe. ? ? ?Review of Systems ?See HPI  ? ?HISTORY: Past Medical, Surgical, Social, and Family History Reviewed & Updated per EMR.   ?Pertinent Historical Findings include: ? ?Past Medical History:  ?Diagnosis Date  ? Anemia   ? Back pain   ? Benign positional vertigo   ? Cardiomyopathy ([place])   ? Carpal tunnel syndrome   ? Goiter   ? nontoxid mutinodular  ? Hypertension   ? Hyperthyroidism   ? Insulin resistance syndrome   ? Menopausal disorder   ? Menorrhagia   ? Migraine   ? Muscle spasm   ? trapezius  ? Neck pain, acute   ? Obesity   ? Seborrheic dermatitis   ? Thyroid disorder   ? ? ?Past Surgical History:  ?Procedure Laterality Date  ? DILATION AND CURETTAGE OF UTERUS  04/09  ? ENDOMETRIAL ABLATION  04/09  ? GREAT TOE ARTHRODESIS, METATARSALPHALANGEAL JOINT  6/07  ? Rt foot  ? HEEL SPUR SURGERY  11/87  ? Lt foot  ? ICD GENERATOR CHANGEOUT N/A 01/18/2022  ? Procedure: ICD GENERATOR CHANGEOUT;  Surgeon: Constance Haw, MD;  Location: [place] [facility];  Service: Cardiovascular;  Laterality: N/A;  ? TUBAL LIGATION  11/90  ? ? ? ?PHYSICAL EXAM:  ?VS: BP (!) 170/90 (BP Location: Left Arm, Patient Position: Sitting)   Ht 5\' 4"  (1.626 m)   Wt 250 lb (113.4 kg)   LMP 09/27/2011   BMI 42.91 kg/m?  ?Physical Exam ?Gen: NAD, alert, cooperative with exam, well-appearing ?MSK: ?Neurovascularly intact   ? ? ? ? ?ASSESSMENT & PLAN:  ? ?Patellofemoral pain syndrome of both knees ?Acutely occurring.  She had a fall about 2 years ago still having pain with going up and down stairs. ?-Counseled on home exercise therapy and supportive care. ?-X-rays of the right and left knee. ?-Could  consider injection or physical therapy. ? 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  Still having some stiffness at the second MCP joint. ?-Counseled on home exercise therapy and supportive care. ?-Switch from neuroma pad to metatarsal pad. ?-Provided green sport insoles. ?-Could consider injection. ? ? ? ? ?

## 2022-05-01 NOTE — Assessment & Plan Note (Signed)
Acutely occurring.  She had a fall about 2 years ago still having pain with going up and down stairs. ?-Counseled on home exercise therapy and supportive care. ?-X-rays of the right and left knee. ?-Could consider injection or physical therapy. ?

## 2022-05-03 NOTE — Progress Notes (Signed)
Remote ICD transmission.   

## 2022-05-06 ENCOUNTER — Telehealth: Payer: Self-pay | Admitting: Family Medicine

## 2022-05-06 NOTE — Telephone Encounter (Signed)
Forwarding message to med asst to contact pt w/X-ray report results.. ? ?-glh ?

## 2022-05-06 NOTE — Telephone Encounter (Signed)
Left VM for patient. If she calls back please have her speak with a nurse/CMA and inform that her xrays showed no significant arthritis.  We can try an injection or physical therapy. ? ?If any questions then please take the best time and phone number to call and I will try to call her back.  ? ?Myra Rude, MD ?[facility] ?05/06/2022, 12:14 PM ? ? ?

## 2022-05-22 NOTE — Progress Notes (Signed)
HPI: FU CM.  Previously followed by Marshall Cork, MD in [place]. She has a history of echocardiogram in 2014 showed ejection fraction 25 to 30% with left bundle branch block.  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  She had CRT-D in 2015.  CTA in July 2021 showed minimal nonobstructive coronary disease with calcium score 34.  Echocardiogram repeated May 2021 showed ejection fraction 55 to 60%.  Her PVCs are now treated with flecainide. Patient previously moved back to this area from [place] state.  Follow-up echocardiogram here September 2022 showed ejection fraction 45 to 50%, moderate left ventricular hypertrophy, grade 1 diastolic dysfunction, mild mitral regurgitation. Patient had ICD generator change January 2023.  Note by report patient did not tolerate carvedilol, Toprol.  She also did not tolerate Entresto.  She is intolerant to statins.  Since last seen she has some dyspnea on exertion but no exertional chest pain.  She denies syncope.  She states the increased dose of valsartan has caused knee pain, fatigue and occasional dizziness.  Current Outpatient Medications  Medication Sig Dispense Refill   Ascorbic Acid (VITAMIN C) 1000 MG tablet Take 1,000 mg by mouth daily.       aspirin 81 MG EC tablet Take 81 mg by mouth daily.     Bioflavonoid Products (BIOFLEX PO) Take 2 tablets by mouth daily.     Biotin 10000 MCG TABS Take 10,000 mcg by mouth daily.     Calcium Carb-Cholecalciferol 600-20 MG-MCG TABS Take 1 tablet by mouth daily.     COLLAGEN PO Take 3 capsules by mouth daily.     ferrous sulfate 325 (65 FE) MG tablet Take 325 mg by mouth every other day.     flecainide (TAMBOCOR) 100 MG tablet Take 1 tablet (100 mg total) by mouth 2 (two) times daily. 180 tablet 3   Fluocinolone Acetonide Scalp (DERMA-SMOOTHE/FS SCALP) 0.01 % OIL Apply 1 application topically daily. 118.28 mL 0   furosemide (LASIX) 20 MG tablet Take 1 tablet (20 mg total)  by mouth daily. 90 tablet 3   Multiple Minerals-Vitamins (GNP CAL MAG ZINC +D3 PO) Take 3 tablets by mouth daily.     Multiple Vitamin (MULTIVITAMIN) tablet Take 1 tablet by mouth daily.       NON FORMULARY Take by mouth daily. 2 tablespoons olive oil     OVER THE COUNTER MEDICATION Take 1,000-2,000 mg by mouth daily. Dong quai     Red Clover Leaf Extract 500 MG TABS Take 500 mg by mouth daily.     valsartan (DIOVAN) 160 MG tablet Take 1 tablet (160 mg total) by mouth 2 (two) times daily. 60 tablet 1   vitamin B-12 (CYANOCOBALAMIN) 1000 MCG tablet Take 1,000 mcg by mouth daily.     vitamin E 180 MG (400 UNITS) capsule Take 400 Units by mouth daily.     No current facility-administered medications for this visit.     Past Medical History:  Diagnosis Date   Anemia    Back pain    Benign positional vertigo    Cardiomyopathy ([place])    Carpal tunnel syndrome    Goiter    nontoxid mutinodular   Hypertension    Hyperthyroidism    Insulin resistance syndrome    Menopausal disorder    Menorrhagia    Migraine    Muscle spasm    trapezius   Neck pain, acute    Obesity  Seborrheic dermatitis    Thyroid disorder     Past Surgical History:  Procedure Laterality Date   DILATION AND CURETTAGE OF UTERUS  04/09   ENDOMETRIAL ABLATION  04/09   GREAT TOE ARTHRODESIS, METATARSALPHALANGEAL JOINT  6/07   Rt foot   HEEL SPUR SURGERY  11/87   Lt foot   ICD GENERATOR CHANGEOUT N/A 01/18/2022   Procedure: ICD GENERATOR CHANGEOUT;  Surgeon: Constance Haw, MD;  Location: [place] [facility];  Service: Cardiovascular;  Laterality: N/A;   TUBAL LIGATION  11/90    Social History   Socioeconomic History   Marital status: Divorced    Spouse name: Not on file   Number of children: 3   Years of education: 14   Highest education level: Not on file  Occupational History   Occupation: unemployed    Employer: [id number]  Tobacco Use   Smoking status: Former    Types: Cigarettes    Quit  date: 09/22/2004    Years since quitting: 17.6   Smokeless tobacco: Never  Vaping Use   Vaping Use: Never used  Substance and Sexual Activity   Alcohol use: Yes    Alcohol/week: 1.0 standard drink    Types: 1 Standard drinks or equivalent per week    Comment: 1 weekly   Drug use: No   Sexual activity: Yes    Birth control/protection: Post-menopausal  Other Topics Concern   Not on file  Social History Narrative   3-4 caffeinated drinks per day   No regular exercise   Social Determinants of Health   Financial Resource Strain: Low Risk    Difficulty of Paying Living Expenses: Not hard at all  Food Insecurity: No Food Insecurity   Worried About Charity fundraiser in the Last Year: Never true   Ran Out of Food in the Last Year: Never true  Transportation Needs: No Transportation Needs   Lack of Transportation (Medical): No   Lack of Transportation (Non-Medical): No  Physical Activity: Sufficiently Active   Days of Exercise per Week: 5 days   Minutes of Exercise per Session: 30 min  Stress: No Stress Concern Present   Feeling of Stress : Not at all  Social Connections: Moderately Isolated   Frequency of Communication with Friends and Family: More than three times a week   Frequency of Social Gatherings with Friends and Family: More than three times a week   Attends Religious Services: 1 to 4 times per year   Active Member of [employer] or Organizations: No   Attends Music therapist: Never   Marital Status: Divorced  Human resources officer Violence: Not At Risk   Fear of Current or Ex-Partner: No   Emotionally Abused: No   Physically Abused: No   Sexually Abused: No    Family History  Problem Relation Age of Onset   Other Mother        MS   Hypertension Father    Hypothyroidism Sister    Hypertension Sister    Diabetes Other     ROS: Knee and back arthralgias but no fevers or chills, productive cough, hemoptysis, dysphasia, odynophagia, melena, hematochezia,  dysuria, hematuria, rash, seizure activity, orthopnea, PND, pedal edema, claudication. Remaining systems are negative.  Physical Exam: Well-developed well-nourished in no acute distress.  Skin is warm and dry.  HEENT is normal.  Neck is supple.  Chest is clear to auscultation with normal expansion.  Cardiovascular exam is regular rate and rhythm.  Abdominal exam nontender  or distended. No masses palpated. Extremities show no edema. neuro grossly intact    A/P  1 nonischemic cardiomyopathy-LV function mildly reduced on most recent echocardiogram.  We will continue ARB.  She has not tolerated beta-blockers in the past.  2 history of mildly elevated calcium score-patient is intolerant to statins.  Continue aspirin 81 mg daily.  3 hyperlipidemia-intolerant to statins.  We can consider addition of Zetia, Repatha or Praluent in the future if she is agreeable.  I am hesitant to add at this point as we are adjusting her blood pressure medications and she has had multiple medication intolerances in the past.  We will consider in the future.  4 hypertension-blood pressure is elevated.  She states it has been labile.  She will bring her blood pressure cuff and we will correlate with ours.  She will then track her blood pressure at home and we will add additional medications if she tolerates.  5 history of PVCs-we will continue flecainide.  6 CRT-D-followed by Dr. Curt Bears.  Kirk Ruths, MD

## 2022-05-29 ENCOUNTER — Ambulatory Visit (INDEPENDENT_AMBULATORY_CARE_PROVIDER_SITE_OTHER): Payer: Medicare Other | Admitting: Cardiology

## 2022-05-29 ENCOUNTER — Encounter: Payer: Self-pay | Admitting: Cardiology

## 2022-05-29 VITALS — BP 170/90 | HR 69 | Ht 64.0 in | Wt 251.1 lb

## 2022-05-29 DIAGNOSIS — I2510 Atherosclerotic heart disease of native coronary artery without angina pectoris: Secondary | ICD-10-CM

## 2022-05-29 DIAGNOSIS — I10 Essential (primary) hypertension: Secondary | ICD-10-CM | POA: Diagnosis not present

## 2022-05-29 DIAGNOSIS — I429 Cardiomyopathy, unspecified: Secondary | ICD-10-CM | POA: Diagnosis not present

## 2022-05-29 DIAGNOSIS — I493 Ventricular premature depolarization: Secondary | ICD-10-CM

## 2022-05-29 NOTE — Patient Instructions (Signed)

## 2022-05-30 DIAGNOSIS — L650 Telogen effluvium: Secondary | ICD-10-CM | POA: Diagnosis not present

## 2022-05-30 DIAGNOSIS — B351 Tinea unguium: Secondary | ICD-10-CM | POA: Diagnosis not present

## 2022-06-03 IMAGING — DX DG KNEE COMPLETE 4+V*R*
4 series · 4 of 4 positions shown · non-contrast
Comparison: None Available.

CLINICAL DATA: Bilateral patellofemoral pain syndrome

EXAM:
RIGHT KNEE - COMPLETE 4+ VIEW

[knee lat]
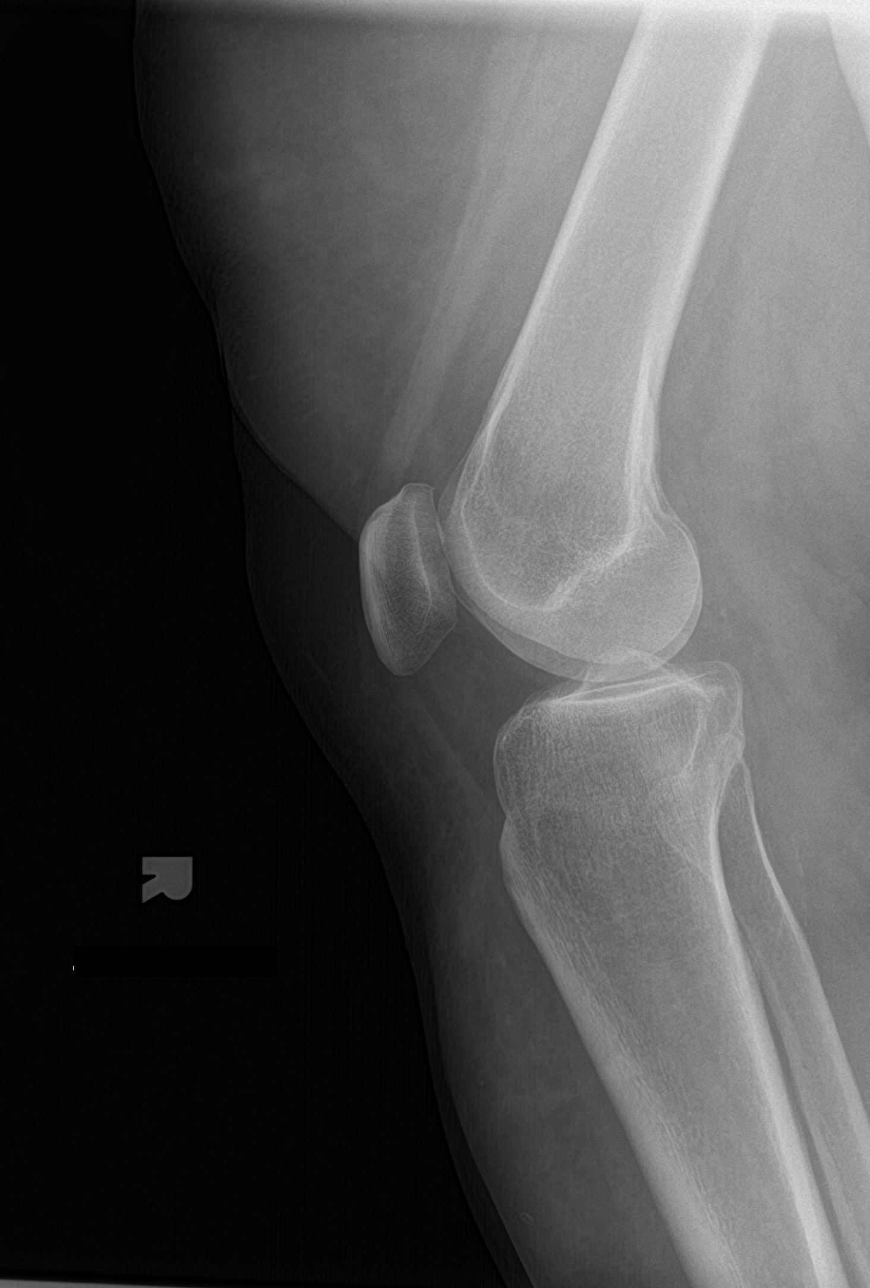

[knee ap standing]
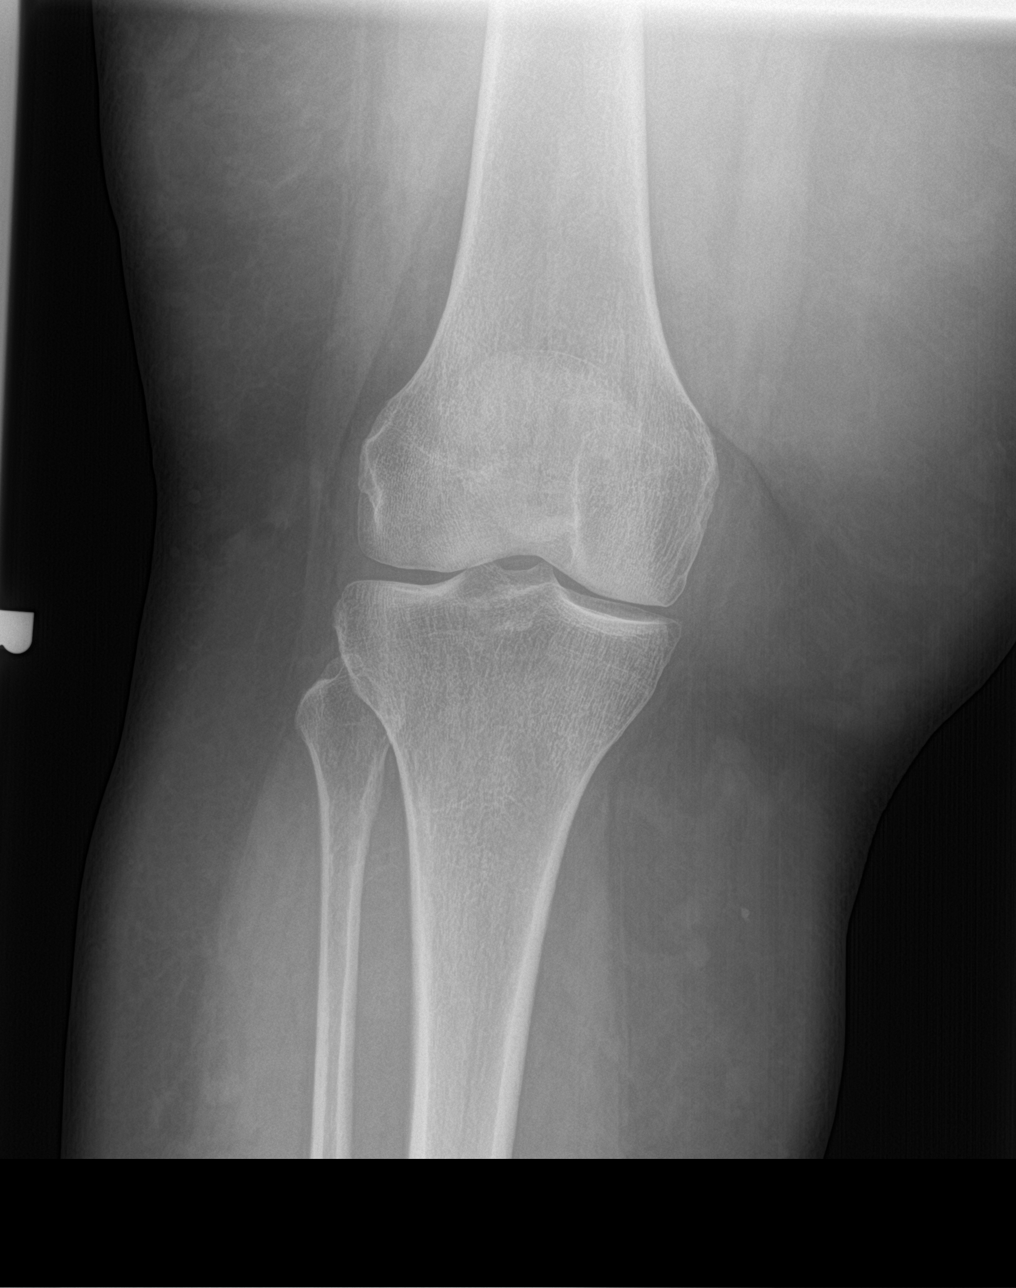

[tunnel]
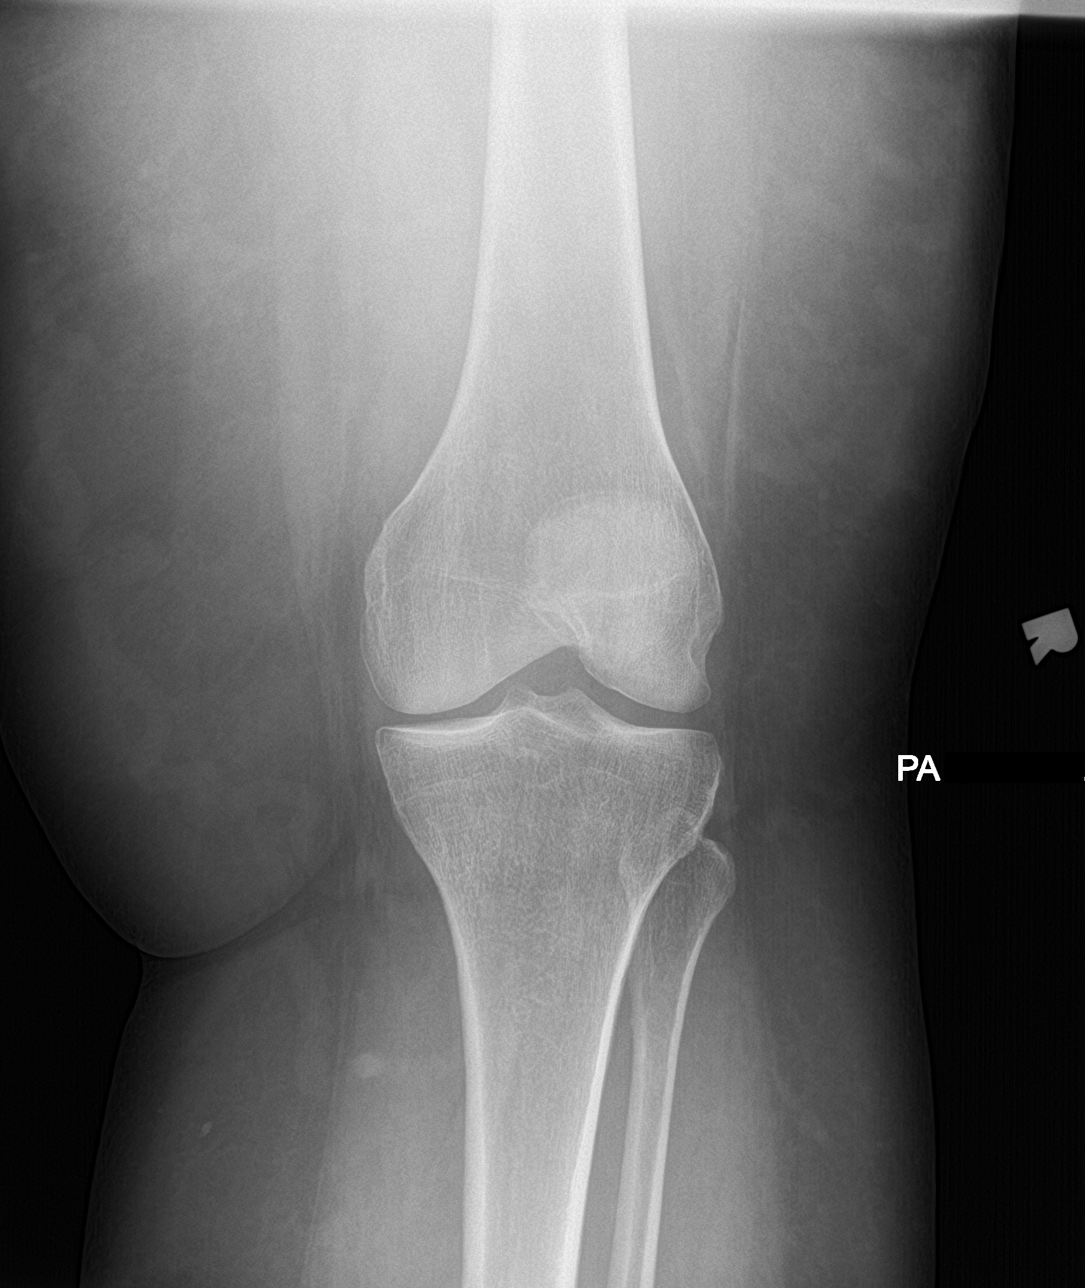

[knee sunrise]
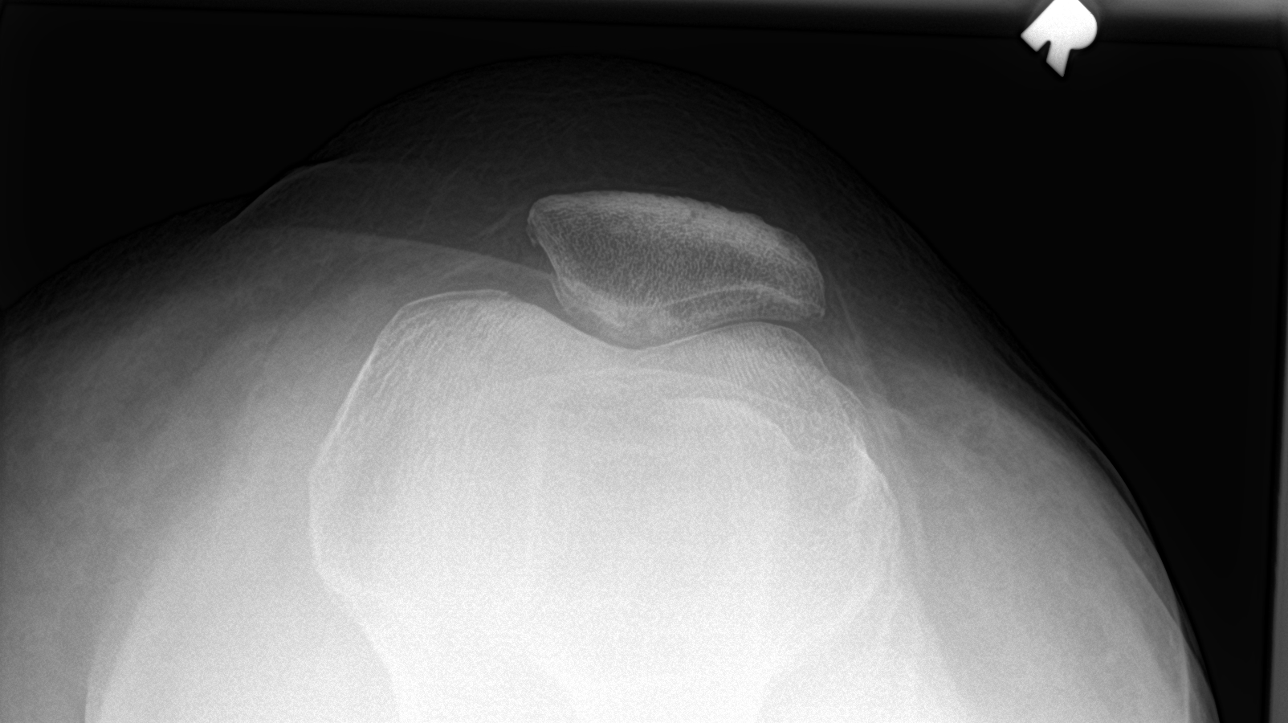

[4 of 4 positions shown; findings below may reference images not displayed]

FINDINGS: No evidence of fracture, dislocation, or joint effusion. No evidence
of arthropathy or other focal bone abnormality. Soft tissues are
unremarkable.
IMPRESSION: Negative.

## 2022-06-03 IMAGING — DX DG KNEE COMPLETE 4+V*L*
4 series · 4 of 4 positions shown · non-contrast
Comparison: None Available.

CLINICAL DATA: Left knee pain

EXAM:
LEFT KNEE - COMPLETE 4+ VIEW

[knee lat]
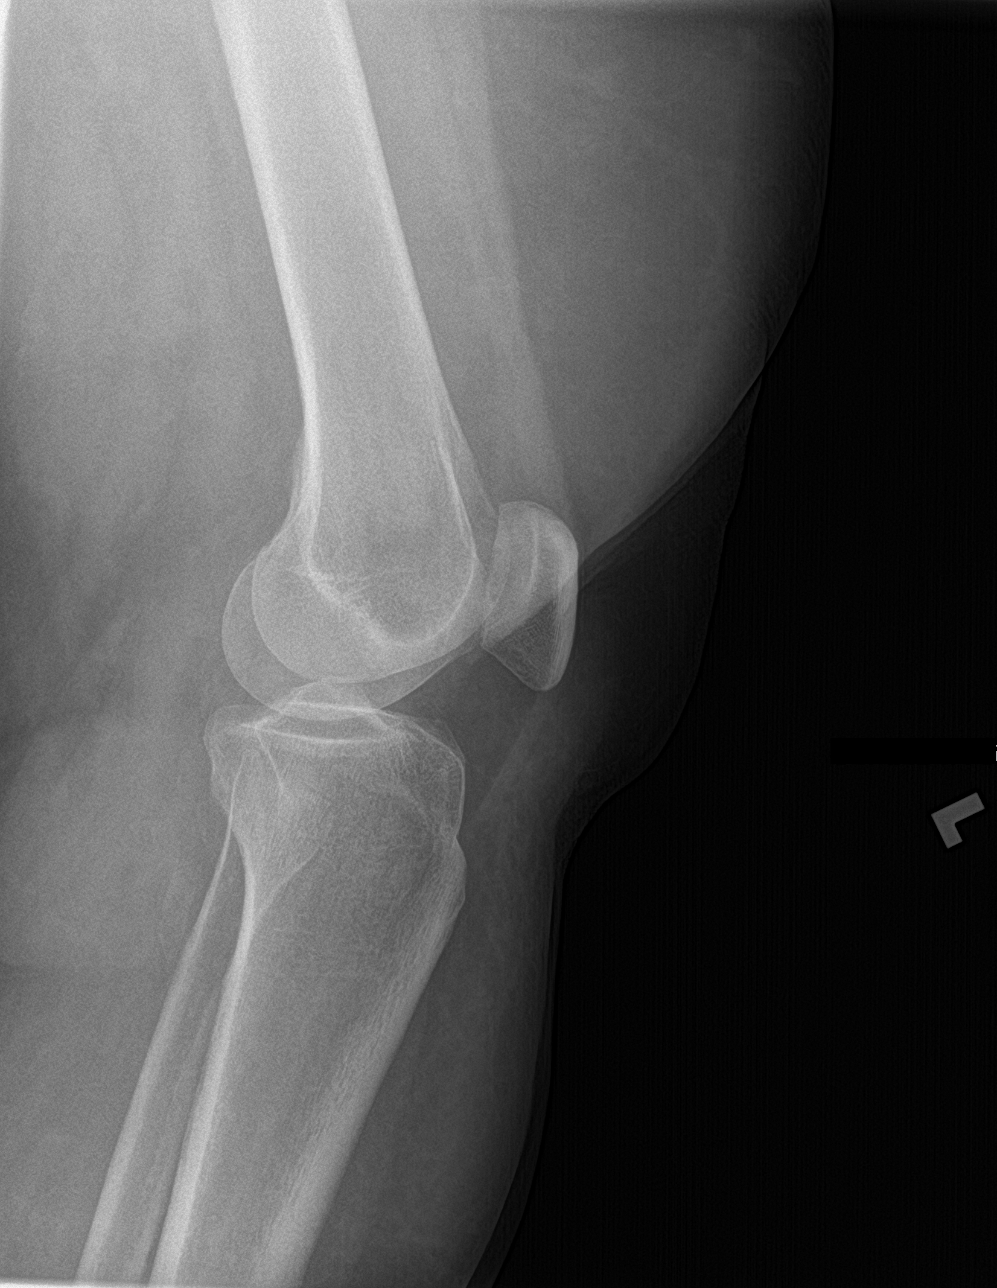

[knee sunrise]
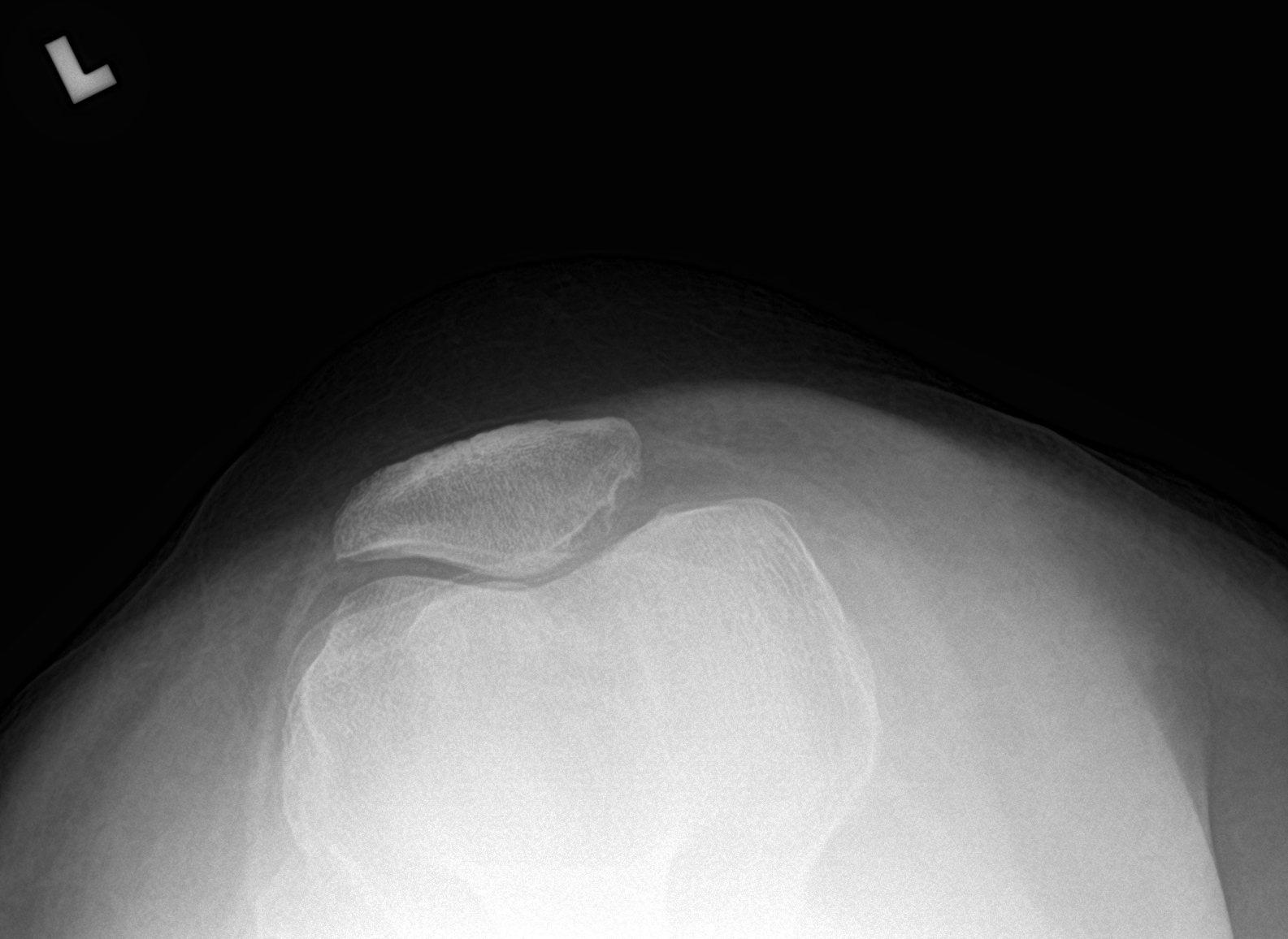

[knee ap standing]
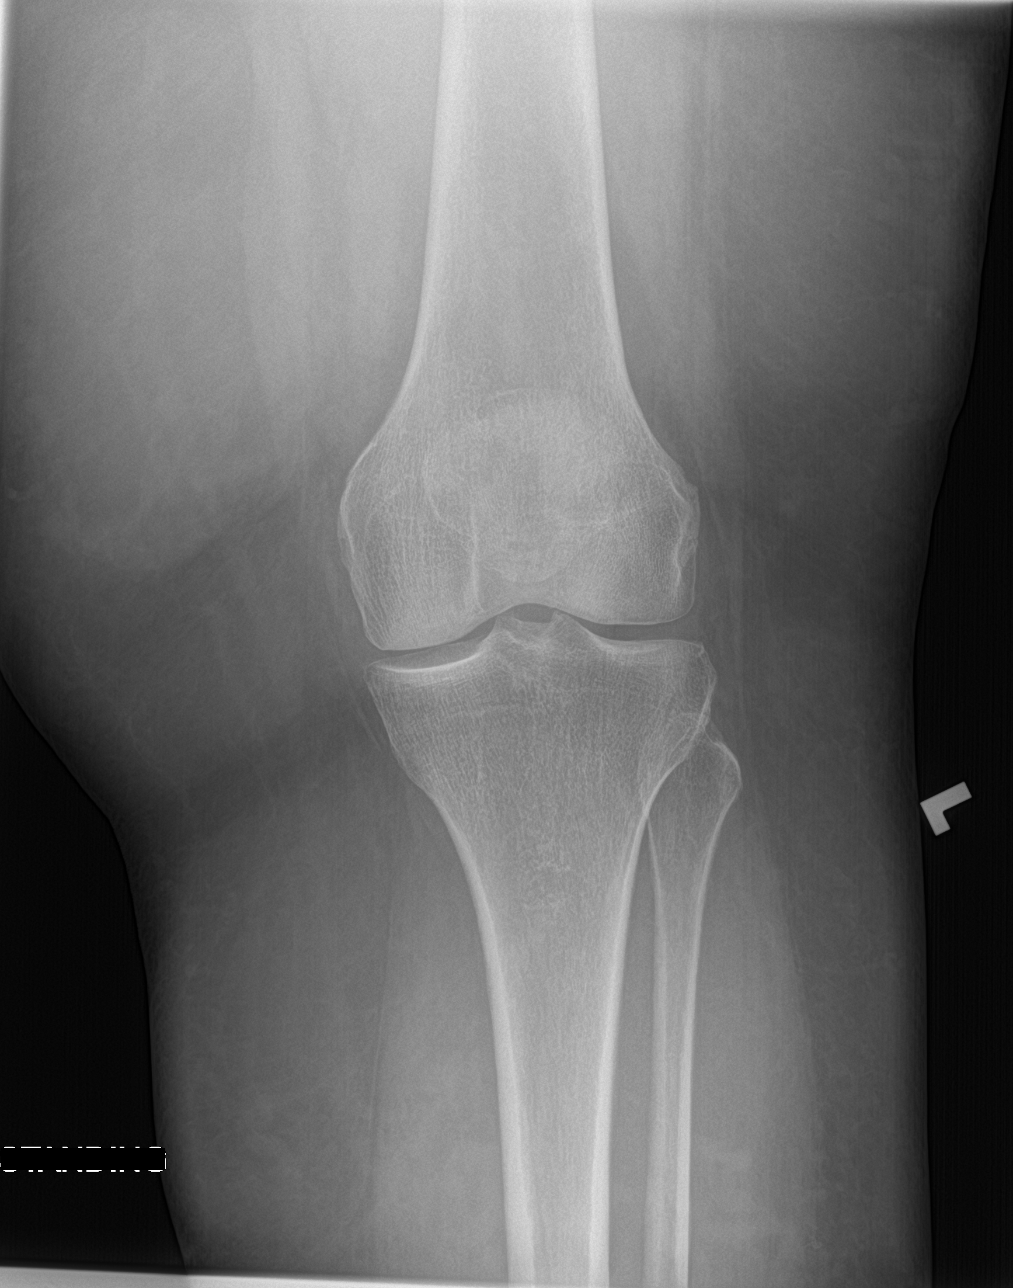

[tunnel]
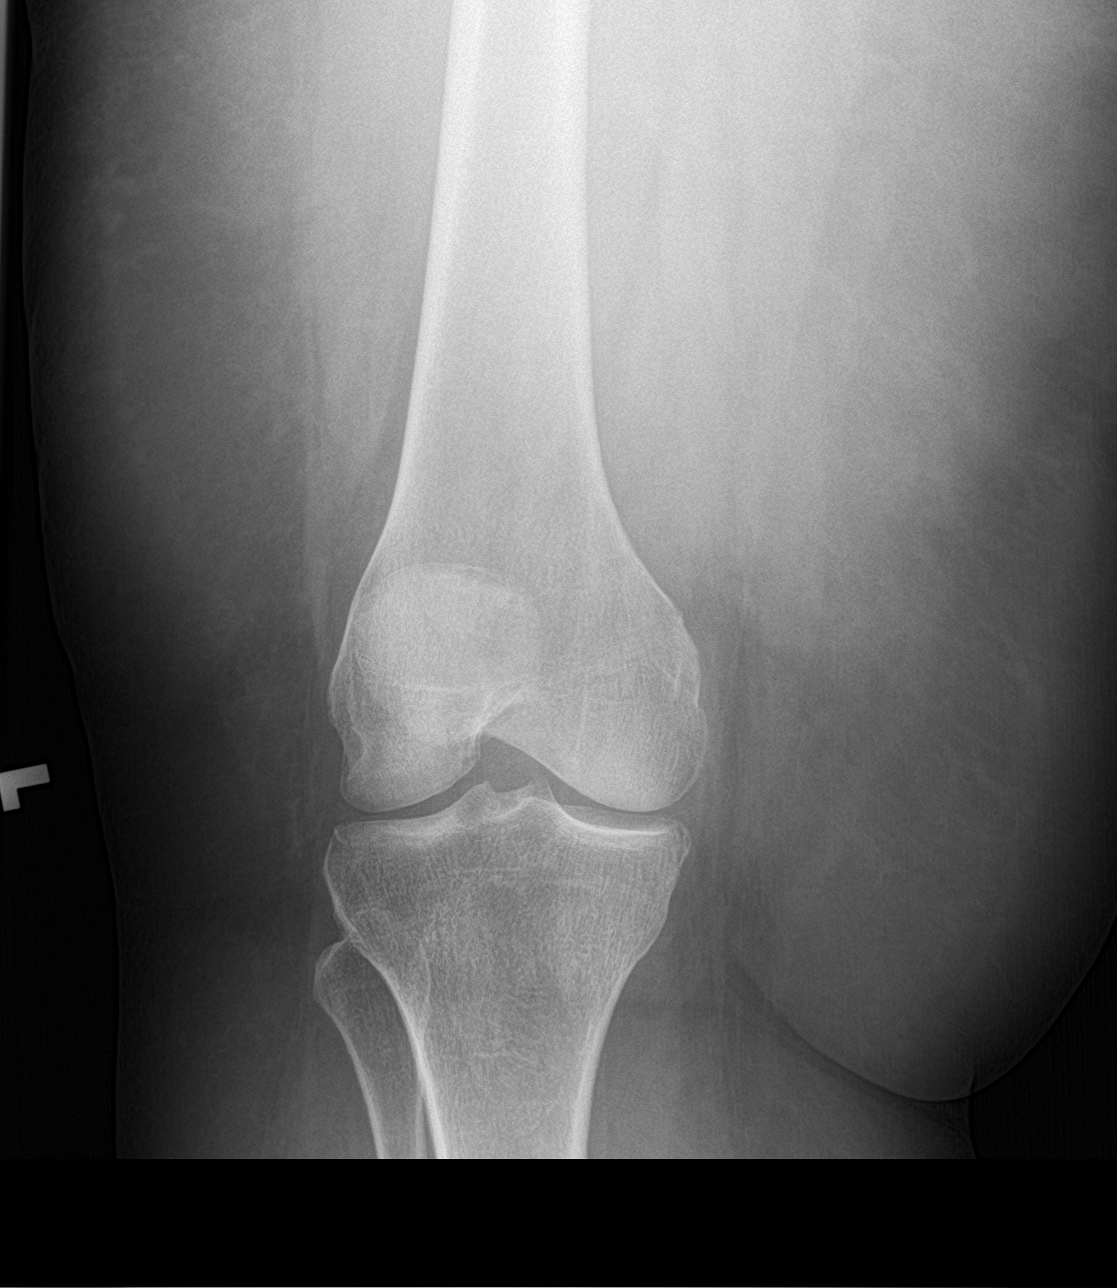

[4 of 4 positions shown; findings below may reference images not displayed]

FINDINGS: No fracture or malalignment. No sizable effusion. Mild surface
irregularity along the medial facet of patella possibly due to
subchondral cyst formation.
IMPRESSION: Mild patellofemoral degenerative change with possible subchondral
cystic change at the medial facet

## 2022-06-04 ENCOUNTER — Telehealth (INDEPENDENT_AMBULATORY_CARE_PROVIDER_SITE_OTHER): Payer: Medicare Other | Admitting: Family Medicine

## 2022-06-04 ENCOUNTER — Encounter: Payer: Self-pay | Admitting: Family Medicine

## 2022-06-04 VITALS — Ht 64.0 in | Wt 251.0 lb

## 2022-06-04 DIAGNOSIS — M222X2 Patellofemoral disorders, left knee: Secondary | ICD-10-CM

## 2022-06-04 DIAGNOSIS — M222X1 Patellofemoral disorders, right knee: Secondary | ICD-10-CM

## 2022-06-04 MED ORDER — NAPROXEN 500 MG PO TABS: 500.0000 mg | ORAL_TABLET | Freq: Two times a day (BID) | ORAL | 3 refills | Status: DC | PRN

## 2022-06-04 NOTE — Progress Notes (Signed)
Virtual Visit via Video Note  I connected with Alice Stewart on 06/04/22 at  3:10 PM EDT by a video enabled telemedicine application and verified that I am speaking with the correct person using two identifiers.  Location: Patient: home Provider: office   I discussed the limitations of evaluation and management by telemedicine and the availability of in person appointments. The patient expressed understanding and agreed to proceed.  History of Present Illness:  Alice Stewart is a 66 year old female who is following up for her bilateral knee pain.  The knee pain is still worse when she is going up and down stairs.  Her x-rays are reassuring.   Observations/Objective:   Assessment and Plan:  Patellofemoral syndrome of both knees: Still acutely occurring.  Pain still seems most consistent with patellofemoral syndrome as her x-rays are reassuring. -Counseled on home exercise therapy and supportive care. -Naproxen. -Could consider physical therapy, injection or further imaging.  Follow Up Instructions:    I discussed the assessment and treatment plan with the patient. The patient was provided an opportunity to ask questions and all were answered. The patient agreed with the plan and demonstrated an understanding of the instructions.   The patient was advised to call back or seek an in-person evaluation if the symptoms worsen or if the condition fails to improve as anticipated.   Clare Gandy, MD

## 2022-06-04 NOTE — Assessment & Plan Note (Signed)
Still acutely occurring.  Pain still seems most consistent with patellofemoral syndrome as her x-rays are reassuring. -Counseled on home exercise therapy and supportive care. -Naproxen. -Could consider physical therapy, injection or further imaging.

## 2022-06-27 ENCOUNTER — Telehealth: Payer: Self-pay | Admitting: Cardiology

## 2022-06-27 NOTE — Telephone Encounter (Signed)
   Pt c/o medication issue:  1. Name of Medication: valsartan (DIOVAN) 160 MG tablet  2. How are you currently taking this medication (dosage and times per day)? Take 1 tablet (160 mg total) by mouth 2 (two) times daily.  3. Are you having a reaction (difficulty breathing--STAT)? No   4. What is your medication issue? Pt said, she's been experiencing reaction when taking this medication. She said, its messing up her brain, she cant focus and a lot of memory lost and palpitations. She is not sure about her BP since her BP cuff is not working. She think the dosage is too much for her

## 2022-06-27 NOTE — Telephone Encounter (Signed)
Patient reports that since being on valsartan 160mg  twice daily, she has dizziness, memory issues, and palpitations. One month ago, her dermatologist started her on clobetasol propionate 0.05% topical for scalp issues. Her BP cuff is not working, so could not give reading. Appointment made for 7/11 with 9/11 to address BP issues.

## 2022-07-01 ENCOUNTER — Other Ambulatory Visit (HOSPITAL_COMMUNITY)
Admission: RE | Admit: 2022-07-01 | Discharge: 2022-07-01 | Disposition: A | Discharge status: Home / Self Care | Payer: Medicare Other | Source: Ambulatory Visit | Attending: Obstetrics & Gynecology | Admitting: Obstetrics & Gynecology

## 2022-07-01 ENCOUNTER — Ambulatory Visit (INDEPENDENT_AMBULATORY_CARE_PROVIDER_SITE_OTHER): Payer: Medicare Other | Admitting: Obstetrics & Gynecology

## 2022-07-01 ENCOUNTER — Encounter: Payer: Self-pay | Admitting: Obstetrics & Gynecology

## 2022-07-01 VITALS — BP 205/81 | HR 72 | Ht 64.0 in | Wt 249.0 lb

## 2022-07-01 DIAGNOSIS — N898 Other specified noninflammatory disorders of vagina: Secondary | ICD-10-CM | POA: Insufficient documentation

## 2022-07-01 DIAGNOSIS — N760 Acute vaginitis: Secondary | ICD-10-CM | POA: Insufficient documentation

## 2022-07-01 DIAGNOSIS — I10 Essential (primary) hypertension: Secondary | ICD-10-CM

## 2022-07-01 DIAGNOSIS — Z113 Encounter for screening for infections with a predominantly sexual mode of transmission: Secondary | ICD-10-CM | POA: Insufficient documentation

## 2022-07-01 DIAGNOSIS — B9689 Other specified bacterial agents as the cause of diseases classified elsewhere: Secondary | ICD-10-CM | POA: Diagnosis not present

## 2022-07-01 NOTE — Progress Notes (Signed)
Repeat BP 205/78

## 2022-07-01 NOTE — Progress Notes (Signed)
   Subjective:    Patient ID: Alice Stewart, female    DOB: June 28, 1956, 66 y.o.   MRN: [id number]  HPI Alice Stewart presents complaining of recurrent bacterial vaginosis.  I have outlined her most recent tests.  She has had at least 3 infections in the past 7 months.  She has not been sexually active recently.  She does wear a pad every day for urinary incontinence I think this is also causing some irritation of her labia majora.  Patient also wants to discuss becoming pregnant.  Her current partner is 30 years old and would like a son.  She requested to have her tubes reanastomosed to see if she could become pregnant on her own.  I advised her that given her postmenopausal status this would not be successful.  If she wanted to have a pregnancy she would need medical and cardiac clearance and see a reproductive endocrinologist.  He would most likely recommend donor egg IVF to achieve a pregnancy.  I advised her giving her uncontrolled hypertension and rhythm disturbances and the presence of a cardiomyopathy on her problem list, pregnancy could be medically contraindicated.  04/26/22 +Clue cells 04/11/22 +Clue cells 03/07/22  Neg BV 12/10/21  neg BV 11/01/21--+BV,+yeast  Review of Systems  Constitutional: Negative.   Respiratory: Negative.    Cardiovascular: Negative.   Gastrointestinal: Negative.   Genitourinary: Negative.        Objective:   Physical Exam Vitals reviewed.  Constitutional:      General: She is not in acute distress.    Appearance: She is well-developed.  HENT:     Head: Normocephalic and atraumatic.  Eyes:     Conjunctiva/sclera: Conjunctivae normal.  Cardiovascular:     Rate and Rhythm: Normal rate.  Pulmonary:     Effort: Pulmonary effort is normal.  Skin:    General: Skin is warm and dry.  Neurological:     Mental Status: She is alert and oriented to person, place, and time.  Psychiatric:        Mood and Affect: Mood normal.    Vitals:   07/01/22 1522  BP:  (!) 205/81  Pulse: 72  Weight: 249 lb (112.9 kg)  Height: 5\' 4"  (1.626 m)       Assessment & Plan:  66 year old female with recurrent bacterial vaginosis. Aptima today Patient requested that I send a note to her cardiologist to see if he would clear her for IVF pregnancy Uncontrolled hypertension with a diastolic of 205 today.  Patient states she is taking her medication and will see Dr. 64 tomorrow Patient can self schedule with Dr. Jens Som for consultation about donor egg IVF  35 minutes was spent with the patient, review of records, history and physical, counseling about recurrent bacterial vaginosis as well as donor egg IVF and medical challenges of pregnancy at age 66, coordination of care and documentation.

## 2022-07-02 ENCOUNTER — Encounter: Payer: Self-pay | Admitting: Physician Assistant

## 2022-07-02 ENCOUNTER — Other Ambulatory Visit: Payer: Self-pay | Admitting: Obstetrics & Gynecology

## 2022-07-02 ENCOUNTER — Ambulatory Visit (INDEPENDENT_AMBULATORY_CARE_PROVIDER_SITE_OTHER): Payer: Medicare Other | Admitting: Physician Assistant

## 2022-07-02 ENCOUNTER — Other Ambulatory Visit: Payer: Self-pay | Admitting: Physician Assistant

## 2022-07-02 ENCOUNTER — Encounter: Payer: Self-pay | Admitting: Obstetrics & Gynecology

## 2022-07-02 VITALS — BP 180/90 | HR 66 | Ht 64.0 in | Wt 247.0 lb

## 2022-07-02 DIAGNOSIS — I428 Other cardiomyopathies: Secondary | ICD-10-CM

## 2022-07-02 DIAGNOSIS — B9689 Other specified bacterial agents as the cause of diseases classified elsewhere: Secondary | ICD-10-CM | POA: Insufficient documentation

## 2022-07-02 DIAGNOSIS — E785 Hyperlipidemia, unspecified: Secondary | ICD-10-CM | POA: Diagnosis not present

## 2022-07-02 DIAGNOSIS — R0789 Other chest pain: Secondary | ICD-10-CM | POA: Diagnosis not present

## 2022-07-02 DIAGNOSIS — I10 Essential (primary) hypertension: Secondary | ICD-10-CM

## 2022-07-02 LAB — CERVICOVAGINAL ANCILLARY ONLY
Bacterial Vaginitis (gardnerella): POSITIVE — AB
Candida Glabrata: NEGATIVE
Candida Vaginitis: NEGATIVE
Chlamydia: NEGATIVE
Comment: NEGATIVE
Comment: NEGATIVE
Comment: NEGATIVE
Comment: NEGATIVE
Comment: NEGATIVE
Comment: NORMAL
Neisseria Gonorrhea: NEGATIVE
Trichomonas: NEGATIVE

## 2022-07-02 MED ORDER — FLUCONAZOLE 150 MG PO TABS: ORAL_TABLET | ORAL | 0 refills | Status: DC

## 2022-07-02 MED ORDER — METRONIDAZOLE 500 MG PO TABS: 500.0000 mg | ORAL_TABLET | Freq: Two times a day (BID) | ORAL | 0 refills | Status: DC

## 2022-07-02 MED ORDER — HYDRALAZINE HCL 25 MG PO TABS: 25.0000 mg | ORAL_TABLET | Freq: Three times a day (TID) | ORAL | 2 refills | Status: DC

## 2022-07-02 NOTE — Progress Notes (Signed)
Recurrent BV--Flagyl then TOC.  Once negative, will start metrogel twice a week for 4 months.

## 2022-07-02 NOTE — Progress Notes (Signed)
Cardiology Office Note:    Date:  07/04/2022   ID:  Alice Stewart, DOB November 05, 1956, MRN [id number]  PCP:  Christen Butter, NP   [facility] Providers Cardiologist:  Olga Millers, MD Electrophysiologist:  [name], MD     Referring MD: Christen Butter, NP   Chief Complaint  Patient presents with   Follow-up   Headache   Shortness of Breath   Edema    History of Present Illness:    Alice Stewart is a 66 y.o. female with a hx of left bundle branch block, NICM s/p St Jude CRT-D in 2015 with device change out Jan 2023, HTN, HLD and hypothyroidism.  Patient was previously followed by Dr. Mayra Reel in [place].  She has a history of EF as low as 25 to 30% with left bundle branch block in 2014.  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   CTA in July 2021 showed minimally nonobstructive CAD with calcium score 34.  Echocardiogram in May 2021 showed EF has improved to 55 to 60%.  Her PVCs are now treated with flecainide.  Follow-up echocardiogram in September 2022 showed EF 45 to 50%, moderate LVH, grade 1 DD, mild MR.  She had ICD generator change out in January 2023.  She did not tolerate carvedilol, Toprol and Entresto.  She is intolerant of statins as well.  Patient was last seen by Dr. Jens Som in June 2023 at which time she was doing well.  Patient presents today for evaluation of dizziness, headache, poor memory and occasional chest discomfort.  Previous coronary CT obtained on 06/29/2020 at outside facility showed calcium score of 31 which placed the patient 77th percentile for age and sex matched control, all of the calcium were present in the LAD.  Minimal luminal narrowing in the LAD, otherwise no significant plaque.  Her chest discomfort is somewhat atypical, given reassuring coronary CT before, will hold off on additional evaluation unless symptoms worsens.  Blood pressure is quite concerning.  She believe she is unable to  tolerate the 160 mg twice a day of valsartan.  Looking back, it appears she was previously on 360 mg daily, this was split in half by Dr. Elberta Fortis on 04/25/2022 with instruction to take half in the morning and half at night.  She says the valsartan is interfering with her memory.  Her blood pressure remained quite elevated in the office today.  Yesterday her blood pressure was over 200.  Unfortunately she has tried multiple medication in the past and was unable to tolerate them including carvedilol, Bystolic, chlorthalidone, amlodipine, lisinopril, metoprolol and by her report spironolactone as well.  She has not tried hydralazine, imdur or Cardura.  I decided to start her on hydralazine 25 mg 3 times a day.  If she is able to tolerate hydralazine, I may reduce the valsartan to 80 mg twice a day while increasing the hydralazine in the future.  Past Medical History:  Diagnosis Date   Anemia    Back pain    Benign positional vertigo    Cardiomyopathy (HCC)    Carpal tunnel syndrome    Goiter    nontoxid mutinodular   Hypertension    Hyperthyroidism    Insulin resistance syndrome    Menopausal disorder    Menorrhagia    Migraine    Muscle spasm    trapezius   Neck pain, acute    Obesity    Seborrheic dermatitis  Thyroid disorder     Past Surgical History:  Procedure Laterality Date   DILATION AND CURETTAGE OF UTERUS  04/09   ENDOMETRIAL ABLATION  04/09   GREAT TOE ARTHRODESIS, METATARSALPHALANGEAL JOINT  6/07   Rt foot   HEEL SPUR SURGERY  11/87   Lt foot   ICD GENERATOR CHANGEOUT N/A 01/18/2022   Procedure: ICD GENERATOR CHANGEOUT;  Surgeon: Regan Lemming, MD;  Location: [facility] INVASIVE CV LAB;  Service: Cardiovascular;  Laterality: N/A;   TUBAL LIGATION  11/90    Current Medications: Current Meds  Medication Sig   Ascorbic Acid (VITAMIN C) 1000 MG tablet Take 1,000 mg by mouth daily.     aspirin 81 MG EC tablet Take 81 mg by mouth daily.   Bioflavonoid Products (BIOFLEX PO)  Take 2 tablets by mouth daily.   Biotin 16109 MCG TABS Take 10,000 mcg by mouth daily.   Calcium Carb-Cholecalciferol 600-20 MG-MCG TABS Take 1 tablet by mouth daily.   clobetasol (TEMOVATE) 0.05 % external solution Apply topically 2 (two) times daily.   COLLAGEN PO Take 3 capsules by mouth daily.   ferrous sulfate 325 (65 FE) MG tablet Take 325 mg by mouth every other day.   flecainide (TAMBOCOR) 100 MG tablet Take 1 tablet (100 mg total) by mouth 2 (two) times daily.   fluconazole (DIFLUCAN) 150 MG tablet Take one tablet every 3 days until finished.   Fluocinolone Acetonide Scalp (DERMA-SMOOTHE/FS SCALP) 0.01 % OIL Apply 1 application topically daily.   furosemide (LASIX) 20 MG tablet Take 1 tablet (20 mg total) by mouth daily.   metroNIDAZOLE (FLAGYL) 500 MG tablet Take 1 tablet (500 mg total) by mouth 2 (two) times daily with a meal.   Multiple Minerals-Vitamins (GNP CAL MAG ZINC +D3 PO) Take 3 tablets by mouth daily.   Multiple Vitamin (MULTIVITAMIN) tablet Take 1 tablet by mouth daily.     naproxen (NAPROSYN) 500 MG tablet Take 1 tablet (500 mg total) by mouth 2 (two) times daily as needed.   NON FORMULARY Take by mouth daily. 2 tablespoons olive oil   OVER THE COUNTER MEDICATION Take 1,000-2,000 mg by mouth daily. [place] qu[place]   Red Clover Leaf Extract 500 MG TABS Take 500 mg by mouth daily.   valsartan (DIOVAN) 160 MG tablet Take 1 tablet (160 mg total) by mouth 2 (two) times daily.   vitamin B-12 (CYANOCOBALAMIN) 1000 MCG tablet Take 1,000 mcg by mouth daily.   vitamin E 180 MG (400 UNITS) capsule Take 400 Units by mouth daily.     Allergies:   Bystolic [nebivolol hcl], Coreg [carvedilol], Estradiol, Amlodipine-atorvastatin, Chlorthalidone, Diltiazem hcl, Lisinopril, Metoprolol tartrate, and Simvastatin   Social History   Socioeconomic History   Marital status: Divorced    Spouse name: Not on file   Number of children: 3   Years of education: 14   Highest education level: Not on  file  Occupational History   Occupation: unemployed    Employer: [id number]  Tobacco Use   Smoking status: Former    Types: Cigarettes    Quit date: 09/22/2004    Years since quitting: 17.7   Smokeless tobacco: Never  Vaping Use   Vaping Use: Never used  Substance and Sexual Activity   Alcohol use: Yes    Alcohol/week: 1.0 standard drink of alcohol    Types: 1 Standard drinks or equivalent per week    Comment: 1 weekly   Drug use: No   Sexual activity: Yes  Birth control/protection: Post-menopausal  Other Topics Concern   Not on file  Social History Narrative   3-4 caffeinated drinks per day   No regular exercise   Social Determinants of Health   Financial Resource Strain: Low Risk  (08/25/2021)   Overall Financial Resource Strain (CARDIA)    Difficulty of Paying Living Expenses: Not hard at all  Food Insecurity: No Food Insecurity (08/25/2021)   Hunger Vital Sign    Worried About Running Out of Food in the Last Year: Never true    Ran Out of Food in the Last Year: Never true  Transportation Needs: No Transportation Needs (08/25/2021)   PRAPARE - Administrator, Civil Service (Medical): No    Lack of Transportation (Non-Medical): No  Physical Activity: Sufficiently Active (08/25/2021)   Exercise Vital Sign    Days of Exercise per Week: 5 days    Minutes of Exercise per Session: 30 min  Stress: No Stress Concern Present (08/25/2021)   [employer] of Occupational Health - Occupational Stress Questionnaire    Feeling of Stress : Not at all  Social Connections: Moderately Isolated (08/25/2021)   Social Connection and Isolation Panel [NHANES]    Frequency of Communication with Friends and Family: More than three times a week    Frequency of Social Gatherings with Friends and Family: More than three times a week    Attends Religious Services: 1 to 4 times per year    Active Member of [employer] or Organizations: No    Attends Banker Meetings: Never     Marital Status: Divorced     Family History: The patient's family history includes Diabetes in an other family member; Hypertension in her father and sister; Hypothyroidism in her sister; Other in her mother.  ROS:   Please see the history of present illness.     All other systems reviewed and are negative.  EKGs/Labs/Other Studies Reviewed:    The following studies were reviewed today:  Echo 09/03/2021 1. Left ventricular ejection fraction, by estimation, is 45 to 50%. The  left ventricle has mildly decreased function. The left ventricle has no  regional wall motion abnormalities. There is moderate left ventricular  hypertrophy. Left ventricular  diastolic parameters are consistent with Grade I diastolic dysfunction  (impaired relaxation).   2. Right ventricular systolic function is normal. The right ventricular  size is normal.   3. The mitral valve is normal in structure. Mild mitral valve  regurgitation. No evidence of mitral stenosis.   4. The aortic valve is normal in structure. Aortic valve regurgitation is  mild. No aortic stenosis is present.   5. The inferior vena cava is normal in size with greater than 50%  respiratory variability, suggesting right atrial pressure of 3 mmHg.   Comparison(s): Echo from outside facility May 2021, LVEF 55-60%.  EKG:  EKG is not ordered today.    Recent Labs: 01/08/2022: Hemoglobin 14.6; Platelets 333 02/04/2022: BUN 20; Creat 1.13; Potassium 3.9; Sodium 141 04/11/2022: TSH 1.89  Recent Lipid Panel    Component Value Date/Time   CHOL 225 (H) 04/11/2022 1601   TRIG 173 (H) 04/11/2022 1601   HDL 49 (L) 04/11/2022 1601   CHOLHDL 4.6 04/11/2022 1601   VLDL 22 03/11/2007 1943   LDLCALC 145 (H) 04/11/2022 1601     Risk Assessment/Calculations:           Physical Exam:    VS:  BP (!) 180/90 (BP Location: Left Arm, Patient Position: Sitting,  Cuff Size: Large)   Pulse 66   Ht 5\' 4"  (1.626 m)   Wt 247 lb (112 kg)   LMP  09/27/2011   BMI 42.40 kg/m     Wt Readings from Last 3 Encounters:  07/02/22 247 lb (112 kg)  07/01/22 249 lb (112.9 kg)  06/04/22 251 lb (113.9 kg)     GEN:  Well nourished, well developed in no acute distress HEENT: Normal NECK: No JVD; No carotid bruits LYMPHATICS: No lymphadenopathy CARDIAC: RRR, no murmurs, rubs, gallops RESPIRATORY:  Clear to auscultation without rales, wheezing or rhonchi  ABDOMEN: Soft, non-tender, non-distended MUSCULOSKELETAL:  No edema; No deformity  SKIN: Warm and dry NEUROLOGIC:  Alert and oriented x 3 PSYCHIATRIC:  Normal affect   ASSESSMENT:    1. Uncontrolled hypertension   2. NICM (nonischemic cardiomyopathy) (HCC)   3. Hyperlipidemia LDL goal <100   4. Atypical chest pain    PLAN:    In order of problems listed above:  Uncontrolled hypertension: Patient's primary complaint is poor memory issue and intolerance of valsartan, looking back, she has multiple intolerance including carvedilol, amlodipine, chlorthalidone, diltiazem, lisinopril, and metoprolol.  By her own report, she has tried and was intolerant of spironolactone as well.  She wished to reduce the current valsartan dosage, I did not recommend this unless she can be started on another blood pressure medication.  She has not tried hydralazine, Imdur or Cardura.  I would not recommend clonidine in this patient.  We eventually decided to start on hydralazine 25 mg 3 times a day.  If she is able to tolerate the hydralazine, I may reduce the valsartan to 80 mg twice daily in the future while continue to uptitrate hydralazine  History of nonischemic cardiomyopathy: Echocardiogram obtained in September 2022 showed EF 45 to 50%  Hyperlipidemia: She is intolerant of simvastatin  Atypical chest pain: Symptom occurs at rest and does not occur with physical exertion.  Previous coronary CT obtained in 2021 showed minimal plaque, given the reassuring study, I did recommend continue observation.            Medication Adjustments/Labs and Tests Ordered: Current medicines are reviewed at length with the patient today.  Concerns regarding medicines are outlined above.  No orders of the defined types were placed in this encounter.  No orders of the defined types were placed in this encounter.   Patient Instructions  Medication Instructions:  START Hydralazine 25 mg 3 times a day   *If you need a refill on your cardiac medications before your next appointment, please call your pharmacy*  Lab Work: NONE ordered at this time of appointment   If you have labs (blood work) drawn today and your tests are completely normal, you will receive your results only by: MyChart Message (if you have MyChart) OR A paper copy in the mail If you have any lab test that is abnormal or we need to change your treatment, we will call you to review the results.  Testing/Procedures: NONE ordered at this time of appointment   Follow-Up: At [facility], you and your health needs are our priority.  As part of our continuing mission to provide you with exceptional heart care, we have created designated Provider Care Teams.  These Care Teams include your primary Cardiologist (physician) and Advanced Practice Providers (APPs -  Physician Assistants and Nurse Practitioners) who all work together to provide you with the care you need, when you need it.   Your next appointment:  3-4 week(s)  The format for your next appointment:   In Person  Provider:   Azalee Course, PA-C        Other Instructions   Important Information About Sugar         Ramond Dial, [place]  07/04/2022 11:52 PM     [facility]

## 2022-07-03 ENCOUNTER — Telehealth: Payer: Self-pay | Admitting: Physician Assistant

## 2022-07-03 NOTE — Telephone Encounter (Signed)
Spoke with patient - saw Polk PA yesterday  She is concerned about side effects of hydralazine She reports side effects on valsartan - dizziness, memory loss, has to write notes - concerned about how hydralazine will impact her  Will send message to pharmacy team

## 2022-07-03 NOTE — Patient Instructions (Signed)
Medication Instructions:  START Hydralazine 25 mg 3 times a day   *If you need a refill on your cardiac medications before your next appointment, please call your pharmacy*  Lab Work: NONE ordered at this time of appointment   If you have labs (blood work) drawn today and your tests are completely normal, you will receive your results only by: MyChart Message (if you have MyChart) OR A paper copy in the mail If you have any lab test that is abnormal or we need to change your treatment, we will call you to review the results.  Testing/Procedures: NONE ordered at this time of appointment   Follow-Up: At [facility], you and your health needs are our priority.  As part of our continuing mission to provide you with exceptional heart care, we have created designated Provider Care Teams.  These Care Teams include your primary Cardiologist (physician) and Advanced Practice Providers (APPs -  Physician Assistants and Nurse Practitioners) who all work together to provide you with the care you need, when you need it.   Your next appointment:   3-4 week(s)  The format for your next appointment:   In Person  Provider:   Azalee Course, PA-C        Other Instructions   Important Information About Sugar

## 2022-07-03 NOTE — Telephone Encounter (Signed)
Patient was seen yesterday, she wants to know if she is to take the new medication (doesn't remember the name starts with a B) along with her valasrtan, furosemide and flecanide.

## 2022-07-04 NOTE — Telephone Encounter (Signed)
Her blood pressure was very elevated at her visit at 180/90. BP medication options are limited by multitude of reported side effects on prior medications. She should start hydralazine and give it a chance, it's generally well tolerated. It should not cause memory loss at all. Occasionally can cause dizziness in the first few days as the body adjusts to the medication and BP improves.

## 2022-07-04 NOTE — Telephone Encounter (Signed)
Spoke to patient Alice Stewart's advice given.Stated she started taking Hydralazine this morning.Advised to monitor B/P daily and bring readings to appointment with Azalee Course PA 8/1 at 2:45 pm.

## 2022-07-05 ENCOUNTER — Other Ambulatory Visit: Payer: Self-pay | Admitting: Physician Assistant

## 2022-07-05 MED ORDER — DOXAZOSIN MESYLATE 2 MG PO TABS: 2.0000 mg | ORAL_TABLET | Freq: Every day | ORAL | 1 refills | Status: DC

## 2022-07-05 NOTE — Telephone Encounter (Signed)
Called pt she states I started taking this medication either Wednesday or Thursday (7/12-7/13). She states "I feel like I'm in between drunk and dizzy. I don't know if this medications is bad for me or what." Took second dose at 2:30 today. Pt took bp while on the phone:  223/89 HR 68. Pt reports dizziness now, "the room is spinning and I'm sitting still and the rapid heart beat." Pt retook her blood pressure 212/90 HR 67. Pt recommended to go to the Emergency Department to be evaluated. Spoke with Azalee Course. He requested phone call be transferred to him.

## 2022-07-05 NOTE — Telephone Encounter (Signed)
Pt c/o medication issue:  1. Name of Medication: hydrALAZINE (APRESOLINE) 25 MG tablet  2. How are you currently taking this medication (dosage and times per day)? As prescribed   3. Are you having a reaction (difficulty breathing--STAT)? Yes  4. What is your medication issue? Pt is taking this new med and is experiencing dizziness and nausea. Requesting call back. She is not sure she will continue to take it. She also states that her bp has not gone down. This morning it was 188/91.

## 2022-07-10 ENCOUNTER — Telehealth: Payer: Self-pay | Admitting: Physician Assistant

## 2022-07-10 DIAGNOSIS — I428 Other cardiomyopathies: Secondary | ICD-10-CM | POA: Diagnosis not present

## 2022-07-10 DIAGNOSIS — Z79899 Other long term (current) drug therapy: Secondary | ICD-10-CM | POA: Diagnosis not present

## 2022-07-10 DIAGNOSIS — R450 Nervousness: Secondary | ICD-10-CM | POA: Diagnosis not present

## 2022-07-10 DIAGNOSIS — R2689 Other abnormalities of gait and mobility: Secondary | ICD-10-CM | POA: Diagnosis not present

## 2022-07-10 DIAGNOSIS — G4489 Other headache syndrome: Secondary | ICD-10-CM | POA: Diagnosis not present

## 2022-07-10 DIAGNOSIS — I080 Rheumatic disorders of both mitral and aortic valves: Secondary | ICD-10-CM | POA: Diagnosis not present

## 2022-07-10 DIAGNOSIS — I110 Hypertensive heart disease with heart failure: Secondary | ICD-10-CM | POA: Diagnosis not present

## 2022-07-10 DIAGNOSIS — R197 Diarrhea, unspecified: Secondary | ICD-10-CM | POA: Diagnosis not present

## 2022-07-10 DIAGNOSIS — Z95810 Presence of automatic (implantable) cardiac defibrillator: Secondary | ICD-10-CM | POA: Diagnosis not present

## 2022-07-10 DIAGNOSIS — Z20822 Contact with and (suspected) exposure to covid-19: Secondary | ICD-10-CM | POA: Diagnosis not present

## 2022-07-10 DIAGNOSIS — R42 Dizziness and giddiness: Secondary | ICD-10-CM | POA: Diagnosis not present

## 2022-07-10 DIAGNOSIS — Z950 Presence of cardiac pacemaker: Secondary | ICD-10-CM | POA: Diagnosis not present

## 2022-07-10 DIAGNOSIS — I5022 Chronic systolic (congestive) heart failure: Secondary | ICD-10-CM | POA: Diagnosis not present

## 2022-07-10 DIAGNOSIS — Z87891 Personal history of nicotine dependence: Secondary | ICD-10-CM | POA: Diagnosis not present

## 2022-07-10 DIAGNOSIS — Z7982 Long term (current) use of aspirin: Secondary | ICD-10-CM | POA: Diagnosis not present

## 2022-07-10 NOTE — Telephone Encounter (Signed)
Pt c/o medication issue:  1. Name of Medication:  Doxazosin 2. How are you currently taking this medication (dosage and times per day)? 1 daily  3. Are you having a reaction (difficulty breathing--STAT)?   4. What is your medication issue? Nausea, dizziness ,body aches, twitch and diarrhea- please call asap - she said she can not handle all of this

## 2022-07-10 NOTE — Telephone Encounter (Signed)
Patient reports dizziness, nausea, and back pain (she lays on a heating pad for relief). She relates dizziness to doxazosin that she started taking this pat Monday. Had her check BP 222/112, P 74. Recommended that she call 911 to take her to ED. She wants Dr. Jens Som and Harrell Lark, PA-C to know she will go to [facility] ED.

## 2022-07-10 NOTE — Telephone Encounter (Signed)
Follow Up:   Patient called and says she is in [facility] ER. She said EMS would not take her to [facility]. She said she is shivering,  and still dizzy. She also have red spots on her body. She said she was just tying to keep you updated about what is going on.

## 2022-07-11 ENCOUNTER — Ambulatory Visit: Payer: Medicare Other | Admitting: Obstetrics and Gynecology

## 2022-07-11 DIAGNOSIS — Z95810 Presence of automatic (implantable) cardiac defibrillator: Secondary | ICD-10-CM | POA: Diagnosis not present

## 2022-07-11 DIAGNOSIS — R42 Dizziness and giddiness: Secondary | ICD-10-CM | POA: Diagnosis not present

## 2022-07-11 DIAGNOSIS — Z8679 Personal history of other diseases of the circulatory system: Secondary | ICD-10-CM | POA: Diagnosis not present

## 2022-07-11 DIAGNOSIS — I340 Nonrheumatic mitral (valve) insufficiency: Secondary | ICD-10-CM | POA: Diagnosis not present

## 2022-07-11 DIAGNOSIS — I517 Cardiomegaly: Secondary | ICD-10-CM | POA: Diagnosis not present

## 2022-07-11 DIAGNOSIS — R2689 Other abnormalities of gait and mobility: Secondary | ICD-10-CM | POA: Diagnosis not present

## 2022-07-11 DIAGNOSIS — Z7982 Long term (current) use of aspirin: Secondary | ICD-10-CM | POA: Diagnosis not present

## 2022-07-12 DIAGNOSIS — Z95810 Presence of automatic (implantable) cardiac defibrillator: Secondary | ICD-10-CM | POA: Diagnosis not present

## 2022-07-12 DIAGNOSIS — R2689 Other abnormalities of gait and mobility: Secondary | ICD-10-CM | POA: Diagnosis not present

## 2022-07-12 DIAGNOSIS — Z7982 Long term (current) use of aspirin: Secondary | ICD-10-CM | POA: Diagnosis not present

## 2022-07-12 DIAGNOSIS — R42 Dizziness and giddiness: Secondary | ICD-10-CM | POA: Diagnosis not present

## 2022-07-12 DIAGNOSIS — Z8679 Personal history of other diseases of the circulatory system: Secondary | ICD-10-CM | POA: Diagnosis not present

## 2022-07-13 DIAGNOSIS — R2689 Other abnormalities of gait and mobility: Secondary | ICD-10-CM | POA: Diagnosis not present

## 2022-07-13 DIAGNOSIS — Z95810 Presence of automatic (implantable) cardiac defibrillator: Secondary | ICD-10-CM | POA: Diagnosis not present

## 2022-07-13 DIAGNOSIS — R42 Dizziness and giddiness: Secondary | ICD-10-CM | POA: Diagnosis not present

## 2022-07-13 DIAGNOSIS — Z7982 Long term (current) use of aspirin: Secondary | ICD-10-CM | POA: Diagnosis not present

## 2022-07-13 DIAGNOSIS — Z8679 Personal history of other diseases of the circulatory system: Secondary | ICD-10-CM | POA: Diagnosis not present

## 2022-07-14 DIAGNOSIS — I10 Essential (primary) hypertension: Secondary | ICD-10-CM | POA: Diagnosis not present

## 2022-07-15 DIAGNOSIS — Z7982 Long term (current) use of aspirin: Secondary | ICD-10-CM | POA: Diagnosis not present

## 2022-07-15 DIAGNOSIS — Z9181 History of falling: Secondary | ICD-10-CM | POA: Diagnosis not present

## 2022-07-15 DIAGNOSIS — Z87891 Personal history of nicotine dependence: Secondary | ICD-10-CM | POA: Diagnosis not present

## 2022-07-15 DIAGNOSIS — M5135 Other intervertebral disc degeneration, thoracolumbar region: Secondary | ICD-10-CM | POA: Diagnosis not present

## 2022-07-15 DIAGNOSIS — Z95810 Presence of automatic (implantable) cardiac defibrillator: Secondary | ICD-10-CM | POA: Diagnosis not present

## 2022-07-15 DIAGNOSIS — K760 Fatty (change of) liver, not elsewhere classified: Secondary | ICD-10-CM | POA: Diagnosis not present

## 2022-07-15 DIAGNOSIS — D649 Anemia, unspecified: Secondary | ICD-10-CM | POA: Diagnosis not present

## 2022-07-15 DIAGNOSIS — I493 Ventricular premature depolarization: Secondary | ICD-10-CM | POA: Diagnosis not present

## 2022-07-15 DIAGNOSIS — Z79899 Other long term (current) drug therapy: Secondary | ICD-10-CM | POA: Diagnosis not present

## 2022-07-15 DIAGNOSIS — I428 Other cardiomyopathies: Secondary | ICD-10-CM | POA: Diagnosis not present

## 2022-07-15 DIAGNOSIS — I351 Nonrheumatic aortic (valve) insufficiency: Secondary | ICD-10-CM | POA: Diagnosis not present

## 2022-07-15 DIAGNOSIS — Z87442 Personal history of urinary calculi: Secondary | ICD-10-CM | POA: Diagnosis not present

## 2022-07-15 DIAGNOSIS — M797 Fibromyalgia: Secondary | ICD-10-CM | POA: Diagnosis not present

## 2022-07-15 DIAGNOSIS — I110 Hypertensive heart disease with heart failure: Secondary | ICD-10-CM | POA: Diagnosis not present

## 2022-07-15 DIAGNOSIS — I5022 Chronic systolic (congestive) heart failure: Secondary | ICD-10-CM | POA: Diagnosis not present

## 2022-07-15 DIAGNOSIS — E782 Mixed hyperlipidemia: Secondary | ICD-10-CM | POA: Diagnosis not present

## 2022-07-15 DIAGNOSIS — R7303 Prediabetes: Secondary | ICD-10-CM | POA: Diagnosis not present

## 2022-07-16 ENCOUNTER — Telehealth: Payer: Self-pay | Admitting: *Deleted

## 2022-07-16 ENCOUNTER — Other Ambulatory Visit: Payer: Self-pay | Admitting: *Deleted

## 2022-07-16 ENCOUNTER — Telehealth: Payer: Self-pay

## 2022-07-16 DIAGNOSIS — I10 Essential (primary) hypertension: Secondary | ICD-10-CM

## 2022-07-16 NOTE — Telephone Encounter (Signed)
[name] Alice Stewart-[phone]  Would like a verbal orders for physical therapy  Onset: once a week for 4 weeks   30 min session each Reason:Back and legs  strengthening  And fall precautions.after recent visit to ED  Fax:[phone]

## 2022-07-16 NOTE — Patient Outreach (Signed)
  Care Coordination TOC Note Transition Care Management Follow-up Telephone Call Date of discharge and from where: 07/13/22 [facility] How have you been since you were released from the hospital? Patient states she is better but continues to have intermittent dizziness and her B/P continues to run higher than usual. Adding that the [facility] nurse got lower readings and she feels that her B/P monitor may not be accurate. Any questions or concerns? No  Items Reviewed: Did the pt receive and understand the discharge instructions provided? Yes  Medications obtained and verified? No  Other? No  Any new allergies since your discharge? No  Dietary orders reviewed? Yes Do you have support at home? Yes   Home Care and Equipment/Supplies: Were home health services ordered? yes If so, what is the name of the agency? [facility]  Has the agency set up a time to come to the patient's home? yes Were any new equipment or medical supplies ordered?  Yes: rolling walker and BSC What is the name of the medical supply agency? Was delivered to her at the hospital Were you able to get the supplies/equipment? yes Do you have any questions related to the use of the equipment or supplies? No  Functional Questionnaire: (I = Independent and D = Dependent) ADLs: I  Bathing/Dressing- I  Meal Prep- I  Eating- I  Maintaining continence- I  Transferring/Ambulation- I  Managing Meds- I  Follow up appointments reviewed:  PCP Hospital f/u appt confirmed? No  Nurse will notify Steffanie Cairrkier Ignacia Palma via email Specialist [facility] f/u appt confirmed? Yes  Scheduled to see Dr. Lisabeth Devoid cardiology on 07/23/22 @ 1445. Are transportation arrangements needed? Yes  If their condition worsens, is the pt aware to call PCP or go to the Emergency Dept.? Yes Was the patient provided with contact information for the PCP's office or ED? Yes Was to pt encouraged to call back with questions or concerns?  Yes  SDOH assessments and interventions completed:   Yes  Care Coordination Interventions Activated:  Yes Care Coordination Interventions:  Referred for Care Coordination Services:  RN Care Coordinator, PCP f/u appointment requested, Transportation and financial restrains concerns  Encounter Outcome:  Pt. Visit Completed  Blanchie Serve RN, BSN [facility] Care Management [facility] [phone] [email or web address]

## 2022-07-16 NOTE — Telephone Encounter (Signed)
Okay to give verbal orders for the above requested.

## 2022-07-16 NOTE — Telephone Encounter (Signed)
   Telephone encounter was:  Successful.  07/16/2022 Name: Alice Stewart MRN: [id number] DOB: 07/27/1956  Alice Stewart is a 66 y.o. year old female who is a primary care patient of Christen Butter, NP . The community resource team was consulted for assistance with Patient in [place] needs food banks to deliver and also transportation to grocery stores and other places , will research as well as initiate a moms meals paperwork from hospital visit   Care guide performed the following interventions: Patient provided with information about care guide support team and interviewed to confirm resource needs Discussed resources to assist with Food  Obtained verbal consent to place patient referral to food banks, moms meals and MOW .  Follow Up Plan:  Care guide will follow up with patient by phone over the next days  Alois Cliche -[facility] Guide , Embedded Care Coordination [facility], Care Management  [phone] [address] [place] , [place] [address] Email : [email or web address]

## 2022-07-17 DIAGNOSIS — I10 Essential (primary) hypertension: Secondary | ICD-10-CM | POA: Diagnosis not present

## 2022-07-17 NOTE — Telephone Encounter (Signed)
Reviewed ED record, echocardiogram was reassuring, does have moderate aortic regurgitation but this should not cause any symptom. BP control remain very challenging due to multiple intolerances to medication. ED record mentioned she tolerated labetalol. BP 147/67 which is probably the best blood pressure I have seen out of Mrs. Alice Stewart. Will discuss treatment further on follow up 8/1

## 2022-07-18 ENCOUNTER — Encounter: Payer: Self-pay | Admitting: *Deleted

## 2022-07-18 DIAGNOSIS — I10 Essential (primary) hypertension: Secondary | ICD-10-CM | POA: Diagnosis not present

## 2022-07-18 DIAGNOSIS — R42 Dizziness and giddiness: Secondary | ICD-10-CM | POA: Diagnosis not present

## 2022-07-19 ENCOUNTER — Ambulatory Visit (INDEPENDENT_AMBULATORY_CARE_PROVIDER_SITE_OTHER): Payer: Medicare Other

## 2022-07-19 DIAGNOSIS — I428 Other cardiomyopathies: Secondary | ICD-10-CM | POA: Diagnosis not present

## 2022-07-19 LAB — CUP PACEART REMOTE DEVICE CHECK
Battery Remaining Longevity: 86 mo
Battery Remaining Percentage: 93 %
Battery Voltage: 3.01 V
Brady Statistic AP VP Percent: 26 %
Brady Statistic AP VS Percent: 1 %
Brady Statistic AS VP Percent: 74 %
Brady Statistic AS VS Percent: 1 %
Brady Statistic RA Percent Paced: 25 %
Date Time Interrogation Session: 20230728023204
HighPow Impedance: 62 Ohm
Implantable Lead Implant Date: 20150303
Implantable Lead Implant Date: 20150303
Implantable Lead Implant Date: 20150303
Implantable Lead Location: 753858
Implantable Lead Location: 753859
Implantable Lead Location: 753860
Implantable Pulse Generator Implant Date: 20230127
Lead Channel Impedance Value: 430 Ohm
Lead Channel Impedance Value: 460 Ohm
Lead Channel Impedance Value: 590 Ohm
Lead Channel Pacing Threshold Amplitude: 0.875 V
Lead Channel Pacing Threshold Amplitude: 1 V
Lead Channel Pacing Threshold Amplitude: 1.5 V
Lead Channel Pacing Threshold Pulse Width: 0.5 ms
Lead Channel Pacing Threshold Pulse Width: 0.5 ms
Lead Channel Pacing Threshold Pulse Width: 0.7 ms
Lead Channel Sensing Intrinsic Amplitude: 12 mV
Lead Channel Sensing Intrinsic Amplitude: 4.9 mV
Lead Channel Setting Pacing Amplitude: 1.875
Lead Channel Setting Pacing Amplitude: 2 V
Lead Channel Setting Pacing Amplitude: 2 V
Lead Channel Setting Pacing Pulse Width: 0.5 ms
Lead Channel Setting Pacing Pulse Width: 0.7 ms
Lead Channel Setting Sensing Sensitivity: 0.5 mV
Pulse Gen Serial Number: 111057141

## 2022-07-20 DIAGNOSIS — I10 Essential (primary) hypertension: Secondary | ICD-10-CM | POA: Diagnosis not present

## 2022-07-22 ENCOUNTER — Other Ambulatory Visit: Payer: Self-pay

## 2022-07-22 ENCOUNTER — Telehealth: Payer: Self-pay | Admitting: *Deleted

## 2022-07-22 DIAGNOSIS — K760 Fatty (change of) liver, not elsewhere classified: Secondary | ICD-10-CM | POA: Diagnosis not present

## 2022-07-22 DIAGNOSIS — I493 Ventricular premature depolarization: Secondary | ICD-10-CM | POA: Diagnosis not present

## 2022-07-22 DIAGNOSIS — M5135 Other intervertebral disc degeneration, thoracolumbar region: Secondary | ICD-10-CM | POA: Diagnosis not present

## 2022-07-22 DIAGNOSIS — Z87442 Personal history of urinary calculi: Secondary | ICD-10-CM | POA: Diagnosis not present

## 2022-07-22 DIAGNOSIS — E782 Mixed hyperlipidemia: Secondary | ICD-10-CM | POA: Diagnosis not present

## 2022-07-22 DIAGNOSIS — Z95810 Presence of automatic (implantable) cardiac defibrillator: Secondary | ICD-10-CM | POA: Diagnosis not present

## 2022-07-22 DIAGNOSIS — M797 Fibromyalgia: Secondary | ICD-10-CM | POA: Diagnosis not present

## 2022-07-22 DIAGNOSIS — I110 Hypertensive heart disease with heart failure: Secondary | ICD-10-CM | POA: Diagnosis not present

## 2022-07-22 DIAGNOSIS — Z7982 Long term (current) use of aspirin: Secondary | ICD-10-CM | POA: Diagnosis not present

## 2022-07-22 DIAGNOSIS — Z79899 Other long term (current) drug therapy: Secondary | ICD-10-CM | POA: Diagnosis not present

## 2022-07-22 DIAGNOSIS — I5022 Chronic systolic (congestive) heart failure: Secondary | ICD-10-CM | POA: Diagnosis not present

## 2022-07-22 DIAGNOSIS — I351 Nonrheumatic aortic (valve) insufficiency: Secondary | ICD-10-CM | POA: Diagnosis not present

## 2022-07-22 DIAGNOSIS — R7303 Prediabetes: Secondary | ICD-10-CM | POA: Diagnosis not present

## 2022-07-22 DIAGNOSIS — Z9181 History of falling: Secondary | ICD-10-CM | POA: Diagnosis not present

## 2022-07-22 DIAGNOSIS — Z87891 Personal history of nicotine dependence: Secondary | ICD-10-CM | POA: Diagnosis not present

## 2022-07-22 DIAGNOSIS — D649 Anemia, unspecified: Secondary | ICD-10-CM | POA: Diagnosis not present

## 2022-07-22 DIAGNOSIS — I428 Other cardiomyopathies: Secondary | ICD-10-CM | POA: Diagnosis not present

## 2022-07-22 NOTE — Telephone Encounter (Signed)
   Telephone encounter was:  Unsuccessful.  07/22/2022 Name: Alice Stewart MRN: [id number] DOB: Feb 04, 1956  Unsuccessful outbound call made today to assist with:  Transportation Needs  and Food Insecurity  Outreach Attempt:  2nd Attempt  A HIPAA compliant voice message was left requesting a return call.  Instructed patient to call back at   Instructed patient to call back at [phone]  at their earliest convenience. Yehuda Mao Greenauer -[facility] Guide , Embedded Care Coordination [facility], Care Management  [phone] [address] [place] , [place] [address] Email : [email or web address]

## 2022-07-22 NOTE — Patient Outreach (Signed)
  Care Coordination   Initial Visit Note   07/22/2022 Name: JENAYE RICKERT MRN: [id number] DOB: January 01, 1956  REHANA UNCAPHER is a 66 y.o. year old female who sees Christen Butter, NP for primary care. RNCM Called to complete initial assessment post TOC call. No answer. HIPAA compliant message left.  Follow up plan:  Care Management Team will outreach again within the next 30 days.  Encounter Outcome:  No Answer  Kathyrn Sheriff, RN, MSN, BSN, CCM Care Management Coordinator [phone]

## 2022-07-23 ENCOUNTER — Ambulatory Visit (INDEPENDENT_AMBULATORY_CARE_PROVIDER_SITE_OTHER): Payer: Medicare Other | Admitting: Physician Assistant

## 2022-07-23 ENCOUNTER — Encounter: Payer: Self-pay | Admitting: Physician Assistant

## 2022-07-23 VITALS — BP 148/72 | HR 60 | Ht 64.0 in | Wt 250.0 lb

## 2022-07-23 DIAGNOSIS — I428 Other cardiomyopathies: Secondary | ICD-10-CM

## 2022-07-23 DIAGNOSIS — Z95810 Presence of automatic (implantable) cardiac defibrillator: Secondary | ICD-10-CM

## 2022-07-23 DIAGNOSIS — I447 Left bundle-branch block, unspecified: Secondary | ICD-10-CM | POA: Diagnosis not present

## 2022-07-23 DIAGNOSIS — I10 Essential (primary) hypertension: Secondary | ICD-10-CM

## 2022-07-23 MED ORDER — LABETALOL HCL 300 MG PO TABS: 600.0000 mg | ORAL_TABLET | Freq: Two times a day (BID) | ORAL | 1 refills | Status: DC

## 2022-07-23 MED ORDER — LOSARTAN POTASSIUM 100 MG PO TABS: 100.0000 mg | ORAL_TABLET | Freq: Every day | ORAL | 1 refills | Status: DC

## 2022-07-23 NOTE — Progress Notes (Unsigned)
Cardiology Office Note:    Date:  07/25/2022   ID:  MYCHAEL BIERNAT, DOB 10/09/1956, MRN [id number]  PCP:  Samuel Bouche, NP   [facility] Cardiologist:  Kirk Ruths, MD Electrophysiologist:  Will Meredith Leeds, MD     Referring MD: Samuel Bouche, NP   Chief Complaint  Patient presents with   Follow-up    Seen for Dr. Stanford Breed    History of Present Illness:    Alice Stewart is a 66 y.o. female with a hx of left bundle branch block, NICM s/p St Jude CRT-D in 2015 with device change out Jan 2023, HTN, HLD and hypothyroidism.  Patient was previously followed by Dr. Marshall Cork in [place].  She has a history of EF as low as 25 to 30% with left bundle branch block in 2014.  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   CTA in July 2021 showed minimally nonobstructive CAD with calcium score 34.  Echocardiogram in May 2021 showed EF has improved to 55 to 60%.  Her PVCs are now treated with flecainide.  Follow-up echocardiogram in September 2022 showed EF 45 to 50%, moderate LVH, grade 1 DD, mild MR.  She had ICD generator change out in January 2023.  She did not tolerate carvedilol, Toprol and Entresto.  She is intolerant of statins as well.  Patient was last seen by Dr. Stanford Breed in June 2023 at which time she was doing well.  I last saw the patient on 07/02/2021 for dizziness, headache, poor memory and occasional chest discomfort.  I reviewed the previous coronary CT obtained on 06/29/2020 at outside facility that showed calcium score of 31 which placed the patient on 77th percentile for age and sex matched control, all of the calcium were present in the LAD territory, minimal luminal narrowing in the LAD, otherwise no significant plaque.  Her chest pain was atypical, I did not recommend additional ischemic evaluation.  Blood pressure however was quite uncontrolled.  She believes she was only able to tolerate the 160 mg twice a day of  valsartan.  Looking back, she was previously on 360 mg daily however this was split in half by Dr. Curt Bears in May 2023 with instruction to take half in the morning and half at night.  She says her valsartan was interfering with her memory.  Blood pressure was elevated in the office, and at home her blood pressure frequently peaks over 200 range.  Unfortunately she has tried multiple medication in the past including carvedilol, Bystolic, chlorthalidone, amlodipine, lisinopril, metoprolol, and by her report spironolactone as well.  I did not wish to consider clonidine in this patient given potential rebound hypertension if she does stop taking the medication.  We decided to try hydralazine, which she could not tolerate.  I gave her a prescription for Cardura unfortunately she could not tolerate that either.  She presented to [facility] ED on 07/10/2022 due to dizziness and elevated blood pressure.  She also mention she was having diarrhea for 3 days with nausea and a headache.  Echocardiogram obtained in the ED showed EF 55 to 60%, mild LVH, normal RV systolic function and RV size, moderate AI, mild MR.  Orthostatic vital sign was negative.  CTA of the head and neck showed no acute abnormality.  It was felt dizziness may be related to the Cardura.  She apparently allowed ED physician to give her labetalol and the Lasix, blood pressure did  improve to 140s.  She was given labetalol 600 mg twice a day.  However instead of valsartan, it appears she was changed to losartan 100 mg daily.  Hydrochlorothiazide was discontinued.  Patient presents today for follow-up.  She was initially placed on 40 mg daily of Lasix, however she could not tolerate going to the bathroom so frequently, therefore she has cut her Lasix back down to 20 mg daily.  She does not have significant lower extremity edema, she has is not significantly volume overloaded.  Ever since starting on labetalol and losartan combination, her blood pressure  has been in the 140s which is the best blood pressure I have ever seen out of her.  She says she still feels fatigued during the day and now has occasional dizziness when she lays down too quickly, however she is tolerating the current combination of the medication.  I recommended continue on the current therapy as it appears we have reached a delicate balance between her symptom and blood pressure control.  I discussed the recent echocardiogram with her.  She can follow-up with Dr. Stanford Breed as previously scheduled.  Past Medical History:  Diagnosis Date   Anemia    Back pain    Benign positional vertigo    Cardiomyopathy ([employer] [employer])    Carpal tunnel syndrome    Goiter    nontoxid mutinodular   Hypertension    Hyperthyroidism    Insulin resistance syndrome    Menopausal disorder    Menorrhagia    Migraine    Muscle spasm    trapezius   Neck pain, acute    Obesity    Seborrheic dermatitis    Thyroid disorder     Past Surgical History:  Procedure Laterality Date   DILATION AND CURETTAGE OF UTERUS  04/09   ENDOMETRIAL ABLATION  04/09   GREAT TOE ARTHRODESIS, METATARSALPHALANGEAL JOINT  6/07   Rt foot   HEEL SPUR SURGERY  11/87   Lt foot   ICD GENERATOR CHANGEOUT N/A 01/18/2022   Procedure: ICD GENERATOR CHANGEOUT;  Surgeon: Constance Haw, MD;  Location: [place];  Service: Cardiovascular;  Laterality: N/A;   TUBAL LIGATION  11/90    Current Medications: Current Meds  Medication Sig   Ascorbic Acid (VITAMIN C) 1000 MG tablet Take 1,000 mg by mouth daily.     aspirin 81 MG EC tablet Take 81 mg by mouth daily.   Bioflavonoid Products (BIOFLEX PO) Take 2 tablets by mouth daily.   Biotin 10000 MCG TABS Take 10,000 mcg by mouth daily.   Calcium Carb-Cholecalciferol 600-20 MG-MCG TABS Take 1 tablet by mouth daily.   clobetasol (TEMOVATE) 0.05 % external solution Apply topically 2 (two) times daily.   COLLAGEN PO Take 3 capsules by mouth daily.   ferrous sulfate 325 (65  FE) MG tablet Take 325 mg by mouth every other day.   flecainide (TAMBOCOR) 100 MG tablet Take 1 tablet (100 mg total) by mouth 2 (two) times daily.   Fluocinolone Acetonide Scalp (DERMA-SMOOTHE/FS SCALP) 0.01 % OIL Apply 1 application topically daily.   furosemide (LASIX) 20 MG tablet Take 1 tablet (20 mg total) by mouth daily.   Multiple Minerals-Vitamins (GNP CAL MAG ZINC +D3 PO) Take 3 tablets by mouth daily.   Multiple Vitamin (MULTIVITAMIN) tablet Take 1 tablet by mouth daily.     naproxen (NAPROSYN) 500 MG tablet Take 1 tablet (500 mg total) by mouth 2 (two) times daily as needed.   NON FORMULARY Take by  mouth daily. 2 tablespoons olive oil   OVER THE COUNTER MEDICATION Take 1,000-2,000 mg by mouth daily. Dong quai   Red Clover Leaf Extract 500 MG TABS Take 500 mg by mouth daily.   vitamin B-12 (CYANOCOBALAMIN) 1000 MCG tablet Take 1,000 mcg by mouth daily.   vitamin E 180 MG (400 UNITS) capsule Take 400 Units by mouth daily.   [DISCONTINUED] labetalol (NORMODYNE) 300 MG tablet Take by mouth.   [DISCONTINUED] losartan (COZAAR) 100 MG tablet Take 1 tablet by mouth daily.     Allergies:   Bystolic [nebivolol hcl], Coreg [carvedilol], Estradiol, Amlodipine-atorvastatin, Chlorthalidone, Diltiazem hcl, Lisinopril, Metoprolol tartrate, and Simvastatin   Social History   Socioeconomic History   Marital status: Divorced    Spouse name: Not on file   Number of children: 3   Years of education: 14   Highest education level: Not on file  Occupational History   Occupation: unemployed    Employer: [employer]  Tobacco Use   Smoking status: Former    Types: Cigarettes    Quit date: 09/22/2004    Years since quitting: 17.8   Smokeless tobacco: Never  Vaping Use   Vaping Use: Never used  Substance and Sexual Activity   Alcohol use: Yes    Alcohol/week: 1.0 standard drink of alcohol    Types: 1 Standard drinks or equivalent per week    Comment: 1 weekly   Drug use: No   Sexual activity:  Yes    Birth control/protection: Post-menopausal  Other Topics Concern   Not on file  Social History Narrative   3-4 caffeinated drinks per day   No regular exercise   Social Determinants of Health   Financial Resource Strain: Low Risk  (08/25/2021)   Overall Financial Resource Strain (CARDIA)    Difficulty of Paying Living Expenses: Not hard at all  Food Insecurity: Food Insecurity Present (07/16/2022)   Hunger Vital Sign    Worried About Running Out of Food in the Last Year: Sometimes true    Ran Out of Food in the Last Year: Sometimes true  Transportation Needs: No Transportation Needs (07/16/2022)   PRAPARE - Administrator, Civil Service (Medical): No    Lack of Transportation (Non-Medical): No  Physical Activity: Sufficiently Active (08/25/2021)   Exercise Vital Sign    Days of Exercise per Week: 5 days    Minutes of Exercise per Session: 30 min  Stress: No Stress Concern Present (07/16/2022)   [employer] of Occupational Health - Occupational Stress Questionnaire    Feeling of Stress : Only a little  Social Connections: Moderately Isolated (08/25/2021)   Social Connection and Isolation Panel [NHANES]    Frequency of Communication with Friends and Family: More than three times a week    Frequency of Social Gatherings with Friends and Family: More than three times a week    Attends Religious Services: 1 to 4 times per year    Active Member of [employer] or Organizations: No    Attends Banker Meetings: Never    Marital Status: Divorced     Family History: The patient's family history includes Diabetes in an other family member; Hypertension in her father and sister; Hypothyroidism in her sister; Other in her mother.  ROS:   Please see the history of present illness.     All other systems reviewed and are negative.  EKGs/Labs/Other Studies Reviewed:    The following studies were reviewed today:  Echo 09/03/2021 1. Left ventricular ejection  fraction, by estimation, is 45 to 50%. The  left ventricle has mildly decreased function. The left ventricle has no  regional wall motion abnormalities. There is moderate left ventricular  hypertrophy. Left ventricular  diastolic parameters are consistent with Grade I diastolic dysfunction  (impaired relaxation).   2. Right ventricular systolic function is normal. The right ventricular  size is normal.   3. The mitral valve is normal in structure. Mild mitral valve  regurgitation. No evidence of mitral stenosis.   4. The aortic valve is normal in structure. Aortic valve regurgitation is  mild. No aortic stenosis is present.   5. The inferior vena cava is normal in size with greater than 50%  respiratory variability, suggesting right atrial pressure of 3 mmHg.   Comparison(s): Echo from outside facility May 2021, LVEF 55-60%.   EKG:  EKG is not ordered today.    Recent Labs: 01/08/2022: Hemoglobin 14.6; Platelets 333 02/04/2022: BUN 20; Creat 1.13; Potassium 3.9; Sodium 141 04/11/2022: TSH 1.89  Recent Lipid Panel    Component Value Date/Time   CHOL 225 (H) 04/11/2022 1601   TRIG 173 (H) 04/11/2022 1601   HDL 49 (L) 04/11/2022 1601   CHOLHDL 4.6 04/11/2022 1601   VLDL 22 03/11/2007 1943   LDLCALC 145 (H) 04/11/2022 1601     Risk Assessment/Calculations:           Physical Exam:    VS:  BP (!) 148/72 (BP Location: Right Arm, Patient Position: Sitting, Cuff Size: Large)   Pulse 60   Ht 5\' 4"  (1.626 m)   Wt 250 lb (113.4 kg)   LMP 09/27/2011   SpO2 98%   BMI 42.91 kg/m        Wt Readings from Last 3 Encounters:  07/23/22 250 lb (113.4 kg)  07/02/22 247 lb (112 kg)  07/01/22 249 lb (112.9 kg)     GEN:  Well nourished, well developed in no acute distress HEENT: Normal NECK: No JVD; No carotid bruits LYMPHATICS: No lymphadenopathy CARDIAC: RRR, no murmurs, rubs, gallops RESPIRATORY:  Clear to auscultation without rales, wheezing or rhonchi  ABDOMEN: Soft,  non-tender, non-distended MUSCULOSKELETAL:  No edema; No deformity  SKIN: Warm and dry NEUROLOGIC:  Alert and oriented x 3 PSYCHIATRIC:  Normal affect   ASSESSMENT:    1. HYPERTENSION, BENIGN SYSTEMIC   2. NICM (nonischemic cardiomyopathy) (HCC)   3. Presence of cardiac resynchronization therapy defibrillator (CRT-D)   4. LBBB (left bundle branch block)    PLAN:    In order of problems listed above:  Hypertension: Previously uncontrolled for a long time due to intolerance of many different drugs.  Recently, we have tried spironolactone and Cardura, she did not tolerate either one, in fact she became so dizzy she went to the ED.  While in the emergency room, her valsartan was discontinued and she was placed on combination of labetalol and losartan.  She is currently on 600 mg twice a day of labetalol and 100 mg daily of losartan.  Her blood pressure is now in the 140s which is the best blood pressure I have seen out of her in a long time.  I am hesitant to further increase her medication as she has a history of dizziness and significant fatigue with higher dose of blood pressure medications that may prompt her to stop all the previous medications.  I think we have reached a delicate balance.  For now I will tolerate her current blood pressure.  Nonischemic cardiomyopathy: Euvolemic on exam.  EF almost normalized at 45 to 50% on last echocardiogram in September 2022.  CRT-D: Generator change out in January.  Managed by EP service  History of LBBB: Has CRT-D           Medication Adjustments/Labs and Tests Ordered: Current medicines are reviewed at length with the patient today.  Concerns regarding medicines are outlined above.  No orders of the defined types were placed in this encounter.  Meds ordered this encounter  Medications   labetalol (NORMODYNE) 300 MG tablet    Sig: Take 2 tablets (600 mg total) by mouth 2 (two) times daily.    Dispense:  360 tablet    Refill:  1    losartan (COZAAR) 100 MG tablet    Sig: Take 1 tablet (100 mg total) by mouth daily.    Dispense:  90 tablet    Refill:  1    Patient Instructions  Medication Instructions:  Your physician recommends that you continue on your current medications as directed. Please refer to the Current Medication list given to you today.  *If you need a refill on your cardiac medications before your next appointment, please call your pharmacy*   Lab Work: NONE If you have labs (blood work) drawn today and your tests are completely normal, you will receive your results only by: MyChart Message (if you have MyChart) OR A paper copy in the mail If you have any lab test that is abnormal or we need to change your treatment, we will call you to review the results.   Testing/Procedures: NONE   Follow-Up: At [facility], you and your health needs are our priority.  As part of our continuing mission to provide you with exceptional heart care, we have created designated Provider Care Teams.  These Care Teams include your primary Cardiologist (physician) and Advanced Practice Providers (APPs -  Physician Assistants and Nurse Practitioners) who all work together to provide you with the care you need, when you need it.  We recommend signing up for the patient portal called "MyChart".  Sign up information is provided on this After Visit Summary.  MyChart is used to connect with patients for Virtual Visits (Telemedicine).  Patients are able to view lab/test results, encounter notes, upcoming appointments, etc.  Non-urgent messages can be sent to your provider as well.   To learn more about what you can do with MyChart, go to [email or web address].    Other Instructions Follow up as scheduled with Dr. Jens Som  Important Information About Sugar         Signed, Azalee Course, PA  07/25/2022 3:29 PM    [facility]

## 2022-07-23 NOTE — Patient Instructions (Signed)
Medication Instructions:  Your physician recommends that you continue on your current medications as directed. Please refer to the Current Medication list given to you today.  *If you need a refill on your cardiac medications before your next appointment, please call your pharmacy*   Lab Work: NONE If you have labs (blood work) drawn today and your tests are completely normal, you will receive your results only by: MyChart Message (if you have MyChart) OR A paper copy in the mail If you have any lab test that is abnormal or we need to change your treatment, we will call you to review the results.   Testing/Procedures: NONE   Follow-Up: At [facility], you and your health needs are our priority.  As part of our continuing mission to provide you with exceptional heart care, we have created designated Provider Care Teams.  These Care Teams include your primary Cardiologist (physician) and Advanced Practice Providers (APPs -  Physician Assistants and Nurse Practitioners) who all work together to provide you with the care you need, when you need it.  We recommend signing up for the patient portal called "MyChart".  Sign up information is provided on this After Visit Summary.  MyChart is used to connect with patients for Virtual Visits (Telemedicine).  Patients are able to view lab/test results, encounter notes, upcoming appointments, etc.  Non-urgent messages can be sent to your provider as well.   To learn more about what you can do with MyChart, go to [email or web address].    Other Instructions Follow up as scheduled with Dr. Jens Som  Important Information About Sugar

## 2022-07-25 ENCOUNTER — Encounter: Payer: Self-pay | Admitting: Physician Assistant

## 2022-07-29 DIAGNOSIS — M5135 Other intervertebral disc degeneration, thoracolumbar region: Secondary | ICD-10-CM | POA: Diagnosis not present

## 2022-07-29 DIAGNOSIS — E782 Mixed hyperlipidemia: Secondary | ICD-10-CM | POA: Diagnosis not present

## 2022-07-29 DIAGNOSIS — Z87891 Personal history of nicotine dependence: Secondary | ICD-10-CM | POA: Diagnosis not present

## 2022-07-29 DIAGNOSIS — M797 Fibromyalgia: Secondary | ICD-10-CM | POA: Diagnosis not present

## 2022-07-29 DIAGNOSIS — Z87442 Personal history of urinary calculi: Secondary | ICD-10-CM | POA: Diagnosis not present

## 2022-07-29 DIAGNOSIS — I493 Ventricular premature depolarization: Secondary | ICD-10-CM | POA: Diagnosis not present

## 2022-07-29 DIAGNOSIS — Z79899 Other long term (current) drug therapy: Secondary | ICD-10-CM | POA: Diagnosis not present

## 2022-07-29 DIAGNOSIS — I351 Nonrheumatic aortic (valve) insufficiency: Secondary | ICD-10-CM | POA: Diagnosis not present

## 2022-07-29 DIAGNOSIS — I428 Other cardiomyopathies: Secondary | ICD-10-CM | POA: Diagnosis not present

## 2022-07-29 DIAGNOSIS — R7303 Prediabetes: Secondary | ICD-10-CM | POA: Diagnosis not present

## 2022-07-29 DIAGNOSIS — I5022 Chronic systolic (congestive) heart failure: Secondary | ICD-10-CM | POA: Diagnosis not present

## 2022-07-29 DIAGNOSIS — I110 Hypertensive heart disease with heart failure: Secondary | ICD-10-CM | POA: Diagnosis not present

## 2022-07-29 DIAGNOSIS — K760 Fatty (change of) liver, not elsewhere classified: Secondary | ICD-10-CM | POA: Diagnosis not present

## 2022-07-29 DIAGNOSIS — D649 Anemia, unspecified: Secondary | ICD-10-CM | POA: Diagnosis not present

## 2022-07-29 DIAGNOSIS — Z9181 History of falling: Secondary | ICD-10-CM | POA: Diagnosis not present

## 2022-07-29 DIAGNOSIS — Z7982 Long term (current) use of aspirin: Secondary | ICD-10-CM | POA: Diagnosis not present

## 2022-07-29 DIAGNOSIS — Z95810 Presence of automatic (implantable) cardiac defibrillator: Secondary | ICD-10-CM | POA: Diagnosis not present

## 2022-07-30 ENCOUNTER — Telehealth: Payer: Self-pay

## 2022-07-30 NOTE — Telephone Encounter (Signed)
She will need an appointment.  We need to find out why she wants to see rheumatology as well as do some preliminary workup so they can evaluate whether her case is appropriate to accept.

## 2022-07-30 NOTE — Telephone Encounter (Signed)
Patient called wanting a referral to Rheumatology.

## 2022-07-31 ENCOUNTER — Telehealth: Payer: Self-pay | Admitting: *Deleted

## 2022-07-31 NOTE — Telephone Encounter (Signed)
Patient is scheduled with Dr. Lorenda Peck on 08/05/2022 at 1:45 PM

## 2022-08-01 NOTE — Telephone Encounter (Signed)
   Telephone encounter was:  Successful.  08/01/2022 Name: MEHAR SAGEN MRN: [id number] DOB: [birth year]  LAMOYNE PALENCIA is a 65 y.o. year old female who is a primary care patient of Christen Butter, NP . The community resource team was consulted for assistance with Transportation Needs  and Food Insecurity  Care guide performed the following interventions: Patient provided with information about care guide support team and interviewed to confirm resource needs Discussed resources to assist with food  Placed referral to senior resources  via phone for MOW .  Follow Up Plan:  No further follow up planned at this time. The patient has been provided with needed resources.  Yehuda Mao Greenauer -[facility] [place], Population Health [phone] [address] [place] , [place] [address] Email : [email or web address]

## 2022-08-05 ENCOUNTER — Ambulatory Visit (INDEPENDENT_AMBULATORY_CARE_PROVIDER_SITE_OTHER): Payer: Medicare Other

## 2022-08-05 ENCOUNTER — Ambulatory Visit (INDEPENDENT_AMBULATORY_CARE_PROVIDER_SITE_OTHER): Payer: Medicare Other | Admitting: Sports Medicine

## 2022-08-05 DIAGNOSIS — M25551 Pain in right hip: Secondary | ICD-10-CM

## 2022-08-05 DIAGNOSIS — M2578 Osteophyte, vertebrae: Secondary | ICD-10-CM | POA: Diagnosis not present

## 2022-08-05 DIAGNOSIS — G8929 Other chronic pain: Secondary | ICD-10-CM | POA: Diagnosis not present

## 2022-08-05 DIAGNOSIS — R531 Weakness: Secondary | ICD-10-CM | POA: Diagnosis not present

## 2022-08-05 DIAGNOSIS — M5136 Other intervertebral disc degeneration, lumbar region: Secondary | ICD-10-CM | POA: Diagnosis not present

## 2022-08-05 MED ORDER — PREDNISONE 50 MG PO TABS: ORAL_TABLET | ORAL | 0 refills | Status: DC

## 2022-08-05 NOTE — Progress Notes (Addendum)
    Procedures performed today:    None.  Independent interpretation of notes and tests performed by another provider:   None.  Brief History, Exam, Impression, and Recommendations:    Chronic right hip pain Pleasant 66 year old female, long history of right lateral hip pain, tenderness over the greater trochanter with profound hip abductor and flexor weakness. She also has some back pain. I do suspect gluteus medius tendinosis/trochanteric bursitis. She also likely has contribution from her lumbar spine, adding 5 days of steroids, formal physical therapy, x-rays of the hip and lumbar spine. Return to see me in 6 weeks, her hip strength should be significantly better by then. Saint Jude ICD model number [id number], not MRI safe per patient.  Update: On further investigation it looks like the above ICD is MRI conditional and is safe for MRI, so if we do need to order an MRI we would just need the imaging department to contact the rep to ensure all is well  Chronic process with exacerbation and pharmacologic intervention  ____________________________________________ Ihor Austin. Benjamin Stain, M.D., ABFM., CAQSM., AME. Primary Care and Sports Medicine [facility]  Adjunct Professor of Family Medicine  [employer]  Restaurant manager, fast food

## 2022-08-05 NOTE — Assessment & Plan Note (Addendum)
Pleasant 66 year old female, long history of right lateral hip pain, tenderness over the greater trochanter with profound hip abductor and flexor weakness. She also has some back pain. I do suspect gluteus medius tendinosis/trochanteric bursitis. She also likely has contribution from her lumbar spine, adding 5 days of steroids, formal physical therapy, x-rays of the hip and lumbar spine. Return to see me in 6 weeks, her hip strength should be significantly better by then. Saint Jude ICD model number [id number], not MRI safe per patient.  Update: On further investigation it looks like the above ICD is MRI conditional and is safe for MRI, so if we do need to order an MRI we would just need the imaging department to contact the rep to ensure all is well

## 2022-08-06 DIAGNOSIS — Z95810 Presence of automatic (implantable) cardiac defibrillator: Secondary | ICD-10-CM | POA: Diagnosis not present

## 2022-08-06 DIAGNOSIS — K760 Fatty (change of) liver, not elsewhere classified: Secondary | ICD-10-CM | POA: Diagnosis not present

## 2022-08-06 DIAGNOSIS — M797 Fibromyalgia: Secondary | ICD-10-CM | POA: Diagnosis not present

## 2022-08-06 DIAGNOSIS — M5135 Other intervertebral disc degeneration, thoracolumbar region: Secondary | ICD-10-CM | POA: Diagnosis not present

## 2022-08-06 DIAGNOSIS — D649 Anemia, unspecified: Secondary | ICD-10-CM | POA: Diagnosis not present

## 2022-08-06 DIAGNOSIS — R7303 Prediabetes: Secondary | ICD-10-CM | POA: Diagnosis not present

## 2022-08-06 DIAGNOSIS — E782 Mixed hyperlipidemia: Secondary | ICD-10-CM | POA: Diagnosis not present

## 2022-08-06 DIAGNOSIS — I110 Hypertensive heart disease with heart failure: Secondary | ICD-10-CM | POA: Diagnosis not present

## 2022-08-06 DIAGNOSIS — I493 Ventricular premature depolarization: Secondary | ICD-10-CM | POA: Diagnosis not present

## 2022-08-06 DIAGNOSIS — I351 Nonrheumatic aortic (valve) insufficiency: Secondary | ICD-10-CM | POA: Diagnosis not present

## 2022-08-06 DIAGNOSIS — Z87442 Personal history of urinary calculi: Secondary | ICD-10-CM | POA: Diagnosis not present

## 2022-08-06 DIAGNOSIS — Z7982 Long term (current) use of aspirin: Secondary | ICD-10-CM | POA: Diagnosis not present

## 2022-08-06 DIAGNOSIS — Z79899 Other long term (current) drug therapy: Secondary | ICD-10-CM | POA: Diagnosis not present

## 2022-08-06 DIAGNOSIS — Z87891 Personal history of nicotine dependence: Secondary | ICD-10-CM | POA: Diagnosis not present

## 2022-08-06 DIAGNOSIS — I5022 Chronic systolic (congestive) heart failure: Secondary | ICD-10-CM | POA: Diagnosis not present

## 2022-08-06 DIAGNOSIS — Z9181 History of falling: Secondary | ICD-10-CM | POA: Diagnosis not present

## 2022-08-06 DIAGNOSIS — I428 Other cardiomyopathies: Secondary | ICD-10-CM | POA: Diagnosis not present

## 2022-08-07 NOTE — Progress Notes (Signed)
Remote ICD transmission.   

## 2022-08-08 ENCOUNTER — Encounter: Payer: Self-pay | Admitting: Obstetrics & Gynecology

## 2022-08-13 ENCOUNTER — Other Ambulatory Visit (HOSPITAL_COMMUNITY)
Admission: RE | Admit: 2022-08-13 | Discharge: 2022-08-13 | Disposition: A | Discharge status: Home / Self Care | Payer: Medicare Other | Source: Ambulatory Visit | Attending: Obstetrics & Gynecology | Admitting: Obstetrics & Gynecology

## 2022-08-13 ENCOUNTER — Ambulatory Visit: Payer: Medicare Other | Attending: Sports Medicine | Admitting: Physical Therapy

## 2022-08-13 ENCOUNTER — Ambulatory Visit (INDEPENDENT_AMBULATORY_CARE_PROVIDER_SITE_OTHER): Payer: Medicare Other

## 2022-08-13 ENCOUNTER — Encounter: Payer: Self-pay | Admitting: Physical Therapy

## 2022-08-13 DIAGNOSIS — M6281 Muscle weakness (generalized): Secondary | ICD-10-CM | POA: Diagnosis present

## 2022-08-13 DIAGNOSIS — R29898 Other symptoms and signs involving the musculoskeletal system: Secondary | ICD-10-CM | POA: Diagnosis present

## 2022-08-13 DIAGNOSIS — M25551 Pain in right hip: Secondary | ICD-10-CM | POA: Insufficient documentation

## 2022-08-13 DIAGNOSIS — G8929 Other chronic pain: Secondary | ICD-10-CM | POA: Diagnosis not present

## 2022-08-13 DIAGNOSIS — N898 Other specified noninflammatory disorders of vagina: Secondary | ICD-10-CM | POA: Insufficient documentation

## 2022-08-13 NOTE — Therapy (Signed)
OUTPATIENT PHYSICAL THERAPY LOWER EXTREMITY EVALUATION   Patient Name: Alice Stewart MRN: [id number] DOB:1956-05-26, 66 y.o., female Today's Date: 08/13/2022   PT End of Session - 08/13/22 1629     Visit Number 1    Number of Visits 12    Date for PT Re-Evaluation 09/24/22    Authorization Type UHC Medicare    Authorization - Visit Number 1    Progress Note Due on Visit 10    PT Start Time 1445    PT Stop Time 1530    PT Time Calculation (min) 45 min    Activity Tolerance Patient tolerated treatment well    Behavior During Therapy WFL for tasks assessed/performed             Past Medical History:  Diagnosis Date   Anemia    Back pain    Benign positional vertigo    Cardiomyopathy (HCC)    Carpal tunnel syndrome    Goiter    nontoxid mutinodular   Hypertension    Hyperthyroidism    Insulin resistance syndrome    Menopausal disorder    Menorrhagia    Migraine    Muscle spasm    trapezius   Neck pain, acute    Obesity    Seborrheic dermatitis    Thyroid disorder    Past Surgical History:  Procedure Laterality Date   DILATION AND CURETTAGE OF UTERUS  04/09   ENDOMETRIAL ABLATION  04/09   GREAT TOE ARTHRODESIS, METATARSALPHALANGEAL JOINT  6/07   Rt foot   HEEL SPUR SURGERY  11/87   Lt foot   ICD GENERATOR CHANGEOUT N/A 01/18/2022   Procedure: ICD GENERATOR CHANGEOUT;  Surgeon: Regan Lemming, MD;  Location: [facility] INVASIVE CV LAB;  Service: Cardiovascular;  Laterality: N/A;   TUBAL LIGATION  11/90   Patient Active Problem List   Diagnosis Date Noted   Chronic right hip pain 08/05/2022   Recurrent BV (bacterial vaginosis) 07/02/2022   Patellofemoral pain syndrome of both knees 05/01/2022   Yeast detected 04/11/2022   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 04/08/2022   Chronic systolic heart failure (HCC) 01/08/2022   Abdominal pain, acute, generalized 08/08/2021   Right ureteral stone 08/08/2021   Left foot pain 07/29/2021   Cervical  radiculopathy 07/29/2021   Vaginal discharge 06/29/2021   Encounter for removal of ureteral stent 06/13/2021   Depression 06/30/2018   Presence of cardiac pacemaker 03/22/2015   Fatty liver 03/22/2015   Fibromyalgia 09/30/2012   Hyperthyroidism 02/18/2011   Anemia 02/25/2008   MENORRHAGIA 02/25/2008   MENOPAUSAL DISORDER 08/10/2007   OBESITY NOS 03/05/2007   Back pain 03/05/2007   MUSCLE SPASM, TRAPEZIUS 01/26/2007   MIGRAINE, COMMON W/O INTRACTABLE MIGRAINE 10/29/2006   GOITER, NONTOXIC MULTINODULAR 10/06/2006   HYPERCHOLESTEROLEMIA 09/30/2006   HYPERTENSION, BENIGN SYSTEMIC 09/30/2006    REFERRING PROVIDER: Benjamin Stain  REFERRING DIAG: Chronic Rt hip pain  THERAPY DIAG:  Muscle weakness (generalized)  Pain in right hip  Other symptoms and signs involving the musculoskeletal system  Rationale for Evaluation and Treatment Rehabilitation  ONSET DATE: 07/2022  SUBJECTIVE:   SUBJECTIVE STATEMENT: Pt states 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 She was diagnosed with bursitis. Through the years the pain as continued and she realized she can not manage the pain on her own. She tried massage, muscle rubs, hot and cold as well as prescribed pain meds and OTC meds. She states pain increases when she  is on her feet a lot and with walking >3-5 minutes. The pain is also going into her back. Pain decreases with use of icy hot.  PERTINENT HISTORY: Pt has neck and back pain, knee pain. Lt heel spurs  PAIN:  Are you having pain? Yes: NPRS scale: 5/10 Pain location: Rt hip Pain description: "you know it's there" Aggravating factors: standing, walking Relieving factors: icy hot  PRECAUTIONS: pacemaker - no estim  WEIGHT BEARING RESTRICTIONS No  FALLS:  Has patient fallen in last 6 months? No  LIVING ENVIRONMENT: Lives with: lives alone Lives in: House/apartment Stairs: Yes: External: 5 steps; none Has following equipment at home:  None  OCCUPATION: on SS disability  PLOF: Independent  PATIENT GOALS reduce pain   OBJECTIVE:   DIAGNOSTIC FINDINGS: x ray shows no hip abnormality  PATIENT SURVEYS:  FOTO 63  COGNITION:  Overall cognitive status: Within functional limits for tasks assessed      MUSCLE LENGTH: Quad and hip flexor length decreased bilat  PALPATION: TTP Rt greater trochanter, Rt iliac crest, glutes  LOWER EXTREMITY ROM:  Active ROM Right eval Left eval  Hip flexion    Hip extension    Hip abduction    Hip adduction    Hip internal rotation Limited 25% Limited 25%  Hip external rotation Limited 25% WFL  Knee flexion    Knee extension    Ankle dorsiflexion    Ankle plantarflexion    Ankle inversion    Ankle eversion     (Blank rows = not tested)  LOWER EXTREMITY MMT:  MMT Right eval Left eval  Hip flexion 3/5 3/5  Hip extension 3/5 3/5  Hip abduction 3+/5 3+/5  Hip adduction    Hip internal rotation    Hip external rotation    Knee flexion    Knee extension    Ankle dorsiflexion    Ankle plantarflexion    Ankle inversion    Ankle eversion     (Blank rows = not tested)   FUNCTIONAL TESTS:  5 times sit to stand: 14.28 seconds  GAIT: Distance walked: 110' Assistive device utilized: None Level of assistance: Complete Independence Comments: decreased cadence    TODAY'S TREATMENT: 08/13/22 SLR x 5 bilat Bridge x 10 Sidelying hip abd x 5 Clam x 5   PATIENT EDUCATION:  Education details: PT POC and goals, HEP, ionto Person educated: Patient Education method: Programmer, multimedia, Facilities manager, and Handouts Education comprehension: verbalized understanding and returned demonstration   HOME EXERCISE PROGRAM: Access Code: [email or web address] URL: [email or web address] Date: 08/13/2022 Prepared by: Reggy Eye  Exercises - Active Straight Leg Raise with Quad Set  - 1 x daily - 7 x weekly - 3 sets - 10 reps - Supine Bridge  - 1 x daily - 7 x weekly - 3  sets - 10 reps - Sidelying Hip Abduction  - 1 x daily - 7 x weekly - 3 sets - 10 reps - Clamshell  - 1 x daily - 7 x weekly - 3 sets - 10 reps  Patient Education - [place] Patient Instructions  ASSESSMENT:  CLINICAL IMPRESSION: Patient is a 66 y.o. female who was seen today for physical therapy evaluation and treatment for chronic Rt hip pain. Pt presents with decreased strength, activity tolerance, mobility, impaired flexibility and increased pain. Pt will benefit from skilled PT to address deficits and improve functional mobility.    OBJECTIVE IMPAIRMENTS decreased activity tolerance, decreased mobility, difficulty walking, decreased strength, impaired flexibility, and pain.   ACTIVITY LIMITATIONS  standing and locomotion level  PARTICIPATION LIMITATIONS: cleaning, community activity, and occupation  PERSONAL FACTORS Fitness, Past/current experiences, and Time since onset of injury/illness/exacerbation are also affecting patient's functional outcome.   REHAB POTENTIAL: Good  CLINICAL DECISION MAKING: Evolving/moderate complexity  EVALUATION COMPLEXITY: Moderate   GOALS: Goals reviewed with patient? Yes    LONG TERM GOALS: Target date: 09/24/2022   Pt will be independent with HEP Baseline:  Goal status: INITIAL  2.  Pt will improve FOTO to >= 67 to demo improved functional mobility Baseline:  Goal status: INITIAL  3.  Pt will improve bilat LE strength to 4+/5 to tolerate walking and standing Baseline:  Goal status: INITIAL  4.  Pt will tolerate walking x 20 minutes with pain <= 1/10 Baseline:  Goal status: INITIAL   PLAN: PT FREQUENCY: 2x/week  PT DURATION: 6 weeks  PLANNED INTERVENTIONS: Therapeutic exercises, Therapeutic activity, Neuromuscular re-education, Balance training, Gait training, Patient/Family education, Self Care, Joint mobilization, Aquatic Therapy, Dry Needling, Cryotherapy, Moist heat, Taping, Ionotophoresis 4mg /ml Dexamethasone, Manual therapy,  and Re-evaluation  PLAN FOR NEXT SESSION: assess and progress HEP, Aggressive hip strengthening!   Beau [place], PT 08/13/2022, 4:30 PM

## 2022-08-13 NOTE — Progress Notes (Signed)
Pt here for self swab TOC from positive BV in July per Dr.Leggett. Pt states she does have vaginal itching. Pt is aware we will contact with results.

## 2022-08-14 LAB — CERVICOVAGINAL ANCILLARY ONLY
Bacterial Vaginitis (gardnerella): POSITIVE — AB
Candida Glabrata: NEGATIVE
Candida Vaginitis: NEGATIVE
Comment: NEGATIVE
Comment: NEGATIVE
Comment: NEGATIVE

## 2022-08-15 ENCOUNTER — Other Ambulatory Visit: Payer: Self-pay | Admitting: Obstetrics & Gynecology

## 2022-08-15 ENCOUNTER — Ambulatory Visit: Payer: Medicare Other | Admitting: Rehabilitative and Restorative Service Providers"

## 2022-08-15 ENCOUNTER — Encounter: Payer: Self-pay | Admitting: Rehabilitative and Restorative Service Providers"

## 2022-08-15 DIAGNOSIS — R29898 Other symptoms and signs involving the musculoskeletal system: Secondary | ICD-10-CM

## 2022-08-15 DIAGNOSIS — M6281 Muscle weakness (generalized): Secondary | ICD-10-CM

## 2022-08-15 DIAGNOSIS — M25551 Pain in right hip: Secondary | ICD-10-CM | POA: Diagnosis not present

## 2022-08-15 DIAGNOSIS — G8929 Other chronic pain: Secondary | ICD-10-CM | POA: Diagnosis not present

## 2022-08-15 MED ORDER — METRONIDAZOLE 500 MG PO TABS: 500.0000 mg | ORAL_TABLET | Freq: Two times a day (BID) | ORAL | 0 refills | Status: DC

## 2022-08-15 NOTE — Therapy (Signed)
OUTPATIENT PHYSICAL THERAPY LOWER EXTREMITY EVALUATION   Patient Name: Alice Stewart MRN: [id number] DOB:1956/07/12, 66 y.o., female Today's Date: 08/15/2022   PT End of Session - 08/15/22 1438     Visit Number 2    Number of Visits 12    Date for PT Re-Evaluation 09/24/22    Authorization Type UHC Medicare    Authorization - Visit Number 2    Progress Note Due on Visit 10    PT Start Time 1438    PT Stop Time 1526    PT Time Calculation (min) 48 min    Activity Tolerance Patient tolerated treatment well             Past Medical History:  Diagnosis Date   Anemia    Back pain    Benign positional vertigo    Cardiomyopathy (HCC)    Carpal tunnel syndrome    Goiter    nontoxid mutinodular   Hypertension    Hyperthyroidism    Insulin resistance syndrome    Menopausal disorder    Menorrhagia    Migraine    Muscle spasm    trapezius   Neck pain, acute    Obesity    Seborrheic dermatitis    Thyroid disorder    Past Surgical History:  Procedure Laterality Date   DILATION AND CURETTAGE OF UTERUS  04/09   ENDOMETRIAL ABLATION  04/09   GREAT TOE ARTHRODESIS, METATARSALPHALANGEAL JOINT  6/07   Rt foot   HEEL SPUR SURGERY  11/87   Lt foot   ICD GENERATOR CHANGEOUT N/A 01/18/2022   Procedure: ICD GENERATOR CHANGEOUT;  Surgeon: Regan Lemming, MD;  Location: [facility] INVASIVE CV LAB;  Service: Cardiovascular;  Laterality: N/A;   TUBAL LIGATION  11/90   Patient Active Problem List   Diagnosis Date Noted   Chronic right hip pain 08/05/2022   Recurrent BV (bacterial vaginosis) 07/02/2022   Patellofemoral pain syndrome of both knees 05/01/2022   Yeast detected 04/11/2022   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 04/08/2022   Chronic systolic heart failure (HCC) 01/08/2022   Abdominal pain, acute, generalized 08/08/2021   Right ureteral stone 08/08/2021   Left foot pain 07/29/2021   Cervical radiculopathy 07/29/2021   Vaginal discharge 06/29/2021   Encounter for  removal of ureteral stent 06/13/2021   Depression 06/30/2018   Presence of cardiac pacemaker 03/22/2015   Fatty liver 03/22/2015   Fibromyalgia 09/30/2012   Hyperthyroidism 02/18/2011   Anemia 02/25/2008   MENORRHAGIA 02/25/2008   MENOPAUSAL DISORDER 08/10/2007   OBESITY NOS 03/05/2007   Back pain 03/05/2007   MUSCLE SPASM, TRAPEZIUS 01/26/2007   MIGRAINE, COMMON W/O INTRACTABLE MIGRAINE 10/29/2006   GOITER, NONTOXIC MULTINODULAR 10/06/2006   HYPERCHOLESTEROLEMIA 09/30/2006   HYPERTENSION, BENIGN SYSTEMIC 09/30/2006    REFERRING PROVIDER: Benjamin Stain  REFERRING DIAG: Chronic Rt hip pain  THERAPY DIAG:  Muscle weakness (generalized)  Pain in right hip  Other symptoms and signs involving the musculoskeletal system  Rationale for Evaluation and Treatment Rehabilitation  ONSET DATE: 07/2022  SUBJECTIVE:   SUBJECTIVE STATEMENT:  08/15/22: Patient reports that she did well with her exercises and did not have any problems with them at home. Did have increased pain in the back and thighs when she was taking out her trash yesterday. The pain she has now is different from how it has been in the past. She has had pain in the Rt hip and LB since injury in 2014 when she was pulling her 130# dad across  the floor after he had fallen She has treated symptoms with OTC meds, icy hot, massage. No change with ionto patch.    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 She was diagnosed with bursitis. Through the years the pain as continued and she realized she can not manage the pain on her own. She tried massage, muscle rubs, hot and cold as well as prescribed pain meds and OTC meds. She states pain increases when she is on her feet a lot and with walking >3-5 minutes. The pain is also going into her back. Pain decreases with use of icy hot.  PERTINENT HISTORY: Pt has neck and back pain, knee pain. Lt heel spurs  PAIN:  08/15/22 Are you  having pain? Yes: NPRS scale: 5/10 Pain location: Rt hip Pain description: "you know it's there" Aggravating factors: standing, walking Relieving factors: icy hot  PRECAUTIONS: pacemaker - no estim  WEIGHT BEARING RESTRICTIONS No  FALLS:  Has patient fallen in last 6 months? No  LIVING ENVIRONMENT: Lives with: lives alone Lives in: House/apartment Stairs: Yes: External: 5 steps; none Has following equipment at home: None  OCCUPATION: on SS disability  PLOF: Independent  PATIENT GOALS reduce pain   OBJECTIVE:   DIAGNOSTIC FINDINGS: x ray shows no hip abnormality  PATIENT SURVEYS:  FOTO 63  COGNITION:  Overall cognitive status: Within functional limits for tasks assessed      MUSCLE LENGTH: Quad and hip flexor length decreased bilat  PALPATION: TTP Rt greater trochanter, Rt iliac crest, glutes  LOWER EXTREMITY ROM:  Active ROM Right eval Left eval  Hip flexion    Hip extension    Hip abduction    Hip adduction    Hip internal rotation Limited 25% Limited 25%  Hip external rotation Limited 25% WFL  Knee flexion    Knee extension    Ankle dorsiflexion    Ankle plantarflexion    Ankle inversion    Ankle eversion     (Blank rows = not tested)  LOWER EXTREMITY MMT:  MMT Right eval Left eval  Hip flexion 3/5 3/5  Hip extension 3/5 3/5  Hip abduction 3+/5 3+/5  Hip adduction    Hip internal rotation    Hip external rotation    Knee flexion    Knee extension    Ankle dorsiflexion    Ankle plantarflexion    Ankle inversion    Ankle eversion     (Blank rows = not tested)   FUNCTIONAL TESTS:  5 times sit to stand: 14.28 seconds  GAIT: Distance walked: 110' Assistive device utilized: None Level of assistance: Complete Independence Comments: decreased cadence    TODAY'S TREATMENT: 08/15/22 SLR x 5 bilat Bridge x 10 Sidelying hip abd x 10 - with PT assist for technique Clam x 10 Piriformis stretch 30sec x 3 travell w/strap PT assist   Prone glut set 10  Prone hip ext toe resting on table 3 sec hold x 10 bilat Hip flexor stretch sitting 30 sec x 3   Myofacial ball release standing piriformis; trial of hip flexors but was not tender with ball release    PATIENT EDUCATION:  Education details: PT POC and goals, HEP, ionto Person educated: Patient Education method: Explanation, Demonstration, and Handouts Education comprehension: verbalized understanding and returned demonstration   HOME EXERCISE PROGRAM: Access Code: [email or web address] URL: [email or web address] Date: 08/15/2022 Prepared by: Corlis Leak  Exercises - Active Straight Leg Raise with  Quad Set  - 1 x daily - 7 x weekly - 3 sets - 10 reps - Supine Bridge  - 1 x daily - 7 x weekly - 3 sets - 10 reps - Sidelying Hip Abduction  - 1 x daily - 7 x weekly - 3 sets - 10 reps - Clamshell  - 1 x daily - 7 x weekly - 3 sets - 10 reps - Supine Piriformis Stretch with Leg Straight  - 2 x daily - 7 x weekly - 1 sets - 3 reps - 30 sec  hold - Prone Gluteal Sets  - 2 x daily - 7 x weekly - 1 sets - 10 reps - 10 sec  hold - Prone Hip Extension  - 2 x daily - 7 x weekly - 1 sets - 10 reps - 3 sec  hold - Seated Hip Flexor Stretch  - 2 x daily - 7 x weekly - 1 sets - 3 reps - 30 sec  hold - Standing Piriformis Release with Ball at [place]  - 2 x daily - 7 x weekly - 30-60 sec  hold  ASSESSMENT:  CLINICAL IMPRESSION:   08/15/22: Curt Bears reports continued pain with no change in symptoms. Reviewed exercises and added exercises and myofacial ball release work.   Eval: Patient is a 66 y.o. female who was seen today for physical therapy evaluation and treatment for chronic Rt hip pain. Pt presents with decreased strength, activity tolerance, mobility, impaired flexibility and increased pain. Pt will benefit from skilled PT to address deficits and improve functional mobility.    OBJECTIVE IMPAIRMENTS decreased activity tolerance, decreased mobility, difficulty walking,  decreased strength, impaired flexibility, and pain.   ACTIVITY LIMITATIONS standing and locomotion level  PARTICIPATION LIMITATIONS: cleaning, community activity, and occupation  [place], Past/current experiences, and Time since onset of injury/illness/exacerbation are also affecting patient's functional outcome.   REHAB POTENTIAL: Good  CLINICAL DECISION MAKING: Evolving/moderate complexity  EVALUATION COMPLEXITY: Moderate   GOALS: Goals reviewed with patient? Yes    LONG TERM GOALS: Target date: 09/26/2022   Pt will be independent with HEP Baseline:  Goal status: INITIAL  2.  Pt will improve FOTO to >= 67 to demo improved functional mobility Baseline:  Goal status: INITIAL  3.  Pt will improve bilat LE strength to 4+/5 to tolerate walking and standing Baseline:  Goal status: INITIAL  4.  Pt will tolerate walking x 20 minutes with pain <= 1/10 Baseline:  Goal status: INITIAL   PLAN: PT FREQUENCY: 2x/week  PT DURATION: 6 weeks  PLANNED INTERVENTIONS: Therapeutic exercises, Therapeutic activity, Neuromuscular re-education, Balance training, Gait training, Patient/Family education, Self Care, Joint mobilization, Aquatic Therapy, Dry Needling, Cryotherapy, Moist heat, Taping, Ionotophoresis 4mg /ml Dexamethasone, Manual therapy, and Re-evaluation  PLAN FOR NEXT SESSION: assess and progress HEP, Aggressive hip strengthening!   [place], PT 08/15/2022, 3:28 PM

## 2022-08-15 NOTE — Progress Notes (Signed)
Flagyl sent in to pharmacy.  Needs TOC next Friday to determine if she has a resistant infection or a reoccurrence.

## 2022-08-19 ENCOUNTER — Encounter: Payer: Self-pay | Admitting: Obstetrics & Gynecology

## 2022-08-19 ENCOUNTER — Ambulatory Visit (INDEPENDENT_AMBULATORY_CARE_PROVIDER_SITE_OTHER): Payer: Medicare Other | Admitting: Obstetrics & Gynecology

## 2022-08-19 VITALS — BP 156/80 | HR 60 | Resp 16 | Ht 64.0 in | Wt 249.0 lb

## 2022-08-19 DIAGNOSIS — I10 Essential (primary) hypertension: Secondary | ICD-10-CM | POA: Diagnosis not present

## 2022-08-19 DIAGNOSIS — R32 Unspecified urinary incontinence: Secondary | ICD-10-CM | POA: Diagnosis not present

## 2022-08-19 DIAGNOSIS — N3945 Continuous leakage: Secondary | ICD-10-CM

## 2022-08-19 DIAGNOSIS — N3941 Urge incontinence: Secondary | ICD-10-CM | POA: Diagnosis not present

## 2022-08-19 MED ORDER — METRONIDAZOLE 0.75 % VA GEL: VAGINAL | 5 refills | Status: DC

## 2022-08-19 NOTE — Progress Notes (Signed)
   Subjective:    Patient ID: Alice Stewart, female    DOB: July 13, 1956, 65 y.o.   MRN: [id number]  HPI  Alice Stewart is a 66 year old G4 para 3-0-1-3 female who presents with vaginal burning, urge incontinence symptoms, and urine dribbling.  The patient has recurrent BV.  She is on Flagyl currently and will finish her therapy Friday.  She will come in for a self swabs to ensure that the BV has been eradicated.  She will then start MetroGel Saturday 1 applicator per vagina twice a week.  If it happens that she did not clear the infection I will call her and we will switch to clindamycin cream.  Patient and her boyfriend broke up so she was no longer interested in conceiving.  He also was recently hospitalized with severe hypertension.  Her blood pressure is still elevated today.  Review of Systems  Respiratory: Negative.    Cardiovascular: Negative.   Gastrointestinal: Negative.   Genitourinary:  Positive for vaginal discharge and vaginal pain.       Urge incontinence and dribbling       Objective:   Physical Exam Vitals reviewed.  Constitutional:      General: She is not in acute distress.    Appearance: She is well-developed.  HENT:     Head: Normocephalic and atraumatic.  Eyes:     Conjunctiva/sclera: Conjunctivae normal.  Cardiovascular:     Rate and Rhythm: Normal rate.  Pulmonary:     Effort: Pulmonary effort is normal.  Genitourinary:    Comments: Tanner V Vulva--labia majora is red, most likely from incontinence and pads On the exam of the introitus urine is leaking onto the table.  Patient does not feel this. No blood in the vaginal vault. Skin:    General: Skin is warm and dry.  Neurological:     Mental Status: She is alert and oriented to person, place, and time.  Psychiatric:        Mood and Affect: Mood normal.    Vitals:   08/19/22 1316  BP: (!) 156/80  Pulse: 60  Resp: 16  Weight: 249 lb (112.9 kg)  Height: 5\' 4"  (1.626 m)      Assessment & Plan:   66 year old female with recurrent bacterial vaginosis and urinary dribbling and urge incontinence. Patient will come in for test of cure Friday.  If it is eradicated we will start MetroGel twice a week.  If it is not we will start Cleocin cream. 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 We discussed today her health care issues and she is resigned that she will not attempt conception at this time. I provided 30 minutes of verbal and non-verbal time during this encounter date, time was needed to gather information, review chart, records, communicate/coordinate with staff, as well as complete documentation.

## 2022-08-19 NOTE — Progress Notes (Signed)
Pt has vaginal burning when she gets out of the shower- denies painful urination  Repeat BP 160/70

## 2022-08-20 ENCOUNTER — Ambulatory Visit: Payer: Medicare Other | Admitting: Physical Therapy

## 2022-08-20 ENCOUNTER — Encounter: Payer: Self-pay | Admitting: Physical Therapy

## 2022-08-20 ENCOUNTER — Telehealth: Payer: Self-pay

## 2022-08-20 DIAGNOSIS — R29898 Other symptoms and signs involving the musculoskeletal system: Secondary | ICD-10-CM | POA: Diagnosis not present

## 2022-08-20 DIAGNOSIS — M6281 Muscle weakness (generalized): Secondary | ICD-10-CM | POA: Diagnosis not present

## 2022-08-20 DIAGNOSIS — M25551 Pain in right hip: Secondary | ICD-10-CM | POA: Diagnosis not present

## 2022-08-20 DIAGNOSIS — G8929 Other chronic pain: Secondary | ICD-10-CM | POA: Diagnosis not present

## 2022-08-20 NOTE — Therapy (Signed)
OUTPATIENT PHYSICAL THERAPY   Patient Name: Alice Stewart MRN: [id number] DOB:1956-06-07, 66 y.o., female Today's Date: 08/20/2022   PT End of Session - 08/20/22 1705     Visit Number 3    Number of Visits 12    Date for PT Re-Evaluation 09/24/22    Authorization Type [employer] Medicare    Authorization - Visit Number 3    Progress Note Due on Visit 10    PT Start Time 1621    PT Stop Time 1706    PT Time Calculation (min) 45 min    Activity Tolerance Patient tolerated treatment well    Behavior During Therapy WFL for tasks assessed/performed              Past Medical History:  Diagnosis Date   Anemia    Back pain    Benign positional vertigo    Cardiomyopathy (HCC)    Carpal tunnel syndrome    Goiter    nontoxid mutinodular   Hypertension    Hyperthyroidism    Insulin resistance syndrome    Menopausal disorder    Menorrhagia    Migraine    Muscle spasm    trapezius   Neck pain, acute    Obesity    Seborrheic dermatitis    Thyroid disorder    Past Surgical History:  Procedure Laterality Date   DILATION AND CURETTAGE OF UTERUS  04/09   ENDOMETRIAL ABLATION  04/09   GREAT TOE ARTHRODESIS, METATARSALPHALANGEAL JOINT  6/07   Rt foot   HEEL SPUR SURGERY  11/87   Lt foot   ICD GENERATOR CHANGEOUT N/A 01/18/2022   Procedure: ICD GENERATOR CHANGEOUT;  Surgeon: Regan Lemming, MD;  Location: [facility] INVASIVE CV LAB;  Service: Cardiovascular;  Laterality: N/A;   TUBAL LIGATION  11/90   Patient Active Problem List   Diagnosis Date Noted   Chronic right hip pain 08/05/2022   Recurrent BV (bacterial vaginosis) 07/02/2022   Patellofemoral pain syndrome of both knees 05/01/2022   Yeast detected 04/11/2022   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 04/08/2022   Chronic systolic heart failure (HCC) 01/08/2022   Abdominal pain, acute, generalized 08/08/2021   Right ureteral stone 08/08/2021   Left foot pain 07/29/2021   Cervical radiculopathy 07/29/2021   Vaginal  discharge 06/29/2021   Encounter for removal of ureteral stent 06/13/2021   Depression 06/30/2018   Presence of cardiac pacemaker 03/22/2015   Fatty liver 03/22/2015   Fibromyalgia 09/30/2012   Hyperthyroidism 02/18/2011   Anemia 02/25/2008   MENORRHAGIA 02/25/2008   MENOPAUSAL DISORDER 08/10/2007   OBESITY NOS 03/05/2007   Back pain 03/05/2007   MUSCLE SPASM, TRAPEZIUS 01/26/2007   MIGRAINE, COMMON W/O INTRACTABLE MIGRAINE 10/29/2006   GOITER, NONTOXIC MULTINODULAR 10/06/2006   HYPERCHOLESTEROLEMIA 09/30/2006   HYPERTENSION, BENIGN SYSTEMIC 09/30/2006    REFERRING PROVIDER: Benjamin Stain  REFERRING DIAG: Chronic Rt hip pain  THERAPY DIAG:  Muscle weakness (generalized)  Pain in right hip  Other symptoms and signs involving the musculoskeletal system  Rationale for Evaluation and Treatment Rehabilitation  ONSET DATE: 07/2022  SUBJECTIVE:   SUBJECTIVE STATEMENT: Pt states she has had increased pain today. She did not have any change in activity but continues with increased pain. She states she really wants an injection in her hip   PERTINENT HISTORY: Pt has neck and back pain, knee pain. Lt heel spurs  PAIN:  08/15/22 Are you having pain? Yes: NPRS scale: 7/10 Pain location: Rt hip Pain description: "you know it's  there" Aggravating factors: standing, walking Relieving factors: icy hot  PRECAUTIONS: pacemaker - no estim  PATIENT GOALS reduce pain   OBJECTIVE:   PALPATION: TTP Rt greater trochanter, Rt iliac crest, glutes  LOWER EXTREMITY ROM:  Active ROM Right eval Left eval  Hip flexion    Hip extension    Hip abduction    Hip adduction    Hip internal rotation Limited 25% Limited 25%  Hip external rotation Limited 25% WFL  Knee flexion    Knee extension    Ankle dorsiflexion    Ankle plantarflexion    Ankle inversion    Ankle eversion     (Blank rows = not tested)  LOWER EXTREMITY MMT:  MMT Right eval Left eval  Hip flexion 3/5 3/5   Hip extension 3/5 3/5  Hip abduction 3+/5 3+/5  Hip adduction    Hip internal rotation    Hip external rotation    Knee flexion    Knee extension    Ankle dorsiflexion    Ankle plantarflexion    Ankle inversion    Ankle eversion     (Blank rows = not tested)   FUNCTIONAL TESTS:  5 times sit to stand: 14.28 seconds  (eval)   TODAY'S TREATMENT: 08/20/22 Nustep L5x 5 min for warm up  Myofascial ball release for piriformis and glutes  Sidestep red TB   SLR with red TB x 10 bilat - pt c/o increased pain, then x 10 without TB Bridge 2 x 10 Sidelying hip abd 2 x 10 bilat Clam red TB 2 x 10 bilat Prone hip ext 2 x 7 on Rt, 2 x 10 Lt Quadruped hip ext x 5 bilat with cuing for technique Piriformis stretch 2 x 30 sec bilat Hip flexor stretch 2 x 30 sec bilat  08/15/22 SLR x 5 bilat Bridge x 10 Sidelying hip abd x 10 - with PT assist for technique Clam x 10 Piriformis stretch 30sec x 3 travell w/strap PT assist  Prone glut set 10  Prone hip ext toe resting on table 3 sec hold x 10 bilat Hip flexor stretch sitting 30 sec x 3   Myofacial ball release standing piriformis; trial of hip flexors but was not tender with ball release    PATIENT EDUCATION:  Education details: PT POC and goals, HEP, ionto Person educated: Patient Education method: Explanation, Demonstration, and Handouts Education comprehension: verbalized understanding and returned demonstration   HOME EXERCISE PROGRAM: Access Code: [email or web address] URL: [email or web address] Date: 08/15/2022 Prepared by: Corlis Leak  Exercises - Active Straight Leg Raise with Quad Set  - 1 x daily - 7 x weekly - 3 sets - 10 reps - Supine Bridge  - 1 x daily - 7 x weekly - 3 sets - 10 reps - Sidelying Hip Abduction  - 1 x daily - 7 x weekly - 3 sets - 10 reps - Clamshell  - 1 x daily - 7 x weekly - 3 sets - 10 reps - Supine Piriformis Stretch with Leg Straight  - 2 x daily - 7 x weekly - 1 sets - 3 reps - 30 sec   hold - Prone Gluteal Sets  - 2 x daily - 7 x weekly - 1 sets - 10 reps - 10 sec  hold - Prone Hip Extension  - 2 x daily - 7 x weekly - 1 sets - 10 reps - 3 sec  hold - Seated Hip Flexor Stretch  - 2 x daily - 7 x  weekly - 1 sets - 3 reps - 30 sec  hold - Standing Piriformis Release with Ball at Wall  - 2 x daily - 7 x weekly - 30-60 sec  hold  ASSESSMENT:  CLINICAL IMPRESSION:  Pt with decreased exercise tolerance. PT educated pt on muscle soreness vs "pain" and importance of strengthening LEs to reduce pain and improve mobility    GOALS: Goals reviewed with patient? Yes    LONG TERM GOALS:  Pt will be independent with HEP Baseline:  Goal status: INITIAL  2.  Pt will improve FOTO to >= 67 to demo improved functional mobility Baseline:  Goal status: INITIAL  3.  Pt will improve bilat LE strength to 4+/5 to tolerate walking and standing Baseline:  Goal status: INITIAL  4.  Pt will tolerate walking x 20 minutes with pain <= 1/10 Baseline:  Goal status: INITIAL   PLAN: PT FREQUENCY: 2x/week  PT DURATION: 6 weeks  PLANNED INTERVENTIONS: Therapeutic exercises, Therapeutic activity, Neuromuscular re-education, Balance training, Gait training, Patient/Family education, Self Care, Joint mobilization, Aquatic Therapy, Dry Needling, Cryotherapy, Moist heat, Taping, Ionotophoresis 4mg /ml Dexamethasone, Manual therapy, and Re-evaluation  PLAN FOR NEXT SESSION: assess and progress HEP, Aggressive hip strengthening!   Jim Lundin, PT 08/20/2022, 5:05 PM

## 2022-08-20 NOTE — Telephone Encounter (Signed)
Patient called to report that she is in significant pain and would like to know what the next steps are. She is doing PT but it stills hurts.

## 2022-08-20 NOTE — Telephone Encounter (Signed)
She really needs to give this longer but if she cannot continue therapy due to pain we can do an injection and then she needs to finish 6 full weeks of physical therapy.

## 2022-08-21 NOTE — Telephone Encounter (Signed)
Patient aware of results and does wish to proceed with an injection. Call was transferred to the front desk for scheduling.

## 2022-08-22 ENCOUNTER — Other Ambulatory Visit: Payer: Medicare Other

## 2022-08-22 ENCOUNTER — Other Ambulatory Visit (HOSPITAL_COMMUNITY)
Admission: RE | Admit: 2022-08-22 | Discharge: 2022-08-22 | Disposition: A | Discharge status: Home / Self Care | Payer: Medicare Other | Source: Ambulatory Visit | Attending: Obstetrics & Gynecology | Admitting: Obstetrics & Gynecology

## 2022-08-22 DIAGNOSIS — B9689 Other specified bacterial agents as the cause of diseases classified elsewhere: Secondary | ICD-10-CM | POA: Diagnosis not present

## 2022-08-22 DIAGNOSIS — N760 Acute vaginitis: Secondary | ICD-10-CM | POA: Diagnosis present

## 2022-08-22 NOTE — Progress Notes (Signed)
Pt here for TOC BV only per Dr Penne Lash.

## 2022-08-23 ENCOUNTER — Encounter: Payer: Self-pay | Admitting: Physical Therapy

## 2022-08-23 ENCOUNTER — Ambulatory Visit: Payer: 59 | Attending: Sports Medicine | Admitting: Physical Therapy

## 2022-08-23 ENCOUNTER — Other Ambulatory Visit: Payer: Medicare Other | Admitting: *Deleted

## 2022-08-23 DIAGNOSIS — R29898 Other symptoms and signs involving the musculoskeletal system: Secondary | ICD-10-CM | POA: Insufficient documentation

## 2022-08-23 DIAGNOSIS — M25551 Pain in right hip: Secondary | ICD-10-CM | POA: Insufficient documentation

## 2022-08-23 DIAGNOSIS — M6281 Muscle weakness (generalized): Secondary | ICD-10-CM | POA: Insufficient documentation

## 2022-08-23 DIAGNOSIS — B9689 Other specified bacterial agents as the cause of diseases classified elsewhere: Secondary | ICD-10-CM

## 2022-08-23 NOTE — Therapy (Signed)
OUTPATIENT PHYSICAL THERAPY   Patient Name: Alice Stewart MRN: [id number] DOB:Dec 18, 1956, 66 y.o., female Today's Date: 08/23/2022   PT End of Session - 08/23/22 1103     Visit Number 4    Number of Visits 12    Date for PT Re-Evaluation 09/24/22    Authorization Type [employer] Medicare    Authorization - Visit Number 4    Progress Note Due on Visit 10    PT Start Time 1015    PT Stop Time 1100    PT Time Calculation (min) 45 min    Activity Tolerance Patient tolerated treatment well    Behavior During Therapy WFL for tasks assessed/performed               Past Medical History:  Diagnosis Date   Anemia    Back pain    Benign positional vertigo    Cardiomyopathy (HCC)    Carpal tunnel syndrome    Goiter    nontoxid mutinodular   Hypertension    Hyperthyroidism    Insulin resistance syndrome    Menopausal disorder    Menorrhagia    Migraine    Muscle spasm    trapezius   Neck pain, acute    Obesity    Seborrheic dermatitis    Thyroid disorder    Past Surgical History:  Procedure Laterality Date   DILATION AND CURETTAGE OF UTERUS  04/09   ENDOMETRIAL ABLATION  04/09   GREAT TOE ARTHRODESIS, METATARSALPHALANGEAL JOINT  6/07   Rt foot   HEEL SPUR SURGERY  11/87   Lt foot   ICD GENERATOR CHANGEOUT N/A 01/18/2022   Procedure: ICD GENERATOR CHANGEOUT;  Surgeon: Regan Lemming, MD;  Location: [facility] INVASIVE CV LAB;  Service: Cardiovascular;  Laterality: N/A;   TUBAL LIGATION  11/90   Patient Active Problem List   Diagnosis Date Noted   Chronic right hip pain 08/05/2022   Recurrent BV (bacterial vaginosis) 07/02/2022   Patellofemoral pain syndrome of both knees 05/01/2022   Yeast detected 04/11/2022   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 04/08/2022   Chronic systolic heart failure (HCC) 01/08/2022   Abdominal pain, acute, generalized 08/08/2021   Right ureteral stone 08/08/2021   Left foot pain 07/29/2021   Cervical radiculopathy 07/29/2021   Vaginal  discharge 06/29/2021   Encounter for removal of ureteral stent 06/13/2021   Depression 06/30/2018   Presence of cardiac pacemaker 03/22/2015   Fatty liver 03/22/2015   Fibromyalgia 09/30/2012   Hyperthyroidism 02/18/2011   Anemia 02/25/2008   MENORRHAGIA 02/25/2008   MENOPAUSAL DISORDER 08/10/2007   OBESITY NOS 03/05/2007   Back pain 03/05/2007   MUSCLE SPASM, TRAPEZIUS 01/26/2007   MIGRAINE, COMMON W/O INTRACTABLE MIGRAINE 10/29/2006   GOITER, NONTOXIC MULTINODULAR 10/06/2006   HYPERCHOLESTEROLEMIA 09/30/2006   HYPERTENSION, BENIGN SYSTEMIC 09/30/2006    REFERRING PROVIDER: Benjamin Stain  REFERRING DIAG: Chronic Rt hip pain  THERAPY DIAG:  Muscle weakness (generalized)  Pain in right hip  Other symptoms and signs involving the musculoskeletal system  Rationale for Evaluation and Treatment Rehabilitation  ONSET DATE: 07/2022  SUBJECTIVE:   SUBJECTIVE STATEMENT: Pt states the MD said he would do an injection but wants her to continue physical therapy. She has not scheduled injection yet   PERTINENT HISTORY: Pt has neck and back pain, knee pain. Lt heel spurs  PAIN:  08/15/22 Are you having pain? Yes: NPRS scale: 5/10 Pain location: Rt hip Pain description: "you know it's there" Aggravating factors: standing, walking Relieving factors:  icy hot  PRECAUTIONS: pacemaker - no estim  PATIENT GOALS reduce pain   OBJECTIVE:   PALPATION: TTP Rt greater trochanter, Rt iliac crest, glutes  LOWER EXTREMITY ROM:  Active ROM Right eval Left eval  Hip flexion    Hip extension    Hip abduction    Hip adduction    Hip internal rotation Limited 25% Limited 25%  Hip external rotation Limited 25% WFL  Knee flexion    Knee extension    Ankle dorsiflexion    Ankle plantarflexion    Ankle inversion    Ankle eversion     (Blank rows = not tested)  LOWER EXTREMITY MMT:  MMT Right eval Left eval Right 9/1 Left 9/1  Hip flexion 3/5 3/5 3+ 3+  Hip extension 3/5  3/5 3 3+  Hip abduction 3+/5 3+/5 4- 4-  Hip adduction      Hip internal rotation      Hip external rotation      Knee flexion      Knee extension      Ankle dorsiflexion      Ankle plantarflexion      Ankle inversion      Ankle eversion       (Blank rows = not tested)   FUNCTIONAL TESTS:  5 times sit to stand: 14.28 seconds  (eval)   TODAY'S TREATMENT: 08/23/22 Nustep L4 x 5 min warm up  Standing hip abd x 10 bilat red TB Standing hip ext x 10 bilat red TB  Bridge 2 x 10 SLR 2 x 5 bilat Sidelying hip abd 2 x 10 bilat Clam red TB 2 x 10 bilat Prone hip ext x 15 Rt, x 20 Lt Quadruped alt LE lifts 2 x 5 bilat Piriformis stretch 2 x 30 sec Hip flexor stretch 2 x 30 sec bilat Myofascial ball release glutes, piriformis, hip flexors  08/20/22 Nustep L5x 5 min for warm up  Myofascial ball release for piriformis and glutes  Sidestep red TB   SLR with red TB x 10 bilat - pt c/o increased pain, then x 10 without TB Bridge 2 x 10 Sidelying hip abd 2 x 10 bilat Clam red TB 2 x 10 bilat Prone hip ext 2 x 7 on Rt, 2 x 10 Lt Quadruped hip ext x 5 bilat with cuing for technique Piriformis stretch 2 x 30 sec bilat Hip flexor stretch 2 x 30 sec bilat   PATIENT EDUCATION:  Education details: PT POC and goals, HEP, ionto Person educated: Patient Education method: Explanation, Demonstration, and Handouts Education comprehension: verbalized understanding and returned demonstration   HOME EXERCISE PROGRAM: Access Code: [email or web address] URL: [email or web address] Date: 08/15/2022 Prepared by: Corlis Leak  Exercises - Active Straight Leg Raise with Quad Set  - 1 x daily - 7 x weekly - 3 sets - 10 reps - Supine Bridge  - 1 x daily - 7 x weekly - 3 sets - 10 reps - Sidelying Hip Abduction  - 1 x daily - 7 x weekly - 3 sets - 10 reps - Clamshell  - 1 x daily - 7 x weekly - 3 sets - 10 reps - Supine Piriformis Stretch with Leg Straight  - 2 x daily - 7 x weekly - 1 sets - 3  reps - 30 sec  hold - Prone Gluteal Sets  - 2 x daily - 7 x weekly - 1 sets - 10 reps - 10 sec  hold - Prone Hip Extension  -  2 x daily - 7 x weekly - 1 sets - 10 reps - 3 sec  hold - Seated Hip Flexor Stretch  - 2 x daily - 7 x weekly - 1 sets - 3 reps - 30 sec  hold - Standing Piriformis Release with Ball at Wall  - 2 x daily - 7 x weekly - 30-60 sec  hold  ASSESSMENT:  CLINICAL IMPRESSION:  Pt able to tolerate more exercise with resistance today. Still limited in hip extension strength but improving hip flexion and abduction    GOALS: Goals reviewed with patient? Yes    LONG TERM GOALS:  Pt will be independent with HEP Baseline:  Goal status: INITIAL  2.  Pt will improve FOTO to >= 67 to demo improved functional mobility Baseline:  Goal status: INITIAL  3.  Pt will improve bilat LE strength to 4+/5 to tolerate walking and standing Baseline:  Goal status: INITIAL  4.  Pt will tolerate walking x 20 minutes with pain <= 1/10 Baseline:  Goal status: INITIAL   PLAN: PT FREQUENCY: 2x/week  PT DURATION: 6 weeks  PLANNED INTERVENTIONS: Therapeutic exercises, Therapeutic activity, Neuromuscular re-education, Balance training, Gait training, Patient/Family education, Self Care, Joint mobilization, Aquatic Therapy, Dry Needling, Cryotherapy, Moist heat, Taping, Ionotophoresis 4mg /ml Dexamethasone, Manual therapy, and Re-evaluation  PLAN FOR NEXT SESSION: assess and progress HEP, Aggressive hip strengthening!   Genora Arp, PT 08/23/2022, 11:04 AM

## 2022-08-27 ENCOUNTER — Ambulatory Visit: Payer: 59 | Admitting: Physical Therapy

## 2022-08-27 ENCOUNTER — Other Ambulatory Visit: Payer: Self-pay | Admitting: Obstetrics & Gynecology

## 2022-08-27 LAB — CERVICOVAGINAL ANCILLARY ONLY
Bacterial Vaginitis (gardnerella): POSITIVE — AB
Candida Glabrata: NEGATIVE
Candida Vaginitis: NEGATIVE
Comment: NEGATIVE
Comment: NEGATIVE
Comment: NEGATIVE

## 2022-08-27 MED ORDER — CLINDAMYCIN PHOSPHATE 2 % VA CREA: 1.0000 | TOPICAL_CREAM | Freq: Every day | VAGINAL | 0 refills | Status: DC

## 2022-08-27 NOTE — Progress Notes (Signed)
BV did not clear with oral flagyl.  Cleocin cream prescribed.  Need TOC the day after last dose of cleocin cream.

## 2022-08-30 ENCOUNTER — Ambulatory Visit: Payer: 59 | Admitting: Physical Therapy

## 2022-08-30 ENCOUNTER — Encounter: Payer: Self-pay | Admitting: Physical Therapy

## 2022-08-30 DIAGNOSIS — M6281 Muscle weakness (generalized): Secondary | ICD-10-CM | POA: Diagnosis not present

## 2022-08-30 DIAGNOSIS — R29898 Other symptoms and signs involving the musculoskeletal system: Secondary | ICD-10-CM

## 2022-08-30 DIAGNOSIS — M25551 Pain in right hip: Secondary | ICD-10-CM

## 2022-08-30 NOTE — Therapy (Signed)
OUTPATIENT PHYSICAL THERAPY   Patient Name: Alice Stewart MRN: [id number] DOB:Jul 18, 1956, 66 y.o., female Today's Date: 08/30/2022   PT End of Session - 08/30/22 1142     Visit Number 5    Number of Visits 12    Date for PT Re-Evaluation 09/24/22    Authorization Type [employer] Medicare    Authorization - Visit Number 5    Progress Note Due on Visit 10    PT Start Time 1100    PT Stop Time 1141    PT Time Calculation (min) 41 min    Activity Tolerance Patient tolerated treatment well    Behavior During Therapy WFL for tasks assessed/performed                Past Medical History:  Diagnosis Date   Anemia    Back pain    Benign positional vertigo    Cardiomyopathy (HCC)    Carpal tunnel syndrome    Goiter    nontoxid mutinodular   Hypertension    Hyperthyroidism    Insulin resistance syndrome    Menopausal disorder    Menorrhagia    Migraine    Muscle spasm    trapezius   Neck pain, acute    Obesity    Seborrheic dermatitis    Thyroid disorder    Past Surgical History:  Procedure Laterality Date   DILATION AND CURETTAGE OF UTERUS  04/09   ENDOMETRIAL ABLATION  04/09   GREAT TOE ARTHRODESIS, METATARSALPHALANGEAL JOINT  6/07   Rt foot   HEEL SPUR SURGERY  11/87   Lt foot   ICD GENERATOR CHANGEOUT N/A 01/18/2022   Procedure: ICD GENERATOR CHANGEOUT;  Surgeon: Regan Lemming, MD;  Location: [facility] INVASIVE CV LAB;  Service: Cardiovascular;  Laterality: N/A;   TUBAL LIGATION  11/90   Patient Active Problem List   Diagnosis Date Noted   Chronic right hip pain 08/05/2022   Recurrent BV (bacterial vaginosis) 07/02/2022   Patellofemoral pain syndrome of both knees 05/01/2022   Yeast detected 04/11/2022   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 04/08/2022   Chronic systolic heart failure (HCC) 01/08/2022   Abdominal pain, acute, generalized 08/08/2021   Right ureteral stone 08/08/2021   Left foot pain 07/29/2021   Cervical radiculopathy 07/29/2021    Vaginal discharge 06/29/2021   Encounter for removal of ureteral stent 06/13/2021   Depression 06/30/2018   Presence of cardiac pacemaker 03/22/2015   Fatty liver 03/22/2015   Fibromyalgia 09/30/2012   Hyperthyroidism 02/18/2011   Anemia 02/25/2008   MENORRHAGIA 02/25/2008   MENOPAUSAL DISORDER 08/10/2007   OBESITY NOS 03/05/2007   Back pain 03/05/2007   MUSCLE SPASM, TRAPEZIUS 01/26/2007   MIGRAINE, COMMON W/O INTRACTABLE MIGRAINE 10/29/2006   GOITER, NONTOXIC MULTINODULAR 10/06/2006   HYPERCHOLESTEROLEMIA 09/30/2006   HYPERTENSION, BENIGN SYSTEMIC 09/30/2006    REFERRING PROVIDER: Benjamin Stain  REFERRING DIAG: Chronic Rt hip pain  THERAPY DIAG:  Muscle weakness (generalized)  Pain in right hip  Other symptoms and signs involving the musculoskeletal system  Rationale for Evaluation and Treatment Rehabilitation  ONSET DATE: 07/2022  SUBJECTIVE:   SUBJECTIVE STATEMENT: Pt states she sees MD next week for injection. States she has been doing some walking at home. Low back is bothering her today  PERTINENT HISTORY: Pt has neck and back pain, knee pain. Lt heel spurs  PAIN:  08/15/22 Are you having pain? Yes: NPRS scale: 5/10 Pain location: low back Pain description: "you know it's there" Aggravating factors: standing, walking Relieving  factors: icy hot  PRECAUTIONS: pacemaker - no estim  PATIENT GOALS reduce pain   OBJECTIVE:   PALPATION: TTP Rt greater trochanter, Rt iliac crest, glutes  LOWER EXTREMITY ROM:  Active ROM Right eval Left eval  Hip flexion    Hip extension    Hip abduction    Hip adduction    Hip internal rotation Limited 25% Limited 25%  Hip external rotation Limited 25% WFL  Knee flexion    Knee extension    Ankle dorsiflexion    Ankle plantarflexion    Ankle inversion    Ankle eversion     (Blank rows = not tested)  LOWER EXTREMITY MMT:  MMT Right eval Left eval Right 9/1 Left 9/1  Hip flexion 3/5 3/5 3+ 3+  Hip  extension 3/5 3/5 3 3+  Hip abduction 3+/5 3+/5 4- 4-  Hip adduction      Hip internal rotation      Hip external rotation      Knee flexion      Knee extension      Ankle dorsiflexion      Ankle plantarflexion      Ankle inversion      Ankle eversion       (Blank rows = not tested)   FUNCTIONAL TESTS:  5 times sit to stand: 14.28 seconds  (eval)   TODAY'S TREATMENT: 08/30/22 Nustep L4 x 5 min warm up  Standing hip abd x 10 bilat red TB Standing hip ext x 10 red TB Step ups 4'' step x 10 bilat forward and x 10 bilat laterally  SLR 2 x 8 bilat Bridge with red TB 2 x 10 Supine clam red TB x 20 Sidelying hip abd x 10 bilat Prone hip ext x 10 bilat Quadruped alt LE lifts x 5 bilat  Piriformis stretch 2 x 30 sec bilat Hip flexor stretch 2 x 30 sec bilat   08/23/22 Nustep L4 x 5 min warm up  Standing hip abd x 10 bilat red TB Standing hip ext x 10 bilat red TB  Bridge 2 x 10 SLR 2 x 5 bilat Sidelying hip abd 2 x 10 bilat Clam red TB 2 x 10 bilat Prone hip ext x 15 Rt, x 20 Lt Quadruped alt LE lifts 2 x 5 bilat Piriformis stretch 2 x 30 sec Hip flexor stretch 2 x 30 sec bilat Myofascial ball release glutes, piriformis, hip flexors   PATIENT EDUCATION:  Education details: PT POC and goals, HEP, ionto Person educated: Patient Education method: Explanation, Demonstration, and Handouts Education comprehension: verbalized understanding and returned demonstration   HOME EXERCISE PROGRAM: Access Code: [email or web address] URL: [email or web address] Date: 08/15/2022 Prepared by: Corlis Leak  Exercises - Active Straight Leg Raise with Quad Set  - 1 x daily - 7 x weekly - 3 sets - 10 reps - Supine Bridge  - 1 x daily - 7 x weekly - 3 sets - 10 reps - Sidelying Hip Abduction  - 1 x daily - 7 x weekly - 3 sets - 10 reps - Clamshell  - 1 x daily - 7 x weekly - 3 sets - 10 reps - Supine Piriformis Stretch with Leg Straight  - 2 x daily - 7 x weekly - 1 sets - 3 reps -  30 sec  hold - Prone Gluteal Sets  - 2 x daily - 7 x weekly - 1 sets - 10 reps - 10 sec  hold - Prone Hip Extension  - 2  x daily - 7 x weekly - 1 sets - 10 reps - 3 sec  hold - Seated Hip Flexor Stretch  - 2 x daily - 7 x weekly - 1 sets - 3 reps - 30 sec  hold - Standing Piriformis Release with Ball at Wall  - 2 x daily - 7 x weekly - 30-60 sec  hold  ASSESSMENT:  CLINICAL IMPRESSION:  Pt continues to require more sets of few reps due to pain/fatigue with exercise. She slowly building strength and muscle endurance. Added step ups today    GOALS: Goals reviewed with patient? Yes    LONG TERM GOALS:  Pt will be independent with HEP Baseline:  Goal status: INITIAL  2.  Pt will improve FOTO to >= 67 to demo improved functional mobility Baseline:  Goal status: INITIAL  3.  Pt will improve bilat LE strength to 4+/5 to tolerate walking and standing Baseline:  Goal status: INITIAL  4.  Pt will tolerate walking x 20 minutes with pain <= 1/10 Baseline:  Goal status: INITIAL   PLAN: PT FREQUENCY: 2x/week  PT DURATION: 6 weeks  PLANNED INTERVENTIONS: Therapeutic exercises, Therapeutic activity, Neuromuscular re-education, Balance training, Gait training, Patient/Family education, Self Care, Joint mobilization, Aquatic Therapy, Dry Needling, Cryotherapy, Moist heat, Taping, Ionotophoresis 4mg /ml Dexamethasone, Manual therapy, and Re-evaluation  PLAN FOR NEXT SESSION: assess and progress HEP, Aggressive hip strengthening!   Mazelle Huebert, PT 08/30/2022, 11:43 AM

## 2022-09-03 ENCOUNTER — Ambulatory Visit: Payer: 59 | Admitting: Physical Therapy

## 2022-09-03 ENCOUNTER — Ambulatory Visit (INDEPENDENT_AMBULATORY_CARE_PROVIDER_SITE_OTHER): Payer: 59

## 2022-09-03 ENCOUNTER — Encounter: Payer: Self-pay | Admitting: Physical Therapy

## 2022-09-03 ENCOUNTER — Ambulatory Visit (INDEPENDENT_AMBULATORY_CARE_PROVIDER_SITE_OTHER): Payer: 59 | Admitting: Sports Medicine

## 2022-09-03 DIAGNOSIS — G8929 Other chronic pain: Secondary | ICD-10-CM

## 2022-09-03 DIAGNOSIS — M25551 Pain in right hip: Secondary | ICD-10-CM

## 2022-09-03 DIAGNOSIS — M6281 Muscle weakness (generalized): Secondary | ICD-10-CM | POA: Diagnosis not present

## 2022-09-03 DIAGNOSIS — R29898 Other symptoms and signs involving the musculoskeletal system: Secondary | ICD-10-CM

## 2022-09-03 NOTE — Therapy (Unsigned)
OUTPATIENT PHYSICAL THERAPY   Patient Name: Alice Stewart MRN: [id number] DOB:[birth year], 66 y.o., female Today's Date: 09/03/2022   PT End of Session - 09/03/22 1403     Visit Number 6    Number of Visits 12    Date for PT Re-Evaluation 09/24/22    Authorization Type [employer] Medicare    Authorization - Visit Number 6    Progress Note Due on Visit 10    PT Start Time 1403    PT Stop Time 1445    PT Time Calculation (min) 42 min    Activity Tolerance Patient tolerated treatment well    Behavior During Therapy WFL for tasks assessed/performed                 Past Medical History:  Diagnosis Date   Anemia    Back pain    Benign positional vertigo    Cardiomyopathy (HCC)    Carpal tunnel syndrome    Goiter    nontoxid mutinodular   Hypertension    Hyperthyroidism    Insulin resistance syndrome    Menopausal disorder    Menorrhagia    Migraine    Muscle spasm    trapezius   Neck pain, acute    Obesity    Seborrheic dermatitis    Thyroid disorder    Past Surgical History:  Procedure Laterality Date   DILATION AND CURETTAGE OF UTERUS  04/09   ENDOMETRIAL ABLATION  04/09   GREAT TOE ARTHRODESIS, METATARSALPHALANGEAL JOINT  6/07   Rt foot   HEEL SPUR SURGERY  11/87   Lt foot   ICD GENERATOR CHANGEOUT N/A 01/18/2022   Procedure: ICD GENERATOR CHANGEOUT;  Surgeon: Regan Lemming, MD;  Location: 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 04/08/2022   Chronic systolic heart failure (HCC) 01/08/2022   Abdominal pain, acute, generalized 08/08/2021   Right ureteral stone 08/08/2021   Left foot pain 07/29/2021   Cervical radiculopathy 07/29/2021    Vaginal discharge 06/29/2021   Encounter for removal of ureteral stent 06/13/2021   Depression 06/30/2018   Presence of cardiac pacemaker 03/22/2015   Fatty liver 03/22/2015   Fibromyalgia 09/30/2012   Hyperthyroidism 02/18/2011   Anemia 02/25/2008   MENORRHAGIA 02/25/2008   MENOPAUSAL DISORDER 08/10/2007   OBESITY NOS 03/05/2007   Back pain 03/05/2007   MUSCLE SPASM, TRAPEZIUS 01/26/2007   MIGRAINE, COMMON W/O INTRACTABLE MIGRAINE 10/29/2006   GOITER, NONTOXIC MULTINODULAR 10/06/2006   HYPERCHOLESTEROLEMIA 09/30/2006   HYPERTENSION, BENIGN SYSTEMIC 09/30/2006    REFERRING PROVIDER: Benjamin Stain  REFERRING DIAG: Chronic Rt hip pain  THERAPY DIAG:  Muscle weakness (generalized)  Pain in right hip  Other symptoms and signs involving the musculoskeletal system  Rationale for Evaluation and Treatment Rehabilitation  ONSET DATE: 07/2022  SUBJECTIVE:   SUBJECTIVE STATEMENT: Pt states bilat hips and low back are hurting. Pt states she will be getting her injection today -- not sure if she's going to get the shot in her hip or back. Pt states she's been keeping up with her exercises at home but states sometimes she hurts worse after doing them.   PERTINENT HISTORY: Pt has neck and back pain, knee  pain. Lt heel spurs  PAIN:  Are you having pain? Yes: NPRS scale: At worst 8 or 9; currently 5/10 Pain location: low back/hips Pain description: "I can bare it" Aggravating factors: standing, walking Relieving factors: icy hot  PRECAUTIONS: pacemaker - no estim  PATIENT GOALS reduce pain   OBJECTIVE:   PALPATION: TTP Rt greater trochanter, Rt iliac crest, glutes  LOWER EXTREMITY ROM:  Active ROM Right eval Left eval  Hip flexion    Hip extension    Hip abduction    Hip adduction    Hip internal rotation Limited 25% Limited 25%  Hip external rotation Limited 25% WFL  Knee flexion    Knee extension    Ankle dorsiflexion    Ankle plantarflexion    Ankle inversion     Ankle eversion     (Blank rows = not tested)  LOWER EXTREMITY MMT:  MMT Right eval Left eval Right 9/1 Left 9/1  Hip flexion 3/5 3/5 3+ 3+  Hip extension 3/5 3/5 3 3+  Hip abduction 3+/5 3+/5 4- 4-  Hip adduction      Hip internal rotation      Hip external rotation      Knee flexion      Knee extension      Ankle dorsiflexion      Ankle plantarflexion      Ankle inversion      Ankle eversion       (Blank rows = not tested)   FUNCTIONAL TESTS:  5 times sit to stand: 14.28 seconds  (eval)   TODAY'S TREATMENT: 09/03/22 Nustep L4 x 5 min warm up  Seated Pball roll out and then lateral flexion x20 sec Piriformis stretch 2x20 sec Hamstring stretch 2x20 sec Hip IR with ball squeeze 2x10  Standing Standing hip abd x15 red TB Standing hip ext x15 red TB Marching 2x5 Side lunge with wash rag slide out x10 Mini squat x5   08/30/22 Nustep L4 x 5 min warm up  Standing hip abd x 10 bilat red TB Standing hip ext x 10 red TB Step ups 4'' step x 10 bilat forward and x 10 bilat laterally  SLR 2 x 8 bilat Bridge with red TB 2 x 10 Supine clam red TB x 20 Sidelying hip abd x 10 bilat Prone hip ext x 10 bilat Quadruped alt LE lifts x 5 bilat  Piriformis stretch 2 x 30 sec bilat Hip flexor stretch 2 x 30 sec bilat   08/23/22 Nustep L4 x 5 min warm up  Standing hip abd x 10 bilat red TB Standing hip ext x 10 bilat red TB  Bridge 2 x 10 SLR 2 x 5 bilat Sidelying hip abd 2 x 10 bilat Clam red TB 2 x 10 bilat Prone hip ext x 15 Rt, x 20 Lt Quadruped alt LE lifts 2 x 5 bilat Piriformis stretch 2 x 30 sec Hip flexor stretch 2 x 30 sec bilat Myofascial ball release glutes, piriformis, hip flexors   PATIENT EDUCATION:  Education details: PT POC and goals, HEP, ionto Person educated: Patient Education method: Explanation, Demonstration, and Handouts Education comprehension: verbalized understanding and returned demonstration   HOME EXERCISE PROGRAM: Access  Code: [email or web address] URL: [email or web address] Date: 08/15/2022 Prepared by: Corlis Leak  Exercises - Active Straight Leg Raise with Quad Set  - 1 x daily - 7 x weekly - 3 sets - 10 reps - Supine Bridge  - 1 x daily - 7 x weekly - 3  sets - 10 reps - Sidelying Hip Abduction  - 1 x daily - 7 x weekly - 3 sets - 10 reps - Clamshell  - 1 x daily - 7 x weekly - 3 sets - 10 reps - Supine Piriformis Stretch with Leg Straight  - 2 x daily - 7 x weekly - 1 sets - 3 reps - 30 sec  hold - Prone Gluteal Sets  - 2 x daily - 7 x weekly - 1 sets - 10 reps - 10 sec  hold - Prone Hip Extension  - 2 x daily - 7 x weekly - 1 sets - 10 reps - 3 sec  hold - Seated Hip Flexor Stretch  - 2 x daily - 7 x weekly - 1 sets - 3 reps - 30 sec  hold - Standing Piriformis Release with Ball at Wall  - 2 x daily - 7 x weekly - 30-60 sec  hold  ASSESSMENT:  CLINICAL IMPRESSION:  Pt with continued hip/back pain and soreness. Continued to progress exercises as pt tolerates. Limited due to knee pain this session when attempting mini squats. Discussed other pain management techniques such as stretching, heat, self massage, pain relieving cream, and TPDN. Pt to see Dr.     Dolores Lory: Goals reviewed with patient? Yes    LONG TERM GOALS:  Pt will be independent with HEP Baseline:  Goal status: INITIAL  2.  Pt will improve FOTO to >= 67 to demo improved functional mobility Baseline:  Goal status: INITIAL  3.  Pt will improve bilat LE strength to 4+/5 to tolerate walking and standing Baseline:  Goal status: INITIAL  4.  Pt will tolerate walking x 20 minutes with pain <= 1/10 Baseline:  Goal status: INITIAL   PLAN: PT FREQUENCY: 2x/week  PT DURATION: 6 weeks  PLANNED INTERVENTIONS: Therapeutic exercises, Therapeutic activity, Neuromuscular re-education, Balance training, Gait training, Patient/Family education, Self Care, Joint mobilization, Aquatic Therapy, Dry Needling, Cryotherapy, Moist heat, Taping,  Ionotophoresis 4mg /ml Dexamethasone, Manual therapy, and Re-evaluation  PLAN FOR NEXT SESSION: assess and progress HEP, Aggressive hip strengthening!   Charlye Spare April Ma L Muneeb Veras, PT 09/03/2022, 2:04 PM

## 2022-09-03 NOTE — Progress Notes (Signed)
    Procedures performed today:    Procedure: Real-time Ultrasound Guided injection of the right greater trochanter bursa Device: Samsung HS60  Verbal informed consent obtained.  Time-out conducted.  Noted no overlying erythema, induration, or other signs of local infection.  Skin prepped in a sterile fashion.  Local anesthesia: Topical Ethyl chloride.  With sterile technique and under real time ultrasound guidance: Noted normal-appearing bursa and hip abductors, 1 cc Kenalog 40, 2 cc lidocaine, 2 cc bupivacaine injected easily Completed without difficulty  Advised to call if fevers/chills, erythema, induration, drainage, or persistent bleeding.  Images permanently stored and available for review in PACS.  Impression: Technically successful ultrasound guided injection.  Independent interpretation of notes and tests performed by another provider:   None.  Brief History, Exam, Impression, and Recommendations:    Chronic right hip pain This is a pleasant 66 year old female, she has a long history of right lateral hip pain with tenderness over the greater trochanter and hip abductor weakness, we did start her out with some steroids, physical therapy, unfortunately she tells me that PT is worsening her hip pain and she has not been able to do it much. She is hoping for an injection today, she still has tenderness directly over the greater trochanter, today we did a greater trochanteric bursa as well as a hip abductor injection, return to see me in approximately 4 to 6 weeks. Of note she did have some pain in her low back, she I do suspect she has at least 2 pain generators here.    ____________________________________________ Ihor Austin. Benjamin Stain, M.D., ABFM., CAQSM., AME. Primary Care and Sports Medicine [facility]  Adjunct Professor of Family Medicine  [employer]  Restaurant manager, fast food

## 2022-09-03 NOTE — Assessment & Plan Note (Signed)
This is a pleasant 66 year old female, she has a long history of right lateral hip pain with tenderness over the greater trochanter and hip abductor weakness, we did start her out with some steroids, physical therapy, unfortunately she tells me that PT is worsening her hip pain and she has not been able to do it much. She is hoping for an injection today, she still has tenderness directly over the greater trochanter, today we did a greater trochanteric bursa as well as a hip abductor injection, return to see me in approximately 4 to 6 weeks. Of note she did have some pain in her low back, she I do suspect she has at least 2 pain generators here.

## 2022-09-06 ENCOUNTER — Ambulatory Visit: Payer: 59 | Admitting: Physical Therapy

## 2022-09-06 ENCOUNTER — Encounter: Payer: Self-pay | Admitting: Physical Therapy

## 2022-09-06 DIAGNOSIS — R29898 Other symptoms and signs involving the musculoskeletal system: Secondary | ICD-10-CM

## 2022-09-06 DIAGNOSIS — M25551 Pain in right hip: Secondary | ICD-10-CM

## 2022-09-06 DIAGNOSIS — M6281 Muscle weakness (generalized): Secondary | ICD-10-CM

## 2022-09-06 NOTE — Therapy (Signed)
OUTPATIENT PHYSICAL THERAPY   Patient Name: Alice Stewart MRN: [id number] DOB:August 18, 1956, 66 y.o., female Today's Date: 09/06/2022   PT End of Session - 09/06/22 1134     Visit Number 7    Number of Visits 12    Date for PT Re-Evaluation 09/24/22    Authorization Type [employer]HC Medicare    Authorization - Visit Number 7    Progress Note Due on Visit 10    PT Start Time 1100    PT Stop Time 1141    PT Time Calculation (min) 41 min    Activity Tolerance Patient tolerated treatment well    Behavior During Therapy WFL for tasks assessed/performed                  Past Medical History:  Diagnosis Date   Anemia    Back pain    Benign positional vertigo    Cardiomyopathy (HCC)    Carpal tunnel syndrome    Goiter    nontoxid mutinodular   Hypertension    Hyperthyroidism    Insulin resistance syndrome    Menopausal disorder    Menorrhagia    Migraine    Muscle spasm    trapezius   Neck pain, acute    Obesity    Seborrheic dermatitis    Thyroid disorder    Past Surgical History:  Procedure Laterality Date   DILATION AND CURETTAGE OF UTERUS  04/09   ENDOMETRIAL ABLATION  04/09   GREAT TOE ARTHRODESIS, METATARSALPHALANGEAL JOINT  6/07   Rt foot   HEEL SPUR SURGERY  11/87   Lt foot   ICD GENERATOR CHANGEOUT N/A 01/18/2022   Procedure: ICD GENERATOR CHANGEOUT;  Surgeon: Regan Lemming, MD;  Location: [facility] INVASIVE CV LAB;  Service: Cardiovascular;  Laterality: N/A;   TUBAL LIGATION  11/90   Patient Active Problem List   Diagnosis Date Noted   Chronic right hip pain 08/05/2022   Recurrent BV (bacterial vaginosis) 07/02/2022   Patellofemoral pain syndrome of both knees 05/01/2022   Yeast detected 04/11/2022   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 04/08/2022   Chronic systolic heart failure (HCC) 01/08/2022   Abdominal pain, acute, generalized 08/08/2021   Right ureteral stone 08/08/2021   Left foot pain 07/29/2021   Cervical radiculopathy 07/29/2021    Vaginal discharge 06/29/2021   Encounter for removal of ureteral stent 06/13/2021   Depression 06/30/2018   Presence of cardiac pacemaker 03/22/2015   Fatty liver 03/22/2015   Fibromyalgia 09/30/2012   Hyperthyroidism 02/18/2011   Anemia 02/25/2008   MENORRHAGIA 02/25/2008   MENOPAUSAL DISORDER 08/10/2007   OBESITY NOS 03/05/2007   Back pain 03/05/2007   MUSCLE SPASM, TRAPEZIUS 01/26/2007   MIGRAINE, COMMON W/O INTRACTABLE MIGRAINE 10/29/2006   GOITER, NONTOXIC MULTINODULAR 10/06/2006   HYPERCHOLESTEROLEMIA 09/30/2006   HYPERTENSION, BENIGN SYSTEMIC 09/30/2006    REFERRING PROVIDER: Benjamin Stain  REFERRING DIAG: Chronic Rt hip pain  THERAPY DIAG:  Muscle weakness (generalized)  Pain in right hip  Other symptoms and signs involving the musculoskeletal system  Rationale for Evaluation and Treatment Rehabilitation  ONSET DATE: 07/2022  SUBJECTIVE:   SUBJECTIVE STATEMENT: Pt states she had an injection on Wednesday. She states it helped for the "first few hours" but she is still having pain  PERTINENT HISTORY: Pt has neck and back pain, knee pain. Lt heel spurs  PAIN:  Are you having pain? Yes: NPRS scale: 4/10 Pain location: low back/hips Pain description: "I can bare it" Aggravating factors: standing, walking Relieving  factors: icy hot  PRECAUTIONS: pacemaker - no estim  PATIENT GOALS reduce pain   OBJECTIVE:   PALPATION: TTP Rt greater trochanter, Rt iliac crest, glutes  LOWER EXTREMITY ROM:  Active ROM Right eval Left eval  Hip flexion    Hip extension    Hip abduction    Hip adduction    Hip internal rotation Limited 25% Limited 25%  Hip external rotation Limited 25% WFL  Knee flexion    Knee extension    Ankle dorsiflexion    Ankle plantarflexion    Ankle inversion    Ankle eversion     (Blank rows = not tested)  LOWER EXTREMITY MMT:  MMT Right eval Left eval Right 9/1 Left 9/1  Hip flexion 3/5 3/5 3+ 3+  Hip extension 3/5 3/5 3 3+   Hip abduction 3+/5 3+/5 4- 4-  Hip adduction      Hip internal rotation      Hip external rotation      Knee flexion      Knee extension      Ankle dorsiflexion      Ankle plantarflexion      Ankle inversion      Ankle eversion       (Blank rows = not tested)   FUNCTIONAL TESTS:  5 times sit to stand: 14.28 seconds  (eval)   TODAY'S TREATMENT: 09/06/22 Nustep L5 x 5 min warm up  Standing: Hip abduction red TB 3 x 5 Hip extension red TB 3 x 5 Marching 3 x 5   Seated: Piriformis stretch 2 x 30 sec HS stretch 2 x 30 Hip IR with ball squeeze 3 x 5  Prone: Hip ext 2 x 5 bilat, then hip ext with knee flexed x 5 bilat  Manual: STM as tolerated to Rt ITB and glutes  Moist Heat x 10 minutes   09/03/22 Nustep L4 x 5 min warm up  Seated Pball roll out and then lateral flexion x20 sec Piriformis stretch 2x20 sec Hamstring stretch 2x20 sec Hip IR with ball squeeze 2x10  Standing Standing hip abd x15 red TB Standing hip ext x15 red TB Marching 2x5 Side lunge with wash rag slide out x10 Mini squat x5   08/30/22 Nustep L4 x 5 min warm up  Standing hip abd x 10 bilat red TB Standing hip ext x 10 red TB Step ups 4'' step x 10 bilat forward and x 10 bilat laterally  SLR 2 x 8 bilat Bridge with red TB 2 x 10 Supine clam red TB x 20 Sidelying hip abd x 10 bilat Prone hip ext x 10 bilat Quadruped alt LE lifts x 5 bilat  Piriformis stretch 2 x 30 sec bilat Hip flexor stretch 2 x 30 sec bilat    PATIENT EDUCATION:  Education details: PT POC and goals, HEP, ionto Person educated: Patient Education method: Explanation, Demonstration, and Handouts Education comprehension: verbalized understanding and returned demonstration   HOME EXERCISE PROGRAM: Access Code: [email or web address] URL: [email or web address] Date: 08/15/2022 Prepared by: Corlis Leak  Exercises - Active Straight Leg Raise with Quad Set  - 1 x daily - 7 x weekly - 3 sets - 10 reps - Supine  Bridge  - 1 x daily - 7 x weekly - 3 sets - 10 reps - Sidelying Hip Abduction  - 1 x daily - 7 x weekly - 3 sets - 10 reps - Clamshell  - 1 x daily - 7 x weekly - 3 sets - 10  reps - Supine Piriformis Stretch with Leg Straight  - 2 x daily - 7 x weekly - 1 sets - 3 reps - 30 sec  hold - Prone Gluteal Sets  - 2 x daily - 7 x weekly - 1 sets - 10 reps - 10 sec  hold - Prone Hip Extension  - 2 x daily - 7 x weekly - 1 sets - 10 reps - 3 sec  hold - Seated Hip Flexor Stretch  - 2 x daily - 7 x weekly - 1 sets - 3 reps - 30 sec  hold - Standing Piriformis Release with Ball at Wall  - 2 x daily - 7 x weekly - 30-60 sec  hold  ASSESSMENT:  CLINICAL IMPRESSION:  Trial of moist heat and manual therapy for pain relief this visit. Continued with resisted hip exercises. PT continues to educate pt on importance of performing exercises at home    GOALS: Goals reviewed with patient? Yes    LONG TERM GOALS:  Pt will be independent with HEP Baseline:  Goal status: INITIAL  2.  Pt will improve FOTO to >= 67 to demo improved functional mobility Baseline:  Goal status: INITIAL  3.  Pt will improve bilat LE strength to 4+/5 to tolerate walking and standing Baseline:  Goal status: INITIAL  4.  Pt will tolerate walking x 20 minutes with pain <= 1/10 Baseline:  Goal status: INITIAL   PLAN: PT FREQUENCY: 2x/week  PT DURATION: 6 weeks  PLANNED INTERVENTIONS: Therapeutic exercises, Therapeutic activity, Neuromuscular re-education, Balance training, Gait training, Patient/Family education, Self Care, Joint mobilization, Aquatic Therapy, Dry Needling, Cryotherapy, Moist heat, Taping, Ionotophoresis 4mg /ml Dexamethasone, Manual therapy, and Re-evaluation  PLAN FOR NEXT SESSION: assess and progress HEP, Aggressive hip strengthening!   Danitza Schoenfeldt, PT 09/06/2022, 11:35 AM

## 2022-09-09 ENCOUNTER — Ambulatory Visit (INDEPENDENT_AMBULATORY_CARE_PROVIDER_SITE_OTHER): Payer: 59 | Admitting: *Deleted

## 2022-09-09 ENCOUNTER — Telehealth: Payer: Self-pay | Admitting: Cardiology

## 2022-09-09 ENCOUNTER — Encounter: Payer: Self-pay | Admitting: Physical Therapy

## 2022-09-09 ENCOUNTER — Ambulatory Visit: Payer: 59 | Admitting: Physical Therapy

## 2022-09-09 ENCOUNTER — Other Ambulatory Visit (HOSPITAL_COMMUNITY)
Admission: RE | Admit: 2022-09-09 | Discharge: 2022-09-09 | Disposition: A | Discharge status: Home / Self Care | Payer: 59 | Source: Ambulatory Visit | Attending: Obstetrics & Gynecology | Admitting: Obstetrics & Gynecology

## 2022-09-09 DIAGNOSIS — B9689 Other specified bacterial agents as the cause of diseases classified elsewhere: Secondary | ICD-10-CM

## 2022-09-09 DIAGNOSIS — M25551 Pain in right hip: Secondary | ICD-10-CM

## 2022-09-09 DIAGNOSIS — M6281 Muscle weakness (generalized): Secondary | ICD-10-CM

## 2022-09-09 DIAGNOSIS — N760 Acute vaginitis: Secondary | ICD-10-CM | POA: Diagnosis present

## 2022-09-09 DIAGNOSIS — R29898 Other symptoms and signs involving the musculoskeletal system: Secondary | ICD-10-CM

## 2022-09-09 NOTE — Telephone Encounter (Signed)
Attempted to contact patient to discuss blood pressure.   Left call back number.

## 2022-09-09 NOTE — Progress Notes (Unsigned)
   Acute Office Visit  Subjective:     Patient ID: Alice Stewart, female    DOB: [birth year], 66 y.o.   MRN: [id number]  No chief complaint on file.   HPI Patient is in today for uncontrolled HTN. She was previously at her gynecologist's office when they noticed her blood pressure was elevated. She is currently on losartan 100mg , labetolol 600mg , flecainide 100mg , and lasix 20mg .  ROS      Objective:    LMP 09/27/2011  {Vitals History (Optional):23777}  Physical Exam  No results found for any visits on 09/10/22.      Assessment & Plan:   Problem List Items Addressed This Visit   None   No orders of the defined types were placed in this encounter.   No follow-ups on file.  Owens Loffler, DO

## 2022-09-09 NOTE — Therapy (Signed)
OUTPATIENT PHYSICAL THERAPY   Patient Name: Alice Stewart MRN: [id number] DOB:17-Sep-1956, 66 y.o., female Today's Date: 09/09/2022   PT End of Session - 09/09/22 1322     Visit Number 8    Number of Visits 12    Date for PT Re-Evaluation 09/24/22    Authorization Type UHC Medicare    Authorization - Visit Number 8    Progress Note Due on Visit 10    PT Start Time [id number]    PT Stop Time 1400    PT Time Calculation (min) 38 min    Activity Tolerance Patient tolerated treatment well    Behavior During Therapy WFL for tasks assessed/performed                  Past Medical History:  Diagnosis Date   Anemia    Back pain    Benign positional vertigo    Cardiomyopathy ([place])    Carpal tunnel syndrome    Goiter    nontoxid mutinodular   Hypertension    Hyperthyroidism    Insulin resistance syndrome    Menopausal disorder    Menorrhagia    Migraine    Muscle spasm    trapezius   Neck pain, acute    Obesity    Seborrheic dermatitis    Thyroid disorder    Past Surgical History:  Procedure Laterality Date   DILATION AND CURETTAGE OF UTERUS  04/09   ENDOMETRIAL ABLATION  04/09   GREAT TOE ARTHRODESIS, METATARSALPHALANGEAL JOINT  6/07   Rt foot   HEEL SPUR SURGERY  11/87   Lt foot   ICD GENERATOR CHANGEOUT N/A 01/18/2022   Procedure: ICD GENERATOR CHANGEOUT;  Surgeon: Constance Haw, MD;  Location: [facility];  Service: Cardiovascular;  Laterality: N/A;   TUBAL LIGATION  11/90   Patient Active Problem List   Diagnosis Date Noted   Chronic right hip pain 08/05/2022   Recurrent BV (bacterial vaginosis) 07/02/2022   Patellofemoral pain syndrome of both knees 05/01/2022   Yeast detected 04/11/2022   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 [id number]   Chronic systolic heart failure ([place]) 01/08/2022   Abdominal pain, acute, generalized 08/08/2021   Right ureteral stone 08/08/2021   Left foot pain 07/29/2021   Cervical radiculopathy 07/29/2021    Vaginal discharge 06/29/2021   Encounter for removal of ureteral stent 06/13/2021   Depression 06/30/2018   Presence of cardiac pacemaker 03/22/2015   Fatty liver 03/22/2015   Fibromyalgia 09/30/2012   Hyperthyroidism 02/18/2011   Anemia 02/25/2008   MENORRHAGIA 02/25/2008   MENOPAUSAL DISORDER 08/10/2007   OBESITY NOS 03/05/2007   Back pain 03/05/2007   MUSCLE SPASM, TRAPEZIUS 01/26/2007   MIGRAINE, COMMON W/O INTRACTABLE MIGRAINE 10/29/2006   GOITER, NONTOXIC MULTINODULAR 10/06/2006   HYPERCHOLESTEROLEMIA 09/30/2006   HYPERTENSION, BENIGN SYSTEMIC 09/30/2006    REFERRING PROVIDER: Dianah Field  REFERRING DIAG: Chronic Rt hip pain  THERAPY DIAG:  Muscle weakness (generalized)  Pain in right hip  Other symptoms and signs involving the musculoskeletal system  Rationale for Evaluation and Treatment Rehabilitation  ONSET DATE: 07/2022  SUBJECTIVE:   SUBJECTIVE STATEMENT: Pt states that the heat and manual work felt good at the time. States that when she returned home her hips started to hurt.   PERTINENT HISTORY: Pt has neck and back pain, knee pain. Lt heel spurs  PAIN:  Are you having pain? Yes: NPRS scale: 4/10 Pain location: low back/hips Pain description: "I can bare it" Aggravating factors: standing,  walking Relieving factors: icy hot  PRECAUTIONS: pacemaker - no estim  PATIENT GOALS reduce pain   OBJECTIVE:   PALPATION: TTP Rt greater trochanter, Rt iliac crest, glutes  LOWER EXTREMITY ROM:  Active ROM Right eval Left eval  Hip flexion    Hip extension    Hip abduction    Hip adduction    Hip internal rotation Limited 25% Limited 25%  Hip external rotation Limited 25% WFL  Knee flexion    Knee extension    Ankle dorsiflexion    Ankle plantarflexion    Ankle inversion    Ankle eversion     (Blank rows = not tested)  LOWER EXTREMITY MMT:  MMT Right eval Left eval Right 9/1 Left 9/1  Hip flexion 3/5 3/5 3+ 3+  Hip extension 3/5 3/5 3  3+  Hip abduction 3+/5 3+/5 4- 4-  Hip adduction      Hip internal rotation      Hip external rotation      Knee flexion      Knee extension      Ankle dorsiflexion      Ankle plantarflexion      Ankle inversion      Ankle eversion       (Blank rows = not tested)   FUNCTIONAL TESTS:  5 times sit to stand: 14.28 seconds  (eval)   TODAY'S TREATMENT: 09/09/22 Seated Figure 4 stretch x30 sec HS stretch 2x30 sec  Supine Clamshell green TB 2x10 Marching with PPT green TB 2x10 Beginner bridging x10 Low trunk 2x30 sec  Prone Hip extension x10 Knee fall out 2x10   09/06/22 Nustep L5 x 5 min warm up  Standing: Hip abduction red TB 3 x 5 Hip extension red TB 3 x 5 Marching 3 x 5   Seated: Piriformis stretch 2 x 30 sec HS stretch 2 x 30 Hip IR with ball squeeze 3 x 5  Prone: Hip ext 2 x 5 bilat, then hip ext with knee flexed x 5 bilat  Manual: STM as tolerated to Rt ITB and glutes  Moist Heat x 10 minutes   09/03/22 Nustep L4 x 5 min warm up  Seated Pball roll out and then lateral flexion x20 sec Piriformis stretch 2x20 sec Hamstring stretch 2x20 sec Hip IR with ball squeeze 2x10  Standing Standing hip abd x15 red TB Standing hip ext x15 red TB Marching 2x5 Side lunge with wash rag slide out x10 Mini squat x5     PATIENT EDUCATION:  Education details: PT POC and goals, HEP, ionto Person educated: Patient Education method: Explanation, Demonstration, and Handouts Education comprehension: verbalized understanding and returned demonstration   HOME EXERCISE PROGRAM: Access Code: [email or web address] URL: [email or web address] Date: 08/15/2022 Prepared by: Gillermo Murdoch  Exercises - Active Straight Leg Raise with Quad Set  - 1 x daily - 7 x weekly - 3 sets - 10 reps - Supine Bridge  - 1 x daily - 7 x weekly - 3 sets - 10 reps - Sidelying Hip Abduction  - 1 x daily - 7 x weekly - 3 sets - 10 reps - Clamshell  - 1 x daily - 7 x weekly - 3 sets -  10 reps - Supine Piriformis Stretch with Leg Straight  - 2 x daily - 7 x weekly - 1 sets - 3 reps - 30 sec  hold - Prone Gluteal Sets  - 2 x daily - 7 x weekly - 1 sets - 10 reps - 10  sec  hold - Prone Hip Extension  - 2 x daily - 7 x weekly - 1 sets - 10 reps - 3 sec  hold - Seated Hip Flexor Stretch  - 2 x daily - 7 x weekly - 1 sets - 3 reps - 30 sec  hold - Standing Piriformis Release with Ball at [place]  - 2 x daily - 7 x weekly - 30-60 sec  hold  ASSESSMENT:  CLINICAL IMPRESSION:  Continued with resisted hip exercises. Encouraged pt to continue strengthening at home. Glutes fatigue quickly with prone extensions (only able to tolerate x10 reps).     GOALS: Goals reviewed with patient? Yes    LONG TERM GOALS:  Pt will be independent with HEP Baseline:  Goal status: INITIAL  2.  Pt will improve FOTO to >= 67 to demo improved functional mobility Baseline:  Goal status: INITIAL  3.  Pt will improve bilat LE strength to 4+/5 to tolerate walking and standing Baseline:  Goal status: INITIAL  4.  Pt will tolerate walking x 20 minutes with pain <= 1/10 Baseline:  Goal status: INITIAL   PLAN: PT FREQUENCY: 2x/week  PT DURATION: 6 weeks  PLANNED INTERVENTIONS: Therapeutic exercises, Therapeutic activity, Neuromuscular re-education, Balance training, Gait training, Patient/Family education, Self Care, Joint mobilization, Aquatic Therapy, Dry Needling, Cryotherapy, Moist heat, Taping, Ionotophoresis 4mg /ml Dexamethasone, Manual therapy, and Re-evaluation  PLAN FOR NEXT SESSION: assess and progress HEP, Aggressive hip strengthening!   Meir Elwood April Ma L Nephtali Docken, PT 09/09/2022, 1:22 PM

## 2022-09-09 NOTE — Progress Notes (Cosign Needed)
TOC for BV only per Dr Gala Romney. BP BOFBP102/58 P-60.  Pt states she had a H/A this morning.  I encouraged pt to go over to PCP in building and let them know about her BP today.  She is going over there.

## 2022-09-09 NOTE — Telephone Encounter (Signed)
Pt c/o BP issue: STAT if pt c/o blurred vision, one-sided weakness or slurred speech  1. What are your last 5 BP readings? 197/78 Was taken at GYN today 09/18  2. Are you having any other symptoms (ex. Dizziness, headache, blurred vision, passed out)? No  3. What is your BP issue? Pt states that her bp has been high after receiving new medication from her last visit. She states that she has an appt with her PCP tomorrow to see if they can help, but she wanted her cardiologist to know.

## 2022-09-09 NOTE — Telephone Encounter (Signed)
Patient returned call- she states that she went to PT today and went over to her obgyn after that- they checked her BP and it was 197/78. She believes her blood pressure has been running on the higher side but is unable to check her blood pressure at home. She believes she is having side effects from her labetalol. I did advise of the importance of checking blood pressure at home and advised to get a machine to check. Patient verbalized understanding of this. She is going to her PCP office tomorrow to do a blood pressure check. I did advise to send [place] that information over to MD for review. Patient is not having any symptoms at this time.  Patient offered an appointment for this week to discuss, but she would like to wait and see what her PCP check is for tomorrow and send over that information. She has appointments everyday this week already and did not want another one.   I will route to MD to review, and for FYI with BP check.

## 2022-09-10 ENCOUNTER — Ambulatory Visit (INDEPENDENT_AMBULATORY_CARE_PROVIDER_SITE_OTHER): Payer: 59 | Admitting: Family Medicine

## 2022-09-10 VITALS — BP 142/70 | HR 60 | Ht 64.0 in | Wt 249.0 lb

## 2022-09-10 DIAGNOSIS — I10 Essential (primary) hypertension: Secondary | ICD-10-CM

## 2022-09-10 DIAGNOSIS — N3946 Mixed incontinence: Secondary | ICD-10-CM

## 2022-09-10 LAB — POCT URINALYSIS DIP (CLINITEK)
Blood, UA: NEGATIVE
Glucose, UA: NEGATIVE mg/dL
Leukocytes, UA: NEGATIVE
Nitrite, UA: NEGATIVE
POC PROTEIN,UA: 30 — AB
Spec Grav, UA: >=1.03 — AB (ref 1.010–1.025)
Urobilinogen, UA: 0.2 E.U./dL
pH, UA: 5.5 (ref 5.0–8.0)

## 2022-09-10 LAB — CERVICOVAGINAL ANCILLARY ONLY
Bacterial Vaginitis (gardnerella): NEGATIVE
Comment: NEGATIVE

## 2022-09-10 NOTE — Assessment & Plan Note (Signed)
>>  ASSESSMENT AND PLAN FOR MIXED INCONTINENCE WRITTEN ON 09/10/2022  3:55 PM BY WACHS, ERIKA S, DO  - ordered a UA to rule out infection. UA negative for leukocytes and nitrites.  - do believe this could be related to pelvic floor weakness. Have sent a referral into pelvic floor therapy. Pt has already been referred to urogyn by gynecology for mixed incontinence - will reach out to cardiology and see if any of her heart meds could be the cause--at my brief glance I do not see any of these causing incontinence but I am unsure about the labetalol at the 600mg  dose.

## 2022-09-10 NOTE — Assessment & Plan Note (Addendum)
-   ordered a UA to rule out infection. UA negative for leukocytes and nitrites.  - do believe this could be related to pelvic floor weakness. Have sent a referral into pelvic floor therapy. Pt has already been referred to urogyn by gynecology for mixed incontinence - will reach out to cardiology and see if any of her heart meds could be the cause--at my brief glance I do not see any of these causing incontinence but I am unsure about the labetalol at the 600mg  dose.

## 2022-09-10 NOTE — Assessment & Plan Note (Addendum)
-   BP well controlled today at 142/70. She is concerned some of her urinary symptoms are a result of her blood pressure medications. I will reach out to cardiology and update them on today's visit  - denied chest pain, shortness of breath, blurry vision

## 2022-09-11 ENCOUNTER — Other Ambulatory Visit: Payer: Self-pay | Admitting: Obstetrics & Gynecology

## 2022-09-11 ENCOUNTER — Telehealth: Payer: Self-pay | Admitting: Family Medicine

## 2022-09-11 ENCOUNTER — Ambulatory Visit: Payer: 59 | Admitting: Physical Therapy

## 2022-09-11 ENCOUNTER — Encounter: Payer: Self-pay | Admitting: Physical Therapy

## 2022-09-11 DIAGNOSIS — M6281 Muscle weakness (generalized): Secondary | ICD-10-CM

## 2022-09-11 DIAGNOSIS — N3946 Mixed incontinence: Secondary | ICD-10-CM

## 2022-09-11 DIAGNOSIS — M25551 Pain in right hip: Secondary | ICD-10-CM

## 2022-09-11 DIAGNOSIS — R29898 Other symptoms and signs involving the musculoskeletal system: Secondary | ICD-10-CM

## 2022-09-11 MED ORDER — METRONIDAZOLE 0.75 % VA GEL: VAGINAL | 5 refills | Status: DC

## 2022-09-11 NOTE — Telephone Encounter (Signed)
Left message for patient that it is not labetalol causing urinary symptoms. Have also told her I changed the referral with pelvic floor PT. If she would like we can try mybetriq medication for urinary symptoms or see how PT goes.

## 2022-09-11 NOTE — Progress Notes (Signed)
BV cleared with cleocin.  Pt will need extended treatment to prevent recurrence. Metrogel twice weekly sent to pharmacy.

## 2022-09-11 NOTE — Therapy (Signed)
OUTPATIENT PHYSICAL THERAPY   Patient Name: Alice Stewart MRN: [id number] DOB:1955-12-30, 66 y.o., female Today's Date: 09/11/2022   PT End of Session - 09/11/22 1326     Visit Number 9    Number of Visits 12    Date for PT Re-Evaluation 09/24/22    Authorization Type UHC Medicare    Authorization - Visit Number 9    Progress Note Due on Visit 10    PT Start Time 1320    PT Stop Time 1400    PT Time Calculation (min) 40 min    Activity Tolerance Patient tolerated treatment well    Behavior During Therapy WFL for tasks assessed/performed                  Past Medical History:  Diagnosis Date   Anemia    Back pain    Benign positional vertigo    Cardiomyopathy ([facility])    Carpal tunnel syndrome    Goiter    nontoxid mutinodular   Hypertension    Hyperthyroidism    Insulin resistance syndrome    Menopausal disorder    Menorrhagia    Migraine    Muscle spasm    trapezius   Neck pain, acute    Obesity    Seborrheic dermatitis    Thyroid disorder    Past Surgical History:  Procedure Laterality Date   DILATION AND CURETTAGE OF UTERUS  04/09   ENDOMETRIAL ABLATION  04/09   GREAT TOE ARTHRODESIS, METATARSALPHALANGEAL JOINT  6/07   Rt foot   HEEL SPUR SURGERY  11/87   Lt foot   ICD GENERATOR CHANGEOUT N/A 01/18/2022   Procedure: ICD GENERATOR CHANGEOUT;  Surgeon: Regan Lemming, MD;  Location: [facility];  Service: Cardiovascular;  Laterality: N/A;   TUBAL LIGATION  11/90   Patient Active Problem List   Diagnosis Date Noted   Mixed incontinence 09/10/2022   Chronic right hip pain 08/05/2022   Recurrent BV (bacterial vaginosis) 07/02/2022   Patellofemoral pain syndrome of both knees 05/01/2022   Yeast detected 04/11/2022   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 04/08/2022   Chronic systolic heart failure ([facility]) 01/08/2022   Abdominal pain, acute, generalized 08/08/2021   Right ureteral stone 08/08/2021   Left foot pain 07/29/2021    Cervical radiculopathy 07/29/2021   Vaginal discharge 06/29/2021   Encounter for removal of ureteral stent 06/13/2021   Depression 06/30/2018   Presence of cardiac pacemaker 03/22/2015   Fatty liver 03/22/2015   Fibromyalgia 09/30/2012   Hyperthyroidism 02/18/2011   Anemia 02/25/2008   MENORRHAGIA 02/25/2008   MENOPAUSAL DISORDER 08/10/2007   OBESITY NOS 03/05/2007   Back pain 03/05/2007   MUSCLE SPASM, TRAPEZIUS 01/26/2007   MIGRAINE, COMMON W/O INTRACTABLE MIGRAINE 10/29/2006   GOITER, NONTOXIC MULTINODULAR 10/06/2006   HYPERCHOLESTEROLEMIA 09/30/2006   Hypertension 09/30/2006    REFERRING PROVIDER: Benjamin Stain  REFERRING DIAG: Chronic Rt hip pain  THERAPY DIAG:  Muscle weakness (generalized)  Pain in right hip  Other symptoms and signs involving the musculoskeletal system  Rationale for Evaluation and Treatment Rehabilitation  ONSET DATE: 07/2022  SUBJECTIVE:   SUBJECTIVE STATEMENT: Pt states that she found out that her infection is finally gone after 6 months. Pt reports she saw MD for her urinary incontinence and recommended pelvic floor muscle strengthening. Feels that the prone exercises did help   PERTINENT HISTORY: Pt has neck and back pain, knee pain. Lt heel spurs  PAIN:  Are you having pain? Yes:  NPRS scale: 4/10 Pain location: low back/hips Pain description: "I can bare it" Aggravating factors: standing, walking Relieving factors: icy hot  PRECAUTIONS: pacemaker - no estim  PATIENT GOALS reduce pain   OBJECTIVE:   PALPATION: TTP Rt greater trochanter, Rt iliac crest, glutes  LOWER EXTREMITY ROM:  Active ROM Right eval Left eval  Hip flexion    Hip extension    Hip abduction    Hip adduction    Hip internal rotation Limited 25% Limited 25%  Hip external rotation Limited 25% WFL  Knee flexion    Knee extension    Ankle dorsiflexion    Ankle plantarflexion    Ankle inversion    Ankle eversion     (Blank rows = not tested)  LOWER  EXTREMITY MMT:  MMT Right eval Left eval Right 9/1 Left 9/1  Hip flexion 3/5 3/5 3+ 3+  Hip extension 3/5 3/5 3 3+  Hip abduction 3+/5 3+/5 4- 4-  Hip adduction      Hip internal rotation      Hip external rotation      Knee flexion      Knee extension      Ankle dorsiflexion      Ankle plantarflexion      Ankle inversion      Ankle eversion       (Blank rows = not tested)   FUNCTIONAL TESTS:  5 times sit to stand: 14.28 seconds  (eval)   TODAY'S TREATMENT: 09/11/22 Seated Figure 4 stretch x30 sec bilat Piriformis stretch x30 sec bilat Hamstring stretch x30 sec bilat Hip adduction with PFM contraction & TSA contraction 10x3 sec  Supine:  Adductor stretch with strap x30 sec bilat stretch Happy baby stretch x30 sec  Prone: Hip extension x15 Knee fall out 2x10  Sidelying: Clamshell 2x10 Hip abduction 2x5   09/09/22 Seated Figure 4 stretch x30 sec HS stretch 2x30 sec  Supine Clamshell green TB 2x10 Marching with PPT green TB 2x10 Beginner bridging x10 Low trunk 2x30 sec  Prone Hip extension x10 Knee fall out 2x10   09/06/22 Nustep L5 x 5 min warm up  Standing: Hip abduction red TB 3 x 5 Hip extension red TB 3 x 5 Marching 3 x 5   Seated: Piriformis stretch 2 x 30 sec HS stretch 2 x 30 Hip IR with ball squeeze 3 x 5  Prone: Hip ext 2 x 5 bilat, then hip ext with knee flexed x 5 bilat  Manual: STM as tolerated to Rt ITB and glutes  Moist Heat x 10 minutes   09/03/22 Nustep L4 x 5 min warm up  Seated Pball roll out and then lateral flexion x20 sec Piriformis stretch 2x20 sec Hamstring stretch 2x20 sec Hip IR with ball squeeze 2x10  Standing Standing hip abd x15 red TB Standing hip ext x15 red TB Marching 2x5 Side lunge with wash rag slide out x10 Mini squat x5     PATIENT EDUCATION:  Education details: PT POC and goals, HEP, ionto Person educated: Patient Education method: Explanation, Demonstration, and Handouts Education  comprehension: verbalized understanding and returned demonstration   HOME EXERCISE PROGRAM: Access Code: [email or web address] URL: [email or web address] Date: 08/15/2022 Prepared by: Corlis Leak  Exercises - Active Straight Leg Raise with Quad Set  - 1 x daily - 7 x weekly - 3 sets - 10 reps - Supine Bridge  - 1 x daily - 7 x weekly - 3 sets - 10 reps - Sidelying Hip Abduction  -  1 x daily - 7 x weekly - 3 sets - 10 reps - Clamshell  - 1 x daily - 7 x weekly - 3 sets - 10 reps - Supine Piriformis Stretch with Leg Straight  - 2 x daily - 7 x weekly - 1 sets - 3 reps - 30 sec  hold - Prone Gluteal Sets  - 2 x daily - 7 x weekly - 1 sets - 10 reps - 10 sec  hold - Prone Hip Extension  - 2 x daily - 7 x weekly - 1 sets - 10 reps - 3 sec  hold - Seated Hip Flexor Stretch  - 2 x daily - 7 x weekly - 1 sets - 3 reps - 30 sec  hold - Standing Piriformis Release with Ball at [place]  - 2 x daily - 7 x weekly - 30  [email or web address]       ASSESSMENT:  CLINICAL IMPRESSION:  Continued to work on hip strengthening. Initiated pelvic floor muscle strengthening this session.     GOALS: Goals reviewed with patient? Yes    LONG TERM GOALS:  Pt will be independent with HEP Baseline:  Goal status: INITIAL  2.  Pt will improve FOTO to >= 67 to demo improved functional mobility Baseline:  Goal status: INITIAL  3.  Pt will improve bilat LE strength to 4+/5 to tolerate walking and standing Baseline:  Goal status: INITIAL  4.  Pt will tolerate walking x 20 minutes with pain <= 1/10 Baseline:  Goal status: INITIAL   PLAN: PT FREQUENCY: 2x/week  PT DURATION: 6 weeks  PLANNED INTERVENTIONS: Therapeutic exercises, Therapeutic activity, Neuromuscular re-education, Balance training, Gait training, Patient/Family education, Self Care, Joint mobilization, Aquatic Therapy, Dry Needling, Cryotherapy, Moist heat, Taping, Ionotophoresis 4mg /ml Dexamethasone, Manual therapy, and  Re-evaluation  PLAN FOR NEXT SESSION: assess and progress HEP, Aggressive hip strengthening!   Anelle Parlow April Ma L Milt Coye, PT, DPT 09/11/2022, 1:27 PM

## 2022-09-16 ENCOUNTER — Encounter: Payer: Self-pay | Admitting: Physical Therapy

## 2022-09-16 ENCOUNTER — Ambulatory Visit: Payer: 59 | Admitting: Physical Therapy

## 2022-09-16 ENCOUNTER — Ambulatory Visit: Payer: Medicare Other | Admitting: Sports Medicine

## 2022-09-16 DIAGNOSIS — M6281 Muscle weakness (generalized): Secondary | ICD-10-CM | POA: Diagnosis not present

## 2022-09-16 DIAGNOSIS — R29898 Other symptoms and signs involving the musculoskeletal system: Secondary | ICD-10-CM

## 2022-09-16 DIAGNOSIS — M25551 Pain in right hip: Secondary | ICD-10-CM

## 2022-09-16 NOTE — Therapy (Addendum)
OUTPATIENT PHYSICAL THERAPY AND DISCHARGE   Patient Name: Alice Stewart MRN: [id number] DOB:22-Mar-1956, 66 y.o., female Today's Date: 09/16/2022   PT End of Session - 09/16/22 1316     Visit Number 10    Number of Visits 12    Date for PT Re-Evaluation 09/24/22    Authorization Type [employer] Medicare    Authorization - Visit Number 10    Progress Note Due on Visit 10    PT Start Time 2585    PT Stop Time 1400    PT Time Calculation (min) 43 min    Activity Tolerance Patient tolerated treatment well    Behavior During Therapy WFL for tasks assessed/performed                  Past Medical History:  Diagnosis Date   Anemia    Back pain    Benign positional vertigo    Cardiomyopathy ([place])    Carpal tunnel syndrome    Goiter    nontoxid mutinodular   Hypertension    Hyperthyroidism    Insulin resistance syndrome    Menopausal disorder    Menorrhagia    Migraine    Muscle spasm    trapezius   Neck pain, acute    Obesity    Seborrheic dermatitis    Thyroid disorder    Past Surgical History:  Procedure Laterality Date   DILATION AND CURETTAGE OF UTERUS  04/09   ENDOMETRIAL ABLATION  04/09   GREAT TOE ARTHRODESIS, METATARSALPHALANGEAL JOINT  6/07   Rt foot   HEEL SPUR SURGERY  11/87   Lt foot   ICD GENERATOR CHANGEOUT N/A 01/18/2022   Procedure: ICD GENERATOR CHANGEOUT;  Surgeon: Constance Haw, MD;  Location: [facility];  Service: Cardiovascular;  Laterality: N/A;   TUBAL LIGATION  11/90   Patient Active Problem List   Diagnosis Date Noted   Mixed incontinence 09/10/2022   Chronic right hip pain 08/05/2022   Recurrent BV (bacterial vaginosis) 07/02/2022   Patellofemoral pain syndrome of both knees 05/01/2022   Yeast detected 04/11/2022   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 27/78/2423   Chronic systolic heart failure ([place]) 01/08/2022   Abdominal pain, acute, generalized 08/08/2021   Right ureteral stone 08/08/2021   Left foot pain  07/29/2021   Cervical radiculopathy 07/29/2021   Vaginal discharge 06/29/2021   Encounter for removal of ureteral stent 06/13/2021   Depression 06/30/2018   Presence of cardiac pacemaker 03/22/2015   Fatty liver 03/22/2015   Fibromyalgia 09/30/2012   Hyperthyroidism 02/18/2011   Anemia 02/25/2008   MENORRHAGIA 02/25/2008   MENOPAUSAL DISORDER 08/10/2007   OBESITY NOS 03/05/2007   Back pain 03/05/2007   MUSCLE SPASM, TRAPEZIUS 01/26/2007   MIGRAINE, COMMON W/O INTRACTABLE MIGRAINE 10/29/2006   GOITER, NONTOXIC MULTINODULAR 10/06/2006   HYPERCHOLESTEROLEMIA 09/30/2006   Hypertension 09/30/2006    REFERRING PROVIDER: Dianah Field  REFERRING DIAG: Chronic Rt hip pain  THERAPY DIAG:  Muscle weakness (generalized)  Pain in right hip  Other symptoms and signs involving the musculoskeletal system  Rationale for Evaluation and Treatment Rehabilitation  ONSET DATE: 07/2022  SUBJECTIVE:   SUBJECTIVE STATEMENT: Pt reports that she is feeling the pain from her neck down. Pt reports it started last night. She does feel that the pelvic floor muscle exercises have been helping her.   PERTINENT HISTORY: Pt has neck and back pain, knee pain. Lt heel spurs  PAIN:  Are you having pain? Yes: NPRS scale: 7 /10  Pain location: low back/hips Pain description: "I can bare it" Aggravating factors: standing, walking Relieving factors: icy hot  PRECAUTIONS: pacemaker - no estim  PATIENT GOALS reduce pain   OBJECTIVE:   PALPATION: TTP Rt greater trochanter, Rt iliac crest, glutes  LOWER EXTREMITY ROM:  Active ROM Right eval Left eval  Hip flexion    Hip extension    Hip abduction    Hip adduction    Hip internal rotation Limited 25% Limited 25%  Hip external rotation Limited 25% WFL  Knee flexion    Knee extension    Ankle dorsiflexion    Ankle plantarflexion    Ankle inversion    Ankle eversion     (Blank rows = not tested)  LOWER EXTREMITY MMT:  MMT Right eval  Left eval Right 9/1 Left 9/1  Hip flexion 3/5 3/5 3+ 3+  Hip extension 3/5 3/5 3 3+  Hip abduction 3+/5 3+/5 4- 4-  Hip adduction      Hip internal rotation      Hip external rotation      Knee flexion      Knee extension      Ankle dorsiflexion      Ankle plantarflexion      Ankle inversion      Ankle eversion       (Blank rows = not tested)   FUNCTIONAL TESTS:  5 times sit to stand: 14.28 seconds  (eval)   TODAY'S TREATMENT: 09/16/22 Sidelying Open/close book 3x20 sec R&L  Supine Happy baby stretch 2x30 sec Trunk rotation x30 sec Diaphgragmatic breathing with pelvic floor contraction 2x10  Manual therapy STM & TPR thoracic paraspinals, Uts, bilat glutes PA mobs sacrum and thoracic spine   09/11/22 Seated Figure 4 stretch x30 sec bilat Piriformis stretch x30 sec bilat Hamstring stretch x30 sec bilat Hip adduction with PFM contraction & TSA contraction 10x3 sec  Supine:  Adductor stretch with strap x30 sec bilat stretch Happy baby stretch x30 sec  Prone: Hip extension x15 Knee fall out 2x10  Sidelying: Clamshell 2x10 Hip abduction 2x5   09/09/22 Seated Figure 4 stretch x30 sec HS stretch 2x30 sec  Supine Clamshell green TB 2x10 Marching with PPT green TB 2x10 Beginner bridging x10 Low trunk 2x30 sec  Prone Hip extension x10 Knee fall out 2x10   09/06/22 Nustep L5 x 5 min warm up  Standing: Hip abduction red TB 3 x 5 Hip extension red TB 3 x 5 Marching 3 x 5   Seated: Piriformis stretch 2 x 30 sec HS stretch 2 x 30 Hip IR with ball squeeze 3 x 5  Prone: Hip ext 2 x 5 bilat, then hip ext with knee flexed x 5 bilat  Manual: STM as tolerated to Rt ITB and glutes  Moist Heat x 10 minutes     PATIENT EDUCATION:  Education details: PT POC and goals, HEP, ionto Person educated: Patient Education method: Explanation, Demonstration, and Handouts Education comprehension: verbalized understanding and returned demonstration   HOME  EXERCISE PROGRAM: Access Code: [email or web address] URL: [email or web address] Date: 08/15/2022 Prepared by: Gillermo Murdoch  Exercises - Active Straight Leg Raise with Quad Set  - 1 x daily - 7 x weekly - 3 sets - 10 reps - Supine Bridge  - 1 x daily - 7 x weekly - 3 sets - 10 reps - Sidelying Hip Abduction  - 1 x daily - 7 x weekly - 3 sets - 10 reps - Clamshell  - 1 x daily -  7 x weekly - 3 sets - 10 reps - Supine Piriformis Stretch with Leg Straight  - 2 x daily - 7 x weekly - 1 sets - 3 reps - 30 sec  hold - Prone Gluteal Sets  - 2 x daily - 7 x weekly - 1 sets - 10 reps - 10 sec  hold - Prone Hip Extension  - 2 x daily - 7 x weekly - 1 sets - 10 reps - 3 sec  hold - Seated Hip Flexor Stretch  - 2 x daily - 7 x weekly - 1 sets - 3 reps - 30 sec  hold - Standing Piriformis Release with Ball at [place]  - 2 x daily - 7 x weekly - 30  [email or web address]       ASSESSMENT:  CLINICAL IMPRESSION:  Decrease in pain to 3/10 by end of session. Worked on spine/pelvic mobility and strengthening. Initiated diaphragmatic breathing with pelvic floor strengthening.     GOALS: Goals reviewed with patient? Yes    LONG TERM GOALS:  Pt will be independent with HEP Baseline:  Goal status: INITIAL  2.  Pt will improve FOTO to >= 67 to demo improved functional mobility Baseline:  Goal status: INITIAL  3.  Pt will improve bilat LE strength to 4+/5 to tolerate walking and standing Baseline:  Goal status: INITIAL  4.  Pt will tolerate walking x 20 minutes with pain <= 1/10 Baseline:  Goal status: INITIAL   PLAN: PT FREQUENCY: 2x/week  PT DURATION: 6 weeks  PLANNED INTERVENTIONS: Therapeutic exercises, Therapeutic activity, Neuromuscular re-education, Balance training, Gait training, Patient/Family education, Self Care, Joint mobilization, Aquatic Therapy, Dry Needling, Cryotherapy, Moist heat, Taping, Ionotophoresis 4mg /ml Dexamethasone, Manual therapy, and  Re-evaluation  PLAN FOR NEXT SESSION: assess and progress HEP, Aggressive hip strengthening! PHYSICAL THERAPY DISCHARGE SUMMARY  Visits from Start of Care: 10  Current functional level related to goals / functional outcomes: Decreased pain   Remaining deficits: See above   Education / Equipment: HEP   Patient agrees to discharge. Patient goals were partially met. Patient is being discharged due to not returning since the last visit.  Isabelle Course, PT,DPT10/04/231:29 PM    Estill Bamberg April Gordy Levan, PT, DPT 09/16/2022, 1:17 PM

## 2022-09-20 ENCOUNTER — Ambulatory Visit: Payer: 59 | Admitting: Physical Therapy

## 2022-09-23 ENCOUNTER — Encounter: Payer: Self-pay | Admitting: Obstetrics & Gynecology

## 2022-09-23 ENCOUNTER — Ambulatory Visit (INDEPENDENT_AMBULATORY_CARE_PROVIDER_SITE_OTHER): Payer: 59 | Admitting: Obstetrics & Gynecology

## 2022-09-23 ENCOUNTER — Other Ambulatory Visit (HOSPITAL_COMMUNITY)
Admission: RE | Admit: 2022-09-23 | Discharge: 2022-09-23 | Disposition: A | Discharge status: Home / Self Care | Payer: 59 | Source: Ambulatory Visit | Attending: Obstetrics & Gynecology | Admitting: Obstetrics & Gynecology

## 2022-09-23 VITALS — BP 159/80 | HR 60 | Ht 64.0 in | Wt 246.0 lb

## 2022-09-23 DIAGNOSIS — N760 Acute vaginitis: Secondary | ICD-10-CM | POA: Insufficient documentation

## 2022-09-23 DIAGNOSIS — N950 Postmenopausal bleeding: Secondary | ICD-10-CM

## 2022-09-23 DIAGNOSIS — B9689 Other specified bacterial agents as the cause of diseases classified elsewhere: Secondary | ICD-10-CM | POA: Diagnosis present

## 2022-09-23 MED ORDER — MISOPROSTOL 200 MCG PO TABS: ORAL_TABLET | ORAL | 0 refills | Status: DC

## 2022-09-23 NOTE — Progress Notes (Unsigned)
Pt started bleeding last Wednesday- Saturday

## 2022-09-23 NOTE — Progress Notes (Unsigned)
   Subjective:    Patient ID: Alice Stewart, female    DOB: [birth year], 66 y.o.   MRN: [id number]  HPI  Alice Stewart is a 66 year old female who presents for postmenopausal bleeding.  She has stopped her maintenance BV medications because she was having vaginal bleeding.  She has no pain.  We discussed biopsy and ultrasound and biopsy was attempted today.  Aptima was also sent to see if patient has bacterial vaginosis recurred  Review of Systems  Constitutional: Negative.   Respiratory: Negative.    Cardiovascular: Negative.   Gastrointestinal: Negative.   Genitourinary:  Positive for vaginal bleeding.       Objective:   Physical Exam Vitals reviewed.  Constitutional:      General: She is not in acute distress.    Appearance: She is well-developed.  HENT:     Head: Normocephalic and atraumatic.  Eyes:     Conjunctiva/sclera: Conjunctivae normal.  Cardiovascular:     Rate and Rhythm: Normal rate.  Pulmonary:     Effort: Pulmonary effort is normal.  Genitourinary:    Comments: Tanner V Vulva:  No lesion Vagina:  Pink, no lesions, no discharge, +blood at apex Cervix:  No lesion  Skin:    General: Skin is warm and dry.  Neurological:     Mental Status: She is alert and oriented to person, place, and time.  Psychiatric:        Mood and Affect: Mood normal.    Vitals:   09/23/22 1448  BP: (!) 159/80  Pulse: 60  Weight: 246 lb (111.6 kg)  Height: 5\' 4"  (1.626 m)     Assessment & Plan:  66 yo female with PMB.   EMB attempted and good not get through the internal os.  Will get pelvic US with TVUS.   RTC 23 weeks after [place] and attempt biospy with cytotec.  Leakage of urine--has referral to Urogyn Aptima for vaginal d/c Patient is followed by cardiology for her uncontrolled blood pressure.

## 2022-09-24 ENCOUNTER — Encounter: Payer: 59 | Admitting: Physical Therapy

## 2022-09-24 LAB — CERVICOVAGINAL ANCILLARY ONLY
Bacterial Vaginitis (gardnerella): POSITIVE — AB
Comment: NEGATIVE

## 2022-09-25 ENCOUNTER — Other Ambulatory Visit: Payer: Self-pay | Admitting: *Deleted

## 2022-09-25 ENCOUNTER — Ambulatory Visit (INDEPENDENT_AMBULATORY_CARE_PROVIDER_SITE_OTHER): Payer: 59

## 2022-09-25 DIAGNOSIS — N950 Postmenopausal bleeding: Secondary | ICD-10-CM | POA: Diagnosis not present

## 2022-09-25 MED ORDER — METRONIDAZOLE 500 MG PO TABS: 500.0000 mg | ORAL_TABLET | Freq: Two times a day (BID) | ORAL | 0 refills | Status: DC

## 2022-09-25 MED ORDER — MISOPROSTOL 200 MCG PO TABS: 200.0000 ug | ORAL_TABLET | Freq: Four times a day (QID) | ORAL | 0 refills | Status: DC

## 2022-09-27 ENCOUNTER — Encounter: Payer: 59 | Admitting: Physical Therapy

## 2022-10-09 ENCOUNTER — Encounter: Payer: Self-pay | Admitting: *Deleted

## 2022-10-14 ENCOUNTER — Encounter: Payer: Self-pay | Admitting: Obstetrics & Gynecology

## 2022-10-14 ENCOUNTER — Ambulatory Visit (INDEPENDENT_AMBULATORY_CARE_PROVIDER_SITE_OTHER): Payer: 59 | Admitting: Obstetrics & Gynecology

## 2022-10-14 ENCOUNTER — Other Ambulatory Visit (HOSPITAL_COMMUNITY)
Admission: RE | Admit: 2022-10-14 | Discharge: 2022-10-14 | Disposition: A | Discharge status: Home / Self Care | Payer: 59 | Source: Ambulatory Visit | Attending: Obstetrics & Gynecology | Admitting: Obstetrics & Gynecology

## 2022-10-14 VITALS — BP 154/79 | HR 60 | Ht 64.0 in | Wt 248.0 lb

## 2022-10-14 DIAGNOSIS — N898 Other specified noninflammatory disorders of vagina: Secondary | ICD-10-CM | POA: Diagnosis present

## 2022-10-14 DIAGNOSIS — R9389 Abnormal findings on diagnostic imaging of other specified body structures: Secondary | ICD-10-CM

## 2022-10-14 NOTE — Progress Notes (Signed)
Subjective:    Patient ID: Alice Stewart, female    DOB: [birth year], 65 y.o.   MRN: [id number]  HPI 66 yo female presents for 2nd attempt at endometrial biopsy.  This time with cytotec.  She has not bled for 1 month.  Pt also complaining of discharge and burning  US PELVIC COMPLETE WITH TRANSVAGINAL  Result Date: 09/25/2022 CLINICAL DATA:  Postmenopausal bleeding 1 week ago EXAM: TRANSABDOMINAL AND TRANSVAGINAL ULTRASOUND OF PELVIS TECHNIQUE: Both transabdominal and transvaginal ultrasound examinations of the pelvis were performed. Transabdominal technique was performed for global imaging of the pelvis including uterus, ovaries, adnexal regions, and pelvic cul-de-sac. It was necessary to proceed with endovaginal exam following the transabdominal exam to visualize the endometrium and ovaries. COMPARISON:  12/26/2010 FINDINGS: Uterus Measurements: 9.1 x 4.6 x 6.6 cm = volume: 146 mL. Anteverted. Normal morphology without mass Endometrium Thickness: 7 mm. No endometrial mass. Small amount of nonspecific endometrial fluid present. Right ovary Not visualized, likely obscured by bowel Left ovary Not visualized, likely obscured by bowel Other findings No rib pelvic fluid or adnexal masses. IMPRESSION: Nonvisualization of ovaries. Thickened endometrial complex 7 mm thick; in the setting of post-menopausal bleeding, endometrial sampling is indicated to exclude carcinoma. If results are benign, sonohysterogram should be considered for focal lesion work-up. (Ref: Radiological Reasoning: Algorithmic Workup of Abnormal Vaginal Bleeding with Endovaginal Sonography and Sonohysterography. AJR 2008; 858:I50-27) These results will be called to the ordering clinician or representative by the Radiologist Assistant, and communication documented in the PACS or [employer]. Electronically Signed   By: Lavonia Dana M.D.   On: 09/25/2022 15:12     Review of Systems  Constitutional: Negative.   Respiratory: Negative.     Cardiovascular: Negative.   Gastrointestinal: Negative.   Genitourinary: Negative.        Objective:   Physical Exam Vitals reviewed. Exam conducted with a chaperone present (labia majora slightly red (wear pads due to incontinence)).  Constitutional:      General: She is not in acute distress.    Appearance: She is well-developed.  HENT:     Head: Normocephalic and atraumatic.  Eyes:     Conjunctiva/sclera: Conjunctivae normal.  Cardiovascular:     Rate and Rhythm: Normal rate.  Pulmonary:     Effort: Pulmonary effort is normal.  Genitourinary:    Exam position: Lithotomy position.     Pubic Area: No rash.      Labia:        Right: Rash present.        Left: Rash present.      Comments: Cervix--no lesion Skin:    General: Skin is warm and dry.  Neurological:     Mental Status: She is alert and oriented to person, place, and time.  Psychiatric:        Mood and Affect: Mood normal.       Assessment & Plan:  66 yo female with 2 concerns   PMB needing endometrial biopsy Vaginal irritaiton--aptima  ENDOMETRIAL BIOPSY     The indications for endometrial biopsy were reviewed.   Risks of the biopsy including cramping, bleeding, infection, uterine perforation, inadequate specimen and need for additional procedures  were discussed. The patient states she understands and agrees to undergo procedure today. Consent was signed. Time out was performed. Urine HCG was negative. A sterile speculum was placed in the patient's vagina and the cervix was prepped with Betadine. A single-toothed tenaculum was placed on the anterior lip of the cervix  to stabilize it. The disposable cervical dilators were used to open the external os. The 3 mm pipelle was introduced into the endometrial cavity without difficulty to a depth of 8 cm, and a moderate amount of tissue was obtained and sent to pathology. The instruments were removed from the patient's vagina. Minimal bleeding from the cervix was  noted. The patient tolerated the procedure well. Routine post-procedure instructions were given to the patient. The patient will follow up to review the results and for further management.

## 2022-10-15 ENCOUNTER — Ambulatory Visit (INDEPENDENT_AMBULATORY_CARE_PROVIDER_SITE_OTHER): Payer: 59 | Admitting: Sports Medicine

## 2022-10-15 DIAGNOSIS — G8929 Other chronic pain: Secondary | ICD-10-CM | POA: Diagnosis not present

## 2022-10-15 DIAGNOSIS — M25551 Pain in right hip: Secondary | ICD-10-CM | POA: Diagnosis not present

## 2022-10-15 LAB — CERVICOVAGINAL ANCILLARY ONLY
Bacterial Vaginitis (gardnerella): POSITIVE — AB
Candida Glabrata: NEGATIVE
Candida Vaginitis: NEGATIVE
Comment: NEGATIVE
Comment: NEGATIVE
Comment: NEGATIVE

## 2022-10-15 NOTE — Progress Notes (Signed)
    Procedures performed today:    None.  Independent interpretation of notes and tests performed by another provider:   None.  Brief History, Exam, Impression, and Recommendations:    Chronic right hip pain Alice Stewart returns, right lateral hip pain, suspected trochanteric bursitis at the last visit, she did start some physical therapy but feels as though is worsening her hip pain, she requested an injection show we did 1 at the last visit, she returns today feeling a lot better, her hip abductors are still very weak, she can barely abduct against gravity. I cut her some red and green Thera-Band, and she will start with hip abductor exercises without resistance. I strongly stressed the importance of getting her hip abductor strength back to prevent recurrence of trochanteric bursitis.    ____________________________________________ Gwen Her. Dianah Field, M.D., ABFM., CAQSM., AME. Primary Care and Sports Medicine [facility]  Adjunct Professor of [place] of [facility] [employer]  Risk manager

## 2022-10-15 NOTE — Assessment & Plan Note (Signed)
Alice Stewart returns, right lateral hip pain, suspected trochanteric bursitis at the last visit, she did start some physical therapy but feels as though is worsening her hip pain, she requested an injection show we did 1 at the last visit, she returns today feeling a lot better, her hip abductors are still very weak, she can barely abduct against gravity. I cut her some red and green Thera-Band, and she will start with hip abductor exercises without resistance. I strongly stressed the importance of getting her hip abductor strength back to prevent recurrence of trochanteric bursitis.

## 2022-10-16 LAB — SURGICAL PATHOLOGY

## 2022-10-18 ENCOUNTER — Ambulatory Visit (INDEPENDENT_AMBULATORY_CARE_PROVIDER_SITE_OTHER): Payer: 59

## 2022-10-18 DIAGNOSIS — I428 Other cardiomyopathies: Secondary | ICD-10-CM

## 2022-10-18 LAB — CUP PACEART REMOTE DEVICE CHECK
Battery Remaining Longevity: 80 mo
Battery Remaining Percentage: 89 %
Battery Voltage: 2.99 V
Brady Statistic AP VP Percent: 61 %
Brady Statistic AP VS Percent: 1 %
Brady Statistic AS VP Percent: 39 %
Brady Statistic AS VS Percent: 1 %
Brady Statistic RA Percent Paced: 60 %
Date Time Interrogation Session: 20231027020031
HighPow Impedance: 68 Ohm
Implantable Lead Connection Status: 753985
Implantable Lead Connection Status: 753985
Implantable Lead Connection Status: 753985
Implantable Lead Implant Date: 20150303
Implantable Lead Implant Date: 20150303
Implantable Lead Implant Date: 20150303
Implantable Lead Location: 753858
Implantable Lead Location: 753859
Implantable Lead Location: 753860
Implantable Pulse Generator Implant Date: 20230127
Lead Channel Impedance Value: 400 Ohm
Lead Channel Impedance Value: 430 Ohm
Lead Channel Impedance Value: 560 Ohm
Lead Channel Pacing Threshold Amplitude: 0.875 V
Lead Channel Pacing Threshold Amplitude: 0.875 V
Lead Channel Pacing Threshold Amplitude: 1.625 V
Lead Channel Pacing Threshold Pulse Width: 0.5 ms
Lead Channel Pacing Threshold Pulse Width: 0.5 ms
Lead Channel Pacing Threshold Pulse Width: 0.7 ms
Lead Channel Sensing Intrinsic Amplitude: 12 mV
Lead Channel Sensing Intrinsic Amplitude: 2 mV
Lead Channel Setting Pacing Amplitude: 1.875
Lead Channel Setting Pacing Amplitude: 1.875
Lead Channel Setting Pacing Amplitude: 2.125
Lead Channel Setting Pacing Pulse Width: 0.5 ms
Lead Channel Setting Pacing Pulse Width: 0.7 ms
Lead Channel Setting Sensing Sensitivity: 0.5 mV
Pulse Gen Serial Number: 111057141
Zone Setting Status: 755011

## 2022-10-20 ENCOUNTER — Other Ambulatory Visit: Payer: Self-pay | Admitting: Obstetrics & Gynecology

## 2022-10-20 DIAGNOSIS — B9689 Other specified bacterial agents as the cause of diseases classified elsewhere: Secondary | ICD-10-CM

## 2022-10-20 MED ORDER — CLINDAMYCIN PHOSPHATE 2 % VA CREA: TOPICAL_CREAM | VAGINAL | 3 refills | Status: DC

## 2022-10-22 ENCOUNTER — Other Ambulatory Visit: Payer: Self-pay | Admitting: *Deleted

## 2022-10-22 MED ORDER — FLECAINIDE ACETATE 100 MG PO TABS: 100.0000 mg | ORAL_TABLET | Freq: Two times a day (BID) | ORAL | 3 refills | Status: DC

## 2022-10-23 NOTE — Progress Notes (Signed)
Remote ICD transmission.   

## 2022-10-31 NOTE — Therapy (Signed)
OUTPATIENT PHYSICAL THERAPY FEMALE PELVIC EVALUATION   Patient Name: Alice Stewart MRN: [id number] DOB:[birth year], 66 y.o., female Today's Date: 11/04/2022   PT End of Session - 11/04/22 1448     Visit Number 1    Date for PT Re-Evaluation 12/30/22    Authorization Type [employer] Medicare    PT Start Time 1445    PT Stop Time 1525    PT Time Calculation (min) 40 min    Activity Tolerance Patient tolerated treatment well    Behavior During Therapy WFL for tasks assessed/performed             Past Medical History:  Diagnosis Date   Anemia    Back pain    Benign positional vertigo    Cardiomyopathy (HCC)    Carpal tunnel syndrome    Goiter    nontoxid mutinodular   Hypertension    Hyperthyroidism    Insulin resistance syndrome    Menopausal disorder    Menorrhagia    Migraine    Muscle spasm    trapezius   Neck pain, acute    Obesity    Seborrheic dermatitis    Thyroid disorder    Past Surgical History:  Procedure Laterality Date   DILATION AND CURETTAGE OF UTERUS  04/09   ENDOMETRIAL ABLATION  04/09   GREAT TOE ARTHRODESIS, METATARSALPHALANGEAL JOINT  6/07   Rt foot   HEEL SPUR SURGERY  11/87   Lt foot   ICD GENERATOR CHANGEOUT N/A 01/18/2022   Procedure: ICD GENERATOR CHANGEOUT;  Surgeon: Regan Lemming, MD;  [facility]: 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 04/08/2022   Chronic systolic heart failure (HCC) 01/08/2022   Abdominal pain, acute, generalized 08/08/2021   Right ureteral stone 08/08/2021   Left foot pain 07/29/2021   Cervical radiculopathy 07/29/2021   Encounter for removal of ureteral stent  06/13/2021   Depression 06/30/2018   Presence of cardiac pacemaker 03/22/2015   Fatty liver 03/22/2015   Fibromyalgia 09/30/2012   Hyperthyroidism 02/18/2011   Anemia 02/25/2008   OBESITY NOS 03/05/2007   Back pain 03/05/2007   MUSCLE SPASM, TRAPEZIUS 01/26/2007   MIGRAINE, COMMON W/O INTRACTABLE MIGRAINE 10/29/2006   GOITER, NONTOXIC MULTINODULAR 10/06/2006   HYPERCHOLESTEROLEMIA 09/30/2006   Hypertension 09/30/2006    PCP: Christen Butter, NP  REFERRING PROVIDER: Charlton Amor, DO   REFERRING DIAG: N39.46 (ICD-10-CM) - Mixed incontinence  THERAPY DIAG:  Abnormal posture  Muscle weakness (generalized)  Unspecified lack of coordination  Rationale for Evaluation and Treatment: Rehabilitation  ONSET DATE: 06/2022  SUBJECTIVE:  11/04/22 SUBJECTIVE STATEMENT: Pt had to go to the hospital this last July due to blood pressure complications; she was placed on new medication and she had side effects including bladder incontinence. She never had issues with incontinence prior to this. She has to run to the bathroom in the morning in addition to stress urinary incontinence. She does have low back pain. She is currently struggling with bacterial vaginosis. She would like to have surgery for abdominal fat if that is what it will take to get her better.  Fluid intake: Yes: 4-5 glasses a day; coffee    PAIN:  Are you having pain? Yes NPRS scale: 8/10 Pain location:  low back  Pain type: aching and throbbing Pain description: intermittent   Aggravating factors: prolonged standing Relieving factors: sitting, heating pad  PRECAUTIONS: None  WEIGHT BEARING RESTRICTIONS: No  FALLS:  Has patient fallen in last 6 months? No  LIVING ENVIRONMENT: Lives with: lives alone Lives in:  House/apartment   OCCUPATION: not working  PLOF: Independent  PATIENT GOALS: stop incontinence  PERTINENT HISTORY:  D&C, endometrial ablation, G3P3 Sexual abuse: No  BOWEL MOVEMENT: Pain with bowel movement: No Type of bowel movement:Frequency every 2 days  and Strain No Fully empty rectum: Yes: - Leakage: No Pads: No Fiber supplement: No  URINATION: Pain with urination: No Fully empty bladder: Yes: - Stream: Strong Urgency: Yes: first thing in the morning is the worst Frequency: every 2 hours Leakage: Urge to void, Walking to the bathroom, Coughing, Sneezing, and Laughing Pads: Yes: wears pad to bed and throughout the day  - changing at least twice a day  INTERCOURSE: Pain with intercourse:  not currently Ability to have vaginal penetration:  Yes: - Climax: - Marinoff Scale: 0/3  PREGNANCY: Vaginal deliveries 3 Tearing No C-section deliveries 0 Currently pregnant No  PROLAPSE: Weight of basketball sitting in pelvis  OBJECTIVE:  11/04/22  COGNITION: Overall cognitive status: Within functional limits for tasks assessed     SENSATION: Light touch: Appears intact Proprioception: Appears intact  MUSCLE LENGTH:   FUNCTIONAL TESTS:  Breath holding with bed mobility  GAIT:  Comments: decreased bil hip extension  POSTURE: rounded shoulders, forward head, increased lumbar lordosis, and anterior pelvic tilt    PALPATION:   General  mild left lower quadrant tenderness                External Perineal Exam redness                             Internal Pelvic Floor WNL  Patient confirms identification and approves PT to assess internal pelvic floor and treatment Yes  PELVIC MMT:   MMT eval  Vaginal 1/5, poor coordination; endurance 3 seconds; repeat contractions 5  Internal Anal Sphincter   External Anal Sphincter   Puborectalis   Diastasis Recti 3 finger width separation with notable distortion; unable to sit-up from supine  (Blank rows = not  tested)        TONE: low  PROLAPSE: Grade 2 anterior vaginal wall laxity  TODAY'S TREATMENT:  DATE: 11/04/22  EVAL  Neuromuscular re-education: Pelvic floor contraction training: Contraction training Quick flicks 10x Long holds 5 x 10 seconds Self-care: Urge suppression technique The knack Abdominal pressure management     PATIENT EDUCATION:  Education details: see above self-care Person educated: Patient Education method: Explanation, Demonstration, Tactile cues, Verbal cues, and Handouts Education comprehension: verbalized understanding  HOME EXERCISE PROGRAM: [id number]  ASSESSMENT:  CLINICAL IMPRESSION: Patient is a 66 y.o. female who was seen today for physical therapy evaluation and treatment for urge and stress urinary incontinence and secondary complaint of constipation and low back pain. Exam findings notable for DRA 3 finger width separation with distortion, core weakness, pelvic floor weakness 1/5, decreased pelvic floor endurance 3 seconds, poor pelvic floor contraction motor control, and grade 2 anterior vaginal wall laxity. Signs and symptoms are most consistent with anterior vaginal wall laxity, poor pressure management, and pelvic floor weakness. Initial treatment consisted of pelvic floor contraction training and pelvic floor strengthening program, education on pressure management, and instructions on urge suppression technique and the knack. Believe she will make good progress with conservative care due to immediate progress with pelvic floor contraction training. She will benefit from skilled PT intervention in order to decrease urge and stress urinary incontinence, improve low back pain/constipation, improve abdominal pressure management to prevent worsening of anterior vaginal wall laxity, and improve QOL.   OBJECTIVE IMPAIRMENTS:  decreased activity tolerance, decreased coordination, decreased endurance, decreased strength, increased fascial restrictions, increased muscle spasms, impaired tone, postural dysfunction, and pain.   ACTIVITY LIMITATIONS: continence  PARTICIPATION LIMITATIONS: community activity  PERSONAL FACTORS: 1-2 comorbidities: D&C, endometrial ablation, G3P3  are also affecting patient's functional outcome.   REHAB POTENTIAL: Good  CLINICAL DECISION MAKING: Stable/uncomplicated  EVALUATION COMPLEXITY: Low   GOALS: Goals reviewed with patient? Yes  SHORT TERM GOALS: Target date: 12/02/2022  Pt will be independent with HEP.   Baseline: Goal status: INITIAL  2.  Pt will be independent with the knack, urge suppression technique, and double voiding in order to improve bladder habits and decrease urinary incontinence.   Baseline:  Goal status:   3.  Pt will be independent with use of squatty potty, relaxed toileting mechanics, and improved bowel movement techniques in order to increase ease of bowel movements and complete evacuation.   Baseline:  Goal status:   4.  Pt will be able to correctly perform diaphragmatic breathing and appropriate pressure management in order to prevent worsening vaginal wall laxity and improve pelvic floor A/ROM.   Baseline:  Goal status: INITIAL   LONG TERM GOALS: Target date: 12/30/2022   Pt will be independent with advanced HEP.   Baseline:  Goal status: INITIAL  2.  Pt will demonstrate normal pelvic floor muscle tone and A/ROM, able to achieve 3/5 strength with contractions and 10 sec endurance, in order to provide appropriate lumbopelvic support in functional activities.   Baseline:  Goal status: INITIAL  3.  Pt will not have severe urinary urgency and leaking in the morning when waking and will be able to make it to the bathroom. Baseline:  Goal status: INITIAL  4.  Pt will report no greater than 2/10 low back pain with standing >1 hour in  order to be able to cook without difficulty. Baseline:  Goal status: INITIAL  5.  Pt will report ability to have >5 bowel movements a week without difficulty or straining in order to put less pressure on bladder and reduce urinary incontinence.  Baseline:  Goal status: INITIAL  PLAN:  PT FREQUENCY: 1x/week  PT DURATION: 8 weeks  PLANNED INTERVENTIONS: Therapeutic exercises, Therapeutic activity, Neuromuscular re-education, Balance training, Gait training, Patient/Family education, Self Care, Joint mobilization, Dry Needling, Biofeedback, and Manual therapy  PLAN FOR NEXT SESSION: Plan to progress pelvic floor contraction training in standing positions; start core training/progressions; better bowel habits.    Heather Roberts, PT, DPT11/13/234:32 PM

## 2022-11-04 ENCOUNTER — Other Ambulatory Visit: Payer: Self-pay

## 2022-11-04 ENCOUNTER — Ambulatory Visit: Payer: 59 | Attending: Family Medicine

## 2022-11-04 DIAGNOSIS — N3946 Mixed incontinence: Secondary | ICD-10-CM | POA: Diagnosis not present

## 2022-11-04 DIAGNOSIS — R293 Abnormal posture: Secondary | ICD-10-CM | POA: Insufficient documentation

## 2022-11-04 DIAGNOSIS — R279 Unspecified lack of coordination: Secondary | ICD-10-CM | POA: Diagnosis not present

## 2022-11-04 DIAGNOSIS — M6281 Muscle weakness (generalized): Secondary | ICD-10-CM | POA: Diagnosis not present

## 2022-11-04 NOTE — Patient Instructions (Signed)
The knack: Use this technique while coughing, laughing, sneezing, or with any activities that causes you to leak urine a little. Right before you perform one of these activities that increase pressure in the abdomen and pushes a little urine out, perform a pelvic floor muscle contraction and hold. If that does not completely stop the leaking, try tightening your thighs together in addition to performing a pelvic floor muscle contraction. Make sure you are not trying to stifle a cough, sneeze, or laugh; allow these activities in full as it will cause less pressure down into the bladder and pelvic floor muscles.  Urge suppression technique: A technique to help you hold urine until it's an appropriate time to go, whether this is making it home or trying to reach a specific voiding time frame according to your schedule. It helps to send signals from the bladder to the brain that say you don't actually have to void urine right now. This most likely only give you several minutes of relief at first, but repeat as needed; the benefit will last longer as you use this technique more and get into better bladder habits. ?  The technique: o Perform 5 quick flicks (Kegels) rapidly, not worrying about fully relaxing in between each (only in this technique). o Then perform several deep belly breaths while focusing on relaxing the pelvic floor. o Go do something else to help distract yourself from the urge to urinate. o Repeat as needed.   [facility] [address], [address] [place], [place] [address] Phone # [phone] Fax [phone]  

## 2022-11-10 ENCOUNTER — Other Ambulatory Visit: Payer: Self-pay | Admitting: Physician Assistant

## 2022-11-13 ENCOUNTER — Telehealth: Payer: Self-pay | Admitting: Cardiology

## 2022-11-13 ENCOUNTER — Encounter: Payer: Self-pay | Admitting: *Deleted

## 2022-11-13 NOTE — Telephone Encounter (Signed)
Patient stated she cannot tolerate labetalol any more. She takes the medication with food, but still has nausea, dizziness, and drowsiness. Last BP 150/70. Her last dose was last night. She stated she is not going to take it any more. She takes the rest of her medications as prescribed. Please advise on medications.

## 2022-11-13 NOTE — Telephone Encounter (Signed)
Pt c/o medication issue:  1. Name of Medication:  labetalol (NORMODYNE) 300 MG tablet  2. How are you currently taking this medication (dosage and times per day)?  As prescribed   3. Are you having a reaction (difficulty breathing--STAT)?   4. What is your medication issue?   Patient states this medication has been causing nausea, dizziness, blurred vision, lack of sleep, bladder issues, and more. She states she is unable to continue tolerating it and she has not taken it this morning.

## 2022-11-13 NOTE — Telephone Encounter (Addendum)
Unable to reach pt or leave a message  My chart message sent to patient

## 2022-11-18 ENCOUNTER — Telehealth: Payer: Self-pay | Admitting: Cardiology

## 2022-11-18 ENCOUNTER — Ambulatory Visit: Payer: Medicare Other | Admitting: Cardiology

## 2022-11-18 NOTE — Progress Notes (Deleted)
HPI: FU CM.  Previously followed by Alice Cork, MD in [place]. She has a history of echocardiogram in 2014 showed ejection fraction 25 to 30% with left bundle branch block.  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  She had CRT-D in 2015.  CTA in July 2021 showed minimal nonobstructive coronary disease with calcium score 34.  Echocardiogram repeated May 2021 showed ejection fraction 55 to 60%.  Her PVCs are now treated with flecainide. Patient previously moved back to this area from [place] state.  Follow-up echocardiogram here September 2022 showed ejection fraction 45 to 50%, moderate left ventricular hypertrophy, grade 1 diastolic dysfunction, mild mitral regurgitation. Patient had ICD generator change January 2023.  Note by report patient did not tolerate carvedilol, Toprol, Entresto, spironolactone, Cardura and recently discontinued labetalol.  She is intolerant to statins.  Since last seen  Current Outpatient Medications  Medication Sig Dispense Refill   Ascorbic Acid (VITAMIN C) 1000 MG tablet Take 1,000 mg by mouth daily.       aspirin 81 MG EC tablet Take 81 mg by mouth daily.     Bioflavonoid Products (BIOFLEX PO) Take 2 tablets by mouth daily.     Biotin 10000 MCG TABS Take 10,000 mcg by mouth daily.     Calcium Carb-Cholecalciferol 600-20 MG-MCG TABS Take 1 tablet by mouth daily.     clindamycin (CLEOCIN) 2 % vaginal cream Apply one applicator full at bedtime for 1 week then twice a week. 80 g 3   clobetasol (TEMOVATE) 0.05 % external solution Apply topically 2 (two) times daily.     COLLAGEN PO Take 3 capsules by mouth daily.     ferrous sulfate 325 (65 FE) MG tablet Take 325 mg by mouth every other day.     flecainide (TAMBOCOR) 100 MG tablet Take 1 tablet (100 mg total) by mouth 2 (two) times daily. 180 tablet 3   Fluocinolone Acetonide Scalp (DERMA-SMOOTHE/FS SCALP) 0.01 % OIL Apply 1 application topically daily. 118.28 mL 0    furosemide (LASIX) 20 MG tablet Take 1 tablet (20 mg total) by mouth daily. 90 tablet 3   labetalol (NORMODYNE) 300 MG tablet Take 2 tablets (600 mg total) by mouth 2 (two) times daily. 360 tablet 1   losartan (COZAAR) 100 MG tablet Take 1 tablet (100 mg total) by mouth daily. 90 tablet 1   misoprostol (CYTOTEC) 200 MCG tablet Take 1 tablet (200 mcg total) by mouth 4 (four) times daily. 3 tablet 0   Multiple Minerals-Vitamins (GNP CAL MAG ZINC +D3 PO) Take 3 tablets by mouth daily.     Multiple Vitamin (MULTIVITAMIN) tablet Take 1 tablet by mouth daily.       naproxen (NAPROSYN) 500 MG tablet Take 1 tablet (500 mg total) by mouth 2 (two) times daily as needed. 60 tablet 3   NON FORMULARY Take by mouth daily. 2 tablespoons olive oil     OVER THE COUNTER MEDICATION Take 1,000-2,000 mg by mouth daily. [place] qu[place]     predniSONE (DELTASONE) 50 MG tablet One tab PO daily for 5 days. 5 tablet 0   Red Clover Leaf Extract 500 MG TABS Take 500 mg by mouth daily.     vitamin B-12 (CYANOCOBALAMIN) 1000 MCG tablet Take 1,000 mcg by mouth daily.     vitamin E 180 MG (400 UNITS) capsule Take 400 Units by mouth daily.     No current facility-administered medications for this visit.  Past Medical History:  Diagnosis Date   Anemia    Back pain    Benign positional vertigo    Cardiomyopathy ([place])    Carpal tunnel syndrome    Goiter    nontoxid mutinodular   Hypertension    Hyperthyroidism    Insulin resistance syndrome    Menopausal disorder    Menorrhagia    Migraine    Muscle spasm    trapezius   Neck pain, acute    Obesity    Seborrheic dermatitis    Thyroid disorder     Past Surgical History:  Procedure Laterality Date   DILATION AND CURETTAGE OF UTERUS  04/09   ENDOMETRIAL ABLATION  04/09   GREAT TOE ARTHRODESIS, METATARSALPHALANGEAL JOINT  6/07   Rt foot   HEEL SPUR SURGERY  11/87   Lt foot   ICD GENERATOR CHANGEOUT N/A 01/18/2022   Procedure: ICD GENERATOR CHANGEOUT;  Surgeon:  Alice Haw, MD;  Location: [place] [facility];  Service: Cardiovascular;  Laterality: N/A;   TUBAL LIGATION  11/90    Social History   Socioeconomic History   Marital status: Divorced    Spouse name: Not on file   Number of children: 3   Years of education: 14   Highest education level: Not on file  Occupational History   Occupation: unemployed    Employer: [id number]  Tobacco Use   Smoking status: Former    Types: Cigarettes    Quit date: 09/22/2004    Years since quitting: 18.1   Smokeless tobacco: Never  Vaping Use   Vaping Use: Never used  Substance and Sexual Activity   Alcohol use: Yes    Alcohol/week: 1.0 standard drink of alcohol    Types: 1 Standard drinks or equivalent per week    Comment: 1 weekly   Drug use: No   Sexual activity: Yes    Birth control/protection: Post-menopausal  Other Topics Concern   Not on file  Social History Narrative   3-4 caffeinated drinks per day   No regular exercise   Social Determinants of Health   Financial Resource Strain: Low Risk  (08/25/2021)   Overall Financial Resource Strain (CARDIA)    Difficulty of Paying Living Expenses: Not hard at all  Food Insecurity: Food Insecurity Present (08/01/2022)   Hunger Vital Sign    Worried About Running Out of Food in the Last Year: Often true    Ran Out of Food in the Last Year: Often true  Transportation Needs: No Transportation Needs (07/16/2022)   PRAPARE - Hydrologist (Medical): No    Lack of Transportation (Non-Medical): No  Physical Activity: Sufficiently Active (08/25/2021)   Exercise Vital Sign    Days of Exercise per Week: 5 days    Minutes of Exercise per Session: 30 min  Stress: No Stress Concern Present (07/16/2022)   [place]    Feeling of Stress : Only a little  Social Connections: Moderately Isolated (08/25/2021)   Social Connection and Isolation Panel [NHANES]     Frequency of Communication with Friends and Family: More than three times a week    Frequency of Social Gatherings with Friends and Family: More than three times a week    Attends Religious Services: 1 to 4 times per year    Active Member of [employer] or Organizations: No    Attends Archivist Meetings: Never    Marital Status: Divorced  Intimate Partner Violence: Not At Risk (08/25/2021)   Humiliation, Afraid, Rape, and Kick questionnaire    Fear of Current or Ex-Partner: No    Emotionally Abused: No    Physically Abused: No    Sexually Abused: No    Family History  Problem Relation Age of Onset   Other Mother        MS   Hypertension Father    Hypothyroidism Sister    Hypertension Sister    Diabetes Other     ROS: no fevers or chills, productive cough, hemoptysis, dysphasia, odynophagia, melena, hematochezia, dysuria, hematuria, rash, seizure activity, orthopnea, PND, pedal edema, claudication. Remaining systems are negative.  Physical Exam: Well-developed well-nourished in no acute distress.  Skin is warm and dry.  HEENT is normal.  Neck is supple.  Chest is clear to auscultation with normal expansion.  Cardiovascular exam is regular rate and rhythm.  Abdominal exam nontender or distended. No masses palpated. Extremities show no edema. neuro grossly intact  ECG- personally reviewed  A/P  1 history of nonischemic cardiomyopathy-LV function is mildly reduced on her most recent echocardiogram.  Continue ARB.  She has not tolerated beta-blockers previously.  2 history of elevated calcium score-intolerant to statins.  Will continue aspirin.  3 hypertension-  4 hyperlipidemia-patient is intolerant to statins.  5 history of PVCs-continue flecainide.  6 CRT-D-per Dr. Elberta Fortis.  Olga Millers, MD

## 2022-11-18 NOTE — Telephone Encounter (Signed)
Spoke with pt, she has transportation problems and was not able to get to the appointment today. She relies on [employer] for rides to appointments. She is not sure about her blood pressure either because her machine broke. She is feeling palpitations and occ headache esp at night. Aware will check with social worker at the office about the taxi cards we have to use. Aware do not think virtual visit would work without knowing her blood pressure. Aware will call her back tomorrow once I get to [place].

## 2022-11-18 NOTE — Telephone Encounter (Signed)
Pt states that she was unable to make her appt today due to lack of transportation, but would like a call back to discuss medication management and to see if there is another option for her to be seen through video or phone visit due to lack of transportation. Requesting call back.

## 2022-11-18 NOTE — Telephone Encounter (Signed)
Follow up scheduled

## 2022-11-18 NOTE — Progress Notes (Deleted)
HPI: FU CM.  Previously followed by Mayra Reel, MD in [place]. She has a history of echocardiogram in 2014 showed ejection fraction 25 to 30% with left bundle branch block.  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  She had CRT-D in 2015.  CTA in July 2021 showed minimal nonobstructive coronary disease with calcium score 34.  Echocardiogram repeated May 2021 showed ejection fraction 55 to 60%.  Her PVCs are now treated with flecainide. Patient previously moved back to this area from [place] state.  Follow-up echocardiogram here September 2022 showed ejection fraction 45 to 50%, moderate left ventricular hypertrophy, grade 1 diastolic dysfunction, mild mitral regurgitation. Patient had ICD generator change January 2023.  Note by report patient did not tolerate carvedilol, Toprol,, valsartan, amlodipine, chlorthalidone, diltiazem, lisinopril, spironolactone, Cardura.  Placed on losartan and labetalol recently.  Contacted the office and could not tolerate labetalol.  She is intolerant to statins.  Since last seen   Current Outpatient Medications  Medication Sig Dispense Refill   Ascorbic Acid (VITAMIN C) 1000 MG tablet Take 1,000 mg by mouth daily.       aspirin 81 MG EC tablet Take 81 mg by mouth daily.     Bioflavonoid Products (BIOFLEX PO) Take 2 tablets by mouth daily.     Biotin 96222 MCG TABS Take 10,000 mcg by mouth daily.     Calcium Carb-Cholecalciferol 600-20 MG-MCG TABS Take 1 tablet by mouth daily.     clindamycin (CLEOCIN) 2 % vaginal cream Apply one applicator full at bedtime for 1 week then twice a week. 80 g 3   clobetasol (TEMOVATE) 0.05 % external solution Apply topically 2 (two) times daily.     COLLAGEN PO Take 3 capsules by mouth daily.     ferrous sulfate 325 (65 FE) MG tablet Take 325 mg by mouth every other day.     flecainide (TAMBOCOR) 100 MG tablet Take 1 tablet (100 mg total) by mouth 2 (two) times daily. 180 tablet 3    Fluocinolone Acetonide Scalp (DERMA-SMOOTHE/FS SCALP) 0.01 % OIL Apply 1 application topically daily. 118.28 mL 0   furosemide (LASIX) 20 MG tablet Take 1 tablet (20 mg total) by mouth daily. 90 tablet 3   labetalol (NORMODYNE) 300 MG tablet Take 2 tablets (600 mg total) by mouth 2 (two) times daily. 360 tablet 1   losartan (COZAAR) 100 MG tablet Take 1 tablet (100 mg total) by mouth daily. 90 tablet 1   misoprostol (CYTOTEC) 200 MCG tablet Take 1 tablet (200 mcg total) by mouth 4 (four) times daily. 3 tablet 0   Multiple Minerals-Vitamins (GNP CAL MAG ZINC +D3 PO) Take 3 tablets by mouth daily.     Multiple Vitamin (MULTIVITAMIN) tablet Take 1 tablet by mouth daily.       naproxen (NAPROSYN) 500 MG tablet Take 1 tablet (500 mg total) by mouth 2 (two) times daily as needed. 60 tablet 3   NON FORMULARY Take by mouth daily. 2 tablespoons olive oil     OVER THE COUNTER MEDICATION Take 1,000-2,000 mg by mouth daily. [place]     predniSONE (DELTASONE) 50 MG tablet One tab PO daily for 5 days. 5 tablet 0   Red Clover Leaf Extract 500 MG TABS Take 500 mg by mouth daily.     vitamin B-12 (CYANOCOBALAMIN) 1000 MCG tablet Take 1,000 mcg by mouth daily.     vitamin E 180 MG (400 UNITS) capsule Take  400 Units by mouth daily.     No current facility-administered medications for this visit.     Past Medical History:  Diagnosis Date   Anemia    Back pain    Benign positional vertigo    Cardiomyopathy ([place])    Carpal tunnel syndrome    Goiter    nontoxid mutinodular   Hypertension    Hyperthyroidism    Insulin resistance syndrome    Menopausal disorder    Menorrhagia    Migraine    Muscle spasm    trapezius   Neck pain, acute    Obesity    Seborrheic dermatitis    Thyroid disorder     Past Surgical History:  Procedure Laterality Date   DILATION AND CURETTAGE OF UTERUS  04/09   ENDOMETRIAL ABLATION  04/09   GREAT TOE ARTHRODESIS, METATARSALPHALANGEAL JOINT  6/07   Rt foot   HEEL SPUR  SURGERY  11/87   Lt foot   ICD GENERATOR CHANGEOUT N/A 01/18/2022   Procedure: ICD GENERATOR CHANGEOUT;  Surgeon: Constance Haw, MD;  Location: [facility];  Service: Cardiovascular;  Laterality: N/A;   TUBAL LIGATION  11/90    Social History   Socioeconomic History   Marital status: Divorced    Spouse name: Not on file   Number of children: 3   Years of education: 14   Highest education level: Not on file  Occupational History   Occupation: unemployed    Employer: [id number]  Tobacco Use   Smoking status: Former    Types: Cigarettes    Quit date: 09/22/2004    Years since quitting: 18.1   Smokeless tobacco: Never  Vaping Use   Vaping Use: Never used  Substance and Sexual Activity   Alcohol use: Yes    Alcohol/week: 1.0 standard drink of alcohol    Types: 1 Standard drinks or equivalent per week    Comment: 1 weekly   Drug use: No   Sexual activity: Yes    Birth control/protection: Post-menopausal  Other Topics Concern   Not on file  Social History Narrative   3-4 caffeinated drinks per day   No regular exercise   Social Determinants of Health   Financial Resource Strain: Low Risk  (08/25/2021)   Overall Financial Resource Strain (CARDIA)    Difficulty of Paying Living Expenses: Not hard at all  Food Insecurity: Food Insecurity Present (08/01/2022)   Hunger Vital Sign    Worried About Running Out of Food in the Last Year: Often true    Ran Out of Food in the Last Year: Often true  Transportation Needs: No Transportation Needs (07/16/2022)   PRAPARE - Hydrologist (Medical): No    Lack of Transportation (Non-Medical): No  Physical Activity: Sufficiently Active (08/25/2021)   Exercise Vital Sign    Days of Exercise per Week: 5 days    Minutes of Exercise per Session: 30 min  Stress: No Stress Concern Present (07/16/2022)   [place]    Feeling of Stress : Only a  little  Social Connections: Moderately Isolated (08/25/2021)   Social Connection and Isolation Panel [NHANES]    Frequency of Communication with Friends and Family: More than three times a week    Frequency of Social Gatherings with Friends and Family: More than three times a week    Attends Religious Services: 1 to 4 times per year    Active Member of  Clubs or Organizations: No    Attends Archivist Meetings: Never    Marital Status: Divorced  Human resources officer Violence: Not At Risk (08/25/2021)   Humiliation, Afraid, Rape, and Kick questionnaire    Fear of Current or Ex-Partner: No    Emotionally Abused: No    Physically Abused: No    Sexually Abused: No    Family History  Problem Relation Age of Onset   Other Mother        MS   Hypertension Father    Hypothyroidism Sister    Hypertension Sister    Diabetes Other     ROS: no fevers or chills, productive cough, hemoptysis, dysphasia, odynophagia, melena, hematochezia, dysuria, hematuria, rash, seizure activity, orthopnea, PND, pedal edema, claudication. Remaining systems are negative.  Physical Exam: Well-developed well-nourished in no acute distress.  Skin is warm and dry.  HEENT is normal.  Neck is supple.  Chest is clear to auscultation with normal expansion.  Cardiovascular exam is regular rate and rhythm.  Abdominal exam nontender or distended. No masses palpated. Extremities show no edema. neuro grossly intact  ECG- personally reviewed  A/P  1 nonischemic cardiomyopathy-as outlined previously LV function is mildly reduced on most recent echocardiogram.  Continue ARB.  She has not tolerated beta-blockers.  2 history of mildly elevated calcium score.  Continue aspirin.  She is intolerant to statins.  3 hyperlipidemia-intolerant to statins.  4 hypertension-  5 PVCs-continue flecainide.  6 history of CRT-D-followed by Dr. Curt Bears.  Kirk Ruths, MD

## 2022-11-19 NOTE — Telephone Encounter (Signed)
Spoke with pt, aware of the transportation options. Appointment rescheduled. She will let me know prior to appointment if she needs help with transportation.

## 2022-11-19 NOTE — Telephone Encounter (Signed)
H&V Care Navigation CSW Progress Note  Clinical Social Worker  spoke with Stanton Kidney, RN regarding pt need for assistance with ride  to rescheduled appt. Pt has UHC Medicare and Medicaid, having issues with UHC cancellations per reports. We can assist with a cab to upcoming appt but pt will need to utilize community resources including [employer] and [employer] [facility] [place] if unable to utilize [employer] (or [employer]?) I will mail pt these resources and my card. Once appt rescheduled I requested Stanton Kidney let me know so I can assist with a ride/review waiver w/ pt.   Patient is participating in a Managed Medicaid Plan:  No, UHC Medicare (maybe Humana?) and Medicaid of [place].  SDOH Screenings   Food Insecurity: Food Insecurity Present (08/01/2022)  Housing: Low Risk  (08/25/2021)  Transportation Needs: No Transportation Needs (07/16/2022)  Depression (PHQ2-9): Low Risk  (04/11/2022)  Financial Resource Strain: Low Risk  (08/25/2021)  Physical Activity: Sufficiently Active (08/25/2021)  Social Connections: Moderately Isolated (08/25/2021)  Stress: No Stress Concern Present (07/16/2022)  Tobacco Use: Medium Risk (11/04/2022)   Octavio Graves, MSW, LCSW Clinical Social Worker II [facility] Heart/Vascular Care Navigation  [phone]- work cell phone (preferred) [phone]- desk phone

## 2022-11-21 ENCOUNTER — Encounter: Payer: Self-pay | Admitting: Obstetrics and Gynecology

## 2022-11-21 ENCOUNTER — Ambulatory Visit: Payer: 59

## 2022-11-21 DIAGNOSIS — N3946 Mixed incontinence: Secondary | ICD-10-CM | POA: Diagnosis not present

## 2022-11-21 DIAGNOSIS — M6281 Muscle weakness (generalized): Secondary | ICD-10-CM

## 2022-11-21 DIAGNOSIS — R293 Abnormal posture: Secondary | ICD-10-CM

## 2022-11-21 DIAGNOSIS — R279 Unspecified lack of coordination: Secondary | ICD-10-CM

## 2022-11-21 NOTE — Therapy (Addendum)
OUTPATIENT PHYSICAL THERAPY TREATMENT NOTE   Patient Name: Alice Stewart MRN: [id number] DOB:1956/08/14, 66 y.o., female Today's Date: 11/21/2022  PCP: Samuel Bouche, NP REFERRING PROVIDER: Owens Loffler, DO  END OF SESSION:   PT End of Session - 11/21/22 1604     Visit Number 2    Date for PT Re-Evaluation 12/30/22    Authorization Type UHC Medicare    PT Start Time 1604    PT Stop Time 1645    PT Time Calculation (min) 41 min    Activity Tolerance Patient tolerated treatment well    Behavior During Therapy WFL for tasks assessed/performed             Past Medical History:  Diagnosis Date   Anemia    Back pain    Benign positional vertigo    Cardiomyopathy ([place])    Carpal tunnel syndrome    Goiter    nontoxid mutinodular   Hypertension    Hyperthyroidism    Insulin resistance syndrome    Menopausal disorder    Menorrhagia    Migraine    Muscle spasm    trapezius   Neck pain, acute    Obesity    Seborrheic dermatitis    Thyroid disorder    Past Surgical History:  Procedure Laterality Date   DILATION AND CURETTAGE OF UTERUS  04/09   ENDOMETRIAL ABLATION  04/09   GREAT TOE ARTHRODESIS, METATARSALPHALANGEAL JOINT  6/07   Rt foot   HEEL SPUR SURGERY  11/87   Lt foot   ICD GENERATOR CHANGEOUT N/A 01/18/2022   Procedure: ICD GENERATOR CHANGEOUT;  Surgeon: Constance Haw, MD;  Location: [facility];  Service: Cardiovascular;  Laterality: N/A;   TUBAL LIGATION  11/90   Patient Active Problem List   Diagnosis Date Noted   Mixed incontinence 09/10/2022   Chronic right hip pain 08/05/2022   Recurrent BV (bacterial vaginosis) 07/02/2022   Patellofemoral pain syndrome of both knees 05/01/2022   Yeast detected 04/11/2022   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 23/46/8873   Chronic systolic heart failure ([place]) 01/08/2022   Abdominal pain, acute, generalized 08/08/2021   Right ureteral stone 08/08/2021   Left foot pain 07/29/2021   Cervical  radiculopathy 07/29/2021   Encounter for removal of ureteral stent 06/13/2021   Depression 06/30/2018   Presence of cardiac pacemaker 03/22/2015   Fatty liver 03/22/2015   Fibromyalgia 09/30/2012   Hyperthyroidism 02/18/2011   Anemia 02/25/2008   OBESITY NOS 03/05/2007   Back pain 03/05/2007   MUSCLE SPASM, TRAPEZIUS 01/26/2007   MIGRAINE, COMMON W/O INTRACTABLE MIGRAINE 10/29/2006   GOITER, NONTOXIC MULTINODULAR 10/06/2006   HYPERCHOLESTEROLEMIA 09/30/2006   Hypertension 09/30/2006    REFERRING DIAG: N39.46 (ICD-10-CM) - Mixed incontinence  THERAPY DIAG:  Abnormal posture  Muscle weakness (generalized)  Unspecified lack of coordination  Rationale for Evaluation and Treatment Rehabilitation  PERTINENT HISTORY: D&C, endometrial ablation, G3P3  PRECAUTIONS: NA  SUBJECTIVE:  SUBJECTIVE STATEMENT:   Pt is not taking labetalol any longer and states that she is not having any urinary urgency or incontinence due to this. She inquires if she should continue physical therapy. She is wondering how long she should take vaginal antibiotic for BV.   PAIN:  Are you having pain? No   11/04/22 SUBJECTIVE STATEMENT: Pt had to go to the hospital this last July due to blood pressure complications; she was placed on new medication and she had side effects including bladder incontinence. She never had issues with incontinence prior to this. She has to run to the bathroom in the morning in addition to stress urinary incontinence. She does have low back pain. She is currently struggling with bacterial vaginosis. She would like to have surgery for abdominal fat if that is what it will take to get her better.  Fluid intake: Yes: 4-5 glasses a day; coffee     PAIN:  Are you having pain? Yes NPRS scale: 8/10 Pain  location:  low back   Pain type: aching and throbbing Pain description: intermittent    Aggravating factors: prolonged standing Relieving factors: sitting, heating pad   PRECAUTIONS: None   WEIGHT BEARING RESTRICTIONS: No   FALLS:  Has patient fallen in last 6 months? No   LIVING ENVIRONMENT: Lives with: lives alone Lives in: House/apartment     OCCUPATION: not working   PLOF: Independent   PATIENT GOALS: stop incontinence   PERTINENT HISTORY:  D&C, endometrial ablation, G3P3 Sexual abuse: No   BOWEL MOVEMENT: Pain with bowel movement: No Type of bowel movement:Frequency every 2 days  and Strain No Fully empty rectum: Yes: - Leakage: No Pads: No Fiber supplement: No   URINATION: Pain with urination: No Fully empty bladder: Yes: - Stream: Strong Urgency: Yes: first thing in the morning is the worst Frequency: every 2 hours Leakage: Urge to void, Walking to the bathroom, Coughing, Sneezing, and Laughing Pads: Yes: wears pad to bed and throughout the day  - changing at least twice a day   INTERCOURSE: Pain with intercourse:  not currently Ability to have vaginal penetration:  Yes: - Climax: - Marinoff Scale: 0/3   PREGNANCY: Vaginal deliveries 3 Tearing No C-section deliveries 0 Currently pregnant No   PROLAPSE: Weight of basketball sitting in pelvis   OBJECTIVE:  11/04/22   COGNITION: Overall cognitive status: Within functional limits for tasks assessed                          SENSATION: Light touch: Appears intact Proprioception: Appears intact   MUSCLE LENGTH:     FUNCTIONAL TESTS:  Breath holding with bed mobility   GAIT:   Comments: decreased bil hip extension   POSTURE: rounded shoulders, forward head, increased lumbar lordosis, and anterior pelvic tilt       PALPATION:   General  mild left lower quadrant tenderness                 External Perineal Exam redness                             Internal Pelvic Floor WNL    Patient confirms identification and approves PT to assess internal pelvic floor and treatment Yes   PELVIC MMT:   MMT eval  Vaginal 1/5, poor coordination; endurance 3 seconds; repeat contractions 5  Internal Anal Sphincter    External Anal Sphincter  Puborectalis    Diastasis Recti 3 finger width separation with notable distortion; unable to sit-up from supine  (Blank rows = not tested)         TONE: low   PROLAPSE:  Grade 2 anterior vaginal wall laxity   TODAY'S TREATMENT 11/21/22:      Exercises: Strengthening: Hip adduction seated with ball 2 x 10 Hip abduction seated with green band 2 x 10 Seated marching 2 x 10 Self-care: Extensive discussion about if therapy will still be beneficial to her even though her urinary symptoms are better Messaged MD on her behalf to clarify use of antibiotic cream for BV Regular orgasm for good vaginal health                                                                                                                          DATE: 11/04/22  EVAL  Neuromuscular re-education: Pelvic floor contraction training: Contraction training Quick flicks 35K Long holds 5 x 10 seconds Self-care: Urge suppression technique The knack Abdominal pressure management        PATIENT EDUCATION:  Education details: see above self-care Person educated: Patient Education method: Explanation, Demonstration, Tactile cues, Verbal cues, and Handouts Education comprehension: verbalized understanding   HOME EXERCISE PROGRAM: [id number]   ASSESSMENT:   CLINICAL IMPRESSION: Pt has had complete reduction of urinary urgency and incontinence and believes this is due to cessation of labetalol. She inquired if she should continue PT without urinary symptoms; we had extensive discussion about the impairments that we found in pelvic floor and that continuing PT could help reduce low back pain that may be in part from pelvic floor/core weakness and anterior  vaginal wall laxity. She was able to start some core and hip strengthening this session with incorporation of pelvic floor contraction and appropriate pressure management. She will continue to benefit from skilled PT intervention in order to decrease urge and stress urinary incontinence, improve low back pain/constipation, improve abdominal pressure management to prevent worsening of anterior vaginal wall laxity, and improve QOL.    OBJECTIVE IMPAIRMENTS: decreased activity tolerance, decreased coordination, decreased endurance, decreased strength, increased fascial restrictions, increased muscle spasms, impaired tone, postural dysfunction, and pain.    ACTIVITY LIMITATIONS: continence   PARTICIPATION LIMITATIONS: community activity   PERSONAL FACTORS: 1-2 comorbidities: D&C, endometrial ablation, G3P3  are also affecting patient's functional outcome.    REHAB POTENTIAL: Good   CLINICAL DECISION MAKING: Stable/uncomplicated   EVALUATION COMPLEXITY: Low     GOALS: Goals reviewed with patient? Yes   SHORT TERM GOALS: Target date: 12/02/2022   Pt will be independent with HEP.    Baseline: Goal status: INITIAL   2.  Pt will be independent with the knack, urge suppression technique, and double voiding in order to improve bladder habits and decrease urinary incontinence.    Baseline:  Goal status:    3.  Pt will be independent with use of squatty potty, relaxed toileting mechanics, and improved bowel movement techniques  in order to increase ease of bowel movements and complete evacuation.    Baseline:  Goal status:    4.  Pt will be able to correctly perform diaphragmatic breathing and appropriate pressure management in order to prevent worsening vaginal wall laxity and improve pelvic floor A/ROM.    Baseline:  Goal status: INITIAL     LONG TERM GOALS: Target date: 12/30/2022    Pt will be independent with advanced HEP.    Baseline:  Goal status: INITIAL   2.  Pt will  demonstrate normal pelvic floor muscle tone and A/ROM, able to achieve 3/5 strength with contractions and 10 sec endurance, in order to provide appropriate lumbopelvic support in functional activities.    Baseline:  Goal status: INITIAL   3.  Pt will not have severe urinary urgency and leaking in the morning when waking and will be able to make it to the bathroom. Baseline:  Goal status: INITIAL   4.  Pt will report no greater than 2/10 low back pain with standing >1 hour in order to be able to cook without difficulty. Baseline:  Goal status: INITIAL   5.  Pt will report ability to have >5 bowel movements a week without difficulty or straining in order to put less pressure on bladder and reduce urinary incontinence.  Baseline:  Goal status: INITIAL     PLAN:   PT FREQUENCY: 1x/week   PT DURATION: 8 weeks   PLANNED INTERVENTIONS: Therapeutic exercises, Therapeutic activity, Neuromuscular re-education, Balance training, Gait training, Patient/Family education, Self Care, Joint mobilization, Dry Needling, Biofeedback, and Manual therapy   PLAN FOR NEXT SESSION: Progress core and pelvic floor strengthening with good pressure management.   Heather Roberts, PT, DPT11/30/235:00 PM  PHYSICAL THERAPY DISCHARGE SUMMARY  Visits from Start of Care: 1  Current functional level related to goals / functional outcomes: Incomplete   Remaining deficits: See above   Education / Equipment: HEP   Patient agrees to discharge. Patient goals were partially met. Patient is being discharged due to the patient's request.  Heather Roberts, PT, DPT12/26/239:24 AM

## 2022-11-22 ENCOUNTER — Telehealth: Payer: Self-pay | Admitting: Licensed Clinical Social Worker

## 2022-11-22 NOTE — Progress Notes (Signed)
  Heart and Vascular Care Navigation  11/22/2022  Alice Stewart [birth year] [id number]  Reason for Referral: transportation challenges Patient is participating in a Managed Medicaid Plan:No, [facility] Medicare and Medicaid of [place].   Engaged with patient by telephone for initial visit for Heart and Vascular Care Coordination.                                                                                                   Assessment:      LCSW spoke with pt via telephone at [phone]. Introduced self, role, reason for call. Pt confirmed home address, has Medicare and Medicaid benefits. Has been having issues with [facility] Medicare transportation cancelling due to lack of drivers with [employer] and [employer]. She is frustrated b/c it means she has to no show/cancel at the last minute. She has multiple upcoming appts including with [facility] on 12/13. LCSW discussed various options pt has including Medicaid transportation and [employer]. She will need to call both to enroll in services before she is eligible to utilize. Pt states understanding. No additional questions at this time.                                  HRT/VAS Care Coordination     Patients Home Cardiology Office --  [facility]   Outpatient Care Team Social Worker   Social Worker Name: Octavio Graves, [place], [phone]   Living arrangements for the past 2 months Apartment   Lives with: Self   Patient Current Insurance Coverage Managed Medicare; Medicaid   Patient Has Concern With Paying Medical Bills No   Does Patient Have Prescription Coverage? Yes       Social History:                                                                             SDOH Screenings   Food Insecurity: Food Insecurity Present (08/01/2022)  Housing: Low Risk  (08/25/2021)  Transportation Needs: Unmet Transportation Needs (11/22/2022)  Depression (PHQ2-9): Low Risk  (04/11/2022)  Financial Resource Strain: Low Risk  (08/25/2021)  Physical Activity:  Sufficiently Active (08/25/2021)  Social Connections: Moderately Isolated (08/25/2021)  Stress: No Stress Concern Present (07/16/2022)  Tobacco Use: Medium Risk (11/21/2022)    SDOH Interventions: Transportation:   Transportation Interventions: Payor Benefit    Follow-up plan:   LCSW mailed the following to pt on 11/28- my card, Environmental health practitioner which includes [employer] number, and [facility] [place]. Encouraged her to call both today to discuss enrollment as pt has several upcoming appointments. I will f/u next week to ensure pt has ride for the following week.

## 2022-11-26 ENCOUNTER — Telehealth: Payer: Self-pay | Admitting: General Practice

## 2022-11-26 NOTE — Telephone Encounter (Signed)
Attempt to call patient to schedule medicare wellness visit. Phone number dialed multiple times but kept getting disconnected.

## 2022-11-26 NOTE — Progress Notes (Signed)
HPI: FU CM.  Previously followed by Marshall Cork, MD in [place]. She has a history of echocardiogram in 2014 showed ejection fraction 25 to 30% with left bundle branch block.  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  She had CRT-D in 2015.  CTA in July 2021 showed minimal nonobstructive coronary disease with calcium score 34.  Echocardiogram repeated May 2021 showed ejection fraction 55 to 60%.  Her PVCs are now treated with flecainide. Patient previously moved back to this area from [place] state.  Follow-up echocardiogram here September 2022 showed ejection fraction 45 to 50%, moderate left ventricular hypertrophy, grade 1 diastolic dysfunction, mild mitral regurgitation. Patient had ICD generator change January 2023.  Note by report patient did not tolerate carvedilol, Toprol, Entresto, spironolactone, Cardura and recently discontinued labetalol.  She is intolerant to statins.  Since last seen she denies dyspnea, chest pain, palpitations or syncope.  Current Outpatient Medications  Medication Sig Dispense Refill   Ascorbic Acid (VITAMIN C) 1000 MG tablet Take 1,000 mg by mouth daily.       aspirin 81 MG EC tablet Take 81 mg by mouth daily.     Bioflavonoid Products (BIOFLEX PO) Take 2 tablets by mouth daily.     Biotin 10000 MCG TABS Take 10,000 mcg by mouth daily.     Calcium Carb-Cholecalciferol 600-20 MG-MCG TABS Take 1 tablet by mouth daily.     clobetasol (TEMOVATE) 0.05 % external solution Apply topically 2 (two) times daily.     COLLAGEN PO Take 3 capsules by mouth daily.     ferrous sulfate 325 (65 FE) MG tablet Take 325 mg by mouth every other day.     flecainide (TAMBOCOR) 100 MG tablet Take 1 tablet (100 mg total) by mouth 2 (two) times daily. 180 tablet 3   Fluocinolone Acetonide Scalp (DERMA-SMOOTHE/FS SCALP) 0.01 % OIL Apply 1 application topically daily. 118.28 mL 0   furosemide (LASIX) 20 MG tablet Take 1 tablet (20 mg total) by  mouth daily. 90 tablet 3   Lactobacillus (PROBIOTIC ACIDOPHILUS PO) Take by mouth.     losartan (COZAAR) 100 MG tablet Take 1 tablet (100 mg total) by mouth daily. 90 tablet 1   Multiple Minerals-Vitamins (GNP CAL MAG ZINC +D3 PO) Take 3 tablets by mouth daily.     Multiple Vitamin (MULTIVITAMIN) tablet Take 1 tablet by mouth daily.       naproxen (NAPROSYN) 500 MG tablet Take 1 tablet (500 mg total) by mouth 2 (two) times daily as needed. 60 tablet 3   NON FORMULARY Take by mouth daily. 2 tablespoons olive oil     OVER THE COUNTER MEDICATION Take 1,000-2,000 mg by mouth daily. Dong quai     Red Clover Leaf Extract 500 MG TABS Take 500 mg by mouth daily.     vitamin B-12 (CYANOCOBALAMIN) 1000 MCG tablet Take 1,000 mcg by mouth daily.     vitamin E 180 MG (400 UNITS) capsule Take 400 Units by mouth daily.     clindamycin (CLEOCIN) 2 % vaginal cream Apply one applicator full at bedtime for 1 week then twice a week. (Patient not taking: Reported on 12/04/2022) 80 g 3   labetalol (NORMODYNE) 300 MG tablet Take 2 tablets (600 mg total) by mouth 2 (two) times daily. (Patient not taking: Reported on 11/21/2022) 360 tablet 1   misoprostol (CYTOTEC) 200 MCG tablet Take 1 tablet (200 mcg total) by mouth 4 (four) times  daily. (Patient not taking: Reported on 12/04/2022) 3 tablet 0   predniSONE (DELTASONE) 50 MG tablet One tab PO daily for 5 days. (Patient not taking: Reported on 12/04/2022) 5 tablet 0   No current facility-administered medications for this visit.     Past Medical History:  Diagnosis Date   Anemia    Back pain    Benign positional vertigo    Cardiomyopathy (HCC)    Carpal tunnel syndrome    Goiter    nontoxid mutinodular   Hypertension    Hyperthyroidism    Insulin resistance syndrome    Menopausal disorder    Menorrhagia    Migraine    Muscle spasm    trapezius   Neck pain, acute    Obesity    Seborrheic dermatitis    Thyroid disorder     Past Surgical History:   Procedure Laterality Date   DILATION AND CURETTAGE OF UTERUS  04/09   ENDOMETRIAL ABLATION  04/09   GREAT TOE ARTHRODESIS, METATARSALPHALANGEAL JOINT  6/07   Rt foot   HEEL SPUR SURGERY  11/87   Lt foot   ICD GENERATOR CHANGEOUT N/A 01/18/2022   Procedure: ICD GENERATOR CHANGEOUT;  Surgeon: Regan Lemming, MD;  Location: [facility] INVASIVE CV LAB;  Service: Cardiovascular;  Laterality: N/A;   TUBAL LIGATION  11/90    Social History   Socioeconomic History   Marital status: Divorced    Spouse name: Not on file   Number of children: 3   Years of education: 14   Highest education level: Not on file  Occupational History   Occupation: unemployed    Employer: [id number]  Tobacco Use   Smoking status: Former    Types: Cigarettes    Quit date: 09/22/2004    Years since quitting: 18.2   Smokeless tobacco: Never  Vaping Use   Vaping Use: Never used  Substance and Sexual Activity   Alcohol use: Yes    Alcohol/week: 1.0 standard drink of alcohol    Types: 1 Standard drinks or equivalent per week    Comment: 1 weekly   Drug use: No   Sexual activity: Yes    Birth control/protection: Post-menopausal  Other Topics Concern   Not on file  Social History Narrative   3-4 caffeinated drinks per day   No regular exercise   Social Determinants of Health   Financial Resource Strain: Low Risk  (08/25/2021)   Overall Financial Resource Strain (CARDIA)    Difficulty of Paying Living Expenses: Not hard at all  Food Insecurity: Food Insecurity Present (08/01/2022)   Hunger Vital Sign    Worried About Running Out of Food in the Last Year: Often true    Ran Out of Food in the Last Year: Often true  Transportation Needs: Unmet Transportation Needs (11/22/2022)   PRAPARE - Transportation    Lack of Transportation (Medical): Yes    Lack of Transportation (Non-Medical): Yes  Physical Activity: Sufficiently Active (08/25/2021)   Exercise Vital Sign    Days of Exercise per Week: 5 days    Minutes of  Exercise per Session: 30 min  Stress: No Stress Concern Present (07/16/2022)   [employer] of Occupational Health - Occupational Stress Questionnaire    Feeling of Stress : Only a little  Social Connections: Moderately Isolated (08/25/2021)   Social Connection and Isolation Panel [NHANES]    Frequency of Communication with Friends and Family: More than three times a week    Frequency of Social Gatherings with Friends  and Family: More than three times a week    Attends Religious Services: 1 to 4 times per year    Active Member of Clubs or Organizations: No    Attends Archivist Meetings: Never    Marital Status: Divorced  Human resources officer Violence: Not At Risk (08/25/2021)   Humiliation, Afraid, Rape, and Kick questionnaire    Fear of Current or Ex-Partner: No    Emotionally Abused: No    Physically Abused: No    Sexually Abused: No    Family History  Problem Relation Age of Onset   Other Mother        MS   Hypertension Father    Hypothyroidism Sister    Hypertension Sister    Diabetes Other     ROS: no fevers or chills, productive cough, hemoptysis, dysphasia, odynophagia, melena, hematochezia, dysuria, hematuria, rash, seizure activity, orthopnea, PND, pedal edema, claudication. Remaining systems are negative.  Physical Exam: Well-developed obese in no acute distress.  Skin is warm and dry.  HEENT is normal.  Neck is supple.  Chest is clear to auscultation with normal expansion.  Cardiovascular exam is regular rate and rhythm.  Abdominal exam nontender or distended. No masses palpated. Extremities show no edema. neuro grossly intact  ECG-sinus with ventricular pacing.  Personally reviewed   A/P  1 history of nonischemic cardiomyopathy-LV function is mildly reduced on her most recent echocardiogram.  Continue ARB.  She has not tolerated lisinopril, entresto or  beta-blockers previously.  2 history of elevated calcium score-intolerant to statins.  Will  continue aspirin.  3 hypertension-blood pressure controlled.  Continue present medical regimen.  4 hyperlipidemia-patient is intolerant to statins.  5 history of PVCs-continue flecainide.  6 CRT-D-per Dr. Curt Bears.  7 obesity-patient unclear if she snores as she lives alone.  However she would be significant risk for obstructive sleep apnea.  We will arrange home sleep study and initiate therapy as needed.  Kirk Ruths, MD

## 2022-11-27 ENCOUNTER — Ambulatory Visit: Payer: Medicare Other | Admitting: Cardiology

## 2022-12-03 ENCOUNTER — Telehealth: Payer: Self-pay | Admitting: Licensed Clinical Social Worker

## 2022-12-03 NOTE — Telephone Encounter (Signed)
H&V Care Navigation CSW Progress Note  Clinical Social Worker contacted patient by phone to f/u on ride to appt w/ Felton [facility]. Pt states that she arranged it through her [facility] Medicare benefits, I inquired if she had contacted any of the alternate resources that I had provided for her to contact for more dependable rides- she shares she had contacted [facility] but that she opted to go with [facility] Medicare from what I understand bc she states she doesn't have any issues with them? Unclear, but pt does have ride to appt with our providers. No additional questions at this time.    Patient is participating in a Managed Medicaid Plan:  No  SDOH Screenings   Food Insecurity: Food Insecurity Present (08/01/2022)  Housing: Low Risk  (08/25/2021)  Transportation Needs: Unmet Transportation Needs (11/22/2022)  Depression (PHQ2-9): Low Risk  (04/11/2022)  Financial Resource Strain: Low Risk  (08/25/2021)  Physical Activity: Sufficiently Active (08/25/2021)  Social Connections: Moderately Isolated (08/25/2021)  Stress: No Stress Concern Present (07/16/2022)  Tobacco Use: Medium Risk (11/21/2022)   Octavio Graves, MSW, LCSW Clinical Social Worker II [facility] Heart/Vascular Care Navigation  [phone]- work cell phone (preferred) [phone]- desk phone

## 2022-12-04 ENCOUNTER — Encounter: Payer: Self-pay | Admitting: Cardiology

## 2022-12-04 ENCOUNTER — Ambulatory Visit (INDEPENDENT_AMBULATORY_CARE_PROVIDER_SITE_OTHER): Payer: 59 | Admitting: Cardiology

## 2022-12-04 VITALS — BP 132/64 | HR 70 | Ht 64.0 in | Wt 248.0 lb

## 2022-12-04 DIAGNOSIS — I10 Essential (primary) hypertension: Secondary | ICD-10-CM

## 2022-12-04 DIAGNOSIS — E785 Hyperlipidemia, unspecified: Secondary | ICD-10-CM

## 2022-12-04 DIAGNOSIS — R0683 Snoring: Secondary | ICD-10-CM

## 2022-12-04 DIAGNOSIS — I428 Other cardiomyopathies: Secondary | ICD-10-CM

## 2022-12-04 NOTE — Patient Instructions (Signed)
Testing/Procedures:   WatchPAT?  Is a FDA cleared portable home sleep study test that uses a watch and 3 points of contact to monitor 7 different channels, including your heart rate, oxygen saturations, body position, snoring, and chest motion.  The study is easy to use from the comfort of your own home and accurately detect sleep apnea.  Before bed, you attach the chest sensor, attached the sleep apnea bracelet to your nondominant hand, and attach the finger probe.  After the study, the raw data is downloaded from the watch and scored for apnea events.   For more information: [email or web address]  Patient Testing Instructions:  Do not put battery into the device until bedtime when you are ready to begin the test. Please call the support number if you need assistance after following the instructions below: 24 hour support line- [phone] or ITAMAR support at [phone] (option 2)  Download the IntelWatchPAT One" app through the google play store or App Store  Be sure to turn on or enable access to bluetooth in settlings on your smartphone/ device  Make sure no other bluetooth devices are on and within the vicinity of your smartphone/ device and WatchPAT watch during testing.  Make sure to leave your smart phone/ device plugged in and charging all night.  When ready for bed:  Follow the instructions step by step in the WatchPAT One App to activate the testing device. For additional instructions, including video instruction, visit the WatchPAT One video on Youtube. You can search for WatchPat One within Youtube (video is 4 minutes and 18 seconds) or enter: https://youtube/watch?[email or web address]_vbiwxE Please note: You will be prompted to enter a Pin to connect via bluetooth when starting the test. The PIN will be assigned to you when you receive the test.  The device is disposable, but it recommended that you retain the device until you receive a call letting you know the study has  been received and the results have been interpreted.  We will let you know if the study did not transmit to [place] properly after the test is completed. You do not need to call us to confirm the receipt of the test.  Please complete the test within 48 hours of receiving PIN.   Frequently Asked Questions:  What is Watch Dennie Bible one?  A single use fully disposable home sleep apnea testing device and will not need to be returned after completion.  What are the requirements to use WatchPAT one?  The be able to have a successful watchpat one sleep study, you should have your Watch pat one device, your smart phone, watch pat one app, your PIN number and Internet access What type of phone do I need?  You should have a smart phone that uses Android 5.1 and above or any Iphone with IOS 10 and above How can I download the WatchPAT one app?  Based on your device type search for WatchPAT one app either in google play for android devices or APP store for Iphone's Where will I get my PIN for the study?  Your PIN will be provided by your physician's office. It is used for authentication and if you lose/forget your PIN, please reach out to your providers office.  I do not have Internet at home. Can I do WatchPAT one study?  WatchPAT One needs Internet connection throughout the night to be able to transmit the sleep data. You can use your home/local internet or your cellular's data package. However, it is always recommended to use  home/local Internet. It is estimated that between 20MB-30MB will be used with each study.However, the application will be looking for space in the phone to start the study.  What happens if I lose internet or bluetooth connection?  During the internet disconnection, your phone will not be able to transmit the sleep data. All the data, will be stored in your phone. As soon as the internet connection is back on, the phone will being sending the sleep data. During the bluetooth disconnection,  WatchPAT one will not be able to to send the sleep data to your phone. Data will be kept in the [facility] one until two devices have bluetooth connection back on. As soon as the connection is back on, WatchPAT one will send the sleep data to the phone.  How long do I need to wear the WatchPAT one?  After you start the study, you should wear the device at least 6 hours.  How far should I keep my phone from the device?  During the night, your phone should be within 15 feet.  What happens if I leave the room for restroom or other reasons?  Leaving the room for any reason will not cause any problem. As soon as your get back to the room, both devices will reconnect and will continue to send the sleep data. Can I use my phone during the sleep study?  Yes, you can use your phone as usual during the study. But it is recommended to put your watchpat one on when you are ready to go to bed.  How will I get my study results?  A soon as you completed your study, your sleep data will be sent to the provider. They will then share the results with you when they are ready.     Follow-Up: At [facility], you and your health needs are our priority.  As part of our continuing mission to provide you with exceptional heart care, we have created designated Provider Care Teams.  These Care Teams include your primary Cardiologist (physician) and Advanced Practice Providers (APPs -  Physician Assistants and Nurse Practitioners) who all work together to provide you with the care you need, when you need it.  We recommend signing up for the patient portal called "MyChart".  Sign up information is provided on this After Visit Summary.  MyChart is used to connect with patients for Virtual Visits (Telemedicine).  Patients are able to view lab/test results, encounter notes, upcoming appointments, etc.  Non-urgent messages can be sent to your provider as well.   To learn more about what you can do with MyChart, go to  [email or web address].    Your next appointment:   6 month(s)  The format for your next appointment:   In Person  Provider:   Olga Millers, MD

## 2022-12-05 ENCOUNTER — Telehealth: Payer: Self-pay

## 2022-12-05 ENCOUNTER — Telehealth: Payer: Self-pay | Admitting: *Deleted

## 2022-12-05 LAB — BASIC METABOLIC PANEL
BUN: 16 mg/dL (ref 7–25)
CO2: 30 mmol/L (ref 20–32)
Calcium: 10 mg/dL (ref 8.6–10.4)
Chloride: 105 mmol/L (ref 98–110)
Creat: 0.98 mg/dL (ref 0.50–1.05)
Glucose, Bld: 111 mg/dL — ABNORMAL HIGH (ref 65–99)
Potassium: 3.8 mmol/L (ref 3.5–5.3)
Sodium: 144 mmol/L (ref 135–146)

## 2022-12-05 NOTE — Telephone Encounter (Signed)
Called and made the patient aware that SHE may proceed with the Itamar Home Sleep Study. PIN # provided to the patient. Patient made aware that SHE will be contacted after the test has been read with the results and any recommendations. Patient verbalized understanding and thanked me for the call.   

## 2022-12-05 NOTE — Telephone Encounter (Signed)
Notified Alice Stewart ok to activate itamar device.  

## 2022-12-06 ENCOUNTER — Encounter (HOSPITAL_BASED_OUTPATIENT_CLINIC_OR_DEPARTMENT_OTHER): Payer: 59 | Admitting: Cardiology

## 2022-12-06 DIAGNOSIS — G4733 Obstructive sleep apnea (adult) (pediatric): Secondary | ICD-10-CM

## 2022-12-09 ENCOUNTER — Ambulatory Visit: Payer: 59 | Attending: Cardiology

## 2022-12-09 DIAGNOSIS — R0683 Snoring: Secondary | ICD-10-CM

## 2022-12-09 NOTE — Procedures (Signed)
SLEEP STUDY REPORT Patient Information Study Date: 12/06/2022 Patient Name: Alice Stewart Patient ID: [id number] Birth Date: 08/30/1956 Age: 66 Gender: Female BMI: 42.5 (W=249 lb, H=5' 4'') Referring Physician: Olga Millers, MD  TEST DESCRIPTION:  Home sleep apnea testing was completed using the WatchPat, a Type 1 device, utilizing peripheral arterial tonometry (PAT), chest movement, actigraphy, pulse oximetry, pulse rate, body position and snore.  AHI was calculated with apnea and hypopnea using valid sleep time as the denominator. RDI includes apneas, hypopneas, and RERAs.  The data acquired and the scoring of sleep and all associated events were performed in accordance with the recommended standards and specifications as outlined in the AASM Manual for the Scoring of Sleep and Associated Events 2.2.0 (2015).  FINDINGS:  1.  Moderate Obstructive Sleep Apnea with AHI 22.6/hr.   2.  No Central Sleep Apnea with pAHIc 1/hr.  3.  Oxygen desaturations as low as 76%.  4.  Severe snoring was present. O2 sats were < 88% for 26 min.  5.  Total sleep time was 6 hrs and 44 min.  6.  19.8% of total sleep time was spent in REM sleep.   7.  Normal sleep onset latency at 18 min  8.  Normal REM sleep onset latency at 90 min.   9.  Total awakenings were 8.  10. Arrhythmia detection:  None.  DIAGNOSIS:   Moderate Obstructive Sleep Apnea (G47.33) Nocturnal Hypoxemia  RECOMMENDATIONS:   1.  Clinical correlation of these findings is necessary.  The decision to treat obstructive sleep apnea (OSA) is usually based on the presence of apnea symptoms or the presence of associated medical conditions such as Hypertension, Congestive Heart Failure, Atrial Fibrillation or Obesity.  The most common symptoms of OSA are snoring, gasping for breath while sleeping, daytime sleepiness and fatigue.   2.  Initiating apnea therapy is recommended given the presence of symptoms and/or associated conditions. Recommend  proceeding with one of the following:     a.  Auto-CPAP therapy with a pressure range of 5-20cm H2O.     b.  An oral appliance (OA) that can be obtained from certain dentists with expertise in sleep medicine.  These are primarily of use in non-obese patients with mild and moderate disease.     c.  An ENT consultation which may be useful to look for specific causes of obstruction and possible treatment options.     d.  If patient is intolerant to PAP therapy, consider referral to ENT for evaluation for hypoglossal nerve stimulator.   3.  Close follow-up is necessary to ensure success with CPAP or oral appliance therapy for maximum benefit.  4.  A follow-up oximetry study on CPAP is recommended to assess the adequacy of therapy and determine the need for supplemental oxygen or the potential need for Bi-level therapy.  An arterial blood gas to determine the adequacy of baseline ventilation and oxygenation should also be considered.  5.  Healthy sleep recommendations include:  adequate nightly sleep (normal 7-9 hrs/night), avoidance of caffeine after noon and alcohol near bedtime, and maintaining a sleep environment that is cool, dark and quiet.  6.  Weight loss for overweight patients is recommended.  Even modest amounts of weight loss can significantly improve the severity of sleep apnea.  7.  Snoring recommendations include:  weight loss where appropriate, side sleeping, and avoidance of alcohol before bed.  8.  Operation of motor vehicle should not be performed when sleepy.  Signature: Armanda Magic, MD;  [facility]; [place], Tax adviser of Sleep Medicine Electronically Signed: 12/09/2022

## 2022-12-12 ENCOUNTER — Other Ambulatory Visit: Payer: Self-pay | Admitting: Cardiology

## 2022-12-12 ENCOUNTER — Telehealth: Payer: Self-pay | Admitting: *Deleted

## 2022-12-12 DIAGNOSIS — G4733 Obstructive sleep apnea (adult) (pediatric): Secondary | ICD-10-CM

## 2022-12-12 NOTE — Telephone Encounter (Signed)
Patient notified of sleep study results and recommendations. She agrees to proceed with CPAP titiration provided this can be done on the weekend due to transportation.

## 2022-12-12 NOTE — Telephone Encounter (Signed)
-----   Message from Quintella Reichert, MD sent at 12/09/2022  6:12 PM EST ----- Please let patient know that they have sleep apnea.  Recommend therapeutic CPAP titration for treatment of patient's sleep disordered breathing.  If unable to perform an in lab titration then initiate ResMed auto CPAP from 4 to 15cm H2O with heated humidity and mask of choice and overnight pulse ox on CPAP.  Please let patient know that they have sleep apnea.  Recommend therapeutic CPAP titration for treatment of patient's sleep disordered breathing.  If unable to perform an in lab titration then initiate ResMed auto CPAP from 4 to 15cm H2O with heated humidity and mask of choice and overnight pulse ox on CPAP.

## 2022-12-18 ENCOUNTER — Ambulatory Visit: Payer: 59

## 2022-12-24 ENCOUNTER — Ambulatory Visit: Payer: Medicaid Other

## 2022-12-30 ENCOUNTER — Ambulatory Visit: Payer: 59

## 2023-01-17 ENCOUNTER — Ambulatory Visit: Payer: 59 | Attending: Cardiology

## 2023-01-17 ENCOUNTER — Ambulatory Visit (HOSPITAL_BASED_OUTPATIENT_CLINIC_OR_DEPARTMENT_OTHER): Payer: 59 | Attending: Cardiology | Admitting: Cardiology

## 2023-01-17 ENCOUNTER — Telehealth: Payer: Self-pay | Admitting: Cardiology

## 2023-01-17 VITALS — Ht 64.0 in | Wt 240.0 lb

## 2023-01-17 DIAGNOSIS — G4733 Obstructive sleep apnea (adult) (pediatric): Secondary | ICD-10-CM

## 2023-01-17 DIAGNOSIS — I428 Other cardiomyopathies: Secondary | ICD-10-CM

## 2023-01-17 LAB — CUP PACEART REMOTE DEVICE CHECK
Battery Remaining Longevity: 77 mo
Battery Remaining Percentage: 87 %
Battery Voltage: 2.99 V
Brady Statistic AP VP Percent: 53 %
Brady Statistic AP VS Percent: 1 %
Brady Statistic AS VP Percent: 47 %
Brady Statistic AS VS Percent: 1 %
Brady Statistic RA Percent Paced: 53 %
Date Time Interrogation Session: 20240126010039
HighPow Impedance: 65 Ohm
Implantable Lead Connection Status: 753985
Implantable Lead Connection Status: 753985
Implantable Lead Connection Status: 753985
Implantable Lead Implant Date: 20150303
Implantable Lead Implant Date: 20150303
Implantable Lead Implant Date: 20150303
Implantable Lead Location: 753858
Implantable Lead Location: 753859
Implantable Lead Location: 753860
Implantable Pulse Generator Implant Date: 20230127
Lead Channel Impedance Value: 410 Ohm
Lead Channel Impedance Value: 460 Ohm
Lead Channel Impedance Value: 530 Ohm
Lead Channel Pacing Threshold Amplitude: 0.625 V
Lead Channel Pacing Threshold Amplitude: 1.375 V
Lead Channel Pacing Threshold Amplitude: 1.875 V
Lead Channel Pacing Threshold Pulse Width: 0.5 ms
Lead Channel Pacing Threshold Pulse Width: 0.5 ms
Lead Channel Pacing Threshold Pulse Width: 0.7 ms
Lead Channel Sensing Intrinsic Amplitude: 12 mV
Lead Channel Sensing Intrinsic Amplitude: 3.4 mV
Lead Channel Setting Pacing Amplitude: 1.625
Lead Channel Setting Pacing Amplitude: 2.375
Lead Channel Setting Pacing Amplitude: 2.375
Lead Channel Setting Pacing Pulse Width: 0.5 ms
Lead Channel Setting Pacing Pulse Width: 0.7 ms
Lead Channel Setting Sensing Sensitivity: 0.5 mV
Pulse Gen Serial Number: 111057141
Zone Setting Status: 755011

## 2023-01-17 NOTE — Telephone Encounter (Signed)
Informed patient of order for lunesta. She stated she does not have a way to pick up the medication and said not to order it. She also reported that losartan makes her drowsy during the day and she is afraid to take anything that could make the drowsiness worse. She said she will take Unisom 25mg  OTC with her tonight to the sleep study.

## 2023-01-17 NOTE — Telephone Encounter (Signed)
Pt c/o medication issue:  1. Name of Medication: sleep medication  2. How are you currently taking this medication (dosage and times per day)?   3. Are you having a reaction (difficulty breathing--STAT)? no  4. What is your medication issue? Patient calling to request something to help her sleep. She says what she is taking over the counter is not working and she has a sleep study tonight at [facility]. She requests the prescription be sent to [facility] so she can pick it up before her sleep study.

## 2023-01-18 NOTE — Procedures (Signed)
   Patient Name: Alice Stewart, B[place] Date: 01/17/2023 Gender: Female D.O.B: 07/25/1956 Age (years): 43 Referring Provider: Fransico Him MD, ABSM Height (inches): 64 Interpreting Physician: Fransico Him MD, ABSM Weight (lbs): 240 RPSGT: Baxter Flattery BMI: 41 MRN: [id number] Neck Size: 15.00  CLINICAL INFORMATION The patient is referred for a CPAP titration to treat sleep apnea.  SLEEP STUDY TECHNIQUE As per the AASM Manual for the Scoring of Sleep and Associated Events v2.3 (April 2016) with a hypopnea requiring 4% desaturations.  The channels recorded and monitored were frontal, central and occipital EEG, electrooculogram (EOG), submentalis EMG (chin), nasal and oral airflow, thoracic and abdominal wall motion, anterior tibialis EMG, snore microphone, electrocardiogram, and pulse oximetry. Continuous positive airway pressure (CPAP) was initiated at the beginning of the study and titrated to treat sleep-disordered breathing.  MEDICATIONS Medications self-administered by patient taken the night of the study : N/A  TECHNICIAN COMMENTS Comments added by technician: Patient had difficulty initiating sleep. Comments added by scorer: N/A  RESPIRATORY PARAMETERS Optimal PAP Pressure (cm):14  AHI at Optimal Pressure (/hr):5.8 Overall Minimal O2 (%):83.0  Supine % at Optimal Pressure (%):0 Minimal O2 at Optimal Pressure (%): 88.0   SLEEP ARCHITECTURE The study was initiated at 10:14:51 PM and ended at 4:53:16 AM.  Sleep onset time was 4.9 minutes and the sleep efficiency was 86.1%. The total sleep time was 343.1 minutes.  The patient spent 2.2% of the night in stage N1 sleep, 83.2% in stage N2 sleep, 0.0% in stage N3 and 14.6% in REM.Stage REM latency was 46.0 minutes  Wake after sleep onset was 50.5. Alpha intrusion was absent. Supine sleep was 66.02%.  CARDIAC DATA The 2 lead EKG demonstrated sinus rhythm, pacemaker generated. The mean heart rate was 70.0 beats per minute.  Other EKG findings include: None.  LEG MOVEMENT DATA The total Periodic Limb Movements of Sleep (PLMS) were 0. The PLMS index was 0.0. A PLMS index of <15 is considered normal in adults.  IMPRESSIONS - The optimal PAP pressure was 14 cm of water. - Moderate oxygen desaturations were observed during this titration (min O2 = 83.0%). - No snoring was audible during this study. - No cardiac abnormalities were observed during this study. - Clinically significant periodic limb movements were not noted during this study. Arousals associated with PLMs were rare.  DIAGNOSIS - Obstructive Sleep Apnea (G47.33)  RECOMMENDATIONS - Trial of ResMed CPAP therapy on 15 cm H2O with a Wide size Resmed Nasal [place] FX mask and heated humidification. - Avoid alcohol, sedatives and other CNS depressants that may worsen sleep apnea and disrupt normal sleep architecture. - Sleep hygiene should be reviewed to assess factors that may improve sleep quality. - Weight management and regular exercise should be initiated or continued. - Return to Sleep Center for re-evaluation after 4 weeks of therapy  [Electronically signed] 01/18/2023 03:49 PM  Fransico Him MD, ABSM Diplomate, American Board of Sleep Medicine

## 2023-01-18 NOTE — Procedures (Deleted)
    NAME: Alice Stewart DATE OF BIRTH:  [birth year] MEDICAL RECORD NUMBER [id number]  LOCATION: [facility]  PHYSICIAN: Hannahmarie Asberry  DATE OF STUDY: 01/17/2023  SLEEP STUDY TYPE: Out of Center Sleep Test                REFERRING PHYSICIAN: Sueanne Margarita, MD  INDICATION FOR STUDY: ***  EPWORTH SLEEPINESS SCORE:   HEIGHT: 5\' 4"  (162.6 cm)  WEIGHT: 240 lb (108.9 kg)    Body mass index is 41.2 kg/m.  NECK SIZE: 15 in.  MEDICATIONS: ***  IMPRESSION:  ***    RECOMMENDATION:  ***   Hye Trawick Diplomate, American Board of Sleep Medicine  ELECTRONICALLY SIGNED ON:  01/18/2023, 3:44 PM [place] PH: [phone]   FX: [phone] [place]

## 2023-01-27 ENCOUNTER — Other Ambulatory Visit: Payer: Self-pay | Admitting: Physician Assistant

## 2023-01-28 ENCOUNTER — Telehealth: Payer: Self-pay | Admitting: *Deleted

## 2023-01-28 ENCOUNTER — Ambulatory Visit: Payer: 59 | Admitting: Obstetrics and Gynecology

## 2023-01-28 NOTE — Telephone Encounter (Signed)
The patient has been notified of the result and verbalized understanding.  All questions (if any) were answered. Alice Stewart, [place] 01/28/2023 2:36 PM    Upon patient request DME selection is [facility]. Patient understands he will be contacted by [facility] to set up his cpap. Patient understands to call if [facility] does not contact him with new setup in a timely manner. Patient understands they will be called once confirmation has been received from [facility]/ that they have received their new machine to schedule 10 week follow up appointment.   [facility] notified of new cpap order  Please add to airview Patient was grateful for the call and thanked me.

## 2023-01-28 NOTE — Telephone Encounter (Signed)
-----   Message from Lauralee Evener, [place] sent at 01/20/2023  1:58 PM EST -----  ----- Message ----- From: Sueanne Margarita, MD Sent: 01/18/2023   3:51 PM EST To: Cv Div Sleep Studies  Please let patient know that they had a successful PAP titration and let DME know that orders are in EPIC.  Please set up 6 week OV with me.

## 2023-01-31 ENCOUNTER — Ambulatory Visit (INDEPENDENT_AMBULATORY_CARE_PROVIDER_SITE_OTHER): Payer: 59 | Admitting: Medical-Surgical

## 2023-01-31 DIAGNOSIS — Z1211 Encounter for screening for malignant neoplasm of colon: Secondary | ICD-10-CM

## 2023-01-31 DIAGNOSIS — Z0000 Encounter for general adult medical examination without abnormal findings: Secondary | ICD-10-CM | POA: Diagnosis not present

## 2023-01-31 NOTE — Progress Notes (Signed)
MEDICARE ANNUAL WELLNESS VISIT  01/31/2023  Telephone Visit Disclaimer This Medicare AWV was conducted by telephone due to national recommendations for restrictions regarding the COVID-19 Pandemic (e.g. social distancing).  I verified, using two identifiers, that I am speaking with Alice Stewart or their authorized healthcare agent. I discussed the limitations, risks, security, and privacy concerns of performing an evaluation and management service by telephone and the potential availability of an in-person appointment in the future. The patient expressed understanding and agreed to proceed.  Location of Patient: Home Location of Provider (nurse):  In the office.  Subjective:    Alice Stewart is a 67 y.o. female patient of Samuel Bouche, NP who had a Medicare Annual Wellness Visit today via telephone. Alice Stewart is Legally disabled and lives alone. she has 3 children. she reports that she is socially active and does interact with friends/family regularly. she is minimally physically active and enjoys watching television, cooking, baking and sewing.  Patient Care Team: Samuel Bouche, NP as PCP - General (Nurse Practitioner) Lelon Perla, MD as PCP - Cardiology (Cardiology) Constance Haw, MD as PCP - Electrophysiology (Cardiology) Himmelrich, Bryson Ha, RD (Inactive) as Dietitian     01/31/2023    2:52 PM 01/17/2023    8:22 PM 11/04/2022    3:00 PM 01/18/2022   10:38 AM 08/25/2021   11:26 AM 06/13/2021    2:38 PM  Advanced Directives  Does Patient Have a Medical Advance Directive? Yes Yes Yes Yes Yes Yes  Type of Advance Directive Living will Living will Living will Living will [place] will  Does patient want to make changes to medical advance directive?  No - Patient declined No - Patient declined No - Patient declined    Copy of [place] in Chart?     No - copy requested   Would patient like information on creating a medical  advance directive?   No - Patient declined       Hospital Utilization Over the Past 12 Months: # of hospitalizations or ER visits: 1 # of surgeries: 1  Review of Systems    Patient reports that her overall health is unchanged compared to last year.  History obtained from chart review and the patient  Patient Reported Readings (BP, Pulse, CBG, Weight, etc) none  Pain Assessment Pain : No/denies pain Pain Score: 0-No pain     Current Medications & Allergies (verified) Allergies as of 01/31/2023       Reactions   Bystolic [nebivolol Hcl] Other (See Comments)   Nausea.    Coreg [carvedilol] Swelling   Estradiol Itching   Per patient, medication caused her to have a severe yeast reaction.    Amlodipine-atorvastatin Other (See Comments)   salty taste in mouth, stomach burning, H/A   Chlorthalidone Nausea Only   Headache   Diltiazem Hcl Other (See Comments)   Dizzy   Lisinopril    backache   Metoprolol Tartrate Other (See Comments)   nausea,HA, dizziness   Simvastatin Other (See Comments)   Myalgia        Medication List        Accurate as of January 31, 2023  3:15 PM. If you have any questions, ask your nurse or doctor.          aspirin EC 81 MG tablet Take 81 mg by mouth daily.   BIOFLEX PO Take 2 tablets by mouth daily.   Biotin 10000 MCG Tabs Take  10,000 mcg by mouth daily.   Calcium Carb-Cholecalciferol 600-20 MG-MCG Tabs Take 1 tablet by mouth daily.   clindamycin 2 % vaginal cream Commonly known as: Cleocin Apply one applicator full at bedtime for 1 week then twice a week.   clobetasol 0.05 % external solution Commonly known as: TEMOVATE Apply topically 2 (two) times daily.   COLLAGEN PO Take 3 capsules by mouth daily.   cyanocobalamin 1000 MCG tablet Commonly known as: VITAMIN B12 Take 1,000 mcg by mouth daily.   ferrous sulfate 325 (65 FE) MG tablet Take 325 mg by mouth every other day.   flecainide 100 MG tablet Commonly known  as: TAMBOCOR Take 1 tablet (100 mg total) by mouth 2 (two) times daily.   Fluocinolone Acetonide Scalp 0.01 % Oil Commonly known as: Derma-Smoothe/FS Scalp Apply 1 application topically daily.   furosemide 20 MG tablet Commonly known as: LASIX Take 1 tablet (20 mg total) by mouth daily.   GNP CAL MAG ZINC +D3 PO Take 3 tablets by mouth daily.   labetalol 300 MG tablet Commonly known as: NORMODYNE Take 2 tablets (600 mg total) by mouth 2 (two) times daily.   losartan 100 MG tablet Commonly known as: COZAAR Take 1 tablet (100 mg total) by mouth daily.   misoprostol 200 MCG tablet Commonly known as: CYTOTEC Take 1 tablet (200 mcg total) by mouth 4 (four) times daily.   multivitamin tablet Take 1 tablet by mouth daily.   naproxen 500 MG tablet Commonly known as: NAPROSYN Take 1 tablet (500 mg total) by mouth 2 (two) times daily as needed.   NON FORMULARY Take by mouth daily. 2 tablespoons olive oil   OVER THE COUNTER MEDICATION Take 1,000-2,000 mg by mouth daily. [place] qu[place]   predniSONE 50 MG tablet Commonly known as: DELTASONE One tab PO daily for 5 days.   PROBIOTIC ACIDOPHILUS PO Take by mouth.   Red Clover Leaf Extract 500 MG Tabs Take 500 mg by mouth daily.   vitamin C 1000 MG tablet Take 1,000 mg by mouth daily.   vitamin E 180 MG (400 UNITS) capsule Take 400 Units by mouth daily.        History (reviewed): Past Medical History:  Diagnosis Date   Anemia    Back pain    Benign positional vertigo    Cardiomyopathy ([place])    Carpal tunnel syndrome    Goiter    nontoxid mutinodular   Hypertension    Hyperthyroidism    Insulin resistance syndrome    Menopausal disorder    Menorrhagia    Migraine    Muscle spasm    trapezius   Neck pain, acute    Obesity    Seborrheic dermatitis    Thyroid disorder    Past Surgical History:  Procedure Laterality Date   CHOLECYSTECTOMY     [place]   DILATION AND CURETTAGE OF UTERUS  03/23/2008    ENDOMETRIAL ABLATION  03/23/2008   GREAT TOE ARTHRODESIS, METATARSALPHALANGEAL JOINT  05/23/2006   Rt foot   HEEL SPUR SURGERY  10/23/1986   Lt foot   ICD GENERATOR CHANGEOUT N/A 01/18/2022   Procedure: ICD GENERATOR CHANGEOUT;  Surgeon: Constance Haw, MD;  Location: [facility];  Service: Cardiovascular;  Laterality: N/A;   TUBAL LIGATION  10/23/1989   Family History  Problem Relation Age of Onset   Other Mother        MS   Hypertension Father    Hypothyroidism Sister  Hypertension Sister    Diabetes Other    Social History   Socioeconomic History   Marital status: Divorced    Spouse name: Not on file   Number of children: 3   Years of education: 14   Highest education level: Not on file  Occupational History   Occupation: unemployed    Employer: [employer]   Occupation: legally disabled  Tobacco Use   Smoking status: Former    Types: Cigarettes    Quit date: 09/22/2004    Years since quitting: 18.3   Smokeless tobacco: Never  Vaping Use   Vaping Use: Never used  Substance and Sexual Activity   Alcohol use: Not Currently    Alcohol/week: 1.0 standard drink of alcohol    Types: 1 Standard drinks or equivalent per week    Comment: 1 weekly   Drug use: No   Sexual activity: Yes    Birth control/protection: Post-menopausal  Other Topics Concern   Not on file  Social History Narrative   Lives alone. She has three children. She enjoys watching television, cooking, baking and sewing.   Social Determinants of Health   Financial Resource Strain: Low Risk  (01/27/2023)   Overall Financial Resource Strain (CARDIA)    Difficulty of Paying Living Expenses: Not hard at all  Food Insecurity: Food Insecurity Present (01/27/2023)   Hunger Vital Sign    Worried About Running Out of Food in the Last Year: Sometimes true    Ran Out of Food in the Last Year: Never true  Transportation Needs: Unmet Transportation Needs (01/27/2023)   PRAPARE - Radiographer, therapeutic (Medical): Yes    Lack of Transportation (Non-Medical): Yes  Physical Activity: Insufficiently Active (01/27/2023)   Exercise Vital Sign    Days of Exercise per Week: 2 days    Minutes of Exercise per Session: 60 min  Stress: No Stress Concern Present (01/27/2023)   [place]    Feeling of Stress : Not at all  Social Connections: Socially Isolated (01/31/2023)   Social Connection and Isolation Panel [NHANES]    Frequency of Communication with Friends and Family: More than three times a week    Frequency of Social Gatherings with Friends and Family: Three times a week    Attends Religious Services: Never    Active Member of Clubs or Organizations: No    Attends Archivist Meetings: Never    Marital Status: Divorced    Activities of Daily Living    01/27/2023   12:07 PM  In your present state of health, do you have any difficulty performing the following activities:  Hearing? 0  Vision? 0  Difficulty concentrating or making decisions? 1  Walking or climbing stairs? 1  Dressing or bathing? 0  Doing errands, shopping? 0  Preparing Food and eating ? N  Using the Toilet? N  In the past six months, have you accidently leaked urine? N  Do you have problems with loss of bowel control? N  Managing your Medications? N  Managing your Finances? N  Housekeeping or managing your Housekeeping? N    Patient Education/ Literacy How often do you need to have someone help you when you read instructions, pamphlets, or other written materials from your doctor or pharmacy?: 1 - Never What is the last grade level you completed in school?: 1.5 years of college  Exercise Current Exercise Habits: Home exercise routine, Type of exercise: strength training/weights;stretching, Time (  Minutes): 60, Frequency (Times/Week): 2, Weekly Exercise (Minutes/Week): 120, Intensity: Moderate, Exercise limited by: None  identified  Diet Patient reports consuming  2-3  meals a day and 2 snack(s) a day Patient reports that her primary diet is: Regular Patient reports that she does have regular access to food.   Depression Screen    01/31/2023    3:00 PM 01/31/2023    2:59 PM 04/11/2022    3:08 PM 02/19/2022    3:25 PM 11/26/2021    4:30 PM 08/25/2021   11:25 AM 06/13/2021    2:39 PM  PHQ 2/9 Scores  PHQ - 2 Score 0 0 0 0 0 0 0  PHQ- 9 Score     3  3     Fall Risk    01/31/2023    2:59 PM 01/27/2023   12:07 PM 04/11/2022    3:08 PM 02/19/2022    3:25 PM 08/25/2021   11:27 AM  Fall Risk   Falls in the past year? 0 0 0 0 0  Number falls in past yr: 0 0 0 0 0  Injury with Fall? 0 0 0 0 0  Risk for fall due to : No Fall Risks  No Fall Risks No Fall Risks Impaired vision  Follow up Falls evaluation completed  Falls evaluation completed Falls evaluation completed Falls prevention discussed     Objective:  Alice Stewart seemed alert and oriented and she participated appropriately during our telephone visit.  Blood Pressure Weight BMI  BP Readings from Last 3 Encounters:  12/04/22 132/64  10/14/22 (!) 154/79  09/23/22 (!) 159/80   Wt Readings from Last 3 Encounters:  01/17/23 240 lb (108.9 kg)  12/04/22 248 lb 0.6 oz (112.5 kg)  10/14/22 248 lb (112.5 kg)   BMI Readings from Last 1 Encounters:  01/17/23 41.20 kg/m    *Unable to obtain current vital signs, weight, and BMI due to telephone visit type  Hearing/Vision  Ani did not seem to have difficulty with hearing/understanding during the telephone conversation Reports that she has had a formal eye exam by an eye care professional within the past year Reports that she has not had a formal hearing evaluation within the past year *Unable to fully assess hearing and vision during telephone visit type  Cognitive Function:    01/31/2023    3:01 PM 08/25/2021   11:30 AM  6CIT Screen  What Year? 0 points 0 points  What month? 0 points 0 points   What time? 0 points 0 points  Count back from 20 0 points 0 points  Months in reverse 0 points 0 points  Repeat phrase 0 points 0 points  Total Score 0 points 0 points   (Normal:0-7, Significant for Dysfunction: >8)  Normal Cognitive Function Screening: Yes   Immunization & Health Maintenance Record Immunization History  Administered Date(s) Administered   Influenza Split 11/26/2011   Influenza Whole 09/15/2013, 09/19/2014, 10/20/2015   Influenza, Seasonal, Injecte, Preservative Fre 09/22/2020   Influenza,inj,Quad PF,6+ Mos 09/22/2018   Influenza,inj,quad, With Preservative 08/14/2016   Influenza-Unspecified 09/16/2013, 09/19/2014, 10/20/2015, 10/01/2019   Moderna Sars-Covid-2 Vaccination 02/25/2020, 03/24/2020, 11/23/2020   Pneumococcal Polysaccharide-23 01/26/2021   Tdap 08/19/2013    Health Maintenance  Topic Date Due   COVID-19 Vaccine (4 - 2023-24 season) 02/16/2023 (Originally 08/23/2022)   INFLUENZA VACCINE  03/23/2023 (Originally 07/23/2022)   Zoster Vaccines- Shingrix (1 of 2) 05/01/2023 (Originally 09/23/1975)   MAMMOGRAM  02/01/2024 (Originally 09/27/2022)   COLONOSCOPY (Pts 45-95yr Insurance  coverage will need to be confirmed)  02/01/2024 (Originally 12/23/2022)   DTaP/Tdap/Td (2 - Td or Tdap) 08/20/2023   Medicare Annual Wellness (AWV)  02/01/2024   PAP SMEAR-Modifier  04/11/2025   Hepatitis C Screening  Completed   HIV Screening  Completed   HPV VACCINES  Aged Out       Assessment  This is a routine wellness examination for Alice Stewart.  Health Maintenance: Due or Overdue There are no preventive care reminders to display for this patient.   Alice Stewart does not need a referral for Community Assistance: Care Management:   no Social Work:    no Prescription Assistance:  no Nutrition/Diabetes Education:  no   Plan:  Personalized Goals  Goals Addressed               This Visit's Progress     Patient Stated (pt-stated)        Patient  stated that she would like to loose weight        Personalized Health Maintenance & Screening Recommendations  Influenza vaccine Screening mammography Colorectal cancer screening Shingrix vaccine  Lung Cancer Screening Recommended: no (Low Dose CT Chest recommended if Age 60-80 years, 30 pack-year currently smoking OR have quit w/in past 15 years) Hepatitis C Screening recommended: no HIV Screening recommended: no  Advanced Directives: Written information was not prepared per patient's request.  Referrals & Orders Orders Placed This Encounter  Procedures   Ambulatory referral to Gastroenterology (for Colonoscopy)    Follow-up Plan Follow-up with Samuel Bouche, NP as planned Schedule Shingrix vaccine at the pharmacy. Medicare wellness visit in one year. Patient will access AVS on my chart.   I have personally reviewed and noted the following in the patient's chart:   Medical and social history Use of alcohol, tobacco or illicit drugs  Current medications and supplements Functional ability and status Nutritional status Physical activity Advanced directives List of other physicians Hospitalizations, surgeries, and ER visits in previous 12 months Vitals Screenings to include cognitive, depression, and falls Referrals and appointments  In addition, I have reviewed and discussed with Alice Stewart certain preventive protocols, quality metrics, and best practice recommendations. A written personalized care plan for preventive services as well as general preventive health recommendations is available and can be mailed to the patient at her request.      Tinnie Gens, RN BSN  01/31/2023

## 2023-01-31 NOTE — Patient Instructions (Addendum)
[facility] Maintenance Summary and Written Plan of Care  [name] ,  Thank you for allowing me to perform your Medicare Annual Wellness Visit and for your ongoing commitment to your health.   Health Maintenance & Immunization History Health Maintenance  Topic Date Due   COVID-19 Vaccine (4 - 2023-24 season) 02/16/2023 (Originally 08/23/2022)   INFLUENZA VACCINE  03/23/2023 (Originally 07/23/2022)   Zoster Vaccines- Shingrix (1 of 2) 05/01/2023 (Originally 09/23/1975)   MAMMOGRAM  02/01/2024 (Originally 09/27/2022)   COLONOSCOPY (Pts 45-11yr Insurance coverage will need to be confirmed)  02/01/2024 (Originally 12/23/2022)   DTaP/Tdap/Td (2 - Td or Tdap) 08/20/2023   Medicare Annual Wellness (AWV)  02/01/2024   PAP SMEAR-Modifier  04/11/2025   Hepatitis C Screening  Completed   HIV Screening  Completed   HPV VACCINES  Aged Out   Immunization History  Administered Date(s) Administered   Influenza Split 11/26/2011   Influenza Whole 09/15/2013, 09/19/2014, 10/20/2015   Influenza, Seasonal, Injecte, Preservative Fre 09/22/2020   Influenza,inj,Quad PF,6+ Mos 09/22/2018   Influenza,inj,quad, With Preservative 08/14/2016   Influenza-Unspecified 09/16/2013, 09/19/2014, 10/20/2015, 10/01/2019   Moderna Sars-Covid-2 Vaccination 02/25/2020, 03/24/2020, 11/23/2020   Pneumococcal Polysaccharide-23 01/26/2021   Tdap 08/19/2013    These are the patient goals that we discussed:  Goals Addressed               This Visit's Progress     Patient Stated (pt-stated)        Patient stated that she would like to loose weight          This is a list of Health Maintenance Items that are overdue or due now: Influenza vaccine Screening mammography Colorectal cancer screening Shingrix vaccine    Orders/Referrals Placed Today: Orders Placed This Encounter  Procedures   Ambulatory referral to Gastroenterology (for Colonoscopy)    Referral Priority:   Routine     Referral Type:   Consultation    Referral Reason:   Specialty Services Required    Number of Visits Requested:   1   (Contact our referral department at [phone] you have not spoken with someone about your referral appointment within the next 5 days)    Follow-up Plan Follow-up with JSamuel Bouche NP as planned Schedule Shingrix vaccine at the pharmacy. Medicare wellness visit in one year. Patient will access AVS on my chart.      Health Maintenance, Female Adopting a healthy lifestyle and getting preventive care are important in promoting health and wellness. Ask your health care provider about: The right schedule for you to have regular tests and exams. Things you can do on your own to prevent diseases and keep yourself healthy. What should I know about diet, weight, and exercise? Eat a healthy diet  Eat a diet that includes plenty of vegetables, fruits, low-fat dairy products, and lean protein. Do not eat a lot of foods that are high in solid fats, added sugars, or sodium. Maintain a healthy weight Body mass index (BMI) is used to identify weight problems. It estimates body fat based on height and weight. Your health care provider can help determine your BMI and help you achieve or maintain a healthy weight. Get regular exercise Get regular exercise. This is one of the most important things you can do for your health. Most adults should: Exercise for at least 150 minutes each week. The exercise should increase your heart rate and make you sweat (moderate-intensity exercise). Do strengthening exercises at least twice a  week. This is in addition to the moderate-intensity exercise. Spend less time sitting. Even light physical activity can be beneficial. Watch cholesterol and blood lipids Have your blood tested for lipids and cholesterol at 65 years of age, then have this test every 5 years. Have your cholesterol levels checked more often if: Your lipid or cholesterol levels  are high. You are older than 65 years of age. You are at high risk for heart disease. What should I know about cancer screening? Depending on your health history and family history, you may need to have cancer screening at various ages. This may include screening for: Breast cancer. Cervical cancer. Colorectal cancer. Skin cancer. Lung cancer. What should I know about heart disease, diabetes, and high blood pressure? Blood pressure and heart disease High blood pressure causes heart disease and increases the risk of stroke. This is more likely to develop in people who have high blood pressure readings or are overweight. Have your blood pressure checked: Every 3-5 years if you are 37-82 years of age. Every year if you are 20 years old or older. Diabetes Have regular diabetes screenings. This checks your fasting blood sugar level. Have the screening done: Once every three years after age 43 if you are at a normal weight and have a low risk for diabetes. More often and at a younger age if you are overweight or have a high risk for diabetes. What should I know about preventing infection? Hepatitis B If you have a higher risk for hepatitis B, you should be screened for this virus. Talk with your health care provider to find out if you are at risk for hepatitis B infection. Hepatitis C Testing is recommended for: Everyone born from [birth year] through 1963. Anyone with known risk factors for hepatitis C. Sexually transmitted infections (STIs) Get screened for STIs, including gonorrhea and chlamydia, if: You are sexually active and are younger than 65 years of age. You are older than 65 years of age and your health care provider tells you that you are at risk for this type of infection. Your sexual activity has changed since you were last screened, and you are at increased risk for chlamydia or gonorrhea. Ask your health care provider if you are at risk. Ask your health care provider about whether  you are at high risk for HIV. Your health care provider may recommend a prescription medicine to help prevent HIV infection. If you choose to take medicine to prevent HIV, you should first get tested for HIV. You should then be tested every 3 months for as long as you are taking the medicine. Pregnancy If you are about to stop having your period (premenopausal) and you may become pregnant, seek counseling before you get pregnant. Take 400 to 800 micrograms (mcg) of folic acid every day if you become pregnant. Ask for birth control (contraception) if you want to prevent pregnancy. Osteoporosis and menopause Osteoporosis is a disease in which the bones lose minerals and strength with aging. This can result in bone fractures. If you are 4 years old or older, or if you are at risk for osteoporosis and fractures, ask your health care provider if you should: Be screened for bone loss. Take a calcium or vitamin D supplement to lower your risk of fractures. Be given hormone replacement therapy (HRT) to treat symptoms of menopause. Follow these instructions at home: Alcohol use Do not drink alcohol if: Your health care provider tells you not to drink. You are pregnant, may be  pregnant, or are planning to become pregnant. If you drink alcohol: Limit how much you have to: 0-1 drink a day. Know how much alcohol is in your drink. In the [place] one drink equals one 12 oz bottle of beer (355 mL), one 5 oz glass of wine (148 mL), or one 1 oz glass of hard liquor (44 mL). Lifestyle Do not use any products that contain nicotine or tobacco. These products include cigarettes, chewing tobacco, and vaping devices, such as e-cigarettes. If you need help quitting, ask your health care provider. Do not use street drugs. Do not share needles. Ask your health care provider for help if you need support or information about quitting drugs. General instructions Schedule regular health, dental, and eye exams. Stay  current with your vaccines. Tell your health care provider if: You often feel depressed. You have ever been abused or do not feel safe at home. Summary Adopting a healthy lifestyle and getting preventive care are important in promoting health and wellness. Follow your health care provider's instructions about healthy diet, exercising, and getting tested or screened for diseases. Follow your health care provider's instructions on monitoring your cholesterol and blood pressure. This information is not intended to replace advice given to you by your health care provider. Make sure you discuss any questions you have with your health care provider. Document Revised: 04/30/2021 Document Reviewed: 04/30/2021 Elsevier Patient Education  [place].

## 2023-02-03 NOTE — Progress Notes (Signed)
Remote ICD transmission.   

## 2023-02-04 ENCOUNTER — Telehealth: Payer: Self-pay | Admitting: Medical-Surgical

## 2023-02-04 NOTE — Telephone Encounter (Signed)
Pt called in this afternoon to put in a request for a Colorguard kit. She has decided to do this rather than have her colonoscopy.

## 2023-02-05 ENCOUNTER — Telehealth: Payer: Self-pay | Admitting: Cardiology

## 2023-02-05 NOTE — Telephone Encounter (Signed)
Spoke with patient of Dr. Stanford Breed. She said she was not given much information about her sleep study results and would like to discuss with Dr. Stanford Breed. She is not sure how to use the machine, etc. Explained that DME company would provide this education.   She also expressed that she cannot pay anything for the CPAP machine, would only be able to obtain/use if insurance covers completely   She would still like a visit - scheduled for 3/7 at 2:30pm in [place]  Routed to [employer] - in case a [facility] appointment opens

## 2023-02-05 NOTE — Telephone Encounter (Signed)
Patient states she has her sleep apnea test in January at [facility], but she has not spoken with Dr. Stanford Breed in regards to her results. She states the [employer] has contacted her regarding the sleep equipment, but she would like to discuss this with Dr. Stanford Breed prior to proceeding with the machine. She would like to know if she can be worked in for an appointment in [place] to review everything with Dr. Stanford Breed.

## 2023-02-05 NOTE — Telephone Encounter (Signed)
Sent patient Mychart message that will need to schedule a visit with Samuel Bouche, NP once Colonoscopy results received from [place] , [place] to discuss Cologuard testing instead,

## 2023-02-06 NOTE — Telephone Encounter (Signed)
Spoke with pt, she will be seen in [facility] 02/10/23

## 2023-02-07 NOTE — Progress Notes (Unsigned)
HPI: FU CM.  Previously followed by Marshall Cork, MD in [place]. She has a history of echocardiogram in 2014 showed ejection fraction 25 to 30% with left bundle branch block.  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  She had CRT-D in 2015.  CTA in July 2021 showed minimal nonobstructive coronary disease with calcium score 34.  Echocardiogram repeated May 2021 showed ejection fraction 55 to 60%.  Her PVCs are now treated with flecainide. Patient previously moved back to this area from [place] state.  Follow-up echocardiogram here September 2022 showed ejection fraction 45 to 50%, moderate left ventricular hypertrophy, grade 1 diastolic dysfunction, mild mitral regurgitation. Patient had ICD generator change January 2023.  Note by report patient did not tolerate carvedilol, Toprol, Entresto, spironolactone, Cardura and recently discontinued labetalol.  She is intolerant to statins.  Sleep study 12/23 with moderate OSA and O2 sats as low as 76. Since last seen she has some dyspnea on exertion but no orthopnea, PND, increased pedal edema, chest pain or syncope.  She had her sleep study which does show sleep apnea.  Current Outpatient Medications  Medication Sig Dispense Refill   Ascorbic Acid (VITAMIN C) 1000 MG tablet Take 1,000 mg by mouth daily.       aspirin 81 MG EC tablet Take 81 mg by mouth daily.     Bioflavonoid Products (BIOFLEX PO) Take 2 tablets by mouth daily.     Biotin 10000 MCG TABS Take 10,000 mcg by mouth daily.     Calcium Carb-Cholecalciferol 600-20 MG-MCG TABS Take 1 tablet by mouth daily.     clindamycin (CLEOCIN) 2 % vaginal cream Apply one applicator full at bedtime for 1 week then twice a week. 80 g 3   clobetasol (TEMOVATE) 0.05 % external solution Apply topically 2 (two) times daily.     COLLAGEN PO Take 3 capsules by mouth daily.     ferrous sulfate 325 (65 FE) MG tablet Take 325 mg by mouth every other day.     flecainide  (TAMBOCOR) 100 MG tablet Take 1 tablet (100 mg total) by mouth 2 (two) times daily. 180 tablet 3   Fluocinolone Acetonide Scalp (DERMA-SMOOTHE/FS SCALP) 0.01 % OIL Apply 1 application topically daily. 118.28 mL 0   furosemide (LASIX) 20 MG tablet Take 1 tablet (20 mg total) by mouth daily. 90 tablet 3   Lactobacillus (PROBIOTIC ACIDOPHILUS PO) Take by mouth.     losartan (COZAAR) 100 MG tablet Take 1 tablet (100 mg total) by mouth daily. 90 tablet 1   Multiple Minerals-Vitamins (GNP CAL MAG ZINC +D3 PO) Take 3 tablets by mouth daily.     Multiple Vitamin (MULTIVITAMIN) tablet Take 1 tablet by mouth daily.       naproxen (NAPROSYN) 500 MG tablet Take 1 tablet (500 mg total) by mouth 2 (two) times daily as needed. 60 tablet 3   NON FORMULARY Take by mouth daily. 2 tablespoons olive oil     OVER THE COUNTER MEDICATION Take 1,000-2,000 mg by mouth daily. [place] qu[place]     Red Clover Leaf Extract 500 MG TABS Take 500 mg by mouth daily.     vitamin B-12 (CYANOCOBALAMIN) 1000 MCG tablet Take 1,000 mcg by mouth daily.     vitamin E 180 MG (400 UNITS) capsule Take 400 Units by mouth daily.     No current facility-administered medications for this visit.     Past Medical History:  Diagnosis Date  Anemia    Back pain    Benign positional vertigo    Cardiomyopathy ([place])    Carpal tunnel syndrome    Goiter    nontoxid mutinodular   Hypertension    Hyperthyroidism    Insulin resistance syndrome    Menopausal disorder    Menorrhagia    Migraine    Muscle spasm    trapezius   Neck pain, acute    Obesity    Seborrheic dermatitis    Thyroid disorder     Past Surgical History:  Procedure Laterality Date   CHOLECYSTECTOMY     [place]   DILATION AND CURETTAGE OF UTERUS  03/23/2008   ENDOMETRIAL ABLATION  03/23/2008   GREAT TOE ARTHRODESIS, METATARSALPHALANGEAL JOINT  05/23/2006   Rt foot   HEEL SPUR SURGERY  10/23/1986   Lt foot   ICD GENERATOR CHANGEOUT N/A 01/18/2022   Procedure: ICD  GENERATOR CHANGEOUT;  Surgeon: Constance Haw, MD;  Location: [facility];  Service: Cardiovascular;  Laterality: N/A;   TUBAL LIGATION  10/23/1989    Social History   Socioeconomic History   Marital status: Divorced    Spouse name: Not on file   Number of children: 3   Years of education: 14   Highest education level: Not on file  Occupational History   Occupation: unemployed    Employer: [employer]   Occupation: legally disabled  Tobacco Use   Smoking status: Former    Types: Cigarettes    Quit date: 09/22/2004    Years since quitting: 18.3   Smokeless tobacco: Never  Vaping Use   Vaping Use: Never used  Substance and Sexual Activity   Alcohol use: Not Currently    Alcohol/week: 1.0 standard drink of alcohol    Types: 1 Standard drinks or equivalent per week    Comment: 1 weekly   Drug use: No   Sexual activity: Yes    Birth control/protection: Post-menopausal  Other Topics Concern   Not on file  Social History Narrative   Lives alone. She has three children. She enjoys watching television, cooking, baking and sewing.   Social Determinants of Health   Financial Resource Strain: Low Risk  (01/27/2023)   Overall Financial Resource Strain (CARDIA)    Difficulty of Paying Living Expenses: Not hard at all  Food Insecurity: Food Insecurity Present (01/27/2023)   Hunger Vital Sign    Worried About Running Out of Food in the Last Year: Sometimes true    Ran Out of Food in the Last Year: Never true  Transportation Needs: Unmet Transportation Needs (01/27/2023)   PRAPARE - Hydrologist (Medical): Yes    Lack of Transportation (Non-Medical): Yes  Physical Activity: Insufficiently Active (01/27/2023)   Exercise Vital Sign    Days of Exercise per Week: 2 days    Minutes of Exercise per Session: 60 min  Stress: No Stress Concern Present (01/27/2023)   [place]    Feeling of  Stress : Not at all  Social Connections: Socially Isolated (01/31/2023)   Social Connection and Isolation Panel [NHANES]    Frequency of Communication with Friends and Family: More than three times a week    Frequency of Social Gatherings with Friends and Family: Three times a week    Attends Religious Services: Never    Active Member of Clubs or Organizations: No    Attends Archivist Meetings: Never    Marital  Status: Divorced  Human resources officer Violence: Not At Risk (01/31/2023)   Humiliation, Afraid, Rape, and Kick questionnaire    Fear of Current or Ex-Partner: No    Emotionally Abused: No    Physically Abused: No    Sexually Abused: No    Family History  Problem Relation Age of Onset   Other Mother        MS   Hypertension Father    Hypothyroidism Sister    Hypertension Sister    Diabetes Other     ROS: no fevers or chills, productive cough, hemoptysis, dysphasia, odynophagia, melena, hematochezia, dysuria, hematuria, rash, seizure activity, orthopnea, PND, pedal edema, claudication. Remaining systems are negative.  Physical Exam: Well-developed obesein no acute distress.  Skin is warm and dry.  HEENT is normal.  Neck is supple.  Chest is clear to auscultation with normal expansion.  Cardiovascular exam is regular rate and rhythm.  Abdominal exam nontender or distended. No masses palpated. Extremities show no edema. neuro grossly intact   A/P  1 history of nonischemic cardiomyopathy-LV function is mildly reduced on her most recent echocardiogram.  Continue ARB.  She has not tolerated lisinopril, entresto or  beta-blockers previously.   2 history of elevated calcium score-intolerant to statins. Continue aspirin.   3 hypertension-blood pressure elevated; multiple medication intolerances including all beta-blockers, Entresto, amlodipine, diltiazem, clonidine, Cardura.  I will try hydralazine 25 mg twice daily to see if she tolerates.  Will refer to hypertension  clinic to see if there are other options.  I explained options are becoming limited due to intolerances.   4 hyperlipidemia-patient is intolerant to statins.   5 history of PVCs-continue flecainide.   6 CRT-D-per Dr. Curt Bears.   7 OSA-study demonstrates sleep apnea.  CPAP is to be initiated.  Per Dr. Radford Pax.  Kirk Ruths, MD

## 2023-02-10 ENCOUNTER — Ambulatory Visit (INDEPENDENT_AMBULATORY_CARE_PROVIDER_SITE_OTHER): Payer: 59 | Admitting: Cardiology

## 2023-02-10 ENCOUNTER — Encounter: Payer: Self-pay | Admitting: Cardiology

## 2023-02-10 ENCOUNTER — Encounter: Payer: Self-pay | Admitting: *Deleted

## 2023-02-10 VITALS — BP 191/96 | HR 72 | Ht 64.0 in | Wt 247.4 lb

## 2023-02-10 DIAGNOSIS — I10 Essential (primary) hypertension: Secondary | ICD-10-CM

## 2023-02-10 DIAGNOSIS — G4733 Obstructive sleep apnea (adult) (pediatric): Secondary | ICD-10-CM

## 2023-02-10 DIAGNOSIS — E785 Hyperlipidemia, unspecified: Secondary | ICD-10-CM | POA: Diagnosis not present

## 2023-02-10 DIAGNOSIS — I428 Other cardiomyopathies: Secondary | ICD-10-CM

## 2023-02-10 MED ORDER — HYDRALAZINE HCL 25 MG PO TABS: 25.0000 mg | ORAL_TABLET | Freq: Two times a day (BID) | ORAL | 3 refills | Status: DC

## 2023-02-10 NOTE — Patient Instructions (Signed)
Medication Instructions:   START HYDRALAZINE 25 MG TWICE DAILY  *If you need a refill on your cardiac medications before your next appointment, please call your pharmacy*   Follow-Up: At [facility], you and your health needs are our priority.  As part of our continuing mission to provide you with exceptional heart care, we have created designated Provider Care Teams.  These Care Teams include your primary Cardiologist (physician) and Advanced Practice Providers (APPs -  Physician Assistants and Nurse Practitioners) who all work together to provide you with the care you need, when you need it.  We recommend signing up for the patient portal called "MyChart".  Sign up information is provided on this After Visit Summary.  MyChart is used to connect with patients for Virtual Visits (Telemedicine).  Patients are able to view lab/test results, encounter notes, upcoming appointments, etc.  Non-urgent messages can be sent to your provider as well.   To learn more about what you can do with MyChart, go to [email or web address].    Your next appointment:   6 week(s)  Provider:   PHARMACIST AT [facility]  Your physician recommends that you schedule a follow-up appointment in: [place]

## 2023-02-11 ENCOUNTER — Telehealth: Payer: Self-pay | Admitting: Cardiology

## 2023-02-11 ENCOUNTER — Telehealth: Payer: Self-pay

## 2023-02-11 NOTE — Telephone Encounter (Signed)
Called [facility] in  [place] -  previous colonoscopy was requested on 02/05/23 - automated message states we have to give them 15 days to respond and we can not do inquiries - called patient and informed of this  She would like to see if anything can be done - she states all of her colonoscopies in past have been normal - can she go ahead and schedule the visit to discuss the cologuard without the previous results received ?

## 2023-02-11 NOTE — Telephone Encounter (Signed)
Patient states this was the first time she taken the medication Hydralazine .  She als states sh had some palpations as well.   She has not check blood pressure or heart rate. She does not have any equipment .   Informed patient hold medication until further notice. Will defer to Dr Stanford Breed  for further instructions Patient aware

## 2023-02-11 NOTE — Telephone Encounter (Signed)
Left message for patient with Dr Creshaw's recommendations.   

## 2023-02-11 NOTE — Telephone Encounter (Signed)
eror

## 2023-02-11 NOTE — Telephone Encounter (Signed)
Pt c/o medication issue:  1. Name of Medication: hydrALAZINE (APRESOLINE) 25 MG tablet   2. How are you currently taking this medication (dosage and times per day)? Took 1 tablet last night  3. Are you having a reaction (difficulty breathing--STAT)? Yes  4. What is your medication issue? Had symptoms of chest heaviness, SOB, dizziness, and being unable to sleep after taking one tablet. She reports all symptoms had stopped by the time she woke up this morning & denies having any symptoms now. Due to this reaction she has not taken any of the medication today. Please advise.

## 2023-02-12 NOTE — Telephone Encounter (Signed)
Patient scheduled for 02/25/23 @ 3pm

## 2023-02-17 ENCOUNTER — Telehealth: Payer: Self-pay | Admitting: *Deleted

## 2023-02-18 ENCOUNTER — Other Ambulatory Visit: Payer: Self-pay | Admitting: Cardiology

## 2023-02-25 ENCOUNTER — Ambulatory Visit (INDEPENDENT_AMBULATORY_CARE_PROVIDER_SITE_OTHER): Payer: 59 | Admitting: Medical-Surgical

## 2023-02-25 ENCOUNTER — Encounter: Payer: Self-pay | Admitting: Medical-Surgical

## 2023-02-25 VITALS — BP 169/78 | HR 60 | Resp 20 | Ht 64.0 in | Wt 246.4 lb

## 2023-02-25 DIAGNOSIS — J302 Other seasonal allergic rhinitis: Secondary | ICD-10-CM | POA: Diagnosis not present

## 2023-02-25 DIAGNOSIS — Z1211 Encounter for screening for malignant neoplasm of colon: Secondary | ICD-10-CM | POA: Diagnosis not present

## 2023-02-25 DIAGNOSIS — G4700 Insomnia, unspecified: Secondary | ICD-10-CM | POA: Diagnosis not present

## 2023-02-25 DIAGNOSIS — I10 Essential (primary) hypertension: Secondary | ICD-10-CM | POA: Diagnosis not present

## 2023-02-25 DIAGNOSIS — G4733 Obstructive sleep apnea (adult) (pediatric): Secondary | ICD-10-CM

## 2023-02-25 DIAGNOSIS — R195 Other fecal abnormalities: Secondary | ICD-10-CM

## 2023-02-25 MED ORDER — GUAIFENESIN-CODEINE 100-10 MG/5ML PO SYRP: 5.0000 mL | ORAL_SOLUTION | Freq: Three times a day (TID) | ORAL | 0 refills | Status: DC | PRN

## 2023-02-25 MED ORDER — LOSARTAN POTASSIUM 100 MG PO TABS: 100.0000 mg | ORAL_TABLET | Freq: Every day | ORAL | 1 refills | Status: DC

## 2023-02-25 MED ORDER — FUROSEMIDE 20 MG PO TABS: 20.0000 mg | ORAL_TABLET | Freq: Every day | ORAL | 3 refills | Status: DC

## 2023-02-25 MED ORDER — AZELASTINE HCL 0.1 % NA SOLN: 2.0000 | Freq: Two times a day (BID) | NASAL | 1 refills | Status: DC

## 2023-02-25 NOTE — Progress Notes (Signed)
Established Patient Office Visit  Subjective   Patient ID: Alice Stewart, female   DOB: April 04, 1956 Age: 67 y.o. MRN: [id number]   Chief Complaint  Patient presents with   discuss cologuard    HPI Pleasant 67 year old female presenting today with updates about her overall health and chronic health conditions.  Sleep apnea: Got her CPAP and used it for the first time last night.  Notes that she did not like it and was unable to get any sleep with it.  Knows the importance of using it and plans to take the next couple of weeks to get used to it.  Upper respiratory symptoms: Reports that she has had a month of upper respiratory symptoms including sneezing, coughing, scratchy throat, hoarse voice, and a slight intermittent headache along the sides of her head.  She has had no fever, chills, sinus pressure, chest pain, or GI symptoms.  Has been using DayQuil to manage symptoms.  History of difficulty sleeping.  She has trouble falling asleep as well as staying asleep.  She took melatonin which was not helpful last night.  Hypertension: Is followed by cardiology.  Follows a low-sodium diet.  Admits to some palpitations.  Trying to get exercise in as tolerated.  Requesting refills on furosemide and losartan.  Has an appointment coming up with cardiology soon.  Blood pressure is significantly elevated on arrival.    Colon cancer screening: reports colonoscopy in [place] most recently, had another in [place] somewhere.  Notes that these were both normal.  She had a very difficult time with the preps on both of these and is interested in doing a Cologuard this time around.  Objective:    Vitals:   02/25/23 1506 02/25/23 1545  BP: (!) 174/80 (!) 169/78  Pulse: (!) 59 60  Resp: 20 20  Height: '5\' 4"'$  (1.626 m)   Weight: 246 lb 6.4 oz (111.8 kg)   SpO2: 97% 97%  BMI (Calculated): 42.27    Physical Exam Vitals reviewed.  Constitutional:      General: She is not in acute distress.    Appearance:  Normal appearance. She is obese. She is not ill-appearing.  HENT:     Head: Normocephalic and atraumatic.  Cardiovascular:     Rate and Rhythm: Normal rate and regular rhythm.     Pulses: Normal pulses.     Heart sounds: Normal heart sounds.  Pulmonary:     Effort: Pulmonary effort is normal. No respiratory distress.     Breath sounds: Normal breath sounds. No wheezing, rhonchi or rales.  Skin:    General: Skin is warm and dry.  Neurological:     Mental Status: She is alert and oriented to person, place, and time.  Psychiatric:        Mood and Affect: Mood normal.        Behavior: Behavior normal.        Thought Content: Thought content normal.        Judgment: Judgment normal.   No results found for this or any previous visit (from the past 24 hour(s)).     The 10-year ASCVD risk score (Arnett DK, et al., 2019) is: 12.9%   Values used to calculate the score:     Age: 34 years     Sex: Female     Is Non-Hispanic African American: No     Diabetic: No     Tobacco smoker: No     Systolic Blood Pressure: 123XX123 mmHg  Is BP treated: Yes     HDL Cholesterol: 49 mg/dL     Total Cholesterol: 225 mg/dL   Assessment & Plan:   1. Colon cancer screening Discussed options for colon cancer screening.  She does meet the criteria for using Cologuard so ordering today. - Cologuard  2. Seasonal allergies 1 month of upper respiratory symptoms with no exam findings to indicate a bacterial infection.  Suspect this is related to seasonal allergies.  Start a daily oral antihistamine such as Zyrtec.  Start Flonase.  Adding azelastine nasal spray.  Since her cough is keeping her awake when she is laying down at night, adding small quantity of Robitussin AC for sparing use.  3. Primary hypertension Managed by cardiology.  Refilling losartan and furosemide per patient request.  Recommend further discussion and management with cardiology as scheduled.  4. Mixed insomnia Would like to avoid  sedating medications at this time due to her difficulty with sleep apnea.  Okay to use melatonin.  Look into sleep hygiene options and make sure to develop a bedtime routine that is consistent with recommendations.  5. OSA (obstructive sleep apnea) Discussed using his CPAP and that it would likely take several weeks to get used to the feeling of it on her face.  Encouraged her to continue using this nightly.  Return in about 6 months (around 08/28/2023) for chronic disease follow up.  ___________________________________________ Clearnce Sorrel, DNP, APRN, FNP-BC [facility]

## 2023-02-25 NOTE — Patient Instructions (Signed)
Boric Acid suppositories for bacterial vaginosis

## 2023-02-27 ENCOUNTER — Ambulatory Visit: Payer: 59 | Admitting: Cardiology

## 2023-03-09 LAB — COLOGUARD: COLOGUARD: POSITIVE — AB

## 2023-03-10 NOTE — Addendum Note (Signed)
Addended bySamuel Bouche on: 03/10/2023 07:21 AM   Modules accepted: Orders

## 2023-03-25 ENCOUNTER — Telehealth: Payer: Self-pay

## 2023-03-25 ENCOUNTER — Other Ambulatory Visit (HOSPITAL_COMMUNITY): Payer: Self-pay

## 2023-03-25 ENCOUNTER — Encounter: Payer: Self-pay | Admitting: Pharmacist

## 2023-03-25 ENCOUNTER — Ambulatory Visit: Payer: 59 | Attending: Cardiology | Admitting: Pharmacist

## 2023-03-25 VITALS — BP 171/82 | HR 61

## 2023-03-25 DIAGNOSIS — I10 Essential (primary) hypertension: Secondary | ICD-10-CM

## 2023-03-25 DIAGNOSIS — I428 Other cardiomyopathies: Secondary | ICD-10-CM

## 2023-03-25 MED ORDER — HYDROCHLOROTHIAZIDE 12.5 MG PO CAPS: 12.5000 mg | ORAL_CAPSULE | Freq: Every day | ORAL | 2 refills | Status: DC

## 2023-03-25 NOTE — Patient Instructions (Signed)
It was nice meeting you today  We would like your blood pressure to be less than 130/80  Please continue your losartan 100mg  once a day  I would like restart hydrochlorothiazide 12.5mg  once a day in the morning for blood pressure  You can start using your furosemide as needed if you feel you are urinating too much  Please send me a message in a few weeks if you are pleased with the medication  Ask your pharmacy if they are able to purchase large blood pressure cuffs  Let me know if you have any questions  Karren Cobble, PharmD, [place], [place], [place], [place], [place], [address] Phone: [phone], Fax: [phone]

## 2023-03-25 NOTE — Progress Notes (Signed)
Patient ID: Alice Stewart                 DOB: 08-10-1956                      MRN: [id number]     HPI: Alice Stewart is a 67 y.o. female referred by Alice Stewart to HTN clinic. PMH is significant for HTN, NICM, HLD, chronic pain, and multiple medication intolerances. Referred to HTN clinic after failing hydralazine.  Patient presents today for first HTN visit. Lives alone in [place] and takes medical transport to [place]. Sons live nearby. She does not have transportation to pick up medications from pharmacy. She is not checking blood pressure at home due to not having a cuff that will fit around her arm.   Has numerous medications on her intolerance list with varied side effects such as nausea, diarrhea, or feeling like she has a cold. Some medications she can take for 6 months but then begins to feel poorly so she stops them. Reports labetalol put her in the hospital.  Has chronic pain in back, shoulders, hip, and knees. Will occasionally take ibuprofen or acetaminophen.  Drinks about 3 cups of coffee per day. Does not drink alcohol or use tobacco. Eats most meals at home due to finances. Uses Mrs Deliah Boston instead of salt.   Takes furosemide daily. Thinks HCTZ was discontinued when she started furosemide.  Diagnosed with OSA but has difficulty wearing her CPAP through the night.  Current HTN meds: losartan 100mg    BP goal: <130/80   Wt Readings from Last 3 Encounters:  02/25/23 246 lb 6.4 oz (111.8 kg)  02/10/23 247 lb 6.4 oz (112.2 kg)  01/17/23 240 lb (108.9 kg)   BP Readings from Last 3 Encounters:  03/25/23 (!) 171/82  02/25/23 (!) 169/78  02/10/23 (!) 191/96   Pulse Readings from Last 3 Encounters:  03/25/23 61  02/25/23 60  02/10/23 72    Renal function: CrCl cannot be calculated (Patient's most recent lab result is older than the maximum 21 days allowed.).  Past Medical History:  Diagnosis Date   Anemia    Back pain    Benign positional vertigo     Cardiomyopathy ([place])    Carpal tunnel syndrome    Goiter    nontoxid mutinodular   Hypertension    Hyperthyroidism    Insulin resistance syndrome    Menopausal disorder    Menorrhagia    Migraine    Muscle spasm    trapezius   Neck pain, acute    Obesity    Seborrheic dermatitis    Thyroid disorder     Current Outpatient Medications on File Prior to Visit  Medication Sig Dispense Refill   Ascorbic Acid (VITAMIN C) 1000 MG tablet Take 1,000 mg by mouth daily.       aspirin 81 MG EC tablet Take 81 mg by mouth daily.     azelastine (ASTELIN) 0.1 % nasal spray Place 2 sprays into both nostrils 2 (two) times daily. Use in each nostril as directed 30 mL 1   Bioflavonoid Products (BIOFLEX PO) Take 2 tablets by mouth daily.     Biotin 10000 MCG TABS Take 10,000 mcg by mouth daily.     Calcium Carb-Cholecalciferol 600-20 MG-MCG TABS Take 1 tablet by mouth daily.     clindamycin (CLEOCIN) 2 % vaginal cream Apply one applicator full at bedtime for 1 week then twice a week. 80 g 3  COLLAGEN PO Take 3 capsules by mouth daily.     ferrous sulfate 325 (65 FE) MG tablet Take 325 mg by mouth every other day.     flecainide (TAMBOCOR) 100 MG tablet Take 1 tablet (100 mg total) by mouth 2 (two) times daily. 180 tablet 3   Fluocinolone Acetonide Scalp (DERMA-SMOOTHE/FS SCALP) 0.01 % OIL Apply 1 application topically daily. 118.28 mL 0   furosemide (LASIX) 20 MG tablet Take 1 tablet (20 mg total) by mouth daily. 90 tablet 3   guaiFENesin-codeine (ROBITUSSIN AC) 100-10 MG/5ML syrup Take 5 mLs by mouth 3 (three) times daily as needed for cough. 75 mL 0   Lactobacillus (PROBIOTIC ACIDOPHILUS PO) Take by mouth.     losartan (COZAAR) 100 MG tablet Take 1 tablet (100 mg total) by mouth daily. 90 tablet 1   Multiple Minerals-Vitamins (GNP CAL MAG ZINC +D3 PO) Take 3 tablets by mouth daily.     Multiple Vitamin (MULTIVITAMIN) tablet Take 1 tablet by mouth daily.       naproxen (NAPROSYN) 500 MG tablet Take  1 tablet (500 mg total) by mouth 2 (two) times daily as needed. 60 tablet 3   NON FORMULARY Take by mouth daily. 2 tablespoons olive oil     OVER THE COUNTER MEDICATION Take 1,000-2,000 mg by mouth daily. Dong quai     Red Clover Leaf Extract 500 MG TABS Take 500 mg by mouth daily.     vitamin B-12 (CYANOCOBALAMIN) 1000 MCG tablet Take 1,000 mcg by mouth daily.     vitamin E 180 MG (400 UNITS) capsule Take 400 Units by mouth daily.     No current facility-administered medications on file prior to visit.    Allergies  Allergen Reactions   Bystolic [Nebivolol Hcl] Other (See Comments)    Nausea.    Coreg [Carvedilol] Swelling   Estradiol Itching    Per patient, medication caused her to have a severe yeast reaction.    Amlodipine-Atorvastatin Other (See Comments)    salty taste in mouth, stomach burning, H/A   Chlorthalidone Nausea Only    Headache   Diltiazem Hcl Other (See Comments)    Dizzy   Lisinopril     backache   Metoprolol Tartrate Other (See Comments)    nausea,HA, dizziness   Simvastatin Other (See Comments)    Myalgia      Assessment/Plan:  1. Hypertension -   HYPERTENSION CONTROL Vitals:   03/25/23 1552 03/25/23 1553  BP: (!) 173/85 (!) 171/82    The patient's blood pressure is elevated above target today.  In order to address the patient's elevated BP: A new medication was prescribed today.    Patient BP 173/85 which is above goal of <130/80 and similar to previous readings. Unknown what home readings are since she is unable to test. Encouraged her to contact her pharmacy to see if they can purchase one from wholesaler.  Recommended using acetaminophen over ibuprofen for pain relief.  Discussed options for medications and classes she has not tried. Has not tried SGLT2i and advised it may help her feel better but likely not reach BP goal. Because she does not think she had adverse effects to HCTZ, will restart at 12.5mg  once daily and have her use  furosemide PRN. Patient will message with how she is tolerating medication after a few weeks. Patient would like to continue to message rather than find transportation into [place].  Continue losartan 100mg  daily Start HCTZ 12.5mg  daily Change furosemide 20mg  to  PRN  Karren Cobble, PharmD, [place], [place], [place], [place], [place], [address] Phone: [phone], Fax: [phone]

## 2023-03-27 ENCOUNTER — Ambulatory Visit: Payer: 59 | Attending: Cardiology | Admitting: Cardiology

## 2023-03-27 ENCOUNTER — Other Ambulatory Visit: Payer: Self-pay | Admitting: Cardiology

## 2023-03-27 ENCOUNTER — Telehealth: Payer: Self-pay | Admitting: *Deleted

## 2023-03-27 ENCOUNTER — Encounter: Payer: Self-pay | Admitting: Cardiology

## 2023-03-27 VITALS — BP 171/82 | HR 61 | Ht 64.0 in | Wt 245.0 lb

## 2023-03-27 DIAGNOSIS — I10 Essential (primary) hypertension: Secondary | ICD-10-CM

## 2023-03-27 DIAGNOSIS — I2510 Atherosclerotic heart disease of native coronary artery without angina pectoris: Secondary | ICD-10-CM

## 2023-03-27 DIAGNOSIS — G4733 Obstructive sleep apnea (adult) (pediatric): Secondary | ICD-10-CM

## 2023-03-27 DIAGNOSIS — R0683 Snoring: Secondary | ICD-10-CM

## 2023-03-27 DIAGNOSIS — I5022 Chronic systolic (congestive) heart failure: Secondary | ICD-10-CM

## 2023-03-27 DIAGNOSIS — R030 Elevated blood-pressure reading, without diagnosis of hypertension: Secondary | ICD-10-CM

## 2023-03-27 NOTE — Patient Instructions (Signed)
Medication Instructions:  Your physician recommends that you continue on your current medications as directed. Please refer to the Current Medication list given to you today.  *If you need a refill on your cardiac medications before your next appointment, please call your pharmacy*   Lab Work: None.  If you have labs (blood work) drawn today and your tests are completely normal, you will receive your results only by: [place] (if you have MyChart) OR A paper copy in the mail If you have any lab test that is abnormal or we need to change your treatment, we will call you to review the results.   Testing/Procedures: Dr. Radford Pax has ordered a 24 hour blood pressure cuff for you. Someone from our monitoring department will call you to coordinate this test. It is a test you can do at home.    Follow-Up:  Your next appointment:   10 week(s)  Provider:   Virtual  with Dr. Fransico Him, MD   Other Instructions Dr. Radford Pax has ordered a new chin strap for your CPAP equipment. Someone will call from our sleep team or from your DME company to get this delivered to you.

## 2023-03-27 NOTE — Progress Notes (Addendum)
SLEEP MEDICINE VIRTUAL New Patient  NOTE via Video Note   Because of Alice Stewart co-morbid illnesses, she is at least at moderate risk for complications without adequate follow up.  This format is felt to be most appropriate for this patient at this time.  All issues noted in this document were discussed and addressed.  A limited physical exam was performed with this format.  Please refer to the patient's chart for her consent to telehealth for [facility].     Date:  03/27/2023   ID:  Alice Stewart, DOB [birth year], MRN [id number] The patient was identified using 2 identifiers.  Patient Location: Home Provider Location: Home Office   PCP:  Christen Butter, NP    HeartCare Providers Cardiologist:  Olga Millers, MD Electrophysiologist:  [name], MD     Evaluation Performed:  Consultation - ELAURA CALIX was referred by Olga Millers, MD for the evaluation of OSA.  Chief Complaint:  OSA  History of Present Illness:    Alice Stewart is a 67 y.o. female who is being seen today for the evaluation of OSA at the request of Olga Millers, MD.  Alice Stewart is a 67 y.o. female with a history of nonischemic cardiomyopathy, elevated coronary calcium score, hypertension, hyperlipidemia, PVCs and obesity.  At patient's last office visit with Dr. Jens Som in December there was concern that she was high risk for obstructive sleep apnea and given her LV dysfunction sleep study was recommended.  She underwent home sleep study showing moderate obstructive sleep apnea with an AHI of 22.6/h and no significant central sleep apnea.  She did have nocturnal hypoxemia with O2 sats as low as 76%.  She underwent CPAP titration on 01/17/2023 and was titrated to CPAP at 15 cm H2O.  She is now referred for sleep medicine consultation to establish sleep care and treatment.  It has taken her a while to get used to her CPAP.  She tolerates the nasal mask but is  having a lot of mouth dryness.  She does not use a chin strap.  She feels the pressure is adequate.  Since going on PAP she feels more rested in the am and has no significant daytime sleepiness.  She denies any significant nasal dryness or nasal congestion.  She does not think that he snores.    Past Medical History:  Diagnosis Date   Anemia    Back pain    Benign positional vertigo    Cardiomyopathy    Carpal tunnel syndrome    Goiter    nontoxid mutinodular   Hypertension    Hyperthyroidism    Insulin resistance syndrome    Menopausal disorder    Menorrhagia    Migraine    Muscle spasm    trapezius   Neck pain, acute    Obesity    Seborrheic dermatitis    Thyroid disorder    Past Surgical History:  Procedure Laterality Date   CHOLECYSTECTOMY     [place]   DILATION AND CURETTAGE OF UTERUS  03/23/2008   ENDOMETRIAL ABLATION  03/23/2008   GREAT TOE ARTHRODESIS, METATARSALPHALANGEAL JOINT  05/23/2006   Rt foot   HEEL SPUR SURGERY  10/23/1986   Lt foot   ICD GENERATOR CHANGEOUT N/A 01/18/2022   Procedure: ICD GENERATOR CHANGEOUT;  Surgeon: Regan Lemming, MD;  Location[facility] INVASIVE CV LAB;  Service: Cardiovascular;  Laterality: N/A;   TUBAL LIGATION  10/23/1989  Current Meds  Medication Sig   Ascorbic Acid (VITAMIN C) 1000 MG tablet Take 1,000 mg by mouth daily.     aspirin 81 MG EC tablet Take 81 mg by mouth daily.   Bioflavonoid Products (BIOFLEX PO) Take 2 tablets by mouth daily.   Biotin 16109 MCG TABS Take 10,000 mcg by mouth daily.   Calcium Carb-Cholecalciferol 600-20 MG-MCG TABS Take 1 tablet by mouth daily.   clindamycin (CLEOCIN) 2 % vaginal cream Apply one applicator full at bedtime for 1 week then twice a week.   COLLAGEN PO Take 3 capsules by mouth daily.   ferrous sulfate 325 (65 FE) MG tablet Take 325 mg by mouth every other day.   flecainide (TAMBOCOR) 100 MG tablet Take 1 tablet (100 mg total) by mouth 2 (two) times daily.   Fluocinolone  Acetonide Scalp (DERMA-SMOOTHE/FS SCALP) 0.01 % OIL Apply 1 application topically daily.   hydrochlorothiazide (MICROZIDE) 12.5 MG capsule Take 1 capsule (12.5 mg total) by mouth daily.   Lactobacillus (PROBIOTIC ACIDOPHILUS PO) Take by mouth.   Multiple Minerals-Vitamins (GNP CAL MAG ZINC +D3 PO) Take 3 tablets by mouth daily.   Multiple Vitamin (MULTIVITAMIN) tablet Take 1 tablet by mouth daily.     NON FORMULARY Take by mouth daily. 2 tablespoons olive oil   OVER THE COUNTER MEDICATION Take 1,000-2,000 mg by mouth daily. Dong quai   Red Clover Leaf Extract 500 MG TABS Take 500 mg by mouth daily.   vitamin B-12 (CYANOCOBALAMIN) 1000 MCG tablet Take 1,000 mcg by mouth daily.   vitamin E 180 MG (400 UNITS) capsule Take 400 Units by mouth daily.     Allergies:   Bystolic [nebivolol hcl], Coreg [carvedilol], Estradiol, Amlodipine-atorvastatin, Chlorthalidone, Diltiazem hcl, Lisinopril, Metoprolol tartrate, and Simvastatin   Social History   Tobacco Use   Smoking status: Former    Types: Cigarettes    Quit date: 09/22/2004    Years since quitting: 18.5   Smokeless tobacco: Never  Vaping Use   Vaping Use: Never used  Substance Use Topics   Alcohol use: Not Currently    Alcohol/week: 1.0 standard drink of alcohol    Types: 1 Standard drinks or equivalent per week    Comment: 1 weekly   Drug use: No     Family Hx: The patient's family history includes Diabetes in an other family member; Hypertension in her father and sister; Hypothyroidism in her sister; Other in her mother.  ROS:   Please see the history of present illness.     All other systems reviewed and are negative.   Prior Sleep studies:   The following studies were reviewed today:  Home sleep study, CPAP titration, PAP compliance download  Labs/Other Tests and Data Reviewed:     Recent Labs: 04/11/2022: TSH 1.89 12/04/2022: BUN 16; Creat 0.98; Potassium 3.8; Sodium 144    Wt Readings from Last 3 Encounters:   03/27/23 245 lb (111.1 kg)  02/25/23 246 lb 6.4 oz (111.8 kg)  02/10/23 247 lb 6.4 oz (112.2 kg)     Risk Assessment/Calculations:      STOP-Bang Score:  6      Objective:    Vital Signs:  BP (!) 171/82   Pulse 61   Ht 5\' 4"  (1.626 m)   Wt 245 lb (111.1 kg)   LMP 09/27/2011   SpO2 92%   BMI 42.05 kg/m    VITAL SIGNS:  reviewed GEN:  no acute distress EYES:  sclerae anicteric, EOMI - Extraocular Movements  Intact RESPIRATORY:  normal respiratory effort, symmetric expansion CARDIOVASCULAR:  no peripheral edema SKIN:  no rash, lesions or ulcers. MUSCULOSKELETAL:  no obvious deformities. NEURO:  alert and oriented x 3, no obvious focal deficit PSYCH:  normal affect  ASSESSMENT & PLAN:    OSA - The patient is tolerating PAP therapy well without any problems. The PAP download performed by his DME was personally reviewed and interpreted by me today and showed an AHI of 0.9/hr on CPAP at 15 cm H2O with 13% compliance in using more than 4 hours nightly.  The patient has been using and benefiting from PAP use and will continue to benefit from therapy.  -I have encouraged her to be more compliant with her device>>her usage has been low due to severe mouth and throat dryness -I will order a chin strap to help with mouth dryness  Hypertension -Blood pressure is elevated on exam today -Continue prescription drug management with HCTZ 12.5 mg daily -I will get a 24 hr BP monitor as she does not have one at home   Time:   Today, I have spent 45 minutes with the patient with telehealth technology discussing the above problems which included time spent reviewing OV notes from referring MD, reviewing home sleep study/CPAP titration with patient, reviewing PAP compliance download and education on CPAP.    Medication Adjustments/Labs and Tests Ordered: Current medicines are reviewed at length with the patient today.  Concerns regarding medicines are outlined above.   Tests Ordered: No  orders of the defined types were placed in this encounter.   Medication Changes: No orders of the defined types were placed in this encounter.   Follow Up:  Virtual Visit  in 8 week(s)  Signed, Armanda Magic, MD  03/27/2023 11:16 AM    [facility]

## 2023-03-27 NOTE — Telephone Encounter (Signed)
Order placed to [facility] via community message. 

## 2023-03-27 NOTE — Telephone Encounter (Signed)
-----   Message from Joni Reining, RN sent at 03/27/2023 11:38 AM EDT ----- Regarding: New chin strap Hello, Dr. Radford Pax would like to order a new chin strap for this patient.  Thanks,  Danae Chen

## 2023-04-07 ENCOUNTER — Encounter: Payer: Self-pay | Admitting: *Deleted

## 2023-04-08 ENCOUNTER — Telehealth: Payer: Self-pay | Admitting: Cardiology

## 2023-04-08 MED ORDER — LOSARTAN POTASSIUM 100 MG PO TABS: 100.0000 mg | ORAL_TABLET | Freq: Every day | ORAL | 3 refills | Status: DC

## 2023-04-08 NOTE — Telephone Encounter (Signed)
Pt c/o medication issue:  1. Name of Medication: Losartan 100mg    2. How are you currently taking this medication (dosage and times per day)?   3. Are you having a reaction (difficulty breathing--STAT)?   4. What is your medication issue? Pt asking for a refill on this med but it is not on med list anymore. Please advise.

## 2023-04-08 NOTE — Telephone Encounter (Signed)
Patient states she takes losartan 100 mg daily. It is not on her medication list but is on her AVS to continue taking as prescribed. You did not prescribe her the losartan but she did stated she spoke with you about it. She will need new prescription by the end of the month. Are you ok with me sending in Rx to her pharmacy for losartan 100mg  daily.

## 2023-04-08 NOTE — Telephone Encounter (Signed)
Spoke with pt, Refill sent to the pharmacy electronically. She is having a lot of pain from the bursitis in her hip and leg. She is taking ibuprofen and tylenol and is still having a lot of pain. She was encouraged to make sure to eat if taking ibuprofen and also reach out to her orthopedic or medical doctor for recommendations.

## 2023-04-09 ENCOUNTER — Telehealth: Payer: Self-pay | Admitting: Cardiology

## 2023-04-09 NOTE — Telephone Encounter (Signed)
  Pt c/o swelling: STAT is pt has developed SOB within 24 hours  If swelling, where is the swelling located? Both ankles down to her toes   How much weight have you gained and in what time span?   Have you gained 3 pounds in a day or 5 pounds in a week?   Do you have a log of your daily weights (if so, list)?   Are you currently taking a fluid pill? Yes   Are you currently SOB? Yes when moving around  Have you traveled recently? No    Pt said, since she started taking hydrochlorothiazide her ankles are swollen. Did not gain weight but she get sob when moving around

## 2023-04-09 NOTE — Telephone Encounter (Signed)
Patient states she has a lot of swelling since starting the HCTZ 12.5mg . She states when on it before she was at a 25mg  dose.  This time when restarted it is 12.5.  She has swelling to bilateral feet and ankles that looks "like a football"  She states her lower legs are tight.  Some SOB this an and when lying down.  Looked at OV notes from pharmacy and states HCTZ 12.5 mg and Lasix 20 mg PRN.  She has not taken Lasix but stopped all together.  Advised to take Lasix 20 mg now.  Again tomorrow and then call on Friday to let us know how swelling is.   Also had her weigh while on phone, even though late in afternoon.  She states she had been about 244 a few weeks ago but just now is at 249.6.  she will also weigh again in the morning and again on Friday.  She will call Friday morning with her weight and to advise of the swelling, to see if continue the Lasix.  If any further recommendations will call patient prior to Friday

## 2023-04-11 ENCOUNTER — Telehealth: Payer: Self-pay | Admitting: Cardiology

## 2023-04-11 DIAGNOSIS — Z79899 Other long term (current) drug therapy: Secondary | ICD-10-CM

## 2023-04-11 MED ORDER — FUROSEMIDE 20 MG PO TABS: 20.0000 mg | ORAL_TABLET | Freq: Every day | ORAL | 0 refills | Status: DC

## 2023-04-11 NOTE — Telephone Encounter (Signed)
H&V Care Navigation CSW Progress Note  Clinical Social Worker  mailed pt resources again regarding transportation  to get to labs/needed care. Pt had previously been provided transportation resources in 11/2022. She has access to [facility] Medicare rides, Medicaid transportation and alternatively had been provided with [facility] [place] which also provides non emergency transportation. Will provide these again to RN.  Patient is participating in a Managed Medicaid Plan:  No, UHC Medicare and Medicaid  SDOH Screenings   Food Insecurity: Food Insecurity Present (01/27/2023)  Housing: Low Risk  (01/27/2023)  Transportation Needs: Unmet Transportation Needs (01/27/2023)  Utilities: Not At Risk (01/27/2023)  Alcohol Screen: Low Risk  (01/31/2023)  Depression (PHQ2-9): Low Risk  (02/25/2023)  Financial Resource Strain: Low Risk  (01/27/2023)  Physical Activity: Insufficiently Active (01/27/2023)  Social Connections: Socially Isolated (01/31/2023)  Stress: No Stress Concern Present (01/27/2023)  Tobacco Use: Medium Risk (03/27/2023)   Octavio Graves, MSW, LCSW Clinical Social Worker II [facility] Heart/Vascular Care Navigation  [phone]- work cell phone (preferred) [phone]- desk phone

## 2023-04-11 NOTE — Telephone Encounter (Signed)
April 10, 2023 Alice Bunting, MD  to Alice Starr, RN     04/10/23  8:22 AM DC HCTZ; lasix 20 mg daily; bmet one week Alice Stewart   Returned call to patient who states that her weight is down from 249lb  to 245lb today. Advised patient of the above from Dr. Jens Som- HCTZ  removed from patient list. New prescription for Lasix  sent to patient preferred pharmacy. Lab work ordered. Patient has issues with transportation- spoke with LCSW about resources- gave patient the number to call for this as listed below. Patient will call numbers given.   Per [facility]) Medicaid of [place]- [phone]  [facility] [phone]   If [facility] Medicare, Medicaid and [facility] (along with her son) cannot take her to needed appt then just let me know and I'll call but she should utilize these first!

## 2023-04-11 NOTE — Telephone Encounter (Signed)
Patient said that she was returning your phone call from Wednesday

## 2023-04-11 NOTE — Telephone Encounter (Signed)
See 4/19 telephone note

## 2023-04-14 ENCOUNTER — Ambulatory Visit: Payer: 59 | Attending: Cardiology

## 2023-04-14 DIAGNOSIS — G4733 Obstructive sleep apnea (adult) (pediatric): Secondary | ICD-10-CM

## 2023-04-14 DIAGNOSIS — R030 Elevated blood-pressure reading, without diagnosis of hypertension: Secondary | ICD-10-CM | POA: Diagnosis not present

## 2023-04-14 DIAGNOSIS — I10 Essential (primary) hypertension: Secondary | ICD-10-CM

## 2023-04-15 ENCOUNTER — Telehealth: Payer: Self-pay | Admitting: Cardiology

## 2023-04-15 NOTE — Telephone Encounter (Signed)
Routing to monitor dept  ( 24 hour blood pressure) to assist patient

## 2023-04-15 NOTE — Telephone Encounter (Signed)
Patient is returning call. Requesting return call. Patient did want to notate that she has taken the BP cuff off.

## 2023-04-15 NOTE — Telephone Encounter (Signed)
Patient called and mentioned that her BP monitor stopped working at around 8 this morning. Says that an error message E4 comes up and it squeezes her arm too high where it leaves a mark on her arm

## 2023-04-15 NOTE — Telephone Encounter (Signed)
LVM to return call to office

## 2023-04-15 NOTE — Telephone Encounter (Signed)
Explained a code 4 was if the monitor timed out for not obtaining a blood pressure within 140 seconds. Attempted to have patient restart blood pressure monitor over the phone. We were not able to restart the monitor. Patient did wear the monitor 15 hours.  She will bring monitor in after 5 on Wednesday to process that 15 hours and to present to Dr. Mayford Knife for evaluation. Patient does not drive and relies on son to transport her after work. Patient would need to come in after Thursday to attempt a redo on our only other monitor.  She prefers just to process what was recorded.

## 2023-04-17 ENCOUNTER — Telehealth: Payer: Self-pay | Admitting: *Deleted

## 2023-04-17 NOTE — Telephone Encounter (Signed)
Left message for pt to call, was able to get her a virtual appointment scheduled with caitlin walker np that works with dr Duke Salvia in HTN clinic 05-29-23 @ 9:15 am. Also wanted to see if she is willing to change from losartan to irbesartan 300 mg once daily since she has been able to tolerate arbs.

## 2023-04-17 NOTE — Telephone Encounter (Signed)
-----   Message from Lewayne Bunting, MD sent at 04/17/2023  9:38 AM EDT ----- Please schedule ov with Dr Migdalia Dk  ----- Message ----- From: Quintella Reichert, MD Sent: 04/17/2023   9:36 AM EDT To: Lewayne Bunting, MD  Arlys John I saw this patient for sleep and her Bp was very high - got a 24 hour BP monitor (attached).  I saw you referred her to HTN clinic but that still has not been scheduled - thought you might want to know and will leave med adjustments to you - you may need to call Tiffany to work her in ----- Message ----- From: Quintella Reichert, MD Sent: 04/17/2023   9:30 AM EDT To: Quintella Reichert, MD

## 2023-04-17 NOTE — Telephone Encounter (Signed)
Spoke with pt, she reports she is unable to get transportation to come to [employer] for appointments. She reports while wearing her CPAP last night she woke with a severe pain in the right side of her head. She took off the CPAP and the headache went away. She then reapplied the CPAP and did fine. She also reports for 1 weeks now a constant dull headache that she feels is related to her bp. Discussed with Dr Jens Som, will try spironolactone 25 mg once daily. She will need BMP in one week. Patient reports she has tried that before and had not tolerated it and  she has also not able to tolerate doxazosin.

## 2023-04-18 ENCOUNTER — Ambulatory Visit (INDEPENDENT_AMBULATORY_CARE_PROVIDER_SITE_OTHER): Payer: 59

## 2023-04-18 DIAGNOSIS — I428 Other cardiomyopathies: Secondary | ICD-10-CM | POA: Diagnosis not present

## 2023-04-18 LAB — CUP PACEART REMOTE DEVICE CHECK
Battery Remaining Longevity: 73 mo
Battery Remaining Percentage: 84 %
Battery Voltage: 2.99 V
Brady Statistic AP VP Percent: 50 %
Brady Statistic AP VS Percent: 1 %
Brady Statistic AS VP Percent: 50 %
Brady Statistic AS VS Percent: 1 %
Brady Statistic RA Percent Paced: 49 %
Date Time Interrogation Session: 20240426030133
HighPow Impedance: 63 Ohm
Implantable Lead Connection Status: 753985
Implantable Lead Connection Status: 753985
Implantable Lead Connection Status: 753985
Implantable Lead Implant Date: 20150303
Implantable Lead Implant Date: 20150303
Implantable Lead Implant Date: 20150303
Implantable Lead Location: 753858
Implantable Lead Location: 753859
Implantable Lead Location: 753860
Implantable Pulse Generator Implant Date: 20230127
Lead Channel Impedance Value: 390 Ohm
Lead Channel Impedance Value: 440 Ohm
Lead Channel Impedance Value: 530 Ohm
Lead Channel Pacing Threshold Amplitude: 1.25 V
Lead Channel Pacing Threshold Amplitude: 1.375 V
Lead Channel Pacing Threshold Amplitude: 1.625 V
Lead Channel Pacing Threshold Pulse Width: 0.5 ms
Lead Channel Pacing Threshold Pulse Width: 0.5 ms
Lead Channel Pacing Threshold Pulse Width: 0.7 ms
Lead Channel Sensing Intrinsic Amplitude: 12 mV
Lead Channel Sensing Intrinsic Amplitude: 2.1 mV
Lead Channel Setting Pacing Amplitude: 2.125
Lead Channel Setting Pacing Amplitude: 2.25 V
Lead Channel Setting Pacing Amplitude: 2.375
Lead Channel Setting Pacing Pulse Width: 0.5 ms
Lead Channel Setting Pacing Pulse Width: 0.7 ms
Lead Channel Setting Sensing Sensitivity: 0.5 mV
Pulse Gen Serial Number: 111057141
Zone Setting Status: 755011

## 2023-04-18 NOTE — Addendum Note (Signed)
Addended by: Quintella Reichert on: 04/18/2023 04:44 PM   Modules accepted: Level of Service

## 2023-04-23 MED ORDER — IRBESARTAN 300 MG PO TABS: 300.0000 mg | ORAL_TABLET | Freq: Every day | ORAL | 6 refills | Status: DC

## 2023-04-23 NOTE — Telephone Encounter (Signed)
Patient is returning call. She ask that a VM be left if she [place] unable to answer. Please advise

## 2023-04-23 NOTE — Telephone Encounter (Signed)
Spoke with pt, she is aware of the virtual visit and she is willing to try the irbesartan. New script sent to the pharmacy

## 2023-04-23 NOTE — Telephone Encounter (Signed)
Left message for pt to call.

## 2023-04-25 ENCOUNTER — Ambulatory Visit (INDEPENDENT_AMBULATORY_CARE_PROVIDER_SITE_OTHER): Payer: 59 | Admitting: Medical-Surgical

## 2023-04-25 ENCOUNTER — Encounter: Payer: Self-pay | Admitting: Medical-Surgical

## 2023-04-25 ENCOUNTER — Ambulatory Visit (INDEPENDENT_AMBULATORY_CARE_PROVIDER_SITE_OTHER): Payer: 59

## 2023-04-25 ENCOUNTER — Other Ambulatory Visit: Payer: Self-pay

## 2023-04-25 ENCOUNTER — Other Ambulatory Visit (HOSPITAL_COMMUNITY)
Admission: RE | Admit: 2023-04-25 | Discharge: 2023-04-25 | Disposition: A | Discharge status: Home / Self Care | Payer: 59 | Source: Ambulatory Visit

## 2023-04-25 ENCOUNTER — Other Ambulatory Visit: Payer: Self-pay | Admitting: Medical-Surgical

## 2023-04-25 VITALS — BP 172/78 | HR 62 | Wt 250.0 lb

## 2023-04-25 VITALS — BP 181/75 | HR 60 | Resp 20 | Ht 64.0 in | Wt 251.7 lb

## 2023-04-25 DIAGNOSIS — I10 Essential (primary) hypertension: Secondary | ICD-10-CM | POA: Diagnosis not present

## 2023-04-25 DIAGNOSIS — N898 Other specified noninflammatory disorders of vagina: Secondary | ICD-10-CM

## 2023-04-25 DIAGNOSIS — Z1231 Encounter for screening mammogram for malignant neoplasm of breast: Secondary | ICD-10-CM | POA: Diagnosis not present

## 2023-04-25 DIAGNOSIS — G8929 Other chronic pain: Secondary | ICD-10-CM

## 2023-04-25 DIAGNOSIS — Z113 Encounter for screening for infections with a predominantly sexual mode of transmission: Secondary | ICD-10-CM | POA: Insufficient documentation

## 2023-04-25 DIAGNOSIS — M25551 Pain in right hip: Secondary | ICD-10-CM

## 2023-04-25 MED ORDER — CYCLOBENZAPRINE HCL 10 MG PO TABS: 5.0000 mg | ORAL_TABLET | Freq: Three times a day (TID) | ORAL | 1 refills | Status: DC | PRN

## 2023-04-25 NOTE — Progress Notes (Signed)
Pt presents for malodorous watery vaginal discharge x 3 weeks after using Boric Acid.  She requests all STD testing.

## 2023-04-25 NOTE — Progress Notes (Signed)
        Established patient visit  History, exam, impression, and plan:  1. Chronic right hip pain Pleasant 67 year old female presenting today with a history of chronic right hip pain.  Unfortunately, over the last couple of months this has gotten worse.  She is now having pain on the posterior buttock around the hip joint into the groin.  Her pain is impacting her ability to perform daily activities as well as ambulation.  No recent injury or fall.  Does note lifting a heavy laundry basket and carrying it for a bit a couple of months ago and feels this is what is causing her pain to be worse.  Has been using Tylenol, IcyHot, and heat with minimal to moderate relief of symptoms.  With her current uncontrolled blood pressure, concerns for adding prednisone.  Reports that her steroid shot from Dr. Karie Schwalbe last year was not helpful and is not interested in this option.  Updating right hip x-rays today.  Continue Tylenol, IcyHot, and heat.  Consider using ice.  Adding Flexeril 5-10 mg 3 times daily as needed for pain control.  Resume right hip physical therapy exercises at home.  2. Primary hypertension Managed by cardiology.  Her blood pressure has been very high lately.  Her cardiologist placed her on irbesartan 300 mg yesterday.  She took her first dose last night but has not noticed any benefit so far.  Discussed emergency symptoms to seek care for and monitoring of her blood pressure.  Procedures performed this visit: None.  Return if symptoms worsen or fail to improve.  __________________________________ [name], DNP, APRN, FNP-BC Primary Care and Sports Medicine [facility]

## 2023-04-25 NOTE — Progress Notes (Signed)
   GYNECOLOGY PROGRESS NOTE  History:  67 y.o. [id number] presents to [facility] office today for problem gyn visit. She reports approximately 3 weeks ago she took boric acid for 6 nights. Since she completed the boric acid she reports a milky, clear discharge that itches and smells like milk. She reports she does not feel clean. She also reports vaginal dryness in which she previously used replense, however has not used recently due to discharge. She denies bleeding or pelvic pain. She was last sexually active in February.   The following portions of the patient's history were reviewed and updated as appropriate: allergies, current medications, past family history, past medical history, past social history, past surgical history and problem list. Last pap smear on 04/11/2022 NILM, negative HRHPV.  Health Maintenance Due  Topic Date Due   COVID-19 Vaccine (4 - 2023-24 season) 08/23/2022     Review of Systems:  Pertinent items are noted in HPI.   Objective:  Physical Exam Today's Vitals   04/25/23 1027 04/25/23 1106  BP: (!) 191/74 (!) 172/78  Pulse: 65 62  Weight: 250 lb (113.4 kg)   Body mass index is 42.91 kg/m.    VS reviewed, nursing note reviewed,  Constitutional: well developed, well nourished, no distress HEENT: normocephalic CV: normal rate Pulm/chest wall: normal effort Breast Exam: deferred Abdomen: soft Neuro: alert and oriented x 3 Skin: warm, dry Psych: affect normal Pelvic exam: Cervix pink, visually closed, without lesion, moderate amount of clear, watery discharge, vaginal walls pink, minimal rugae present, and external genitalia normal  Assessment & Plan:  1. Vaginal itching - Patient has history of recurrent BV. Instructed patient to avoid all soaps and feminine washes/sprays. Continue to wear cotton underwear.  - Vaginal swab today- will treat accordingly  - Cervicovaginal ancillary only( [facility])  2. Screening examination for STI - Screening per  patient request  - Cervicovaginal ancillary only( [facility]) - Hepatitis C antibody - Hepatitis B surface antigen - HIV Antibody (routine testing w rflx) - RPR  3. Breast cancer screening by mammogram  - MM 3D SCREENING MAMMOGRAM BILATERAL BREAST; Future  4. Primary hypertension - Patient has appointment with PCP today  No follow-ups on file.   Brand Males, CNM 11:23 AM

## 2023-04-26 LAB — RPR: RPR Ser Ql: NONREACTIVE

## 2023-04-26 LAB — HIV ANTIBODY (ROUTINE TESTING W REFLEX): HIV Screen 4th Generation wRfx: NONREACTIVE

## 2023-04-26 LAB — HEPATITIS B SURFACE ANTIGEN: Hepatitis B Surface Ag: NEGATIVE

## 2023-04-26 LAB — HEPATITIS C ANTIBODY: Hep C Virus Ab: NONREACTIVE

## 2023-04-28 ENCOUNTER — Telehealth: Payer: Self-pay | Admitting: Cardiology

## 2023-04-28 DIAGNOSIS — I5022 Chronic systolic (congestive) heart failure: Secondary | ICD-10-CM

## 2023-04-28 MED ORDER — FUROSEMIDE 20 MG PO TABS: 40.0000 mg | ORAL_TABLET | Freq: Every day | ORAL | 3 refills | Status: DC

## 2023-04-28 NOTE — Telephone Encounter (Signed)
Patient states no readings from home just those at her two doctor appt.s .  She has swelling to feet, ankles and calves. Pitting edema.  SOB. Patient states she currently is dizzy, slight headache. Has taken her Irbesartan for 5 days now.   Weights   Friday 250 Today was 250 Taking Furosemide 20 mg daily Please advise

## 2023-04-28 NOTE — Telephone Encounter (Signed)
Spoke with pt, Aware of dr Ludwig Clarks recommendations. Lab orders mailed to the pt  Follow up scheduled

## 2023-04-28 NOTE — Telephone Encounter (Signed)
Pt c/o medication issue:  1. Name of Medication:   irbesartan (AVAPRO) 300 MG tablet   2. How are you currently taking this medication (dosage and times per day)?  As prescribed  3. Are you having a reaction (difficulty breathing--STAT)?   SOB, feet, ankles and knees down to toes hurts, dizziness, and blurred vision  4. What is your medication issue?    Patient stated she has taken this medication for 5 nights at bedtime and she is reporting SOB; feet, ankles and knees down to toes hurts;p dizziness, and blurred vision.  Patient stated the saw her gynecologist at 10:15 am on 5/3 and saw her PCP at 1:30 pm the same day and her BP reading had gone from 172/78 in the morning to 191/? that afternoon.

## 2023-04-29 LAB — CERVICOVAGINAL ANCILLARY ONLY
Bacterial Vaginitis (gardnerella): NEGATIVE
Candida Glabrata: NEGATIVE
Candida Vaginitis: NEGATIVE
Chlamydia: NEGATIVE
Comment: NEGATIVE
Comment: NEGATIVE
Comment: NEGATIVE
Comment: NEGATIVE
Comment: NEGATIVE
Comment: NORMAL
Neisseria Gonorrhea: NEGATIVE
Trichomonas: NEGATIVE

## 2023-04-29 LAB — BASIC METABOLIC PANEL
BUN/Creatinine Ratio: 20 (ref 12–28)
BUN: 19 mg/dL (ref 8–27)
CO2: 24 mmol/L (ref 20–29)
Calcium: 10.1 mg/dL (ref 8.7–10.3)
Chloride: 102 mmol/L (ref 96–106)
Creatinine, Ser: 0.94 mg/dL (ref 0.57–1.00)
Glucose: 104 mg/dL — ABNORMAL HIGH (ref 70–99)
Potassium: 4.2 mmol/L (ref 3.5–5.2)
Sodium: 143 mmol/L (ref 134–144)
eGFR: 68 mL/min/{1.73_m2} (ref >59)

## 2023-04-29 LAB — SPECIMEN STATUS REPORT

## 2023-04-30 NOTE — Progress Notes (Signed)
Cardiology Clinic Note   Date: 05/02/2023 ID: Alice Stewart, DOB 1956/08/04, MRN [id number]  Primary Cardiologist:  Olga Millers, MD  Patient Profile    Alice Stewart is a 67 y.o. female who presents to the clinic today for lower extremity edema and shortness of breath.  Past medical history significant for: Chronic systolic heart failure/nonischemic cardiomyopathy. S/p CRT-D 2015. GEN change 01/18/2022. Remote device check 04/18/2023: Normal device function.  Battery and lead parameters stable. Echo 09/03/2021: EF 45 to 50%.  Moderate LVH.  Grade I DD.  Mild MR.  Mild AI.  Echo from outside facility May 2021 showed LVEF 55 to 60%. PVCs. Hypertension. 24-hour blood pressure monitor 04/15/2023: Overall average BP 205/85.  Average BP while awake 201/85.  Average BP while asleep at 208/85.  100% of SBP > 140 while awake and > 120 while asleep.  35% of SBP >90 while awake and >80 while asleep. Hyperlipidemia. Migraines. Hypothyroidism. OSA.   History of Present Illness    Alice Stewart was first evaluated by Dr. Jens Som on 08/20/2021 for cardiomyopathy at the request of Dr. Ashley Royalty after moving back to the area from [place].  Patient was previously followed by Dr. Mayra Reel in [place], [place].  She has a history of an echo in 2014 showing EF 25 to 30% with LBBB.  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  She had CRT-D placed in 2015.  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  Echo in May 2021 showed EF 55 to 60%.  PVCs were treated with flecainide.  She continues to be followed by Dr. Jens Som for the above outlined history.  She is followed by Dr. Elberta Fortis for ICD management and Dr. Mayford Knife for OSA with CPAP.  Patient was last seen in the office by Dr. Jens Som on 02/10/2023.  Her BP was elevated at that time.  She has multiple medication intolerances to include beta-blockers, Entresto, amlodipine,  diltiazem, clonidine, Cardura.  She was started on hydralazine 25 mg twice daily and referred to the hypertension clinic.  She followed up with Dr. Mayford Knife on 03/27/2023.  Her BP was elevated and 24-hour BP monitoring was ordered (details above).  Patient with several calls to triage: 02/11/2023 regarding hydralazine: Had symptoms of chest heaviness, SOB, dizziness, and being unable to sleep after taking one tablet. She reports all symptoms had stopped by the time she woke up this morning & denies having any symptoms now. Due to this reaction she has not taken any of the medication today.  Dr. Jens Som: Hydralazine stopped and patient instructed to monitor BP.  04/08/2023: Called in for refill of Losartan 100 mg which was not on medication list. Patient reported she had been taking this medication. Dr. Jens Som agreed with her continuing this if she was tolerating it. Refill sent. Patient also complained of hip and leg pain from bursitis and was instructed to continue ibuprofen with food and Tylenol for pain control and to reach out to orthopedist or PCP. 04/09/2023: Complaints of bilateral ankle edema and DOE without weight gain since starting HCTZ. "Patient states she has a lot of swelling since starting the HCTZ 12.5mg . She states when on it before she was at a 25mg  dose.  This time when restarted it is 12.5.  She has swelling to bilateral feet and ankles that looks "like a football"  She states her lower legs are tight.  Some SOB this am and  when lying down.  Looked at OV notes from pharmacy and states HCTZ 12.5 mg and Lasix 20 mg PRN.  She has not taken Lasix but stopped all together.  Advised to take Lasix 20 mg now.  Again tomorrow and then call on Friday to let us know how swelling is. Also had her weigh while on phone, even though late in afternoon.  She states she had been about 244 a few weeks ago but just now is at 249.6.  she will also weigh again in the morning and again on Friday.  She will call  Friday morning with her weight and to advise of the swelling, to see if continue the Lasix.  If any further recommendations will call patient prior to Friday." Per Dr. Jens Som: DC HCTZ start Lasix 20 mg daily.  BMP in 1 week. 04/17/2023: Spoke with pt, she reports she is unable to get transportation to come to [employer] for appointments. She reports while wearing her CPAP last night she woke with a severe pain in the right side of her head. She took off the CPAP and the headache went away. She then reapplied the CPAP and did fine. She also reports for 1 weeks now a constant dull headache that she feels is related to her bp. Discussed with Dr Jens Som, will try spironolactone 25 mg once daily. She will need BMP in one week. Patient reports she has tried that before and had not tolerated it and  she has also not able to tolerate doxazosin.  Patient scheduled with Gillian Shields, NP 05/29/2023. Patient changed from losartan to irbesartan 300 mg daily.  04/28/2023 regarding irbesartan: Patient stated she has taken this medication for 5 nights at bedtime and she is reporting SOB; feet, ankles and knees down to toes hurts;p dizziness, and blurred vision.  Patient stated the saw her gynecologist at 10:15 am on 5/3 and saw her PCP at 1:30 pm the same day and her BP reading had gone from 172/78 in the morning to 191/? that afternoon. Patient states no readings from home just those at her two doctor appts. She has swelling to feet, ankles and calves. Pitting edema.  SOB. Patient states she currently is dizzy, slight headache. Has taken her Irbesartan for 5 days now.  Weights  Friday 250 Today was 250 Taking Furosemide 20 mg daily Please advise. Per Dr. Jens Som: Increase lasix to 40 mg daily. BMP in 1 week. Schedule visit with APP.   Today, patient reports lower extremity edema about the same.  Weight at home was 248 lb and has been stable for the past several days.  She reports brisk diuresis with increase Lasix.   She reports shortness of breath occurs at night when she lays down on her back.  She uses CPAP.  She also reports dyspnea with heavy exertion but none with normal activities.  She reports lower extremity edema is best in the a.m. and progresses throughout the day.  She admits to sitting dependently the majority of the day.  She does prop her legs on a low stool at times.  She has never worn compression socks.  She denies recent consumption of deli meat or canned foods.  She uses Mrs. Dash for seasoning.  She does endorse eating peanut butter and jelly or cheese sandwiches.  She reports continued concerns with BP.  BP today 158/82 on intake and 150/80 on right arm at my recheck.  She does not check her BP at home secondary to not being able  to find a cuff that fits her arm appropriately.  She is concerned with her dose of irbesartan and feels it is too much all at once.     ROS: All other systems reviewed and are otherwise negative except as noted in History of Present Illness.  Studies Reviewed    ECG personally reviewed by me today: A sensed V paced, 64 bpm.  No significant changes from 12/04/2022.  Risk Assessment/Calculations      HYPERTENSION CONTROL Vitals:   05/02/23 1444 05/02/23 1457 05/02/23 1811 05/02/23 1812  BP: (!) 158/82 (!) 158/80 (!) 160/80 (!) 150/80    The patient's blood pressure is elevated above target today.  In order to address the patient's elevated BP: A current anti-hypertensive medication was adjusted today.; A referral to the Advanced Hypertension Clinic will be placed.      STOP-Bang Score:  6       Physical Exam    VS:  BP (!) 158/80   Pulse 63   Ht 5\' 4"  (1.626 m)   Wt 252 lb (114.3 kg)   LMP 09/27/2011   SpO2 96%   BMI 43.26 kg/m  , BMI Body mass index is 43.26 kg/m.  GEN: Well nourished, well developed, in no acute distress. Neck: No JVD or carotid bruits. Cardiac:  RRR. No murmurs. No rubs or gallops.   Respiratory:  Respirations regular and  unlabored. Clear to auscultation without rales, wheezing or rhonchi. GI: Soft, nontender, nondistended. Extremities: Radials/DP/PT 2+ and equal bilaterally. No clubbing or cyanosis.  Mild, nonpitting edema bilateral feet and ankles. Skin: Warm and dry, no rash. Neuro: Strength intact.  Assessment & Plan    Lower extremity edema/DOE. Patient patient feels lower extremity edema is about the same.  On exam she has mild edema bilateral feet and ankles but none going up her leg as previously reported on 04/28/2023.  Home weight today was 248 lb and has been stable for the last couple of days.  She reports brisk diuresis on current dose of Lasix.  She admits to sitting dependently much of the day with occasionally propping legs up on a low stool.  She will try to elevate legs higher and attempt compression socks.  She reports shortness of breath only when she lays down at night on her back.  She wears CPAP.  DOE occurs with heavier exertion but not normal activities.  Continue Lasix at current dose.  BMP and BNP today. Chronic systolic heart failure/nonischemic cardiomyopathy. S/p ICD placement 2015 with gen change January 2023. Remote device check April 2024 showed normal function with stable battery and leads. Echo September 2022 EF 45-50%, moderate LVH, grade I DD. Patient with mild edema bilateral feet and ankles (see #1) GDMT limited secondary to patient's multiple drug intolerances. Continue irbesartan (see #3) and lasix.  Will get repeat echo.  Hypertension. BP today 158/82 on intake and 160/80 on the left arm and 150/80 on the right arm on my recheck.  Patient is concerned she is taking too much medicine at once and would like to split up the dose of irbesartan.  Is been taking irbesartan at night.  Patient is instructed to take half tablet of irbesartan in the morning and half in the evening.  She is instructed to keep upcoming visit with hypertension clinic.  She is unable to check BP at home secondary  to not having a cuff that fits her arm.  Disposition: BMP, BNP, echo.  Salty 6 information sheet.  Take irbesartan half  tablet in the a.m. half tablet in the p.m.  Keep visit with hypertension clinic.  Return in 3 months or sooner as needed.         Signed, Etta Grandchild. Elika Godar, DNP, NP-C

## 2023-05-01 ENCOUNTER — Other Ambulatory Visit: Payer: Self-pay | Admitting: Cardiology

## 2023-05-02 ENCOUNTER — Ambulatory Visit: Payer: 59 | Attending: Student | Admitting: Student

## 2023-05-02 ENCOUNTER — Encounter: Payer: Self-pay | Admitting: Student

## 2023-05-02 VITALS — BP 150/80 | HR 63 | Ht 64.0 in | Wt 252.0 lb

## 2023-05-02 DIAGNOSIS — I10 Essential (primary) hypertension: Secondary | ICD-10-CM

## 2023-05-02 DIAGNOSIS — I5022 Chronic systolic (congestive) heart failure: Secondary | ICD-10-CM

## 2023-05-02 DIAGNOSIS — R600 Localized edema: Secondary | ICD-10-CM

## 2023-05-02 DIAGNOSIS — Z79899 Other long term (current) drug therapy: Secondary | ICD-10-CM | POA: Diagnosis not present

## 2023-05-02 DIAGNOSIS — Z95810 Presence of automatic (implantable) cardiac defibrillator: Secondary | ICD-10-CM

## 2023-05-02 DIAGNOSIS — R0609 Other forms of dyspnea: Secondary | ICD-10-CM | POA: Diagnosis not present

## 2023-05-02 MED ORDER — IRBESARTAN 300 MG PO TABS: 150.0000 mg | ORAL_TABLET | Freq: Two times a day (BID) | ORAL | 6 refills | Status: DC

## 2023-05-02 NOTE — Patient Instructions (Signed)
Medication Instructions:  Your physician has recommended you make the following change in your medication:  DECREASE: Irbesartan to 0.5 tablet in the AM and 0.5 tablet in the PM  *If you need a refill on your cardiac medications before your next appointment, please call your pharmacy*   Lab Work: Your physician recommends that you have the following labs drawn today: BMET and BNP If you have labs (blood work) drawn today and your tests are completely normal, you will receive your results only by: MyChart Message (if you have MyChart) OR A paper copy in the mail If you have any lab test that is abnormal or we need to change your treatment, we will call you to review the results.   Testing/Procedures: Your physician has requested that you have an echocardiogram. Echocardiography is a painless test that uses sound waves to create images of your heart. It provides your doctor with information about the size and shape of your heart and how well your heart's chambers and valves are working. This procedure takes approximately one hour. There are no restrictions for this procedure. Please do NOT wear cologne, perfume, aftershave, or lotions (deodorant is allowed). Please arrive 15 minutes prior to your appointment time.    Follow-Up: At [facility], you and your health needs are our priority.  As part of our continuing mission to provide you with exceptional heart care, we have created designated Provider Care Teams.  These Care Teams include your primary Cardiologist (physician) and Advanced Practice Providers (APPs -  Physician Assistants and Nurse Practitioners) who all work together to provide you with the care you need, when you need it.  We recommend signing up for the patient portal called "MyChart".  Sign up information is provided on this After Visit Summary.  MyChart is used to connect with patients for Virtual Visits (Telemedicine).  Patients are able to view lab/test results,  encounter notes, upcoming appointments, etc.  Non-urgent messages can be sent to your provider as well.   To learn more about what you can do with MyChart, go to [email or web address].    Your next appointment:   3 month(s)  Provider:   Olga Millers, MD     Other Instructions

## 2023-05-03 LAB — BASIC METABOLIC PANEL
BUN/Creatinine Ratio: 18 (ref 12–28)
BUN: 18 mg/dL (ref 8–27)
CO2: 26 mmol/L (ref 20–29)
Calcium: 9.8 mg/dL (ref 8.7–10.3)
Chloride: 102 mmol/L (ref 96–106)
Creatinine, Ser: 1.01 mg/dL — ABNORMAL HIGH (ref 0.57–1.00)
Glucose: 100 mg/dL — ABNORMAL HIGH (ref 70–99)
Potassium: 4.4 mmol/L (ref 3.5–5.2)
Sodium: 142 mmol/L (ref 134–144)
eGFR: 62 mL/min/{1.73_m2} (ref >59)

## 2023-05-03 LAB — PRO B NATRIURETIC PEPTIDE: NT-Pro BNP: 317 pg/mL — ABNORMAL HIGH (ref 0–287)

## 2023-05-06 ENCOUNTER — Telehealth: Payer: Self-pay | Admitting: Cardiology

## 2023-05-06 NOTE — Telephone Encounter (Signed)
Pt c/o medication issue:  1. Name of Medication:   irbesartan (AVAPRO) 300 MG tablet    2. How are you currently taking this medication (dosage and times per day)? As written   3. Are you having a reaction (difficulty breathing--STAT)? no  4. What is your medication issue? Pt called in stating "I dont think this is the right medication for me. I dont know if its working because I don't have a bp cuff and I strong smell in my urine." Please advise.

## 2023-05-06 NOTE — Telephone Encounter (Signed)
Patient states since she started on irbesartan she has had  a burning during urination and her urine has a strong odor. She states she is still able to go to the bathroom. She also states she is not sure if irbesartan is working for her because she is not able to check her blood pressure at home. Advised patient to contact her pcp about the odor and burning during urination but I will forward to provider to see what he advises as well

## 2023-05-09 LAB — HM COLONOSCOPY

## 2023-05-12 ENCOUNTER — Telehealth: Payer: Self-pay | Admitting: Cardiology

## 2023-05-12 NOTE — Telephone Encounter (Signed)
Pt c/o medication issue:  1. Name of Medication:   irbesartan (AVAPRO) 300 MG tablet   2. How are you currently taking this medication (dosage and times per day)?   As prescribed  3. Are you having a reaction (difficulty breathing--STAT)?   Urine has strong odor and is burning her bottom  4. What is your medication issue?   Patient stated these symptoms started around 5/1 when she started this medication.  Patient wants call back for next steps.

## 2023-05-12 NOTE — Telephone Encounter (Signed)
Patient states she has been having burning during urination, her urine has a strong odor and her bottom burns. She states these symptoms started when she started taking irbesartan. I did advise usually that is not a side effect of the medication. Also advised her to contact her PCP for the her symptoms. She stated Dr. Ludwig Clarks nurse told her to cut the pill in half but she is still having the symptoms.

## 2023-05-13 NOTE — Progress Notes (Signed)
Remote ICD transmission.   

## 2023-05-14 ENCOUNTER — Ambulatory Visit
Admission: RE | Admit: 2023-05-14 | Discharge: 2023-05-14 | Disposition: A | Discharge status: Home / Self Care | Payer: 59 | Source: Ambulatory Visit

## 2023-05-14 DIAGNOSIS — Z1231 Encounter for screening mammogram for malignant neoplasm of breast: Secondary | ICD-10-CM

## 2023-05-15 ENCOUNTER — Ambulatory Visit (INDEPENDENT_AMBULATORY_CARE_PROVIDER_SITE_OTHER): Payer: 59 | Admitting: Medical-Surgical

## 2023-05-15 ENCOUNTER — Encounter: Payer: Self-pay | Admitting: Medical-Surgical

## 2023-05-15 VITALS — BP 195/76 | HR 66 | Resp 20 | Ht 64.0 in | Wt 250.1 lb

## 2023-05-15 DIAGNOSIS — L989 Disorder of the skin and subcutaneous tissue, unspecified: Secondary | ICD-10-CM

## 2023-05-15 DIAGNOSIS — R300 Dysuria: Secondary | ICD-10-CM | POA: Diagnosis not present

## 2023-05-15 DIAGNOSIS — Z7689 Persons encountering health services in other specified circumstances: Secondary | ICD-10-CM

## 2023-05-15 DIAGNOSIS — R8290 Unspecified abnormal findings in urine: Secondary | ICD-10-CM | POA: Diagnosis not present

## 2023-05-15 LAB — POCT URINALYSIS DIP (CLINITEK)
Bilirubin, UA: NEGATIVE
Glucose, UA: NEGATIVE mg/dL
Ketones, POC UA: NEGATIVE mg/dL
Leukocytes, UA: NEGATIVE
Nitrite, UA: POSITIVE — AB
POC PROTEIN,UA: NEGATIVE
Spec Grav, UA: 1.025 (ref 1.010–1.025)
Urobilinogen, UA: 0.2 E.U./dL
pH, UA: 6.5 (ref 5.0–8.0)

## 2023-05-15 LAB — WET PREP FOR TRICH, YEAST, CLUE
MICRO NUMBER:: 14995635
Specimen Quality: ADEQUATE

## 2023-05-15 MED ORDER — NITROFURANTOIN MONOHYD MACRO 100 MG PO CAPS: 100.0000 mg | ORAL_CAPSULE | Freq: Two times a day (BID) | ORAL | 0 refills | Status: DC

## 2023-05-15 MED ORDER — PHENAZOPYRIDINE HCL 200 MG PO TABS: 200.0000 mg | ORAL_TABLET | Freq: Three times a day (TID) | ORAL | 0 refills | Status: DC | PRN

## 2023-05-15 MED ORDER — FLUCONAZOLE 150 MG PO TABS: 150.0000 mg | ORAL_TABLET | Freq: Once | ORAL | 0 refills | Status: AC

## 2023-05-15 NOTE — Telephone Encounter (Signed)
Spoke with pt, she is going to see the PCP today for urine testing. She is not sure if the irbesartan is working or not because she has not been able to find a blood pressure cuff large enough for her arm but she continues to take it.

## 2023-05-15 NOTE — Progress Notes (Signed)
        Established patient visit  History, exam, impression, and plan:  1. Dysuria 2. Abnormal urine odor Pleasant 67 year old female presenting today with reports of burning with urination and an abnormal urine odor that has been present for approximately 2.5-3 weeks.  Has been working on adequate hygiene as well as pushing fluids but no improvement in symptoms.  The onset of her symptoms came when she started irbesartan and she wonders if this is responsible for what is going on.  Denies fever, chills, flank pain, nausea, vomiting, and gross hematuria.  POCT urinalysis completed today showing positive for trace intact blood as well as nitrites.  Sending for culture.  Given the change in odor and issues with recurrent BV, stat wet prep sent.  Treating empirically with Macrobid 100 mg twice daily x 5 days.  Adding Pyridium 200 mg 3 times daily as needed.  Although unlikely to cause a yeast infection, sending Diflucan for VVC prophylaxis. - POCT URINALYSIS DIP (CLINITEK) - Urine Culture - WET PREP FOR TRICH, YEAST, CLUE  3. Encounter for weight management She is very interested in weight loss efforts and would like to discuss her options.  She has multiple health concerns that we will limit our options when it comes to medications.  Advised her to contact her insurance company to see what antiobesity medications are covered and let me know so that we can review them for appropriateness.  4. Skin lesion She has a skin lesion on her left breast at the site of a previous stable placement that was completed due to an abnormal mammogram.  No itching, redness, or pain at the lesion but feels that it is cosmetically bothersome and would like to have it removed.  On evaluation, there is a slightly raised brown opioid lesion to the lateral left breast at the 3 o'clock position.  Reviewed options for treatment including cryotherapy versus shave biopsy.  She would like to proceed with shave biopsy.  She will  come back for this on a different day.  Procedures performed this visit: None.  Return for shave biopsy lesion removal at your convenience.  __________________________________ [name], DNP, APRN, FNP-BC Primary Care and Sports Medicine [facility] [place]

## 2023-05-16 ENCOUNTER — Telehealth: Payer: Self-pay | Admitting: Cardiology

## 2023-05-16 MED ORDER — METRONIDAZOLE 500 MG PO TABS: 500.0000 mg | ORAL_TABLET | Freq: Two times a day (BID) | ORAL | 0 refills | Status: AC

## 2023-05-16 MED ORDER — CLONIDINE HCL 0.1 MG PO TABS: 0.1000 mg | ORAL_TABLET | Freq: Every day | ORAL | 0 refills | Status: DC

## 2023-05-16 NOTE — Telephone Encounter (Signed)
Alice Stewart,  Will you please take a look at this? You have seen her before and she has a pending appointment with hypertension clinic Alice Shields, NP) on 6/6. Not sure what else to do for her at this point.  Thank you!  DW

## 2023-05-16 NOTE — Telephone Encounter (Signed)
Will you please contact the patient with the above recommendations from [place].  Thank you!  DW

## 2023-05-16 NOTE — Telephone Encounter (Signed)
Pt c/o BP issue: STAT if pt c/o blurred vision, one-sided weakness or slurred speech  1. What are your last 5 BP readings? 195/76 at last PCP appt yesterday   2. Are you having any other symptoms (ex. Dizziness, headache, blurred vision, passed out)? Slight dizziness   3. What is your BP issue? Patient is requesting a change in BP medication since her BP is not going down. Please advise.

## 2023-05-16 NOTE — Telephone Encounter (Signed)
Patient intolerant to many antihypertensives. Currently only on irbesartan 150mg  daily. Since she is having adverse effects recommend discontinuing. Has f/u with Gillian Shields in 2 weeks. Can try clonidine 0.1mg  in the evening, one of the few options remaining.

## 2023-05-16 NOTE — Addendum Note (Signed)
Addended byChristen Butter on: 05/16/2023 07:09 AM   Modules accepted: Orders

## 2023-05-16 NOTE — Telephone Encounter (Signed)
Patient stated she was prescribed irbesartan on 5/10, since then she has been experiencing dizziness, shortness of breath, headaches, and fatigue. Pt stated she had OV on 5/23 and her bp was 195/76, pt does not monitor her bp at home because she could not find a bp cuff to "fit her arm". Explained ED precautions pt voiced understanding. Will forward to APP and pharmD for advise.

## 2023-05-16 NOTE — Telephone Encounter (Signed)
Alice Stewart, [facility]      05/16/23  1:32 PM Note Patient intolerant to many antihypertensives. Currently only on irbesartan 150mg  daily. Since she is having adverse effects recommend discontinuing. Has f/u with Gillian Shields in 2 weeks. Can try clonidine 0.1mg  in the evening, one of the few options remaining.      Spoke with pt regarding [name]' recommendations. Pt is unsure if she has tried clonidine in the past and also mentions that she does not have a blood pressure cuff that fits her arm. I offered suggestions for where she can get a larger size blood pressure cuff. Sent prescription in to pt's preferred pharmacy. She is not sure if she is going to start medication or not. Advised pt to keep appointment with Gillian Shields, NP 6/6 at 9:15am. Pt verbalizes understanding.

## 2023-05-17 LAB — URINE CULTURE
MICRO NUMBER:: 14995820
SPECIMEN QUALITY:: ADEQUATE

## 2023-05-20 ENCOUNTER — Telehealth: Payer: Self-pay | Admitting: Cardiology

## 2023-05-20 DIAGNOSIS — I428 Other cardiomyopathies: Secondary | ICD-10-CM

## 2023-05-20 DIAGNOSIS — I160 Hypertensive urgency: Secondary | ICD-10-CM

## 2023-05-20 DIAGNOSIS — Z79899 Other long term (current) drug therapy: Secondary | ICD-10-CM

## 2023-05-20 DIAGNOSIS — I5042 Chronic combined systolic (congestive) and diastolic (congestive) heart failure: Secondary | ICD-10-CM

## 2023-05-20 NOTE — Telephone Encounter (Signed)
Patient states she weighs about 245-250 lbs. She would like to lose weight and has tried dieting and water pills, but nothing seems to help. She would like to know if her ICD will prohibit her from possibly having bariatric surgery.

## 2023-05-21 NOTE — Telephone Encounter (Signed)
Spoke with patient, informed her that the surgery its self would not likely interfere with the device. Patient was then asking questions about recovery from bariatric surgery and what all bariatric surgery entailed explained to patient that, as Device nurse I could not speak to that she would need to be referred to a bariatric provider for that information. Patient also stated she took Clonidine last night and it made her feel as if :" her whole body was throbbing" patient stated that she did not have a large BP cuff to take her BP at home. Patient would like information about the clonidine forwarded to Dr. Jens Som

## 2023-05-23 NOTE — Telephone Encounter (Addendum)
Called to inform patient of provider recommendations. Patient does not have a blood pressure cuff that fits her arm. She has not been able to check her blood pressure at home but she states she just feel like her blood pressure is all over the place and her blood pressure medications is not working. She will bring all her pill bottles to visit.

## 2023-05-23 NOTE — Telephone Encounter (Signed)
Prior intolerance to many medications which limits our options for additional BP meds.   Recommend giving medication at least a week prior to assess trend of BP and any side effects. Body throbbing would be very uncommon side effect of Clonidine.   Recommend she bring all her pill bottles and her blood pressure cuff to upcoming visit so we can ensure accuracy.   Alver Sorrow, NP

## 2023-05-23 NOTE — Telephone Encounter (Signed)
H&V Care Navigation CSW Progress Note  Clinical Social Worker contacted patient by phone to f/u regarding transportation. Pt has regularly taken [facility] Medicare transportation and also has been provided information about Medicaid and [facility] during previous consults for transportation.   I have called Medicaid Transportation for [employer] today, confirmed pt can utilize rides cross [employer], she will need to call and enroll in those services (due to no DPR they cannot let me know if she is already enrolled) and schedule rides 5 days in advance.   I was able to reach pt at [phone]. Re-introduced self, role, reason for call. Pt confirmed she has access to [facility] Medicare rides, she confirms she knows how to use them and hasn't had any issues lately. They can transport across [employer]. I also provided her again with Medicaid and [facility] [place] numbers and she shares that she knows I have sent that to her and that she received it. She can utilize any of these for a ride to appt next week. I also shared that her Medicaid plan should cover a bariatric cuff, she is hopeful this can be mailed to her.   LCSW has called [employer] [phone], left voicemail), regarding how to order bariatric cuff as they cover all [facility] for DME. I will let provider know when I have heard back to order.   Patient is participating in a Managed Medicaid Plan:  No, UHC Medicare, Medicaid [employer]  SDOH Screenings   Food Insecurity: No Food Insecurity (04/25/2023)  Recent Concern: Food Insecurity - Food Insecurity Present (01/27/2023)  Housing: Low Risk  (04/25/2023)  Transportation Needs: Unmet Transportation Needs (04/25/2023)  Utilities: Not At Risk (01/27/2023)  Alcohol Screen: Low Risk  (04/25/2023)  Depression (PHQ2-9): Low Risk  (02/25/2023)  Financial Resource Strain: Low Risk  (04/25/2023)  Physical Activity: Insufficiently Active (04/25/2023)  Social  Connections: Socially Isolated (04/25/2023)  Stress: No Stress Concern Present (04/25/2023)  Tobacco Use: Medium Risk (05/15/2023)   Octavio Graves, MSW, LCSW Clinical Social Worker II [facility] Health Heart/Vascular Care Navigation  [phone]- work cell phone (preferred) [phone]- desk phone

## 2023-05-23 NOTE — Telephone Encounter (Signed)
Thanks for calling her Demetris.   Thursday is her initial HTN Clinic visit. If she does not have a BP cuff at home, video visit is unfortunately inappropriate.   Will ask SW team to reach out to see if we can facilitate transportation. Will not be able to adequately address BP via video visit.   Alver Sorrow, NP

## 2023-05-26 ENCOUNTER — Other Ambulatory Visit: Payer: Self-pay

## 2023-05-26 ENCOUNTER — Encounter: Payer: Self-pay | Admitting: Medical-Surgical

## 2023-05-26 ENCOUNTER — Ambulatory Visit (INDEPENDENT_AMBULATORY_CARE_PROVIDER_SITE_OTHER): Payer: 59 | Admitting: Medical-Surgical

## 2023-05-26 VITALS — BP 187/81 | HR 71 | Resp 20 | Ht 64.0 in | Wt 250.1 lb

## 2023-05-26 DIAGNOSIS — R051 Acute cough: Secondary | ICD-10-CM | POA: Diagnosis not present

## 2023-05-26 DIAGNOSIS — L989 Disorder of the skin and subcutaneous tissue, unspecified: Secondary | ICD-10-CM | POA: Diagnosis not present

## 2023-05-26 DIAGNOSIS — L821 Other seborrheic keratosis: Secondary | ICD-10-CM | POA: Diagnosis not present

## 2023-05-26 MED ORDER — GUAIFENESIN-CODEINE 100-10 MG/5ML PO SYRP: 5.0000 mL | ORAL_SOLUTION | Freq: Three times a day (TID) | ORAL | 0 refills | Status: DC | PRN

## 2023-05-26 NOTE — Progress Notes (Signed)
        Established patient visit  History, exam, impression, and plan:  1. Skin lesion See below.   2. Acute cough Pleasant 67 year old female reporting several weeks of upper respiratory symptoms that have improved with the exception of a lingering productive cough.  Has been using Coricidin but this makes her drowsy and she is unable to use it during the day.  Had leftovers of a prescription of Robitussin with codeine which she reports has been helpful but she has run out of this.  She is requesting a refill.  Advised that this medication is not intended to be used long-term and that it is a controlled substance.  Okay with refilling a small quantity today.  If looking for over-the-counter medications safe for hypertensive patients that are nondrowsy, reviewed that there is an option for DayQuil that is made for patients with high blood pressure.   Procedures performed this visit: SUBJECTIVE:  Alice Stewart is a 67 y.o. female who presents for lesion removal. We have already discussed this procedure, including option of not performing surgery, technique of surgery and potential for scarring at a recent visit.  OBJECTIVE:  Patient appears well. Vitals are normal. Skin: lesion to the lateral left breast  ASSESSMENT:  suspicious lesion pigment even, raised, symmetrical, and non ulcerated size 4-5 mm noted on the lateral left breast  PLAN:  After informed consent was obtained, using chlorhexidine for cleansing and 1% Lidocaine without epinephrine for anesthetic, with sterile technique, shave excision was performed. Antibiotic dressing is applied, and wound care instructions provided.  Be alert for any signs of cutaneous infection. The procedure was well tolerated without complications. Follow up: the specimen is labeled and sent to pathology for evaluation, the patient may return prn.   Return if symptoms worsen or fail to improve.  __________________________________ [name],  DNP, APRN, FNP-BC Primary Care and Sports Medicine [facility] [place]

## 2023-05-26 NOTE — Addendum Note (Signed)
Addended byChristen Butter on: 05/26/2023 04:36 PM   Modules accepted: Orders

## 2023-05-26 NOTE — Telephone Encounter (Signed)
H&V Care Navigation CSW Progress Note  Clinical Social Worker  contacted [employer] and spoke with representative who shared that DME order can be faxed for bariatric cuff  to [phone], this is their e-fax and much more accessible. DME order should denote "bariatric" or "XL cuff".  I will route this is Luther Parody and [facility] Guys since pt has upcoming appt at [facility].  Patient is participating in a Managed Medicaid Plan:  No UHC Medicare and Medicaid  SDOH Screenings   Food Insecurity: No Food Insecurity (04/25/2023)  Recent Concern: Food Insecurity - Food Insecurity Present (01/27/2023)  Housing: Low Risk  (04/25/2023)  Transportation Needs: Unmet Transportation Needs (04/25/2023)  Utilities: Not At Risk (01/27/2023)  Alcohol Screen: Low Risk  (04/25/2023)  Depression (PHQ2-9): Low Risk  (02/25/2023)  Financial Resource Strain: Low Risk  (04/25/2023)  Physical Activity: Insufficiently Active (04/25/2023)  Social Connections: Socially Isolated (04/25/2023)  Stress: No Stress Concern Present (04/25/2023)  Tobacco Use: Medium Risk (05/26/2023)   Octavio Graves, MSW, LCSW Clinical Social Worker II [facility] Health Heart/Vascular Care Navigation  [phone]- work cell phone (preferred) [phone]- desk phone

## 2023-05-27 DIAGNOSIS — G4733 Obstructive sleep apnea (adult) (pediatric): Secondary | ICD-10-CM | POA: Diagnosis not present

## 2023-05-28 DIAGNOSIS — G4733 Obstructive sleep apnea (adult) (pediatric): Secondary | ICD-10-CM | POA: Diagnosis not present

## 2023-05-29 ENCOUNTER — Ambulatory Visit (INDEPENDENT_AMBULATORY_CARE_PROVIDER_SITE_OTHER): Payer: 59 | Admitting: Family

## 2023-05-29 ENCOUNTER — Encounter (HOSPITAL_BASED_OUTPATIENT_CLINIC_OR_DEPARTMENT_OTHER): Payer: Self-pay | Admitting: Family

## 2023-05-29 VITALS — BP 162/83 | HR 61 | Ht 64.0 in | Wt 250.0 lb

## 2023-05-29 DIAGNOSIS — E782 Mixed hyperlipidemia: Secondary | ICD-10-CM

## 2023-05-29 DIAGNOSIS — I10 Essential (primary) hypertension: Secondary | ICD-10-CM

## 2023-05-29 DIAGNOSIS — I428 Other cardiomyopathies: Secondary | ICD-10-CM | POA: Diagnosis not present

## 2023-05-29 DIAGNOSIS — Z79899 Other long term (current) drug therapy: Secondary | ICD-10-CM

## 2023-05-29 DIAGNOSIS — I5042 Chronic combined systolic (congestive) and diastolic (congestive) heart failure: Secondary | ICD-10-CM | POA: Diagnosis not present

## 2023-05-29 DIAGNOSIS — Z95810 Presence of automatic (implantable) cardiac defibrillator: Secondary | ICD-10-CM | POA: Diagnosis not present

## 2023-05-29 DIAGNOSIS — G4733 Obstructive sleep apnea (adult) (pediatric): Secondary | ICD-10-CM | POA: Diagnosis not present

## 2023-05-29 DIAGNOSIS — I160 Hypertensive urgency: Secondary | ICD-10-CM | POA: Diagnosis not present

## 2023-05-29 MED ORDER — BLOOD PRESSURE KIT: 1.0000 | PACK | Freq: Every day | 0 refills | Status: DC

## 2023-05-29 MED ORDER — BLOOD PRESSURE KIT
1.0000 | PACK | Freq: Every day | 0 refills | Status: AC
Start: 2023-05-29 — End: ?

## 2023-05-29 NOTE — Patient Instructions (Addendum)
Medication Instructions:  Your physician recommends that you continue on your current medications as directed. Please refer to the Current Medication list given to you today.    Labwork: Your physician recommends that you return for lab work today- BMP, Thyroid Panel    Testing/Procedures: Your physician has requested that you have a renal artery duplex. During this test, an ultrasound is used to evaluate blood flow to the kidneys. Allow one hour for this exam. Do not eat after midnight the day before and avoid carbonated beverages. Take your medications as you usually do.    Follow-Up: 6-8 weeks HTN Clinic with Gillian Shields, NP

## 2023-05-29 NOTE — Addendum Note (Signed)
Addended by: Marlene Lard on: 05/29/2023 04:55 PM   Modules accepted: Orders

## 2023-05-29 NOTE — Telephone Encounter (Signed)
DME order for bariatric blood pressure cuff faxed.

## 2023-05-29 NOTE — Progress Notes (Addendum)
[facility] Initial Assessment:    Date:  05/29/2023   ID:  Alice Stewart, DOB Jun 02, 1956, MRN [id number]  PCP:  Christen Butter, NP  Cardiologist:  Olga Millers, MD  Nephrologist:  Referring MD: Christen Butter, NP   CC: Hypertension  History of Present Illness:    Alice Stewart is a 67 y.o. female with a hx of chronic systolic and diastolic heart failure with nonischemic cardiomyopathy, s/p CRT-D (placed 2015 with gen change 12/2021), PVC, hypertension, hyperlipidemia, migraine, hypothyroidism, sleep apnea here to establish care in the Advanced [facility]pertension Clinic.   Prior CT abdomen pelvis 07/2021 with normal adrenal glands.  Labs 4/23 normal thyroid function.  CTA head/neck 06/2021 with bilateral no cyst occlusion or stenosis. SHe ahd echo upcoming later this month.  Previously wore 24-hour blood pressure monitor 04/15/2023 with overall average blood pressure 205/85.  During waking hours 201/85, sleeping 208/85.  Most recently pharmacy team recommended Clonidine 0.1mg  QHS due to multiple prior intolerances which are detailed below.   Alice Stewart was diagnosed with hypertension when she was 67 years old. It has been difficult to control due to medication intolerances. Feels she tolerates medications for awhile then develops side effects. Blood pressure not checked routinely at home.  she reports tobacco use previously having quite 09/2004. Alcohol use never. For exercise she walks two days per week depending on the weather. she eats at home and outside of the home and does follow low sodium diet. She endorses drinking up to three 8 oz cups of coffee per day. Otherwise drinks water.   Tells me clonidine made her feel more anxious, jittery. She took the Clonidine for about a week. She started back on Losartan 100mg  QD independently yesterday. She reports no longer tolerating Irbesartan "can't remember it all" but reports headache, sore throat. Has had a cold recently  which she attributes to her blood pressure medications. Discussed this is unlikely side effect of antihypertensive agents.   She has FM and arthritis - does take Tylenol regularly. Takes flexeril only sparingly. Icy hot, heat pack, massage. Does not take NSAIDS regularly.   Previous antihypertensives: Bystolic - nausea Clonidine - "bad side effects" Carvedilol - swelling Amlodipine-atorvastatin - salty taste, stomach burning, headache Chlorthalidone - headache Diltiazem - dizziness Lisinopril - backache Metoprolol tartrate - nausea, headache, dizziness Spironolactone - diarrhea Hydralazine - chest heaviness, SOB, dizziness, unable to sleep after 1 tablet Labetolol - side effects  Doxazosin - did not tolerate Entresto - dizziness Valsartan - dizziness, memory issues, knee pain, fatigue Losartan HCTZ - ankle swelling Chlorthalidone - nausea  Past Medical History:  Diagnosis Date   Anemia    Back pain    Benign positional vertigo    Cardiomyopathy (HCC)    Carpal tunnel syndrome    Goiter    nontoxid mutinodular   Hypertension    Hyperthyroidism    Insulin resistance syndrome    Menopausal disorder    Menorrhagia    Migraine    Muscle spasm    trapezius   Neck pain, acute    Obesity    Seborrheic dermatitis    Thyroid disorder     Past Surgical History:  Procedure Laterality Date   CHOLECYSTECTOMY     [place]   DILATION AND CURETTAGE OF UTERUS  03/23/2008   ENDOMETRIAL ABLATION  03/23/2008   GREAT TOE ARTHRODESIS, METATARSALPHALANGEAL JOINT  05/23/2006   Rt foot   HEEL SPUR SURGERY  10/23/1986   Lt foot   ICD  GENERATOR CHANGEOUT N/A 01/18/2022   Procedure: ICD GENERATOR CHANGEOUT;  Surgeon: Regan Lemming, MD;  Location: [facility] INVASIVE CV LAB;  Service: Cardiovascular;  Laterality: N/A;   TUBAL LIGATION  10/23/1989    Current Medications: Current Meds  Medication Sig   Ascorbic Acid (VITAMIN C) 1000 MG tablet Take 1,000 mg by mouth daily.      aspirin 81 MG EC tablet Take 81 mg by mouth daily.   Bioflavonoid Products (BIOFLEX PO) Take 2 tablets by mouth daily.   Biotin 27253 MCG TABS Take 10,000 mcg by mouth daily.   Calcium Carb-Cholecalciferol 600-20 MG-MCG TABS Take 1 tablet by mouth daily.   COLLAGEN PO Take 3 capsules by mouth daily.   cyclobenzaprine (FLEXERIL) 10 MG tablet Take 0.5-1 tablets (5-10 mg total) by mouth 3 (three) times daily as needed for muscle spasms. Caution: can cause drowsiness   ferrous sulfate 325 (65 FE) MG tablet Take 325 mg by mouth every other day.   flecainide (TAMBOCOR) 100 MG tablet Take 1 tablet (100 mg total) by mouth 2 (two) times daily.   Fluocinolone Acetonide Scalp (DERMA-SMOOTHE/FS SCALP) 0.01 % OIL Apply 1 application topically daily.   furosemide (LASIX) 20 MG tablet Take 2 tablets (40 mg total) by mouth daily.   guaiFENesin-codeine (ROBITUSSIN AC) 100-10 MG/5ML syrup Take 5 mLs by mouth 3 (three) times daily as needed for cough.   Lactobacillus (PROBIOTIC ACIDOPHILUS PO) Take by mouth.   losartan (COZAAR) 100 MG tablet Take 100 mg by mouth daily.   Multiple Minerals-Vitamins (GNP CAL MAG ZINC +D3 PO) Take 3 tablets by mouth daily.   Multiple Vitamin (MULTIVITAMIN) tablet Take 1 tablet by mouth daily.     NON FORMULARY Take by mouth daily. 2 tablespoons olive oil   Red Clover Leaf Extract 500 MG TABS Take 500 mg by mouth daily.   vitamin B-12 (CYANOCOBALAMIN) 1000 MCG tablet Take 1,000 mcg by mouth daily.   vitamin E 180 MG (400 UNITS) capsule Take 400 Units by mouth daily.     Allergies:   Hydralazine, Amoxicillin, Duloxetine, Gabapentin, Latex, Bystolic [nebivolol hcl], Clonidine derivatives, Coreg [carvedilol], Estradiol, Amlodipine-atorvastatin, Chlorthalidone, Diltiazem hcl, Lisinopril, Metoprolol tartrate, Simvastatin, and Spironolactone   Social History   Socioeconomic History   Marital status: Divorced    Spouse name: Not on file   Number of children: 3   Years of education:  14   Highest education level: Some college, no degree  Occupational History   Occupation: unemployed    Employer: Statistician   Occupation: legally disabled  Tobacco Use   Smoking status: Former    Types: Cigarettes    Quit date: 09/22/2004    Years since quitting: 18.6   Smokeless tobacco: Never  Vaping Use   Vaping Use: Never used  Substance and Sexual Activity   Alcohol use: Not Currently    Alcohol/week: 1.0 standard drink of alcohol    Types: 1 Standard drinks or equivalent per week    Comment: 1 weekly   Drug use: No   Sexual activity: Yes    Birth control/protection: Post-menopausal  Other Topics Concern   Not on file  Social History Narrative   Lives alone. She has three children. She enjoys watching television, cooking, baking and sewing.   Social Determinants of Health   Financial Resource Strain: Low Risk  (04/25/2023)   Overall Financial Resource Strain (CARDIA)    Difficulty of Paying Living Expenses: Not hard at all  Food Insecurity: No Food Insecurity (04/25/2023)  Hunger Vital Sign    Worried About Running Out of Food in the Last Year: Never true    Ran Out of Food in the Last Year: Never true  Recent Concern: Food Insecurity - Food Insecurity Present (01/27/2023)   Hunger Vital Sign    Worried About Running Out of Food in the Last Year: Sometimes true    Ran Out of Food in the Last Year: Never true  Transportation Needs: Unmet Transportation Needs (04/25/2023)   PRAPARE - Administrator, Civil Service (Medical): Yes    Lack of Transportation (Non-Medical): Yes  Physical Activity: Insufficiently Active (04/25/2023)   Exercise Vital Sign    Days of Exercise per Week: 2 days    Minutes of Exercise per Session: 60 min  Stress: No Stress Concern Present (04/25/2023)   [employer] of Occupational Health - Occupational Stress Questionnaire    Feeling of Stress : Not at all  Social Connections: Socially Isolated (04/25/2023)   Social Connection and  Isolation Panel [NHANES]    Frequency of Communication with Friends and Family: More than three times a week    Frequency of Social Gatherings with Friends and Family: Once a week    Attends Religious Services: Never    Database administrator or Organizations: No    Attends Engineer, structural: Never    Marital Status: Divorced     Family History: The patient's family history includes Diabetes in an other family member; Hypertension in her father and sister; Hypothyroidism in her sister; Other in her mother.  ROS:   Please see the history of present illness.     All other systems reviewed and are negative.  EKGs/Labs/Other Studies Reviewed:    EKG:  EKG is not ordered today.    Recent Labs: 05/02/2023: BUN 18; Creatinine, Ser 1.01; NT-Pro BNP 317; Potassium 4.4; Sodium 142   Recent Lipid Panel    Component Value Date/Time   CHOL 225 (H) 04/11/2022 1601   TRIG 173 (H) 04/11/2022 1601   HDL 49 (L) 04/11/2022 1601   CHOLHDL 4.6 04/11/2022 1601   VLDL 22 03/11/2007 1943   LDLCALC 145 (H) 04/11/2022 1601    Physical Exam:   VS:  BP (!) 162/83 Comment: right arm  Pulse 61   Ht 5\' 4"  (1.626 m)   Wt 250 lb (113.4 kg)   LMP 09/27/2011   BMI 42.91 kg/m  , BMI Body mass index is 42.91 kg/m. GENERAL:  Well appearing, overweight HEENT: Pupils equal round and reactive, fundi not visualized, oral mucosa unremarkable NECK:  No jugular venous distention, waveform within normal limits, carotid upstroke brisk and symmetric, no bruits, no thyromegaly LYMPHATICS:  No cervical adenopathy LUNGS:  Clear to auscultation bilaterally HEART:  RRR.  PMI not displaced or sustained,S1 and S2 within normal limits, no S3, no S4, no clicks, no rubs, no murmurs ABD:  Flat, positive bowel sounds normal in frequency in pitch, no bruits, no rebound, no guarding, no midline pulsatile mass, no hepatomegaly, no splenomegaly EXT:  2 plus pulses throughout, no edema, no cyanosis no clubbing SKIN:   No rashes no nodules NEURO:  Cranial nerves II through XII grossly intact, motor grossly intact throughout PSYCH:  Cognitively intact, oriented to person place and time   ASSESSMENT/PLAN:    HTN - BP not at goal <130/80. Complicated by multiple prior intolerances. Discussed need to try new medications for at least one week prior to determining efficacy. Also discussed unusual to tolerate  mediation for months then suddenly not tolerate. She self resumed Losartan 100mg  QD two days ago, as she reports tolerating well will continue same. Unfortunately we are very limited in future options due to multiple prior intolerances.  Continue losartan 100 mg daily.  Check in via MyChart message in 1 week.  Future considerations include: Chlorthalidone (12.5mg  QD) or Minoxidil (2.5 mg QHS)  Plan for renal duplex to rule out significant renal artery stenosis.  If no significant stenosis, consider renal denervation.  We initiated discussion she was provided pamphlet today. Defer renin aldosterone level as would have to hold Losartan and previously did not tolerate Spironolactone.  BMP, thyroid panel today. Unable to participate in research study as BP cuff did not read accurate on her arm. However, coordinated with SW team and XL arm cuff to be delivered to her home for home monitoring of BP.  HLD - intolerance to statins.   Combined systolic/diastolic heart failure / s/p CRT-D - Echo 06/03/23 upcoming. Management per general cardiology team.   OSA - Wears CPAP regularly. CPAP compliance encouraged.   Screening for Secondary Hypertension:     05/29/2023    1:21 PM  Causes  Drugs/Herbals Screened     - Comments 05/29/23 encouraged to stop OTC agents  Renovascular HTN Screened     - Comments 05/29/23 renal duplex ordered  Sleep Apnea Screened     - Comments wearing CPAP  Thyroid Disease Screened     - Comments 05/29/23 TSH ordered  Pheochromocytoma Screened     - Comments Prior CT 2022 normal adrenals   Coarctation of the Aorta Screened     - Comments 03/2022 CT no significant carotid stenosis  Compliance Screened     - Comments History of self-adjusting medications    Relevant Labs/Studies:    Latest Ref Rng & Units 05/02/2023    4:18 PM 04/25/2023   11:33 AM 12/04/2022    2:13 PM  Basic Labs  Sodium 134 - 144 mmol/L 142  143  144   Potassium 3.5 - 5.2 mmol/L 4.4  4.2  3.8   Creatinine 0.57 - 1.00 mg/dL 1.61  0.96  0.45        Latest Ref Rng & Units 04/11/2022    4:01 PM 10/08/2011   12:10 PM  Thyroid   TSH 0.40 - 4.50 mIU/L 1.89  1.77                 05/29/2023   10:06 AM  Renovascular   Renal Artery [place] Completed Yes      Disposition:    FU with MD/PharmD in 6-8 weeks    Medication Adjustments/Labs and Tests Ordered: Current medicines are reviewed at length with the patient today.  Concerns regarding medicines are outlined above.  Orders Placed This Encounter  Procedures   Basic metabolic panel   Thyroid Panel With TSH   VAS US RENAL ARTERY DUPLEX   No orders of the defined types were placed in this encounter.    Signed, Alver Sorrow, NP  05/29/2023 1:23 PM    [facility]

## 2023-05-29 NOTE — Addendum Note (Signed)
Addended by: Marlene Lard on: 05/29/2023 10:54 AM   Modules accepted: Orders

## 2023-05-30 LAB — BASIC METABOLIC PANEL
BUN/Creatinine Ratio: 13 (ref 12–28)
BUN: 12 mg/dL (ref 8–27)
CO2: 28 mmol/L (ref 20–29)
Calcium: 9.8 mg/dL (ref 8.7–10.3)
Chloride: 104 mmol/L (ref 96–106)
Creatinine, Ser: 0.92 mg/dL (ref 0.57–1.00)
Glucose: 110 mg/dL — ABNORMAL HIGH (ref 70–99)
Potassium: 4.6 mmol/L (ref 3.5–5.2)
Sodium: 146 mmol/L — ABNORMAL HIGH (ref 134–144)
eGFR: 70 mL/min/{1.73_m2} (ref >59)

## 2023-05-30 LAB — THYROID PANEL WITH TSH
Free Thyroxine Index: 1.7 (ref 1.2–4.9)
T3 Uptake Ratio: 26 % (ref 24–39)
T4, Total: 6.7 ug/dL (ref 4.5–12.0)
TSH: 3.43 u[IU]/mL (ref 0.450–4.500)

## 2023-06-03 ENCOUNTER — Ambulatory Visit (HOSPITAL_COMMUNITY): Payer: 59 | Attending: Cardiology

## 2023-06-03 DIAGNOSIS — R0609 Other forms of dyspnea: Secondary | ICD-10-CM | POA: Diagnosis not present

## 2023-06-03 LAB — ECHOCARDIOGRAM COMPLETE
Area-P 1/2: 3.6 cm2
MV M vel: 5.19 m/s
MV Peak grad: 107.7 mmHg
P 1/2 time: 346 msec
Radius: 0.6 cm
S' Lateral: 4.6 cm

## 2023-06-04 ENCOUNTER — Telehealth: Payer: Self-pay | Admitting: Licensed Clinical Social Worker

## 2023-06-04 NOTE — Telephone Encounter (Signed)
H&V Care Navigation CSW Progress Note  Clinical Social Worker contacted patient by phone to f/u on BP cuff- was able to reach her this afternoon at [phone]. Pt states she checks mail regularly but hasn't received it yet. I provided [employer] number for her to f/u and ensure it was mailed. Order had been placed for XL/bariatric cuff on 6/6/, initial order did not have qualifying dx but it was approved due to other dx. Encouraged pt to call us if any issues.  Patient is participating in a Managed Medicaid Plan:  No, UHC Medicare and Medicaid  SDOH Screenings   Food Insecurity: No Food Insecurity (04/25/2023)  Recent Concern: Food Insecurity - Food Insecurity Present (01/27/2023)  Housing: Low Risk  (04/25/2023)  Transportation Needs: Unmet Transportation Needs (04/25/2023)  Utilities: Not At Risk (01/27/2023)  Alcohol Screen: Low Risk  (04/25/2023)  Depression (PHQ2-9): Low Risk  (02/25/2023)  Financial Resource Strain: Low Risk  (04/25/2023)  Physical Activity: Insufficiently Active (04/25/2023)  Social Connections: Socially Isolated (04/25/2023)  Stress: No Stress Concern Present (04/25/2023)  Tobacco Use: Medium Risk (05/29/2023)   Octavio Graves, MSW, LCSW Clinical Social Worker II [facility] Heart/Vascular Care Navigation  [phone]- work cell phone (preferred) [phone]- desk phone

## 2023-06-11 NOTE — Progress Notes (Signed)
HPI: FU CM.  Previously followed by Mayra Reel, MD in [place]. She has a history of echocardiogram in 2014 showed ejection fraction 25 to 30% with left bundle branch block.  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  She had CRT-D in 2015.  CTA in July 2021 showed minimal nonobstructive coronary disease with calcium score 34.  Echocardiogram repeated May 2021 showed ejection fraction 55 to 60%.  Her PVCs are now treated with flecainide. Patient previously moved back to this area from [place] state.  Follow-up echocardiogram here September 2022 showed ejection fraction 45 to 50%, moderate left ventricular hypertrophy, grade 1 diastolic dysfunction, mild mitral regurgitation. Patient had ICD generator change January 2023.  Note by report patient did not tolerate carvedilol, Toprol, Entresto, spironolactone, Cardura and recently discontinued labetalol.  She is intolerant to statins.  Sleep study 12/23 with moderate OSA and O2 sats as low as 76. Echo 6/24 showed EF 40-45, mild LVH, grade 2 DD, moderate LAE, mild MR, moderate AI and dilated ascending aorta 39 mm.  Patient has not been seen in the hypertension clinic and renal Dopplers scheduled.  If no significant stenosis renal denervation will be considered.  Since last seen she denies dyspnea, chest pain, palpitations or syncope.  Her blood pressure continues to run high.  Current Outpatient Medications  Medication Sig Dispense Refill   acetaminophen (TYLENOL) 325 MG tablet Take 650 mg by mouth every 6 (six) hours as needed.     Ascorbic Acid (VITAMIN C) 1000 MG tablet Take 1,000 mg by mouth daily.       aspirin 81 MG EC tablet Take 81 mg by mouth daily.     Bioflavonoid Products (BIOFLEX PO) Take 2 tablets by mouth daily.     Biotin 11914 MCG TABS Take 10,000 mcg by mouth daily.     Blood Pressure KIT 1 kit by Does not apply route daily. 1 kit 0   Calcium Carb-Cholecalciferol 600-20 MG-MCG TABS Take 1  tablet by mouth daily.     COLLAGEN PO Take 3 capsules by mouth daily.     cyclobenzaprine (FLEXERIL) 10 MG tablet Take 0.5-1 tablets (5-10 mg total) by mouth 3 (three) times daily as needed for muscle spasms. Caution: can cause drowsiness 60 tablet 1   ferrous sulfate 325 (65 FE) MG tablet Take 325 mg by mouth every other day.     flecainide (TAMBOCOR) 100 MG tablet Take 1 tablet (100 mg total) by mouth 2 (two) times daily. 180 tablet 3   Fluocinolone Acetonide Scalp (DERMA-SMOOTHE/FS SCALP) 0.01 % OIL Apply 1 application topically daily. 118.28 mL 0   furosemide (LASIX) 20 MG tablet Take 2 tablets (40 mg total) by mouth daily. 180 tablet 3   guaiFENesin-codeine (ROBITUSSIN AC) 100-10 MG/5ML syrup Take 5 mLs by mouth 3 (three) times daily as needed for cough. 75 mL 0   Lactobacillus (PROBIOTIC ACIDOPHILUS PO) Take by mouth.     losartan (COZAAR) 100 MG tablet Take 100 mg by mouth daily.     minoxidil (LONITEN) 2.5 MG tablet Take 1 tablet (2.5 mg total) by mouth daily. 30 tablet 0   Multiple Minerals-Vitamins (GNP CAL MAG ZINC +D3 PO) Take 3 tablets by mouth daily.     Multiple Vitamin (MULTIVITAMIN) tablet Take 1 tablet by mouth daily.       NON FORMULARY Take by mouth daily. 2 tablespoons olive oil     Red Clover Leaf Extract 500 MG  TABS Take 500 mg by mouth daily.     vitamin B-12 (CYANOCOBALAMIN) 1000 MCG tablet Take 1,000 mcg by mouth daily.     vitamin E 180 MG (400 UNITS) capsule Take 400 Units by mouth daily.     No current facility-administered medications for this visit.     Past Medical History:  Diagnosis Date   Anemia    Back pain    Benign positional vertigo    Cardiomyopathy (HCC)    Carpal tunnel syndrome    Goiter    nontoxid mutinodular   Hypertension    Hyperthyroidism    Insulin resistance syndrome    Menopausal disorder    Menorrhagia    Migraine    Muscle spasm    trapezius   Neck pain, acute    Obesity    Seborrheic dermatitis    Thyroid disorder      Past Surgical History:  Procedure Laterality Date   CHOLECYSTECTOMY     [place]   DILATION AND CURETTAGE OF UTERUS  03/23/2008   ENDOMETRIAL ABLATION  03/23/2008   GREAT TOE ARTHRODESIS, METATARSALPHALANGEAL JOINT  05/23/2006   Rt foot   HEEL SPUR SURGERY  10/23/1986   Lt foot   ICD GENERATOR CHANGEOUT N/A 01/18/2022   Procedure: ICD GENERATOR CHANGEOUT;  Surgeon: Regan Lemming, MD;  Location: [facility] INVASIVE CV LAB;  Service: Cardiovascular;  Laterality: N/A;   TUBAL LIGATION  10/23/1989    Social History   Socioeconomic History   Marital status: Divorced    Spouse name: Not on file   Number of children: 3   Years of education: 14   Highest education level: Some college, no degree  Occupational History   Occupation: unemployed    Employer: Statistician   Occupation: legally disabled  Tobacco Use   Smoking status: Former    Types: Cigarettes    Quit date: 09/22/2004    Years since quitting: 18.7   Smokeless tobacco: Never  Vaping Use   Vaping Use: Never used  Substance and Sexual Activity   Alcohol use: Not Currently    Alcohol/week: 1.0 standard drink of alcohol    Types: 1 Standard drinks or equivalent per week    Comment: 1 weekly   Drug use: No   Sexual activity: Yes    Birth control/protection: Post-menopausal  Other Topics Concern   Not on file  Social History Narrative   Lives alone. She has three children. She enjoys watching television, cooking, baking and sewing.   Social Determinants of Health   Financial Resource Strain: Low Risk  (04/25/2023)   Overall Financial Resource Strain (CARDIA)    Difficulty of Paying Living Expenses: Not hard at all  Food Insecurity: No Food Insecurity (04/25/2023)   Hunger Vital Sign    Worried About Running Out of Food in the Last Year: Never true    Ran Out of Food in the Last Year: Never true  Recent Concern: Food Insecurity - Food Insecurity Present (01/27/2023)   Hunger Vital Sign    Worried About Running Out of  Food in the Last Year: Sometimes true    Ran Out of Food in the Last Year: Never true  Transportation Needs: Unmet Transportation Needs (06/16/2023)   PRAPARE - Transportation    Lack of Transportation (Medical): Yes    Lack of Transportation (Non-Medical): Yes  Physical Activity: Insufficiently Active (04/25/2023)   Exercise Vital Sign    Days of Exercise per Week: 2 days    Minutes of Exercise  per Session: 60 min  Stress: No Stress Concern Present (04/25/2023)   [employer] of Occupational Health - Occupational Stress Questionnaire    Feeling of Stress : Not at all  Social Connections: Socially Isolated (04/25/2023)   Social Connection and Isolation Panel [NHANES]    Frequency of Communication with Friends and Family: More than three times a week    Frequency of Social Gatherings with Friends and Family: Once a week    Attends Religious Services: Never    Database administrator or Organizations: No    Attends Banker Meetings: Never    Marital Status: Divorced  Catering manager Violence: Not At Risk (01/31/2023)   Humiliation, Afraid, Rape, and Kick questionnaire    Fear of Current or Ex-Partner: No    Emotionally Abused: No    Physically Abused: No    Sexually Abused: No    Family History  Problem Relation Age of Onset   Other Mother        MS   Hypertension Father    Hypothyroidism Sister    Hypertension Sister    Diabetes Other     ROS: no fevers or chills, productive cough, hemoptysis, dysphasia, odynophagia, melena, hematochezia, dysuria, hematuria, rash, seizure activity, orthopnea, PND, pedal edema, claudication. Remaining systems are negative.  Physical Exam: Well-developed well-nourished in no acute distress.  Skin is warm and dry.  HEENT is normal.  Neck is supple.  Chest is clear to auscultation with normal expansion.  Cardiovascular exam is regular rate and rhythm.  2/6 systolic murmur left sternal border. Abdominal exam nontender or distended.  No masses palpated. Extremities show no edema. neuro grossly intact   A/P  1 nonischemic cardiomyopathy-we will continue ARB.  She previously did not tolerate ACE inhibitor, Entresto or any beta-blocker.  2 hypertension-multiple medication intolerances as outlined in previous notes including all beta-blockers, Entresto, amlodipine, Cardizem, clonidine, hydralazine and Cardura. She has been seen in the hypertension clinic and potential renal denervation is planned if renal Dopplers are negative.  I will increase minoxidil to 5 mg daily at present.  We discussed potential admission but she is concerned about the expense.   3 CRT-D-managed by Dr. Elberta Fortis.  4 history of elevated calcium score-patient is intolerant to statins.  Will continue aspirin.  5 hyperlipidemia-intolerant to statins.  Check lipids.  Would consider addition of Zetia or PCSK9 inhibitor if elevated though she has difficulty tolerating any medications.  6 history of PVCs-LV function is mildly reduced.  Will continue flecainide for now as she does not have a history of obstructive coronary disease.  However if her LV function decreases further would favor discontinuing.  7 obstructive sleep apnea-continue CPAP.  Very difficult situation.  She is intolerant of most medications.  I have explained that there are not a lot of of additional options for treating her hypertension.  Olga Millers, MD

## 2023-06-12 ENCOUNTER — Telehealth: Payer: 59 | Admitting: Cardiology

## 2023-06-13 ENCOUNTER — Other Ambulatory Visit: Payer: Self-pay

## 2023-06-13 ENCOUNTER — Ambulatory Visit: Admission: EM | Admit: 2023-06-13 | Discharge: 2023-06-13 | Discharge status: Against Medical Advice | Payer: 59

## 2023-06-13 ENCOUNTER — Emergency Department (HOSPITAL_COMMUNITY): Payer: 59

## 2023-06-13 ENCOUNTER — Emergency Department (HOSPITAL_COMMUNITY)
Admission: EM | Admit: 2023-06-13 | Discharge: 2023-06-13 | Disposition: A | Discharge status: Home / Self Care | Payer: 59 | Attending: Emergency Medicine | Admitting: Emergency Medicine

## 2023-06-13 ENCOUNTER — Ambulatory Visit: Payer: 59 | Attending: Cardiology | Admitting: Cardiology

## 2023-06-13 ENCOUNTER — Other Ambulatory Visit: Payer: Self-pay | Admitting: Cardiology

## 2023-06-13 ENCOUNTER — Telehealth: Payer: Self-pay | Admitting: Cardiology

## 2023-06-13 ENCOUNTER — Encounter (HOSPITAL_COMMUNITY): Payer: Self-pay

## 2023-06-13 ENCOUNTER — Encounter: Payer: Self-pay | Admitting: Cardiology

## 2023-06-13 VITALS — BP 203/86 | HR 60 | Ht 64.0 in | Wt 245.4 lb

## 2023-06-13 DIAGNOSIS — Z91040 Latex allergy status: Secondary | ICD-10-CM | POA: Diagnosis not present

## 2023-06-13 DIAGNOSIS — G4733 Obstructive sleep apnea (adult) (pediatric): Secondary | ICD-10-CM

## 2023-06-13 DIAGNOSIS — R519 Headache, unspecified: Secondary | ICD-10-CM | POA: Diagnosis present

## 2023-06-13 DIAGNOSIS — R079 Chest pain, unspecified: Secondary | ICD-10-CM | POA: Diagnosis not present

## 2023-06-13 DIAGNOSIS — I10 Essential (primary) hypertension: Secondary | ICD-10-CM

## 2023-06-13 DIAGNOSIS — R42 Dizziness and giddiness: Secondary | ICD-10-CM | POA: Diagnosis not present

## 2023-06-13 DIAGNOSIS — Z79899 Other long term (current) drug therapy: Secondary | ICD-10-CM | POA: Diagnosis not present

## 2023-06-13 DIAGNOSIS — Z7982 Long term (current) use of aspirin: Secondary | ICD-10-CM | POA: Insufficient documentation

## 2023-06-13 DIAGNOSIS — Z950 Presence of cardiac pacemaker: Secondary | ICD-10-CM | POA: Diagnosis not present

## 2023-06-13 DIAGNOSIS — I517 Cardiomegaly: Secondary | ICD-10-CM | POA: Diagnosis not present

## 2023-06-13 LAB — CBC
HCT: 41 % (ref 36.0–46.0)
Hemoglobin: 13.7 g/dL (ref 12.0–15.0)
MCH: 31.5 pg (ref 26.0–34.0)
MCHC: 33.4 g/dL (ref 30.0–36.0)
MCV: 94.3 fL (ref 80.0–100.0)
Platelets: 292 10*3/uL (ref 150–400)
RBC: 4.35 MIL/uL (ref 3.87–5.11)
RDW: 12.5 % (ref 11.5–15.5)
WBC: 5.4 10*3/uL (ref 4.0–10.5)
nRBC: 0 % (ref 0.0–0.2)

## 2023-06-13 LAB — BASIC METABOLIC PANEL
Anion gap: 7 (ref 5–15)
BUN: 14 mg/dL (ref 8–23)
CO2: 30 mmol/L (ref 22–32)
Calcium: 9.6 mg/dL (ref 8.9–10.3)
Chloride: 104 mmol/L (ref 98–111)
Creatinine, Ser: 0.94 mg/dL (ref 0.44–1.00)
GFR, Estimated: >60 mL/min (ref >60)
Glucose, Bld: 112 mg/dL — ABNORMAL HIGH (ref 70–99)
Potassium: 3.8 mmol/L (ref 3.5–5.1)
Sodium: 141 mmol/L (ref 135–145)

## 2023-06-13 LAB — TROPONIN I (HIGH SENSITIVITY)
Troponin I (High Sensitivity): 16 ng/L (ref <18)
Troponin I (High Sensitivity): 18 ng/L — ABNORMAL HIGH (ref <18)

## 2023-06-13 MED ORDER — MINOXIDIL 2.5 MG PO TABS
2.5000 mg | ORAL_TABLET | Freq: Every day | ORAL | Status: DC
  Administered 2023-06-13: 2.5 mg via ORAL
  Filled 2023-06-13: qty 1

## 2023-06-13 MED ORDER — MINOXIDIL 2.5 MG PO TABS: 2.5000 mg | ORAL_TABLET | Freq: Every day | ORAL | 0 refills | Status: DC

## 2023-06-13 NOTE — ED Provider Notes (Signed)
[facility] EMERGENCY DEPARTMENT AT [facility] Provider Note   CSN: [id number] Arrival date & time: 06/13/23  1403     History  Chief Complaint  Patient presents with   Hypertension   Chest Pain    Alice Stewart is a 67 y.o. female.   Chest Pain    Pt has been having nausea, slight headache, insomnia and high blood pressure foe the last 10 days.  Pt is taking losartan 100 daily.  SHe is also taking lasix.    Pt has been referred to a htn clinic.  Patient states that her blood pressure continues to be elevated.  She has had multiple allergies to medications and they have had difficulty getting on a regimen.  Patient also states she had an echocardiogram recently and would like to discuss those results  Home Medications Prior to Admission medications   Medication Sig Start Date End Date Taking? Authorizing Provider  minoxidil (LONITEN) 2.5 MG tablet Take 1 tablet (2.5 mg total) by mouth daily. 06/13/23  Yes Linwood Dibbles, MD  acetaminophen (TYLENOL) 325 MG tablet Take 650 mg by mouth every 6 (six) hours as needed.    [provider]  Ascorbic Acid (VITAMIN C) 1000 MG tablet Take 1,000 mg by mouth daily.      [provider]  aspirin 81 MG EC tablet Take 81 mg by mouth daily.    [provider]  Bioflavonoid Products (BIOFLEX PO) Take 2 tablets by mouth daily.    [provider]  Biotin 29528 MCG TABS Take 10,000 mcg by mouth daily.    [provider]  Blood Pressure KIT 1 kit by Does not apply route daily. 05/29/23   Alver Sorrow, NP  Calcium Carb-Cholecalciferol 600-20 MG-MCG TABS Take 1 tablet by mouth daily.    [provider]  COLLAGEN PO Take 3 capsules by mouth daily.    [provider]  cyclobenzaprine (FLEXERIL) 10 MG tablet Take 0.5-1 tablets (5-10 mg total) by mouth 3 (three) times daily as needed for muscle spasms. Caution: can cause drowsiness 04/25/23   Christen Butter, NP  ferrous sulfate 325 (65 FE)  MG tablet Take 325 mg by mouth every other day.    [provider]  flecainide (TAMBOCOR) 100 MG tablet Take 1 tablet (100 mg total) by mouth 2 (two) times daily. 02/18/23   Lewayne Bunting, MD  Fluocinolone Acetonide Scalp (DERMA-SMOOTHE/FS SCALP) 0.01 % OIL Apply 1 application topically daily. 02/19/22   Christen Butter, NP  furosemide (LASIX) 20 MG tablet Take 2 tablets (40 mg total) by mouth daily. 04/28/23   Lewayne Bunting, MD  guaiFENesin-codeine (ROBITUSSIN AC) 100-10 MG/5ML syrup Take 5 mLs by mouth 3 (three) times daily as needed for cough. 05/26/23   Christen Butter, NP  Lactobacillus (PROBIOTIC ACIDOPHILUS PO) Take by mouth.    [provider]  losartan (COZAAR) 100 MG tablet Take 100 mg by mouth daily.    [provider]  Multiple Minerals-Vitamins (GNP CAL MAG ZINC +D3 PO) Take 3 tablets by mouth daily.    [provider]  Multiple Vitamin (MULTIVITAMIN) tablet Take 1 tablet by mouth daily.      [provider]  NON FORMULARY Take by mouth daily. 2 tablespoons olive oil    [provider]  Red Clover Leaf Extract 500 MG TABS Take 500 mg by mouth daily.    [provider]  vitamin B-12 (CYANOCOBALAMIN) 1000 MCG tablet Take 1,000 mcg by  mouth daily.    [provider]  vitamin E 180 MG (400 UNITS) capsule Take 400 Units by mouth daily.    [provider]      Allergies    Hydralazine, Amoxicillin, Duloxetine, Gabapentin, Latex, Bystolic [nebivolol hcl], Clonidine derivatives, Coreg [carvedilol], Estradiol, Amlodipine-atorvastatin, Chlorthalidone, Diltiazem hcl, Lisinopril, Metoprolol tartrate, Simvastatin, and Spironolactone    Review of Systems   Review of Systems  Cardiovascular:  Positive for chest pain.    Physical Exam Updated Vital Signs BP (!) 220/75   Pulse 69   Temp 98.5 F (36.9 C)   Resp 13   Ht 1.626 m (5\' 4" )   Wt 111.1 kg   LMP 09/27/2011   SpO2 98%   BMI 42.05 kg/m  Physical  Exam Vitals and nursing note reviewed.  Constitutional:      General: She is not in acute distress.    Appearance: She is well-developed.  HENT:     Head: Normocephalic and atraumatic.     Right Ear: External ear normal.     Left Ear: External ear normal.  Eyes:     General: No scleral icterus.       Right eye: No discharge.        Left eye: No discharge.     Conjunctiva/sclera: Conjunctivae normal.  Neck:     Trachea: No tracheal deviation.  Cardiovascular:     Rate and Rhythm: Normal rate and regular rhythm.  Pulmonary:     Effort: Pulmonary effort is normal. No respiratory distress.     Breath sounds: Normal breath sounds. No stridor. No wheezing or rales.  Abdominal:     General: Bowel sounds are normal. There is no distension.     Palpations: Abdomen is soft.     Tenderness: There is no abdominal tenderness. There is no guarding or rebound.  Musculoskeletal:        General: No tenderness or deformity.     Cervical back: Neck supple.     Right lower leg: Edema present.     Left lower leg: Edema present.  Skin:    General: Skin is warm and dry.     Findings: No rash.  Neurological:     General: No focal deficit present.     Mental Status: She is alert.     Cranial Nerves: No cranial nerve deficit, dysarthria or facial asymmetry.     Sensory: No sensory deficit.     Motor: No abnormal muscle tone or seizure activity.     Coordination: Coordination normal.  Psychiatric:        Mood and Affect: Mood normal.     ED Results / Procedures / Treatments   Labs (all labs ordered are listed, but only abnormal results are displayed) Labs Reviewed  BASIC METABOLIC PANEL - Abnormal; Notable for the following components:      Result Value   Glucose, Bld 112 (*)    All other components within normal limits  TROPONIN I (HIGH SENSITIVITY) - Abnormal; Notable for the following components:   Troponin I (High Sensitivity) 18 (*)    All other components within normal limits  CBC   TROPONIN I (HIGH SENSITIVITY)    EKG None  Radiology DG Chest 2 View  Result Date: 06/13/2023 CLINICAL DATA:  Chest pain EXAM: CHEST - 2 VIEW COMPARISON:  10/03/2011 FINDINGS: Transverse diameter of heart is increased. There are no signs of pulmonary edema or focal pulmonary consolidation. There is no pleural effusion or pneumothorax. 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 Biventricular pacer leads are noted in place. IMPRESSION: Cardiomegaly. There are no signs of pulmonary edema or focal pulmonary consolidation. Electronically Signed   By: Ernie Avena M.D.   On: 06/13/2023 15:23    Procedures Procedures    Medications Ordered in ED Medications - No data to display  ED Course/ Medical Decision Making/ A&P Clinical Course as of 06/13/23 1917  Fri Jun 13, 2023  1744 CBC normal.  Metabolic panel normal. [JK]  1745 Initial troponin normal at 16, second slightly elevated 18 [JK]  1745 Chest x-ray with cardiomegaly, no evidence of edema [JK]  1913 Reviewed the case with Dr. Anne Fu.  He recommend the patient starting chlorthalidone and if she refuses minoxidil.  Patient states she cannot take the chlorthalidone but is willing to try the minoxidil [JK]  1915 Blood pressure is decreased to 190 systolic. [JK]    Clinical Course User Index [JK] Linwood Dibbles, MD                             Medical Decision Making Amount and/or Complexity of Data Reviewed Labs: ordered. Radiology: ordered.  Risk Prescription drug management.   She presented to the ED with complaints of hypertension dizziness headache.  She had some mild chest pressure as well.  Symptoms ongoing for a well now.  Patient's ED workup is reassuring.  No cardiac dysrhythmia noted.  She is not anemic.  No hypotension noted.  Patient has a slight increase in her second troponin but likely related to her blood pressure.  Doubt acute coronary syndrome.  Reviewed the case  with Dr. Anne Fu cardiology.  We reviewed her allergies and recent echocardiogram.  Agrees with outpatient management adding minoxidil or chlorthalidone to her regimen.  Initially recommended chlorthalidone and when I discussed this with the patient he states she cannot take this medication.  She agrees to take the minoxidil.  Will have her start that regimen and follow-up closely in the hypertension clinic        Final Clinical Impression(s) / ED Diagnoses Final diagnoses:  Hypertension, unspecified type    Rx / DC Orders ED Discharge Orders          Ordered    minoxidil (LONITEN) 2.5 MG tablet  Daily        06/13/23 1915              Linwood Dibbles, MD 06/13/23 [phone]

## 2023-06-13 NOTE — Telephone Encounter (Signed)
Patient called to let us that she is on her way to the ER because her BP.  She states urgent care sent her over.

## 2023-06-13 NOTE — ED Notes (Addendum)
This RN at bedside discussing at length the complexities of blood pressure control. Pt expresses understanding of medication management and the importance of diet and exercise for blood pressure management

## 2023-06-13 NOTE — ED Triage Notes (Signed)
Pt arrived POV. C/O pt has had symptomatic HTN w/ dizziness, headache. Pt reports CP that feels like a pressure. Pt had near syncopal episode while in shower. Pt compliant w/ BP meds. Pt was told that EDP could give her the results of her current echo.

## 2023-06-13 NOTE — Telephone Encounter (Signed)
Called to patient to let her know we received her message.  Advised to call if released today and we will set F/U appt.  Otherwise call on Monday to schedule

## 2023-06-13 NOTE — Progress Notes (Signed)
SLEEP MEDICINE VIRTUAL CONSULT NOTE via Video Note   Because of Alice Stewart co-morbid illnesses, she is at least at moderate risk for complications without adequate follow up.  This format is felt to be most appropriate for this patient at this time.  All issues noted in this document were discussed and addressed.  A limited physical exam was performed with this format.  Please refer to the patient's chart for her consent to telehealth for [facility].      Date:  06/13/2023   ID:  Alice Stewart, DOB 1956/11/27, MRN [id number] The patient was identified using 2 identifiers.  Patient Location: Home Provider Location: Office/Clinic   PCP:  Christen Butter, NP   [facility] Providers Cardiologist:  Olga Millers, MD Electrophysiologist:  [name], MD     Evaluation Performed:  New Patient Evaluation  Chief Complaint: Obstructive sleep apnea  History of Present Illness:    Alice Stewart is a 67 y.o. female who is being seen today for the evaluation of OSA at the request of Olga Millers, MD.  Alice Stewart is a 67 y.o. female with a history of anemia, cardiomyopathy, hypertension, insulin resistance and obesity.  She was seen by Dr. Jens Som back in December 2023 and was felt to have significant risk factors for obstructive sleep apnea.  He ordered a home sleep study which demonstrated moderate obstructive sleep apnea with an AHI of 22.6/h and no central events.  There was nocturnal hypoxemia with O2 saturations less than 88% for 26 minutes.  She underwent CPAP titration on 01/17/2023 and was titrated to CPAP at 15 cm H2O.  She is now referred for sleep medicine consultation to establish sleep care.  She is doing well with her PAP device and thinks that she has gotten used to it. She tolerates the nasal mask and feels the pressure is adequate.  Since going on PAP she feels rested in the am and has no significant daytime sleepiness.  She  denies any significant nasal dryness or nasal congestion.  She does have problems with mouth dryness but the chin strap helps. She does not think that he snores.     Past Medical History:  Diagnosis Date   Anemia    Back pain    Benign positional vertigo    Cardiomyopathy (HCC)    Carpal tunnel syndrome    Goiter    nontoxid mutinodular   Hypertension    Hyperthyroidism    Insulin resistance syndrome    Menopausal disorder    Menorrhagia    Migraine    Muscle spasm    trapezius   Neck pain, acute    Obesity    Seborrheic dermatitis    Thyroid disorder    Past Surgical History:  Procedure Laterality Date   CHOLECYSTECTOMY     [place]   DILATION AND CURETTAGE OF UTERUS  03/23/2008   ENDOMETRIAL ABLATION  03/23/2008   GREAT TOE ARTHRODESIS, METATARSALPHALANGEAL JOINT  05/23/2006   Rt foot   HEEL SPUR SURGERY  10/23/1986   Lt foot   ICD GENERATOR CHANGEOUT N/A 01/18/2022   Procedure: ICD GENERATOR CHANGEOUT;  Surgeon: Regan Lemming, MD;  Location: [facility] INVASIVE CV LAB;  Service: Cardiovascular;  Laterality: N/A;   TUBAL LIGATION  10/23/1989     Current Meds  Medication Sig   acetaminophen (TYLENOL) 325 MG tablet Take 650 mg by mouth every 6 (six) hours as needed.   Ascorbic Acid (  VITAMIN C) 1000 MG tablet Take 1,000 mg by mouth daily.     aspirin 81 MG EC tablet Take 81 mg by mouth daily.   Bioflavonoid Products (BIOFLEX PO) Take 2 tablets by mouth daily.   Biotin 16109 MCG TABS Take 10,000 mcg by mouth daily.   Blood Pressure KIT 1 kit by Does not apply route daily.   Calcium Carb-Cholecalciferol 600-20 MG-MCG TABS Take 1 tablet by mouth daily.   COLLAGEN PO Take 3 capsules by mouth daily.   cyclobenzaprine (FLEXERIL) 10 MG tablet Take 0.5-1 tablets (5-10 mg total) by mouth 3 (three) times daily as needed for muscle spasms. Caution: can cause drowsiness   ferrous sulfate 325 (65 FE) MG tablet Take 325 mg by mouth every other day.   flecainide (TAMBOCOR) 100 MG  tablet Take 1 tablet (100 mg total) by mouth 2 (two) times daily.   Fluocinolone Acetonide Scalp (DERMA-SMOOTHE/FS SCALP) 0.01 % OIL Apply 1 application topically daily.   furosemide (LASIX) 20 MG tablet Take 2 tablets (40 mg total) by mouth daily.   guaiFENesin-codeine (ROBITUSSIN AC) 100-10 MG/5ML syrup Take 5 mLs by mouth 3 (three) times daily as needed for cough.   Lactobacillus (PROBIOTIC ACIDOPHILUS PO) Take by mouth.   losartan (COZAAR) 100 MG tablet Take 100 mg by mouth daily.   Multiple Minerals-Vitamins (GNP CAL MAG ZINC +D3 PO) Take 3 tablets by mouth daily.   Multiple Vitamin (MULTIVITAMIN) tablet Take 1 tablet by mouth daily.     NON FORMULARY Take by mouth daily. 2 tablespoons olive oil   Red Clover Leaf Extract 500 MG TABS Take 500 mg by mouth daily.   vitamin B-12 (CYANOCOBALAMIN) 1000 MCG tablet Take 1,000 mcg by mouth daily.   vitamin E 180 MG (400 UNITS) capsule Take 400 Units by mouth daily.     Allergies:   Hydralazine, Amoxicillin, Duloxetine, Gabapentin, Latex, Bystolic [nebivolol hcl], Clonidine derivatives, Coreg [carvedilol], Estradiol, Amlodipine-atorvastatin, Chlorthalidone, Diltiazem hcl, Lisinopril, Metoprolol tartrate, Simvastatin, and Spironolactone   Social History   Tobacco Use   Smoking status: Former    Types: Cigarettes    Quit date: 09/22/2004    Years since quitting: 18.7   Smokeless tobacco: Never  Vaping Use   Vaping Use: Never used  Substance Use Topics   Alcohol use: Not Currently    Alcohol/week: 1.0 standard drink of alcohol    Types: 1 Standard drinks or equivalent per week    Comment: 1 weekly   Drug use: No     Family Hx: The patient's family history includes Diabetes in an other family member; Hypertension in her father and sister; Hypothyroidism in her sister; Other in her mother.  ROS:   Please see the history of present illness.     All other systems reviewed and are negative.   Prior Sleep studies:   The following studies  were reviewed today:  Home sleep study, CPAP titration, PAP compliance download  Labs/Other Tests and Data Reviewed:     Recent Labs: 05/02/2023: NT-Pro BNP 317 05/29/2023: BUN 12; Creatinine, Ser 0.92; Potassium 4.6; Sodium 146; TSH 3.430    Wt Readings from Last 3 Encounters:  06/13/23 245 lb 6.4 oz (111.3 kg)  05/29/23 250 lb (113.4 kg)  05/26/23 250 lb 1.9 oz (113.5 kg)     Risk Assessment/Calculations:      STOP-Bang Score:  6      Objective:    Vital Signs:  BP (!) 203/86   Pulse 60   Ht 5'  4" (1.626 m)   Wt 245 lb 6.4 oz (111.3 kg)   LMP 09/27/2011   BMI 42.12 kg/m    VITAL SIGNS:  reviewed GEN:  no acute distress EYES:  sclerae anicteric, EOMI - Extraocular Movements Intact RESPIRATORY:  normal respiratory effort, symmetric expansion CARDIOVASCULAR:  no peripheral edema SKIN:  no rash, lesions or ulcers. MUSCULOSKELETAL:  no obvious deformities. NEURO:  alert and oriented x 3, no obvious focal deficit PSYCH:  normal affect  ASSESSMENT & PLAN:    OSA - The patient is tolerating PAP therapy well without any problems. The PAP download performed by his DME was personally reviewed and interpreted by me today and showed an AHI of 0.4 /hr on 15 cm H2O with 97% compliance in using more than 4 hours nightly.  The patient has been using and benefiting from PAP use and will continue to benefit from therapy.   Hypertension -BP is markedly elevated on exam today >> her reading was taken at 9am and read 203/86 and she then took her Losartan.  She tried to retake her BP and her device now is reading error.   I am concerned that she has been having HAs and BP is so high.  I instructed her to go to [facility] immediately to be evaluated. -Continue prescription drug management with losartan 100 mg daily with as needed refills -She has multiple medication intolerances so I will message Dr. Jens Som today in regards to recommendations for treatment of her BP since she  is having HAs.  Time:   Today, I have spent 15 minutes with the patient with telehealth technology discussing the above problems.     Medication Adjustments/Labs and Tests Ordered: Current medicines are reviewed at length with the patient today.  Concerns regarding medicines are outlined above.   Tests Ordered: No orders of the defined types were placed in this encounter.   Medication Changes: No orders of the defined types were placed in this encounter.   Follow Up:  In Person in 1 year(s)  Signed, Armanda Magic, MD  06/13/2023 11:24 AM    [facility]

## 2023-06-13 NOTE — Discharge Instructions (Addendum)
Start taking the minoxidil medication to add to your blood pressure medication.  Follow-up with the cardiology hypertension clinic for further treatment of your high blood pressure

## 2023-06-13 NOTE — Patient Instructions (Signed)
Medication Instructions:  Your physician recommends that you continue on your current medications as directed. Please refer to the Current Medication list given to you today.  *If you need a refill on your cardiac medications before your next appointment, please call your pharmacy*   Lab Work: None.  If you have labs (blood work) drawn today and your tests are completely normal, you will receive your results only by: MyChart Message (if you have MyChart) OR A paper copy in the mail If you have any lab test that is abnormal or we need to change your treatment, we will call you to review the results.   Testing/Procedures: None.   Follow-Up: At [facility], you and your health needs are our priority.  As part of our continuing mission to provide you with exceptional heart care, we have created designated Provider Care Teams.  These Care Teams include your primary Cardiologist (physician) and Advanced Practice Providers (APPs -  Physician Assistants and Nurse Practitioners) who all work together to provide you with the care you need, when you need it.    Your next appointment:   1 year(s)  Provider:   Dr. Traci Turner, MD    

## 2023-06-16 ENCOUNTER — Telehealth: Payer: Self-pay | Admitting: General Practice

## 2023-06-16 NOTE — Transitions of Care (Post Inpatient/ED Visit) (Signed)
   06/16/2023  Name: ANUPAMA PIEHL MRN: [id number] DOB: 1956/11/15  Today's TOC FU Call Status: Today's TOC FU Call Status:: Unsuccessul Call (1st Attempt) Unsuccessful Call (1st Attempt) Date: 06/16/23  Attempted to reach the patient regarding the most recent Inpatient/ED visit.  Follow Up Plan: Additional outreach attempts will be made to reach the patient to complete the Transitions of Care (Post Inpatient/ED visit) call.   Signature [place]esto Charon, Control and instrumentation engineer

## 2023-06-16 NOTE — Transitions of Care (Post Inpatient/ED Visit) (Signed)
06/16/2023  Name: Alice Stewart MRN: [id number] DOB: 1956/07/21  Today's TOC FU Call Status: Today's TOC FU Call Status:: Successful TOC FU Call Competed Unsuccessful Call (1st Attempt) Date: 06/16/23 [facility] FU Call Complete Date: 06/16/23  Transition Care Management Follow-up Telephone Call Date of Discharge: 06/13/23 Discharge Facility: [facility]) Type of Discharge: Emergency Department Reason for ED Visit: Cardiac Conditions Cardiac Conditions Diagnosis:  (HTN) How have you been since you were released from the hospital?: Better Any questions or concerns?: No  Items Reviewed: Did you receive and understand the discharge instructions provided?: Yes Medications obtained,verified, and reconciled?: Yes (Medications Reviewed) Any new allergies since your discharge?: No Dietary orders reviewed?: NA Do you have support at home?: Yes  Medications Reviewed Today: Medications Reviewed Today     Reviewed by Modesto Charon, RN (Registered Nurse) on 06/16/23 at 1143  Med List Status: <None>   Medication Order Taking? Sig Documenting Provider Last Dose Status Informant  acetaminophen (TYLENOL) 325 MG tablet [id number] No Take 650 mg by mouth every 6 (six) hours as needed. [provider] Taking Active   Ascorbic Acid (VITAMIN C) 1000 MG tablet [id number] No Take 1,000 mg by mouth daily.   [provider] Taking Active Self  aspirin 81 MG EC tablet [id number] No Take 81 mg by mouth daily. [provider] Taking Active Self  Bioflavonoid Products (BIOFLEX PO) [id number] No Take 2 tablets by mouth daily. [provider] Taking Active Self  Biotin 29937 MCG TABS [id number] No Take 10,000 mcg by mouth daily. [provider] Taking Active Self  Blood Pressure KIT [id number] No 1 kit by Does not apply route daily. Alver Sorrow, NP Taking Active   Calcium Carb-Cholecalciferol 600-20 MG-MCG TABS [id number] No Take 1 tablet by mouth daily. [provider] Taking Active Self  COLLAGEN PO [id number] No Take 3 capsules by mouth daily. [provider] Taking Active Self  cyclobenzaprine (FLEXERIL) 10 MG tablet [id number] No Take 0.5-1 tablets (5-10 mg total) by mouth 3 (three) times daily as needed for muscle spasms. Caution: can cause drowsiness Christen Butter, NP Taking Active   ferrous sulfate 325 (65 FE) MG tablet [id number] No Take 325 mg by mouth every other day. [provider] Taking Active Self  flecainide (TAMBOCOR) 100 MG tablet [id number] No Take 1 tablet (100 mg total) by mouth 2 (two) times daily. Lewayne Bunting, MD Taking Active   Fluocinolone Acetonide Scalp (DERMA-SMOOTHE/FS SCALP) 0.01 % OIL [id number] No Apply 1 application topically daily. Christen Butter, NP Taking Active   furosemide (LASIX) 20 MG tablet [id number] No Take 2 tablets (40 mg total) by mouth daily. Lewayne Bunting, MD Taking Active   guaiFENesin-codeine [facility]) 100-10 MG/5ML syrup [id number] No Take 5 mLs by mouth 3 (three) times daily as needed for cough. Christen Butter, NP Taking Active   Lactobacillus (PROBIOTIC ACIDOPHILUS PO) [id number] No Take by mouth. [provider] Taking Active   losartan (COZAAR) 100 MG tablet [id number] No Take 100 mg by mouth daily. [provider] Taking Active   minoxidil (LONITEN) 2.5 MG tablet [id number]  Take 1 tablet (2.5 mg total) by mouth daily. Linwood Dibbles, MD  Active   Multiple Minerals-Vitamins (GNP CAL MAG ZINC +D3 PO) [id number] No Take 3 tablets by mouth daily. [provider] Taking Active Self  Multiple Vitamin (MULTIVITAMIN) tablet [id number] No Take 1 tablet by mouth daily.   [provider] Taking Active Self  NON FORMULARY [id number] No Take  by mouth daily. 2 tablespoons olive oil [provider] Taking Active   Red Clover Leaf Extract 500 MG TABS [id number] No Take 500 mg by mouth daily. [provider] Taking Active Self  vitamin B-12  (CYANOCOBALAMIN) 1000 MCG tablet [id number] No Take 1,000 mcg by mouth daily. [provider] Taking Active Self  vitamin E 180 MG (400 UNITS) capsule [id number] No Take 400 Units by mouth daily. [provider] Taking Active Self            Home Care and Equipment/Supplies: Were Home Health Services Ordered?: NA Any new equipment or medical supplies ordered?: NA  Functional Questionnaire: Do you need assistance with bathing/showering or dressing?: No Do you need assistance with meal preparation?: No Do you need assistance with eating?: No Do you have difficulty maintaining continence: No Do you need assistance with getting out of bed/getting out of a chair/moving?: No Do you have difficulty managing or taking your medications?: No  Follow up appointments reviewed: PCP Follow-up appointment confirmed?: NA Specialist Hospital Follow-up appointment confirmed?: Yes Date of Specialist follow-up appointment?: 06/18/23 Follow-Up Specialty Provider:: Dr. Jens Som Do you need transportation to your follow-up appointment?: No Do you understand care options if your condition(s) worsen?: Yes-patient verbalized understanding    SIGNATURE Modesto Charon, RN BSN Nurse Health Advisor

## 2023-06-17 ENCOUNTER — Ambulatory Visit (HOSPITAL_COMMUNITY)
Admission: RE | Admit: 2023-06-17 | Discharge: 2023-06-17 | Disposition: A | Discharge status: Home / Self Care | Payer: 59 | Source: Ambulatory Visit | Attending: Cardiology | Admitting: Cardiology

## 2023-06-17 DIAGNOSIS — I10 Essential (primary) hypertension: Secondary | ICD-10-CM | POA: Insufficient documentation

## 2023-06-18 ENCOUNTER — Encounter: Payer: Self-pay | Admitting: Cardiology

## 2023-06-18 ENCOUNTER — Ambulatory Visit (INDEPENDENT_AMBULATORY_CARE_PROVIDER_SITE_OTHER): Payer: 59 | Admitting: Cardiology

## 2023-06-18 VITALS — BP 195/86 | HR 70 | Ht 64.0 in | Wt 254.0 lb

## 2023-06-18 DIAGNOSIS — I10 Essential (primary) hypertension: Secondary | ICD-10-CM | POA: Diagnosis not present

## 2023-06-18 DIAGNOSIS — I428 Other cardiomyopathies: Secondary | ICD-10-CM

## 2023-06-18 DIAGNOSIS — E782 Mixed hyperlipidemia: Secondary | ICD-10-CM | POA: Diagnosis not present

## 2023-06-18 MED ORDER — MINOXIDIL 2.5 MG PO TABS: 2.5000 mg | ORAL_TABLET | Freq: Every day | ORAL | 3 refills | Status: DC

## 2023-06-18 NOTE — Patient Instructions (Addendum)
Medication Instructions:   INCREASE MINOXIDIL TO 5 MG ONCE DAILY= 2 OF THE 2.5 MG TABLETS ONCE DAILY  *If you need a refill on your cardiac medications before your next appointment, please call your pharmacy*   Follow-Up: At [facility], you and your health needs are our priority.  As part of our continuing mission to provide you with exceptional heart care, we have created designated Provider Care Teams.  These Care Teams include your primary Cardiologist (physician) and Advanced Practice Providers (APPs -  Physician Assistants and Nurse Practitioners) who all work together to provide you with the care you need, when you need it.  We recommend signing up for the patient portal called "MyChart".  Sign up information is provided on this After Visit Summary.  MyChart is used to connect with patients for Virtual Visits (Telemedicine).  Patients are able to view lab/test results, encounter notes, upcoming appointments, etc.  Non-urgent messages can be sent to your provider as well.   To learn more about what you can do with MyChart, go to [email or web address].    Your next appointment:    AS SCHEDULED

## 2023-06-25 ENCOUNTER — Ambulatory Visit (INDEPENDENT_AMBULATORY_CARE_PROVIDER_SITE_OTHER): Payer: 59 | Admitting: Family

## 2023-06-25 ENCOUNTER — Encounter (HOSPITAL_BASED_OUTPATIENT_CLINIC_OR_DEPARTMENT_OTHER): Payer: Self-pay | Admitting: Family

## 2023-06-25 VITALS — BP 180/79 | HR 61 | Ht 64.0 in | Wt 255.0 lb

## 2023-06-25 DIAGNOSIS — I1A0 Resistant hypertension: Secondary | ICD-10-CM

## 2023-06-25 DIAGNOSIS — G4733 Obstructive sleep apnea (adult) (pediatric): Secondary | ICD-10-CM

## 2023-06-25 DIAGNOSIS — I428 Other cardiomyopathies: Secondary | ICD-10-CM

## 2023-06-25 DIAGNOSIS — Z95810 Presence of automatic (implantable) cardiac defibrillator: Secondary | ICD-10-CM | POA: Diagnosis not present

## 2023-06-25 DIAGNOSIS — I5042 Chronic combined systolic (congestive) and diastolic (congestive) heart failure: Secondary | ICD-10-CM | POA: Diagnosis not present

## 2023-06-25 MED ORDER — LOSARTAN POTASSIUM 100 MG PO TABS
100.0000 mg | ORAL_TABLET | Freq: Every day | ORAL | 1 refills | Status: DC
Start: 2023-06-25 — End: 2023-07-31

## 2023-06-25 MED ORDER — MINOXIDIL 2.5 MG PO TABS
7.5000 mg | ORAL_TABLET | Freq: Every day | ORAL | 1 refills | Status: DC
Start: 2023-06-25 — End: 2023-07-16

## 2023-06-25 NOTE — Progress Notes (Signed)
[facility] Initial Assessment:    Date:  06/25/2023   ID:  Alice Stewart, DOB [birth year], MRN [id number]  PCP:  Christen Butter, NP  Cardiologist:  Olga Millers, MD  Nephrologist:  Referring MD: Christen Butter, NP   CC: Hypertension  History of Present Illness:    Alice Stewart is a 67 y.o. 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   Prior CT abdomen pelvis 07/2021 with normal adrenal glands.  Labs 4/23 normal thyroid function.  CTA head/neck 06/2021 with bilateral no cyst occlusion or stenosis. SHe ahd echo upcoming later this month.  Previously wore 24-hour blood pressure monitor 04/15/2023 with overall average blood pressure 205/85.  During waking hours 201/85, sleeping 208/85.  Established with [facility] 05/29/23. Alice Stewart was diagnosed with hypertension when she was 67 years old. It has been difficult to control due to medication intolerances. Feels she tolerates medications for awhile then develops side effects. Previous tobacco use having quit 09/2004. Alcohol use never. She was walking twice per week for exercise. At visit 05/29/23 she noted intolerance to Clonidine and had independently returned to using losartan 100 mg daily as she felt this was most effective.  Renal duplex ordered and performed 06/19/2023 with no renal artery stenosis there was cyst on the right kidney recommend for further follow-up with PCP.  ED visit 06/13/23 at which time minoxidil was initiated. At follow up with Dr. Jens Som 06/18/23 it was increased to 5mg  every day. Admission was discussed due to marked hypertension 195/86 but she declined.   Presents today for follow up. BP at home has still been in the systolic 200s even after  medications. She is taking Losartan 100mg  in the morning and Minoxidil in the evening. We reviewed renal denervation in detail. Feels Minoxidil makes her sleepy and a bit lightheaded. No spinning, near syncope, syncope.   Previous antihypertensives: Bystolic - nausea Clonidine - "bad side effects", anxious, jittery Carvedilol - swelling Amlodipine-atorvastatin - salty taste, stomach burning, headache Chlorthalidone - headache Diltiazem - dizziness Lisinopril - backache Metoprolol tartrate - nausea, headache, dizziness Spironolactone - diarrhea Hydralazine - chest heaviness, SOB, dizziness, unable to sleep after 1 tablet Labetolol - side effects  Doxazosin - did not tolerate Entresto - dizziness Valsartan - dizziness, memory issues, knee pain, fatigue Losartan HCTZ - ankle swelling Chlorthalidone - nausea  Past Medical History:  Diagnosis Date   Anemia    Back pain    Benign positional vertigo    Cardiomyopathy (HCC)    Carpal tunnel syndrome    Goiter    nontoxid mutinodular   Hypertension    Hyperthyroidism    Insulin resistance syndrome    Menopausal disorder    Menorrhagia    Migraine    Muscle spasm    trapezius   Neck pain, acute    Obesity    Seborrheic dermatitis    Thyroid disorder     Past Surgical History:  Procedure Laterality Date   CHOLECYSTECTOMY     [place]   DILATION AND CURETTAGE OF UTERUS  03/23/2008   ENDOMETRIAL ABLATION  03/23/2008   GREAT TOE ARTHRODESIS, METATARSALPHALANGEAL JOINT  05/23/2006   Rt foot   HEEL SPUR SURGERY  10/23/1986   Lt foot   ICD GENERATOR CHANGEOUT N/A 01/18/2022   Procedure:  ICD GENERATOR CHANGEOUT;  Surgeon: Regan Lemming, MD;  [facility] INVASIVE CV LAB;  Service: Cardiovascular;  Laterality: N/A;   TUBAL LIGATION  10/23/1989    Current Medications: Current Meds  Medication Sig   acetaminophen (TYLENOL) 325 MG tablet Take 650 mg by mouth every 6 (six) hours as needed.   Ascorbic Acid (VITAMIN C)  1000 MG tablet Take 1,000 mg by mouth daily.     aspirin 81 MG EC tablet Take 81 mg by mouth daily.   Bioflavonoid Products (BIOFLEX PO) Take 2 tablets by mouth daily.   Biotin 13086 MCG TABS Take 10,000 mcg by mouth daily.   Blood Pressure KIT 1 kit by Does not apply route daily.   Calcium Carb-Cholecalciferol 600-20 MG-MCG TABS Take 1 tablet by mouth daily.   COLLAGEN PO Take 3 capsules by mouth daily.   cyclobenzaprine (FLEXERIL) 10 MG tablet Take 0.5-1 tablets (5-10 mg total) by mouth 3 (three) times daily as needed for muscle spasms. Caution: can cause drowsiness   ferrous sulfate 325 (65 FE) MG tablet Take 325 mg by mouth every other day.   flecainide (TAMBOCOR) 100 MG tablet Take 1 tablet (100 mg total) by mouth 2 (two) times daily.   Fluocinolone Acetonide Scalp (DERMA-SMOOTHE/FS SCALP) 0.01 % OIL Apply 1 application topically daily.   furosemide (LASIX) 20 MG tablet Take 2 tablets (40 mg total) by mouth daily.   guaiFENesin-codeine (ROBITUSSIN AC) 100-10 MG/5ML syrup Take 5 mLs by mouth 3 (three) times daily as needed for cough.   Lactobacillus (PROBIOTIC ACIDOPHILUS PO) Take by mouth.   Multiple Minerals-Vitamins (GNP CAL MAG ZINC +D3 PO) Take 3 tablets by mouth daily.   Multiple Vitamin (MULTIVITAMIN) tablet Take 1 tablet by mouth daily.     NON FORMULARY Take by mouth daily. 2 tablespoons olive oil   Red Clover Leaf Extract 500 MG TABS Take 500 mg by mouth daily.   vitamin B-12 (CYANOCOBALAMIN) 1000 MCG tablet Take 1,000 mcg by mouth daily.   vitamin E 180 MG (400 UNITS) capsule Take 400 Units by mouth daily.   [DISCONTINUED] losartan (COZAAR) 100 MG tablet Take 100 mg by mouth daily.   [DISCONTINUED] minoxidil (LONITEN) 2.5 MG tablet Take 1 tablet (2.5 mg total) by mouth daily.     Allergies:   Hydralazine, Amoxicillin, Duloxetine, Gabapentin, Latex, Bystolic [nebivolol hcl], Clonidine derivatives, Coreg [carvedilol], Estradiol, Amlodipine-atorvastatin, Chlorthalidone, Diltiazem  hcl, Lisinopril, Metoprolol tartrate, Simvastatin, and Spironolactone   Social History   Socioeconomic History   Marital status: Divorced    Spouse name: Not on file   Number of children: 3   Years of education: 14   Highest education level: Some college, no degree  Occupational History   Occupation: unemployed    Employer: Statistician   Occupation: legally disabled  Tobacco Use   Smoking status: Former    Types: Cigarettes    Quit date: 09/22/2004    Years since quitting: 18.7   Smokeless tobacco: Never  Vaping Use   Vaping Use: Never used  Substance and Sexual Activity   Alcohol use: Not Currently    Alcohol/week: 1.0 standard drink of alcohol    Types: 1 Standard drinks or equivalent per week    Comment: 1 weekly   Drug use: No   Sexual activity: Yes    Birth control/protection: Post-menopausal  Other Topics Concern   Not on file  Social History Narrative   Lives alone. She has three children. She enjoys watching television, cooking, baking and sewing.  Social Determinants of Health   Financial Resource Strain: Low Risk  (04/25/2023)   Overall Financial Resource Strain (CARDIA)    Difficulty of Paying Living Expenses: Not hard at all  Food Insecurity: No Food Insecurity (04/25/2023)   Hunger Vital Sign    Worried About Running Out of Food in the Last Year: Never true    Ran Out of Food in the Last Year: Never true  Recent Concern: Food Insecurity - Food Insecurity Present (01/27/2023)   Hunger Vital Sign    Worried About Running Out of Food in the Last Year: Sometimes true    Ran Out of Food in the Last Year: Never true  Transportation Needs: Unmet Transportation Needs (06/16/2023)   PRAPARE - Administrator, Civil Service (Medical): Yes    Lack of Transportation (Non-Medical): Yes  Physical Activity: Insufficiently Active (04/25/2023)   Exercise Vital Sign    Days of Exercise per Week: 2 days    Minutes of Exercise per Session: 60 min  Stress: No Stress  Concern Present (04/25/2023)   [employer] of Occupational Health - Occupational Stress Questionnaire    Feeling of Stress : Not at all  Social Connections: Socially Isolated (04/25/2023)   Social Connection and Isolation Panel [NHANES]    Frequency of Communication with Friends and Family: More than three times a week    Frequency of Social Gatherings with Friends and Family: Once a week    Attends Religious Services: Never    Database administrator or Organizations: No    Attends Engineer, structural: Never    Marital Status: Divorced     Family History: The patient's family history includes Diabetes in an other family member; Hypertension in her father and sister; Hypothyroidism in her sister; Other in her mother.  ROS:   Please see the history of present illness.     All other systems reviewed and are negative.  EKGs/Labs/Other Studies Reviewed:    EKG:  EKG is not ordered today.    Recent Labs: 05/02/2023: NT-Pro BNP 317 05/29/2023: TSH 3.430 06/13/2023: BUN 14; Creatinine, Ser 0.94; Hemoglobin 13.7; Platelets 292; Potassium 3.8; Sodium 141   Recent Lipid Panel    Component Value Date/Time   CHOL 225 (H) 04/11/2022 1601   TRIG 173 (H) 04/11/2022 1601   HDL 49 (L) 04/11/2022 1601   CHOLHDL 4.6 04/11/2022 1601   VLDL 22 03/11/2007 1943   LDLCALC 145 (H) 04/11/2022 1601    Physical Exam:   VS:  BP (!) 180/79   Pulse 61   Ht 5\' 4"  (1.626 m)   Wt 255 lb (115.7 kg)   LMP 09/27/2011   BMI 43.77 kg/m  , BMI Body mass index is 43.77 kg/m. GENERAL:  Well appearing, overweight HEENT: Pupils equal round and reactive, fundi not visualized, oral mucosa unremarkable NECK:  No jugular venous distention, waveform within normal limits, carotid upstroke brisk and symmetric, no bruits, no thyromegaly LYMPHATICS:  No cervical adenopathy LUNGS:  Clear to auscultation bilaterally HEART:  RRR.  PMI not displaced or sustained,S1 and S2 within normal limits, no S3, no S4, no  clicks, no rubs, no murmurs ABD:  Flat, positive bowel sounds normal in frequency in pitch, no bruits, no rebound, no guarding, no midline pulsatile mass, no hepatomegaly, no splenomegaly EXT:  2 plus pulses throughout, no edema, no cyanosis no clubbing SKIN:  No rashes no nodules NEURO:  Cranial nerves II through XII grossly intact, motor grossly intact throughout PSYCH:  Cognitively intact, oriented to person place and time   ASSESSMENT/PLAN:    HTN - BP not at goal <130/80. Complicated by multiple prior intolerances detailed above. Previously discussed need to try new medications for at least one week prior to determining efficacy. Also discussed unusual to tolerate mediation for months then suddenly not tolerate.  Continue losartan 100 mg daily.  Increase minoxidil from 5 mg to 7.5 mg nightly. Renal duplex 05/2023 with no stenosis.  Plan for renal denervation.  Will start precertification process with her insurance.  Informed consent: We discussed the risk and benefits of renal denervation at length.  She is agreeable to proceed.  She verbalizes understanding of less than 1% risk of serious complications. Defer renin aldosterone level as would have to hold Losartan and previously did not tolerate Spironolactone.   HLD - Intolerance to statins. Managed via lifestyle alone at this time. Could consider Zetia or PCSK9i though  noted difficulty with multiple prior medications.   Combined systolic/diastolic heart failure / s/p CRT-D / PVC / Coronary calcificiation - Echo 06/03/23 LVEF 40 to 45%, no RWMA, mild LVH,gr2DD, mildly elevated PASP, moderate AI, borderline dilation ascending aorta 39mm, RA pressure . . Management per general cardiology team and Dr. Elberta Fortis of EP.  Lengthy portion of visit spent reviewing her echocardiogram results and management of heart failure.  She does note some more dyspnea and swelling, will increase to Lasix 60 mg daily for 3 days then return to 40 mg  daily.  OSA - Wears CPAP regularly.  Continued CPAP compliance encouraged.   Screening for Secondary Hypertension:     05/29/2023    1:21 PM  Causes  Drugs/Herbals Screened     - Comments 05/29/23 encouraged to stop OTC agents  Renovascular HTN Screened     - Comments 05/29/23 renal duplex ordered  Sleep Apnea Screened     - Comments wearing CPAP  Thyroid Disease Screened     - Comments 05/29/23 TSH ordered  Pheochromocytoma Screened     - Comments Prior CT 2022 normal adrenals  Coarctation of the Aorta Screened     - Comments 03/2022 CT no significant carotid stenosis  Compliance Screened     - Comments History of self-adjusting medications    Relevant Labs/Studies:    Latest Ref Rng & Units 06/13/2023    2:45 PM 05/29/2023   10:57 AM 05/02/2023    4:18 PM  Basic Labs  Sodium 135 - 145 mmol/L 141  146  142   Potassium 3.5 - 5.1 mmol/L 3.8  4.6  4.4   Creatinine 0.44 - 1.00 mg/dL 9.14  7.82  9.56        Latest Ref Rng & Units 05/29/2023   10:57 AM 04/11/2022    4:01 PM  Thyroid   TSH 0.450 - 4.500 uIU/mL 3.430  1.89                 06/17/2023   10:47 AM  Renovascular   Renal Artery [place] Completed Yes      Disposition:    FU with MD/PharmD as scheduled   Medication Adjustments/Labs and Tests Ordered: Current medicines are reviewed at length with the patient today.  Concerns regarding medicines are outlined above.  No orders of the defined types were placed in this encounter.  Meds ordered this encounter  Medications   minoxidil (LONITEN) 2.5 MG tablet    Sig: Take 3 tablets (7.5 mg total) by mouth daily.    Dispense:  270  tablet    Refill:  1    Order Specific Question:   Supervising Provider    Answer:   Jodelle Red [[id number]]   losartan (COZAAR) 100 MG tablet    Sig: Take 1 tablet (100 mg total) by mouth daily.    Dispense:  90 tablet    Refill:  1    Order Specific Question:   Supervising Provider    Answer:   Jodelle Red [[id number]]    Signed, Alver Sorrow, NP  06/25/2023 3:51 PM    [facility]

## 2023-06-25 NOTE — Patient Instructions (Addendum)
Medication Instructions:  Your physician has recommended you make the following change in your medication:  CHANGE Minoxidil to 7.5mg   daily Take three 2.5mg  tablets every evening  INCREASE Lasix to 3 tablets daily for 2-3 days then return to 2 tablets daily  *If you need a refill on your cardiac medications before your next appointment, please call your pharmacy*  Testing/Procedures: We have referred you for  a procedure called renal denervation to help lower blood pressure.   Follow-Up: At [facility], you and your health needs are our priority.  As part of our continuing mission to provide you with exceptional heart care, we have created designated Provider Care Teams.  These Care Teams include your primary Cardiologist (physician) and Advanced Practice Providers (APPs -  Physician Assistants and Nurse Practitioners) who all work together to provide you with the care you need, when you need it.  We recommend signing up for the patient portal called "MyChart".  Sign up information is provided on this After Visit Summary.  MyChart is used to connect with patients for Virtual Visits (Telemedicine).  Patients are able to view lab/test results, encounter notes, upcoming appointments, etc.  Non-urgent messages can be sent to your provider as well.   To learn more about what you can do with MyChart, go to [email or web address].    Your next appointment:   As scheduled with Dr. Duke Salvia  Other Instructions  To help strengthen your heart muscle function: Recommend drinking no more than 64 oz (2 liters) of fluid per day.  Follow low sodium heart healthy diet  DASH Eating Plan DASH stands for Dietary Approaches to Stop Hypertension. The DASH eating plan is a healthy eating plan that has been shown to: Lower high blood pressure (hypertension). Reduce your risk for type 2 diabetes, heart disease, and stroke. Help with weight loss. What are tips for following this plan? Reading  food labels Check food labels for the amount of salt (sodium) per serving. Choose foods with less than 5 percent of the Daily Value (DV) of sodium. In general, foods with less than 300 milligrams (mg) of sodium per serving fit into this eating plan. To find whole grains, look for the word "whole" as the first word in the ingredient list. Shopping Buy products labeled as "low-sodium" or "no salt added." Buy fresh foods. Avoid canned foods and pre-made or frozen meals. Cooking Try not to add salt when you cook. Use salt-free seasonings or herbs instead of table salt or sea salt. Check with your health care provider or pharmacist before using salt substitutes. Do not fry foods. Cook foods in healthy ways, such as baking, boiling, grilling, roasting, or broiling. Cook using oils that are good for your heart. These include olive, canola, avocado, soybean, and sunflower oil. Meal planning  Eat a balanced diet. This should include: 4 or more servings of fruits and 4 or more servings of vegetables each day. Try to fill half of your plate with fruits and vegetables. 6-8 servings of whole grains each day. 6 or less servings of lean meat, poultry, or fish each day. 1 oz is 1 serving. A 3 oz (85 g) serving of meat is about the same size as the palm of your hand. One egg is 1 oz (28 g). 2-3 servings of low-fat dairy each day. One serving is 1 cup (237 mL). 1 serving of nuts, seeds, or beans 5 times each week. 2-3 servings of heart-healthy fats. Healthy fats called omega-3 fatty acids are found  in foods such as walnuts, flaxseeds, fortified milks, and eggs. These fats are also found in cold-water fish, such as sardines, salmon, and mackerel. Limit how much you eat of: Canned or prepackaged foods. Food that is high in trans fat, such as fried foods. Food that is high in saturated fat, such as fatty meat. Desserts and other sweets, sugary drinks, and other foods with added sugar. Full-fat dairy products. Do  not salt foods before eating. Do not eat more than 4 egg yolks a week. Try to eat at least 2 vegetarian meals a week. Eat more home-cooked food and less restaurant, buffet, and fast food. Lifestyle When eating at a restaurant, ask if your food can be made with less salt or no salt. If you drink alcohol: Limit how much you have to: 0-1 drink a day if you are female. 0-2 drinks a day if you are female. Know how much alcohol is in your drink. In the [place] one drink is one 12 oz bottle of beer (355 mL), one 5 oz glass of wine (148 mL), or one 1 oz glass of hard liquor (44 mL). General information Avoid eating more than 2,300 mg of salt a day. If you have hypertension, you may need to reduce your sodium intake to 1,500 mg a day. Work with your provider to stay at a healthy body weight or lose weight. Ask what the best weight range is for you. On most days of the week, get at least 30 minutes of exercise that causes your heart to beat faster. This may include walking, swimming, or biking. Work with your provider or dietitian to adjust your eating plan to meet your specific calorie needs. What foods should I eat? Fruits All fresh, dried, or frozen fruit. Canned fruits that are in their natural juice and do not have sugar added to them. Vegetables Fresh or frozen vegetables that are raw, steamed, roasted, or grilled. Low-sodium or reduced-sodium tomato and vegetable juice. Low-sodium or reduced-sodium tomato sauce and tomato paste. Low-sodium or reduced-sodium canned vegetables. Grains Whole-grain or whole-wheat bread. Whole-grain or whole-wheat pasta. Brown rice. Orpah Cobb. Bulgur. Whole-grain and low-sodium cereals. Pita bread. Low-fat, low-sodium crackers. Whole-wheat flour tortillas. Meats and other proteins Skinless chicken or [place]. Ground chicken or [place]. Pork with fat trimmed off. Fish and seafood. Egg whites. Dried beans, peas, or lentils. Unsalted nuts, nut butters, and seeds.  Unsalted canned beans. Lean cuts of beef with fat trimmed off. Low-sodium, lean precooked or cured meat, such as sausages or meat loaves. Dairy Low-fat (1%) or fat-free (skim) milk. Reduced-fat, low-fat, or fat-free cheeses. Nonfat, low-sodium ricotta or cottage cheese. Low-fat or nonfat yogurt. Low-fat, low-sodium cheese. Fats and oils Soft margarine without trans fats. Vegetable oil. Reduced-fat, low-fat, or light mayonnaise and salad dressings (reduced-sodium). Canola, safflower, olive, avocado, soybean, and sunflower oils. Avocado. Seasonings and condiments Herbs. Spices. Seasoning mixes without salt. Other foods Unsalted popcorn and pretzels. Fat-free sweets. The items listed above may not be all the foods and drinks you can have. Talk to a dietitian to learn more. What foods should I avoid? Fruits Canned fruit in a light or heavy syrup. Fried fruit. Fruit in cream or butter sauce. Vegetables Creamed or fried vegetables. Vegetables in a cheese sauce. Regular canned vegetables that are not marked as low-sodium or reduced-sodium. Regular canned tomato sauce and paste that are not marked as low-sodium or reduced-sodium. Regular tomato and vegetable juices that are not marked as low-sodium or reduced-sodium. Rosita Fire. Olives. Grains [employer]  made with fat, such as croissants, muffins, or some breads. Dry pasta or rice meal packs. Meats and other proteins Fatty cuts of meat. Ribs. Fried meat. Tomasa Blase. Bologna, salami, and other precooked or cured meats, such as sausages or meat loaves, that are not lean and low in sodium. Fat from the back of a pig (fatback). Bratwurst. Salted nuts and seeds. Canned beans with added salt. Canned or smoked fish. Whole eggs or egg yolks. Chicken or [place] with skin. Dairy Whole or 2% milk, cream, and half-and-half. Whole or full-fat cream cheese. Whole-fat or sweetened yogurt. Full-fat cheese. Nondairy creamers. Whipped toppings. Processed cheese and cheese  spreads. Fats and oils Butter. Stick margarine. Lard. Shortening. Ghee. Bacon fat. Tropical oils, such as coconut, palm kernel, or palm oil. Seasonings and condiments Onion salt, garlic salt, seasoned salt, table salt, and sea salt. Worcestershire sauce. Tartar sauce. Barbecue sauce. Teriyaki sauce. Soy sauce, including reduced-sodium soy sauce. Steak sauce. Canned and packaged gravies. Fish sauce. Oyster sauce. Cocktail sauce. Store-bought horseradish. Ketchup. Mustard. Meat flavorings and tenderizers. Bouillon cubes. Hot sauces. Pre-made or packaged marinades. Pre-made or packaged taco seasonings. Relishes. Regular salad dressings. Other foods Salted popcorn and pretzels. The items listed above may not be all the foods and drinks you should avoid. Talk to a dietitian to learn more. Where to find more information National Heart, Lung, and Blood Institute (NHLBI): BuffaloDryCleaner.gl American Heart Association (AHA): heart.org Academy of Nutrition and Dietetics: eatright.org National Kidney Foundation (NKF): kidney.org This information is not intended to replace advice given to you by your health care provider. Make sure you discuss any questions you have with your health care provider. Document Revised: 12/26/2022 Document Reviewed: 12/26/2022 Elsevier Patient Education  2024 [employer]

## 2023-06-26 DIAGNOSIS — G4733 Obstructive sleep apnea (adult) (pediatric): Secondary | ICD-10-CM | POA: Diagnosis not present

## 2023-06-27 ENCOUNTER — Ambulatory Visit: Payer: 59 | Admitting: Nurse Practitioner

## 2023-07-08 ENCOUNTER — Telehealth: Payer: Self-pay | Admitting: *Deleted

## 2023-07-08 NOTE — Telephone Encounter (Signed)
The patient stated that she was not ready to move forward with the RDN procedure. She has an appointment with Dr. Duke Salvia on 7/24 and will discuss it with her at that time.

## 2023-07-08 NOTE — Telephone Encounter (Signed)
Left a message for the patient to call back to discuss scheduling the renal denervation procedure.   Hepler, Frutoso Schatz, Storm Frisk, NP; Marlene Lard, RN; Sandi Mariscal, RN Auth is now on file. Good to schedule.

## 2023-07-10 ENCOUNTER — Telehealth: Payer: Self-pay | Admitting: Cardiology

## 2023-07-10 NOTE — Telephone Encounter (Signed)
Pt c/o medication issue:  1. Name of Medication:   minoxidil (LONITEN) 2.5 MG tablet   2. How are you currently taking this medication (dosage and times per day)?   As prescribed  3. Are you having a reaction (difficulty breathing--STAT)?   Swelling feet, palpitations, ache in joints and SOB  4. What is your medication issue?   Patient stated she is having a lot of bad side effects with this medication.  Patient wants to know next steps.

## 2023-07-10 NOTE — Telephone Encounter (Signed)
Patient states she has been having a lot side effects from since she has started taking minoxidil. She states she has been short of breath, headaches, swelling in her ankles, joints in her body are sore. She state its also been causing her to have blurred vision. She don't think its helping her blood pressure either.  She states she does not want to take it anymore. Unable to take blood pressure. She was trying while on the phone but has soon as she pressed start it beeps and says error. Its not inflating. Discussed ED precautions

## 2023-07-11 NOTE — Telephone Encounter (Signed)
Would have low suspicion all of her symptoms are related to Minoxidil. If she would like, can reduce from 7.5mg  to 5mg  until her appointment next week.   Note to staff: RDN has already been approved but she wished to discuss with Dr. Duke Salvia prior to scheduling.   Alver Sorrow, NP

## 2023-07-11 NOTE — Telephone Encounter (Signed)
Patient has an appointment 07/16/23 with dr Duke Salvia.

## 2023-07-11 NOTE — Telephone Encounter (Signed)
Returned call to patient, states she was advised to stop the medications yesterday, she states she does feel better today. Advised patient to continue with instructions previously given and keep upcoming appointment to discuss medications and renal denervation procedure. Patient verbalizes understanding. Encouraged patient to monitor blood pressure and bring cuff to appointment. She states she is really unsure about proceeding with the above mentioned procedure. Advised patient she and Dr. Duke Salvia would talk about procedure and could make a decision.      "Would have low suspicion all of her symptoms are related to Minoxidil. If she would like, can reduce from 7.5mg  to 5mg  until her appointment next week.    Note to staff: RDN has already been approved but she wished to discuss with Dr. Duke Salvia prior to scheduling.    Alver Sorrow, NP"

## 2023-07-16 ENCOUNTER — Encounter (HOSPITAL_BASED_OUTPATIENT_CLINIC_OR_DEPARTMENT_OTHER): Payer: Self-pay | Admitting: Cardiovascular Disease

## 2023-07-16 ENCOUNTER — Ambulatory Visit (INDEPENDENT_AMBULATORY_CARE_PROVIDER_SITE_OTHER): Payer: 59 | Admitting: Cardiovascular Disease

## 2023-07-16 VITALS — BP 198/76 | HR 71 | Ht 64.0 in | Wt 259.0 lb

## 2023-07-16 DIAGNOSIS — E7800 Pure hypercholesterolemia, unspecified: Secondary | ICD-10-CM

## 2023-07-16 DIAGNOSIS — R7303 Prediabetes: Secondary | ICD-10-CM | POA: Diagnosis not present

## 2023-07-16 DIAGNOSIS — I10 Essential (primary) hypertension: Secondary | ICD-10-CM

## 2023-07-16 DIAGNOSIS — I493 Ventricular premature depolarization: Secondary | ICD-10-CM | POA: Diagnosis not present

## 2023-07-16 DIAGNOSIS — I428 Other cardiomyopathies: Secondary | ICD-10-CM | POA: Diagnosis not present

## 2023-07-16 DIAGNOSIS — R109 Unspecified abdominal pain: Secondary | ICD-10-CM | POA: Diagnosis not present

## 2023-07-16 DIAGNOSIS — R072 Precordial pain: Secondary | ICD-10-CM

## 2023-07-16 DIAGNOSIS — I5022 Chronic systolic (congestive) heart failure: Secondary | ICD-10-CM

## 2023-07-16 MED ORDER — METOPROLOL TARTRATE 100 MG PO TABS: ORAL_TABLET | ORAL | 0 refills | Status: DC

## 2023-07-16 NOTE — Patient Instructions (Addendum)
Medication Instructions:  TAKE METOPROLOL 100 MG 1 TABLET 2 HOURS PRIOR TO PROCEDURE   Labwork: BMET/BNP/URINALYSIS TODAY   Testing/Procedures: Your physician has requested that you have cardiac CT. Cardiac computed tomography (CT) is a painless test that uses an x-ray machine to take clear, detailed pictures of your heart. For further information please visit [email or web address] Please follow instruction sheet as given. THE OFFICE WILL CALL YOU TO SCHEDULE ONCE INSURANCE HAS BEEN REVIEWED   Follow-Up: 11/26/2023 2:15 PM WITH DR [facility]   Any Other Special Instructions Will Be Listed Below (If Applicable).  WILL CALL YOU TO SCHEDULE YOUR RENAL DENERVATION     Your cardiac CT will be scheduled at one of the below locations:   [facility] [address] [place], [place] [address] [phone]  OR  [facility] [address] [place], [place] [address] [phone]  OR   [facility] [address] [place], [place] [address] [phone]  If scheduled at [facility], please arrive at the [facility] (Entrance C2) of [facility] 30 minutes prior to test start time. You can use the FREE valet parking offered at entrance C (encouraged to control the heart rate for the test)  Proceed to the [facility] Radiology Department (first floor) to check-in and test prep.  All radiology patients and guests should use entrance C2 at [facility], accessed from [facility], even though the hospital's physical address listed is [address]    If scheduled at [facility] or [facility], please arrive 15 mins early for check-in and test prep.  There is spacious parking and easy access to the radiology department from the [facility] entrance. Please enter here and check-in with the  desk attendant.   Please follow these instructions carefully (unless otherwise directed):  On the Night Before the Test: Be sure to Drink plenty of water. Do not consume any caffeinated/decaffeinated beverages or chocolate 12 hours prior to your test. Do not take any antihistamines 12 hours prior to your test. If the patient has contrast allergy: Patient will need a prescription for Prednisone and very clear instructions (as follows): Prednisone 50 mg - take 13 hours prior to test Take another Prednisone 50 mg 7 hours prior to test Take another Prednisone 50 mg 1 hour prior to test Take Benadryl 50 mg 1 hour prior to test Patient must complete all four doses of above prophylactic medications. Patient will need a ride after test due to Benadryl.  On the Day of the Test: Drink plenty of water until 1 hour prior to the test. Do not eat any food 1 hour prior to test. You may take your regular medications prior to the test.  Take metoprolol (Lopressor) two hours prior to test. If you take Furosemide/Hydrochlorothiazide/Spironolactone, please HOLD on the morning of the test. FEMALES- please wear underwire-free bra if available, avoid dresses & tight clothing      After the Test: Drink plenty of water. After receiving IV contrast, you may experience a mild flushed feeling. This is normal. On occasion, you may experience a mild rash up to 24 hours after the test. This is not dangerous. If this occurs, you can take Benadryl 25 mg and increase your fluid intake. If you experience trouble breathing, this can be serious. If it is severe call 911 IMMEDIATELY. If it is mild, please call our office.  If you take any of these medications: Glipizide/Metformin, Avandament, Glucavance, please do not take 48 hours after completing test unless otherwise instructed.  We will call to schedule your test 2-4 weeks out understanding that some insurance companies will need an authorization prior to the service  being performed.   For more information and frequently asked questions, please visit our website : [email or web address]  For non-scheduling related questions, please contact the cardiac imaging nurse navigator should you have any questions/concerns: Cardiac Imaging Nurse Navigators Direct Office Dial: [phone]   For scheduling needs, including cancellations and rescheduling, please call [place], [phone].

## 2023-07-16 NOTE — Progress Notes (Signed)
Advanced Hypertension Clinic Follow-up:    Date:  07/16/2023   ID:  CHRISTI WIRICK, DOB August 08, 1956, MRN [id number]  PCP:  Christen Butter, NP  Cardiologist:  Olga Millers, MD  Nephrologist:  Referring MD: Christen Butter, NP   CC: Hypertension  History of Present Illness:    [name] is a 67 y.o. female with a hx of chronic systolic heart failure, nonischemic cardiomyopathy, PVCs, hypertension, prior tobacco abuse, OSA on CPAP, here for follow-up. Prior CT of the abdomen 07/2021 revealed normal adrenal glands.  She wore a 24-hour blood pressure monitor 03/2023 with average blood pressure of 205/85 during waking hours and 208/85 when asleep.  She was seen in the advanced hypertension clinic 06/2023.  She was diagnosed with hypertension around age 41 and it has been difficult to control due to multiple medication intolerances.  She was seen in the ED 05/2023 at which time she was started on minoxidil.  She saw Gillian Shields 06/2023 and blood pressure remained in the 200s on losartan and minoxidil.  They discussed renal denervation and the procedure has been scheduled.   Today, she reports struggling with hypertension since she was 67 yo. Her last pregnancy was at 67 yo in 60. She has tried many antihypertensives, most of which either cause side effects or will work for a little while before becoming ineffective. Recently Minoxidil was stopped due to increased swelling and weight gain. In the past month she has gained about 10 lbs which is atypical. She also complained of worsening palpitations ("jumping out of her chest") and headaches. In the office today her blood pressure is initially 172/77, and then 198/76 on manual recheck. She is wary of proceeding with the renal denervation procedure; she is concerned about this causing worsening issues such as urinary incontinence. Additionally she complains of intermittent chest pain localized to the left breast. This is a sharp pain with some relief  after getting up and moving around. Occasionally she also has bilateral shooting pains in her chest such as after bending over to lift something off the floor. The other day she experienced a pain just inferior to her ribcage that lasted for 5 minutes and felt like "somebody had hit her hard". She does have back pain with arthritis and fibromyalgia. She confirms having a prior heart catheterization in the past, around 2005. Weather permitting she will be active outdoors. She does not own a car making it more difficult to go to a gym. She lives on her own and is able to care for herself. However, she does report worsening shortness of breath. She now has to complete activities such as housework either at a slower pace, or a little bit at a time. Always has swelling in her legs and feet. From her calves down it may feel like her skin is stretched tight. She confirms adequate urine production on Lasix, which she usually takes 8-8:30 AM. She has been taking at least 3 pills a day for 60 mg, then goes down to 40 mg on days that her swelling appears improved. Typically she avoids salt and uses Ms. Dash for seasoning. She usually drinks water and coffee. She does use her CPAP every night. In the afternoons she usually needs to take a nap. She denies any lightheadedness, syncope, orthopnea, or PND.  Previous antihypertensives: Bystolic - nausea Clonidine - "bad side effects", anxious, jittery Carvedilol - swelling Amlodipine-atorvastatin - salty taste, stomach burning, headache Chlorthalidone - headache Diltiazem - dizziness Lisinopril - backache Metoprolol  tartrate - nausea, headache, dizziness Spironolactone - diarrhea Hydralazine - chest heaviness, SOB, dizziness, unable to sleep after 1 tablet Labetolol - side effects  Doxazosin - did not tolerate Entresto - dizziness Valsartan - dizziness, memory issues, knee pain, fatigue Losartan HCTZ - ankle swelling Chlorthalidone - nausea Minoxidil  Past  Medical History:  Diagnosis Date   Anemia    Back pain    Benign positional vertigo    Cardiomyopathy (HCC)    Carpal tunnel syndrome    Goiter    nontoxid mutinodular   Hypertension    Hyperthyroidism    Insulin resistance syndrome    Menopausal disorder    Menorrhagia    Migraine    Muscle spasm    trapezius   Neck pain, acute    Obesity    Seborrheic dermatitis    Thyroid disorder     Past Surgical History:  Procedure Laterality Date   CHOLECYSTECTOMY     [place]   DILATION AND CURETTAGE OF UTERUS  03/23/2008   ENDOMETRIAL ABLATION  03/23/2008   GREAT TOE ARTHRODESIS, METATARSALPHALANGEAL JOINT  05/23/2006   Rt foot   HEEL SPUR SURGERY  10/23/1986   Lt foot   ICD GENERATOR CHANGEOUT N/A 01/18/2022   Procedure: ICD GENERATOR CHANGEOUT;  Surgeon: Regan Lemming, MD;  Location: [facility] INVASIVE CV LAB;  Service: Cardiovascular;  Laterality: N/A;   TUBAL LIGATION  10/23/1989    Current Medications: Current Meds  Medication Sig   acetaminophen (TYLENOL) 325 MG tablet Take 650 mg by mouth every 6 (six) hours as needed.   Ascorbic Acid (VITAMIN C) 1000 MG tablet Take 1,000 mg by mouth daily.     aspirin 81 MG EC tablet Take 81 mg by mouth daily.   Bioflavonoid Products (BIOFLEX PO) Take 2 tablets by mouth daily.   Biotin 09811 MCG TABS Take 10,000 mcg by mouth daily.   Blood Pressure KIT 1 kit by Does not apply route daily.   Calcium Carb-Cholecalciferol 600-20 MG-MCG TABS Take 1 tablet by mouth daily.   COLLAGEN PO Take 3 capsules by mouth daily.   cyclobenzaprine (FLEXERIL) 10 MG tablet Take 0.5-1 tablets (5-10 mg total) by mouth 3 (three) times daily as needed for muscle spasms. Caution: can cause drowsiness   ferrous sulfate 325 (65 FE) MG tablet Take 325 mg by mouth every other day.   flecainide (TAMBOCOR) 100 MG tablet Take 1 tablet (100 mg total) by mouth 2 (two) times daily.   Fluocinolone Acetonide Scalp (DERMA-SMOOTHE/FS SCALP) 0.01 % OIL Apply 1  application topically daily.   furosemide (LASIX) 20 MG tablet Take 2 tablets (40 mg total) by mouth daily.   guaiFENesin-codeine (ROBITUSSIN AC) 100-10 MG/5ML syrup Take 5 mLs by mouth 3 (three) times daily as needed for cough.   Lactobacillus (PROBIOTIC ACIDOPHILUS PO) Take by mouth.   losartan (COZAAR) 100 MG tablet Take 1 tablet (100 mg total) by mouth daily.   Multiple Minerals-Vitamins (GNP CAL MAG ZINC +D3 PO) Take 3 tablets by mouth daily.   Multiple Vitamin (MULTIVITAMIN) tablet Take 1 tablet by mouth daily.     NON FORMULARY Take by mouth daily. 2 tablespoons olive oil   Red Clover Leaf Extract 500 MG TABS Take 500 mg by mouth daily.   vitamin B-12 (CYANOCOBALAMIN) 1000 MCG tablet Take 1,000 mcg by mouth daily.   vitamin E 180 MG (400 UNITS) capsule Take 400 Units by mouth daily.     Allergies:   Hydralazine, Amoxicillin, Duloxetine, Gabapentin, Latex, Bystolic [  nebivolol hcl], Clonidine derivatives, Coreg [carvedilol], Estradiol, Amlodipine-atorvastatin, Chlorthalidone, Diltiazem hcl, Lisinopril, Metoprolol tartrate, Simvastatin, and Spironolactone   Social History   Socioeconomic History   Marital status: Divorced    Spouse name: Not on file   Number of children: 3   Years of education: 14   Highest education level: Some college, no degree  Occupational History   Occupation: unemployed    Employer: Statistician   Occupation: legally disabled  Tobacco Use   Smoking status: Former    Current packs/day: 0.00    Types: Cigarettes    Quit date: 09/22/2004    Years since quitting: 18.8   Smokeless tobacco: Never  Vaping Use   Vaping status: Never Used  Substance and Sexual Activity   Alcohol use: Not Currently    Alcohol/week: 1.0 standard drink of alcohol    Types: 1 Standard drinks or equivalent per week    Comment: 1 weekly   Drug use: No   Sexual activity: Yes    Birth control/protection: Post-menopausal  Other Topics Concern   Not on file  Social History Narrative    Lives alone. She has three children. She enjoys watching television, cooking, baking and sewing.   Social Determinants of Health   Financial Resource Strain: Low Risk  (04/25/2023)   Overall Financial Resource Strain (CARDIA)    Difficulty of Paying Living Expenses: Not hard at all  Food Insecurity: No Food Insecurity (04/25/2023)   Hunger Vital Sign    Worried About Running Out of Food in the Last Year: Never true    Ran Out of Food in the Last Year: Never true  Recent Concern: Food Insecurity - Food Insecurity Present (01/27/2023)   Hunger Vital Sign    Worried About Running Out of Food in the Last Year: Sometimes true    Ran Out of Food in the Last Year: Never true  Transportation Needs: Unmet Transportation Needs (06/16/2023)   PRAPARE - Administrator, Civil Service (Medical): Yes    Lack of Transportation (Non-Medical): Yes  Physical Activity: Insufficiently Active (04/25/2023)   Exercise Vital Sign    Days of Exercise per Week: 2 days    Minutes of Exercise per Session: 60 min  Stress: No Stress Concern Present (05/09/2023)   Received from [facility]   [employer] of Occupational Health - Occupational Stress Questionnaire    Feeling of Stress : Only a little  Social Connections: Socially Isolated (04/25/2023)   Social Connection and Isolation Panel [NHANES]    Frequency of Communication with Friends and Family: More than three times a week    Frequency of Social Gatherings with Friends and Family: Once a week    Attends Religious Services: Never    Database administrator or Organizations: No    Attends Engineer, structural: Never    Marital Status: Divorced     Family History: The patient's family history includes Diabetes in an other family member; Hypertension in her father and sister; Hypothyroidism in her sister; Other in her mother.  ROS:   Please see the history of present illness.    (+) Intermittent chest pains (+) Shortness  of breath (+) LE edema (+) Back pain All other systems reviewed and are negative.  EKGs/Labs/Other Studies Reviewed:    Bilateral Renal Artery Dopplers  06/17/2023: Summary:  Largest Aortic Diameter: 2.6 cm    Renal:    Right: Normal size right kidney. Normal right Resistive Index.  Normal cortical thickness of right kidney. No evidence of         right renal artery stenosis. RRV flow present. Cyst(s) noted.         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  Left:  Normal size of left kidney. Normal left Resistive Index.         Normal cortical thickness of the left kidney. No evidence of         left renal artery stenosis. LRV flow present.  Mesenteric:  Normal Celiac artery and Superior Mesenteric artery findings.    Patent IVC.   Echo  06/03/2023:  1. Left ventricular ejection fraction, by estimation, is 40 to 45%. The  left ventricle has mildly decreased function. The left ventricle has no  regional wall motion abnormalities. There is mild concentric left  ventricular hypertrophy. Left ventricular  diastolic parameters are consistent with Grade II diastolic dysfunction  (pseudonormalization).   2. Right ventricular systolic function is normal. The right ventricular  size is normal. There is mildly elevated pulmonary artery systolic  pressure. The estimated right ventricular systolic pressure is 39.6 mmHg.   3. Left atrial size was moderately dilated.   4. The mitral valve is normal in structure. Mild mitral valve  regurgitation. No evidence of mitral stenosis.   5. The aortic valve cusps are not well visualized, however, there is at  least moderate aortic valve regurgitation that appears to be originating  at the right coronary cusp. Consider TEE for further evaluation.   6. Aortic dilatation noted. There is borderline dilatation of the  ascending aorta, measuring 39 mm.   7. The inferior vena cava is  dilated in size with <50% respiratory  variability, suggesting right atrial pressure of 15 mmHg.   24 HR Blood Pressure Monitor  03/2023:   Overall average BP 205/67mmHg   Average BP while awake 201/16mmHg   Average BP while asleep 208/24mmHg   100% of SBPs were >180mmHg while awake and >151mmHg while asleep.   35% of SBPs were >13mmHg while awake and >80Hg while asleep.   EKG:  EKG is personally reviewed. 07/16/2023: Not ordered.  Recent Labs: 05/02/2023: NT-Pro BNP 317 05/29/2023: TSH 3.430 06/13/2023: BUN 14; Creatinine, Ser 0.94; Hemoglobin 13.7; Platelets 292; Potassium 3.8; Sodium 141   Recent Lipid Panel    Component Value Date/Time   CHOL 225 (H) 04/11/2022 1601   TRIG 173 (H) 04/11/2022 1601   HDL 49 (L) 04/11/2022 1601   CHOLHDL 4.6 04/11/2022 1601   VLDL 22 03/11/2007 1943   LDLCALC 145 (H) 04/11/2022 1601    Physical Exam:    VS:  BP (!) 198/76 (BP Location: Right Arm, Patient Position: Sitting, Cuff Size: Large)   Pulse 71   Ht 5\' 4"  (1.626 m)   Wt 259 lb (117.5 kg)   LMP 09/27/2011   BMI 44.46 kg/m  , BMI Body mass index is 44.46 kg/m. GENERAL:  Well appearing HEENT: Pupils equal round and reactive, fundi not visualized, oral mucosa unremarkable NECK:  No jugular venous distention, waveform within normal limits, carotid upstroke brisk and symmetric, no bruits, no thyromegaly LUNGS:  Clear to auscultation bilaterally HEART:  RRR.  PMI not displaced or sustained, S1 and S2 within normal limits, no S3, no S4, no clicks, no rubs, no murmurs ABD:  Flat, positive bowel sounds normal in frequency in pitch, no bruits, no  rebound, no guarding, no midline pulsatile mass, no hepatomegaly, no splenomegaly EXT:  2 plus pulses throughout, no edema, no cyanosis, no clubbing SKIN:  No rashes, no nodules NEURO:  Cranial nerves II through XII grossly intact, motor grossly intact throughout PSYCH:  Cognitively intact, oriented to person place and time   ASSESSMENT/PLAN:        # Uncontrolled Hypertension: Struggling with medication side effects, including fatigue, headaches, and edema. Discussed the role of kidneys in blood pressure regulation and the potential benefit of renal denervation procedure. -Recommend proceeding with renal denervation procedure pending results of cardiac CT.    # Heart Failure with Reduced Ejection Fraction (HFrEF): Ejection fraction 40-45%, experiencing increased shortness of breath. Discussed the role of fluid management in heart failure symptoms.  She appears euvolemic on exam -Check B-type Natriuretic Peptide (BNP) to assess fluid status. -Consider increasing Lasix dose depending on BNP results. - Continue losartan.  Unable to tolerate other GDMT as above.    # Atypical CP:  Symptoms are very atypical.  Will get coronary CT-A to assess.  She is unable to walk on a treadmill.  Continue aspirin.    # L Flank pain: Complaints of frequent urination and L flank pain, particularly after taking Lasix. -Order urinalysis to rule out urinary tract infection.  # PVCs; Managed on flecainide.  Follow-up: Plan to follow up after renal denervation procedure (if pursued) and again in 3-4 months to assess blood pressure control.      Informed Consent   Shared Decision Making/Informed Consent The risks [stroke (1 in 1000), death (1 in 1000), kidney failure [usually temporary] (1 in 500), bleeding (1 in 200), allergic reaction [possibly serious] (1 in 200)], benefits (diagnostic support and management of coronary artery disease) and alternatives of a renal denervation were discussed in detail with Ms. Cornforth and she is willing to proceed.      Screening for Secondary Hypertension:     05/29/2023    1:21 PM  Causes  Drugs/Herbals Screened     - Comments 05/29/23 encouraged to stop OTC agents  Renovascular HTN Screened     - Comments 05/29/23 renal duplex ordered  Sleep Apnea Screened     - Comments wearing CPAP  Thyroid Disease Screened     -  Comments 05/29/23 TSH ordered  Pheochromocytoma Screened     - Comments Prior CT 2022 normal adrenals  Coarctation of the Aorta Screened     - Comments 03/2022 CT no significant carotid stenosis  Compliance Screened     - Comments History of self-adjusting medications    Relevant Labs/Studies:    Latest Ref Rng & Units 06/13/2023    2:45 PM 05/29/2023   10:57 AM 05/02/2023    4:18 PM  Basic Labs  Sodium 135 - 145 mmol/L 141  146  142   Potassium 3.5 - 5.1 mmol/L 3.8  4.6  4.4   Creatinine 0.44 - 1.00 mg/dL 8.29  5.62  1.30        Latest Ref Rng & Units 05/29/2023   10:57 AM 04/11/2022    4:01 PM  Thyroid   TSH 0.450 - 4.500 uIU/mL 3.430  1.89                 06/17/2023   10:47 AM  Renovascular   Renal Artery [place] Completed Yes     Disposition:    FU with Jerae Izard C. Duke Salvia, MD, [facility] in 4 months.  Medication Adjustments/Labs and Tests Ordered: Current medicines  are reviewed at length with the patient today.  Concerns regarding medicines are outlined above.   No orders of the defined types were placed in this encounter.  No orders of the defined types were placed in this encounter.  I,Mathew Stumpf,acting as a Neurosurgeon for Chilton Si, MD.,have documented all relevant documentation on the behalf of Chilton Si, MD,as directed by  Chilton Si, MD while in the presence of Chilton Si, MD.  I, Aaryn Sermon C. Duke Salvia, MD have reviewed all documentation for this visit.  The documentation of the exam, diagnosis, procedures, and orders on 07/16/2023 are all accurate and complete.   Guadalupe Maple  07/16/2023 12:42 PM    [facility]

## 2023-07-16 NOTE — Progress Notes (Deleted)
Advanced Hypertension Clinic Initial Assessment:    Date:  07/16/2023   ID:  Alice Stewart, DOB 1956/09/21, MRN [id number]  PCP:  Christen Butter, NP  Cardiologist:  Olga Millers, MD  Nephrologist:  Referring MD: Christen Butter, NP   CC: Hypertension  History of Present Illness:    Alice Stewart is a 67 y.o. female with a hx of chronic systolic heart failure, nonischemic cardiomyopathy, PVCs, prior tobacco abuse, OSA on CPAP, hypertension here to establish care in the Advanced Hypertension Clinic.  Prior CT of the abdomen 07/2021 revealed normal adrenal glands.  She wore a 24-hour blood pressure monitor 03/2023 with average blood pressure of 205/85 during waking hours and 208/85 when asleep.  She was seen in the advanced hypertension clinic 06/2023.  She was diagnosed with hypertension around age 55 and it has been difficult to control due to multiple medication intolerances.  She was seen in the ED 05/2023 at which time she was started on minoxidil.  She saw Gillian Shields 06/2023 and blood pressure remained in the 200s on losartan and minoxidil.  They discussed renal denervation and the procedure has been scheduled.    Previous antihypertensives: Bystolic - nausea Clonidine - "bad side effects", anxious, jittery Carvedilol - swelling Amlodipine-atorvastatin - salty taste, stomach burning, headache Chlorthalidone - headache Diltiazem - dizziness Lisinopril - backache Metoprolol tartrate - nausea, headache, dizziness Spironolactone - diarrhea Hydralazine - chest heaviness, SOB, dizziness, unable to sleep after 1 tablet Labetolol - side effects  Doxazosin - did not tolerate Entresto - dizziness Valsartan - dizziness, memory issues, knee pain, fatigue Losartan HCTZ - ankle swelling Chlorthalidone - nausea   Past Medical History:  Diagnosis Date   Anemia    Back pain    Benign positional vertigo    Cardiomyopathy (HCC)    Carpal tunnel syndrome    Goiter    nontoxid  mutinodular   Hypertension    Hyperthyroidism    Insulin resistance syndrome    Menopausal disorder    Menorrhagia    Migraine    Muscle spasm    trapezius   Neck pain, acute    Obesity    Seborrheic dermatitis    Thyroid disorder     Past Surgical History:  Procedure Laterality Date   CHOLECYSTECTOMY     [place]   DILATION AND CURETTAGE OF UTERUS  03/23/2008   ENDOMETRIAL ABLATION  03/23/2008   GREAT TOE ARTHRODESIS, METATARSALPHALANGEAL JOINT  05/23/2006   Rt foot   HEEL SPUR SURGERY  10/23/1986   Lt foot   ICD GENERATOR CHANGEOUT N/A 01/18/2022   Procedure: ICD GENERATOR CHANGEOUT;  Surgeon: Regan Lemming, MD;  Location: [facility] INVASIVE CV LAB;  Service: Cardiovascular;  Laterality: N/A;   TUBAL LIGATION  10/23/1989    Current Medications: No outpatient medications have been marked as taking for the 07/16/23 encounter (Appointment) with Chilton Si, MD.     Allergies:   Hydralazine, Amoxicillin, Duloxetine, Gabapentin, Latex, Bystolic [nebivolol hcl], Clonidine derivatives, Coreg [carvedilol], Estradiol, Amlodipine-atorvastatin, Chlorthalidone, Diltiazem hcl, Lisinopril, Metoprolol tartrate, Simvastatin, and Spironolactone   Social History   Socioeconomic History   Marital status: Divorced    Spouse name: Not on file   Number of children: 3   Years of education: 14   Highest education level: Some college, no degree  Occupational History   Occupation: unemployed    Employer: Statistician   Occupation: legally disabled  Tobacco Use   Smoking status: Former    Current packs/day: 0.00  Types: Cigarettes    Quit date: 09/22/2004    Years since quitting: 18.8   Smokeless tobacco: Never  Vaping Use   Vaping status: Never Used  Substance and Sexual Activity   Alcohol use: Not Currently    Alcohol/week: 1.0 standard drink of alcohol    Types: 1 Standard drinks or equivalent per week    Comment: 1 weekly   Drug use: No   Sexual activity: Yes    Birth  control/protection: Post-menopausal  Other Topics Concern   Not on file  Social History Narrative   Lives alone. She has three children. She enjoys watching television, cooking, baking and sewing.   Social Determinants of Health   Financial Resource Strain: Low Risk  (04/25/2023)   Overall Financial Resource Strain (CARDIA)    Difficulty of Paying Living Expenses: Not hard at all  Food Insecurity: No Food Insecurity (04/25/2023)   Hunger Vital Sign    Worried About Running Out of Food in the Last Year: Never true    Ran Out of Food in the Last Year: Never true  Recent Concern: Food Insecurity - Food Insecurity Present (01/27/2023)   Hunger Vital Sign    Worried About Running Out of Food in the Last Year: Sometimes true    Ran Out of Food in the Last Year: Never true  Transportation Needs: Unmet Transportation Needs (06/16/2023)   PRAPARE - Administrator, Civil Service (Medical): Yes    Lack of Transportation (Non-Medical): Yes  Physical Activity: Insufficiently Active (04/25/2023)   Exercise Vital Sign    Days of Exercise per Week: 2 days    Minutes of Exercise per Session: 60 min  Stress: No Stress Concern Present (05/09/2023)   Received from [facility] - Occupational Stress Questionnaire    Feeling of Stress : Only a little  Social Connections: Socially Isolated (04/25/2023)   Social Connection and Isolation Panel [NHANES]    Frequency of Communication with Friends and Family: More than three times a week    Frequency of Social Gatherings with Friends and Family: Once a week    Attends Religious Services: Never    Database administrator or Organizations: No    Attends Engineer, structural: Never    Marital Status: Divorced     Family History: The patient's ***family history includes Diabetes in an other family member; Hypertension in her father and sister; Hypothyroidism in her sister; Other in her mother.  ROS:    Please see the history of present illness.    *** All other systems reviewed and are negative.  EKGs/Labs/Other Studies Reviewed:    EKG:  EKG is *** ordered today.  The ekg ordered today demonstrates ***  Recent Labs: 05/02/2023: NT-Pro BNP 317 05/29/2023: TSH 3.430 06/13/2023: BUN 14; Creatinine, Ser 0.94; Hemoglobin 13.7; Platelets 292; Potassium 3.8; Sodium 141   Recent Lipid Panel    Component Value Date/Time   CHOL 225 (H) 04/11/2022 1601   TRIG 173 (H) 04/11/2022 1601   HDL 49 (L) 04/11/2022 1601   CHOLHDL 4.6 04/11/2022 1601   VLDL 22 03/11/2007 1943   LDLCALC 145 (H) 04/11/2022 1601    Physical Exam:   VS:  LMP 09/27/2011  , BMI There is no height or weight on file to calculate BMI. GENERAL:  Well appearing HEENT: Pupils equal round and reactive, fundi not visualized, oral mucosa unremarkable NECK:  No jugular venous distention, waveform within normal limits, carotid  upstroke brisk and symmetric, no bruits, no thyromegaly LYMPHATICS:  No cervical adenopathy LUNGS:  Clear to auscultation bilaterally HEART:  RRR.  PMI not displaced or sustained,S1 and S2 within normal limits, no S3, no S4, no clicks, no rubs, *** murmurs ABD:  Flat, positive bowel sounds normal in frequency in pitch, no bruits, no rebound, no guarding, no midline pulsatile mass, no hepatomegaly, no splenomegaly EXT:  2 plus pulses throughout, no edema, no cyanosis no clubbing SKIN:  No rashes no nodules NEURO:  Cranial nerves II through XII grossly intact, motor grossly intact throughout PSYCH:  Cognitively intact, oriented to person place and time   ASSESSMENT/PLAN:    No problem-specific Assessment & Plan notes found for this encounter.   Screening for Secondary Hypertension: { Click here to document screening for secondary causes of HTN  :[id number]}    05/29/2023    1:21 PM  Causes  Drugs/Herbals Screened     - Comments 05/29/23 encouraged to stop OTC agents  Renovascular HTN Screened     -  Comments 05/29/23 renal duplex ordered  Sleep Apnea Screened     - Comments wearing CPAP  Thyroid Disease Screened     - Comments 05/29/23 TSH ordered  Pheochromocytoma Screened     - Comments Prior CT 2022 normal adrenals  Coarctation of the Aorta Screened     - Comments 03/2022 CT no significant carotid stenosis  Compliance Screened     - Comments History of self-adjusting medications    Relevant Labs/Studies:    Latest Ref Rng & Units 06/13/2023    2:45 PM 05/29/2023   10:57 AM 05/02/2023    4:18 PM  Basic Labs  Sodium 135 - 145 mmol/L 141  146  142   Potassium 3.5 - 5.1 mmol/L 3.8  4.6  4.4   Creatinine 0.44 - 1.00 mg/dL 1.19  1.47  8.29        Latest Ref Rng & Units 05/29/2023   10:57 AM 04/11/2022    4:01 PM  Thyroid   TSH 0.450 - 4.500 uIU/mL 3.430  1.89                 06/17/2023   10:47 AM  Renovascular   Renal Artery [place] Completed Yes        she consents to be monitored in our remote patient monitoring program through [employer].  she will track his blood pressure twice daily and understands that these trends will help [place] to adjust her medications as needed prior to his next appointment.  she *** interested in enrolling in the PREP exercise and nutrition program through the [facility].     Disposition:    FU with MD/PharmD in {gen number [id number]} {Days to years:10300}    Medication Adjustments/Labs and Tests Ordered: Current medicines are reviewed at length with the patient today.  Concerns regarding medicines are outlined above.  No orders of the defined types were placed in this encounter.  No orders of the defined types were placed in this encounter.    Signed, Chilton Si, MD  07/16/2023 8:01 AM     [facility]

## 2023-07-16 NOTE — H&P (View-Only) (Signed)
 Advanced Hypertension Clinic Follow-up:    Date:  07/16/2023   ID:  Alice Stewart, DOB August 08, 1956, MRN [id number]  PCP:  Christen Butter, NP  Cardiologist:  Olga Millers, MD  Nephrologist:  Referring MD: Christen Butter, NP   CC: Hypertension  History of Present Illness:    Alice Stewart is a 67 y.o. female with a hx of chronic systolic heart failure, nonischemic cardiomyopathy, PVCs, hypertension, prior tobacco abuse, OSA on CPAP, here for follow-up. Prior CT of the abdomen 07/2021 revealed normal adrenal glands.  She wore a 24-hour blood pressure monitor 03/2023 with average blood pressure of 205/85 during waking hours and 208/85 when asleep.  She was seen in the advanced hypertension clinic 06/2023.  She was diagnosed with hypertension around age 41 and it has been difficult to control due to multiple medication intolerances.  She was seen in the ED 05/2023 at which time she was started on minoxidil.  She saw Gillian Shields 06/2023 and blood pressure remained in the 200s on losartan and minoxidil.  They discussed renal denervation and the procedure has been scheduled.   Today, she reports struggling with hypertension since she was 67 yo. Her last pregnancy was at 67 yo in 60. She has tried many antihypertensives, most of which either cause side effects or will work for a little while before becoming ineffective. Recently Minoxidil was stopped due to increased swelling and weight gain. In the past month she has gained about 10 lbs which is atypical. She also complained of worsening palpitations ("jumping out of her chest") and headaches. In the office today her blood pressure is initially 172/77, and then 198/76 on manual recheck. She is wary of proceeding with the renal denervation procedure; she is concerned about this causing worsening issues such as urinary incontinence. Additionally she complains of intermittent chest pain localized to the left breast. This is a sharp pain with some relief  after getting up and moving around. Occasionally she also has bilateral shooting pains in her chest such as after bending over to lift something off the floor. The other day she experienced a pain just inferior to her ribcage that lasted for 5 minutes and felt like "somebody had hit her hard". She does have back pain with arthritis and fibromyalgia. She confirms having a prior heart catheterization in the past, around 2005. Weather permitting she will be active outdoors. She does not own a car making it more difficult to go to a gym. She lives on her own and is able to care for herself. However, she does report worsening shortness of breath. She now has to complete activities such as housework either at a slower pace, or a little bit at a time. Always has swelling in her legs and feet. From her calves down it may feel like her skin is stretched tight. She confirms adequate urine production on Lasix, which she usually takes 8-8:30 AM. She has been taking at least 3 pills a day for 60 mg, then goes down to 40 mg on days that her swelling appears improved. Typically she avoids salt and uses Ms. Dash for seasoning. She usually drinks water and coffee. She does use her CPAP every night. In the afternoons she usually needs to take a nap. She denies any lightheadedness, syncope, orthopnea, or PND.  Previous antihypertensives: Bystolic - nausea Clonidine - "bad side effects", anxious, jittery Carvedilol - swelling Amlodipine-atorvastatin - salty taste, stomach burning, headache Chlorthalidone - headache Diltiazem - dizziness Lisinopril - backache Metoprolol  tartrate - nausea, headache, dizziness Spironolactone - diarrhea Hydralazine - chest heaviness, SOB, dizziness, unable to sleep after 1 tablet Labetolol - side effects  Doxazosin - did not tolerate Entresto - dizziness Valsartan - dizziness, memory issues, knee pain, fatigue Losartan HCTZ - ankle swelling Chlorthalidone - nausea Minoxidil  Past  Medical History:  Diagnosis Date   Anemia    Back pain    Benign positional vertigo    Cardiomyopathy (HCC)    Carpal tunnel syndrome    Goiter    nontoxid mutinodular   Hypertension    Hyperthyroidism    Insulin resistance syndrome    Menopausal disorder    Menorrhagia    Migraine    Muscle spasm    trapezius   Neck pain, acute    Obesity    Seborrheic dermatitis    Thyroid disorder     Past Surgical History:  Procedure Laterality Date   CHOLECYSTECTOMY     [place]   DILATION AND CURETTAGE OF UTERUS  03/23/2008   ENDOMETRIAL ABLATION  03/23/2008   GREAT TOE ARTHRODESIS, METATARSALPHALANGEAL JOINT  05/23/2006   Rt foot   HEEL SPUR SURGERY  10/23/1986   Lt foot   ICD GENERATOR CHANGEOUT N/A 01/18/2022   Procedure: ICD GENERATOR CHANGEOUT;  Surgeon: Regan Lemming, MD;  Location: [facility] INVASIVE CV LAB;  Service: Cardiovascular;  Laterality: N/A;   TUBAL LIGATION  10/23/1989    Current Medications: Current Meds  Medication Sig   acetaminophen (TYLENOL) 325 MG tablet Take 650 mg by mouth every 6 (six) hours as needed.   Ascorbic Acid (VITAMIN C) 1000 MG tablet Take 1,000 mg by mouth daily.     aspirin 81 MG EC tablet Take 81 mg by mouth daily.   Bioflavonoid Products (BIOFLEX PO) Take 2 tablets by mouth daily.   Biotin 09811 MCG TABS Take 10,000 mcg by mouth daily.   Blood Pressure KIT 1 kit by Does not apply route daily.   Calcium Carb-Cholecalciferol 600-20 MG-MCG TABS Take 1 tablet by mouth daily.   COLLAGEN PO Take 3 capsules by mouth daily.   cyclobenzaprine (FLEXERIL) 10 MG tablet Take 0.5-1 tablets (5-10 mg total) by mouth 3 (three) times daily as needed for muscle spasms. Caution: can cause drowsiness   ferrous sulfate 325 (65 FE) MG tablet Take 325 mg by mouth every other day.   flecainide (TAMBOCOR) 100 MG tablet Take 1 tablet (100 mg total) by mouth 2 (two) times daily.   Fluocinolone Acetonide Scalp (DERMA-SMOOTHE/FS SCALP) 0.01 % OIL Apply 1  application topically daily.   furosemide (LASIX) 20 MG tablet Take 2 tablets (40 mg total) by mouth daily.   guaiFENesin-codeine (ROBITUSSIN AC) 100-10 MG/5ML syrup Take 5 mLs by mouth 3 (three) times daily as needed for cough.   Lactobacillus (PROBIOTIC ACIDOPHILUS PO) Take by mouth.   losartan (COZAAR) 100 MG tablet Take 1 tablet (100 mg total) by mouth daily.   Multiple Minerals-Vitamins (GNP CAL MAG ZINC +D3 PO) Take 3 tablets by mouth daily.   Multiple Vitamin (MULTIVITAMIN) tablet Take 1 tablet by mouth daily.     NON FORMULARY Take by mouth daily. 2 tablespoons olive oil   Red Clover Leaf Extract 500 MG TABS Take 500 mg by mouth daily.   vitamin B-12 (CYANOCOBALAMIN) 1000 MCG tablet Take 1,000 mcg by mouth daily.   vitamin E 180 MG (400 UNITS) capsule Take 400 Units by mouth daily.     Allergies:   Hydralazine, Amoxicillin, Duloxetine, Gabapentin, Latex, Bystolic [  nebivolol hcl], Clonidine derivatives, Coreg [carvedilol], Estradiol, Amlodipine-atorvastatin, Chlorthalidone, Diltiazem hcl, Lisinopril, Metoprolol tartrate, Simvastatin, and Spironolactone   Social History   Socioeconomic History   Marital status: Divorced    Spouse name: Not on file   Number of children: 3   Years of education: 14   Highest education level: Some college, no degree  Occupational History   Occupation: unemployed    Employer: Statistician   Occupation: legally disabled  Tobacco Use   Smoking status: Former    Current packs/day: 0.00    Types: Cigarettes    Quit date: 09/22/2004    Years since quitting: 18.8   Smokeless tobacco: Never  Vaping Use   Vaping status: Never Used  Substance and Sexual Activity   Alcohol use: Not Currently    Alcohol/week: 1.0 standard drink of alcohol    Types: 1 Standard drinks or equivalent per week    Comment: 1 weekly   Drug use: No   Sexual activity: Yes    Birth control/protection: Post-menopausal  Other Topics Concern   Not on file  Social History Narrative    Lives alone. She has three children. She enjoys watching television, cooking, baking and sewing.   Social Determinants of Health   Financial Resource Strain: Low Risk  (04/25/2023)   Overall Financial Resource Strain (CARDIA)    Difficulty of Paying Living Expenses: Not hard at all  Food Insecurity: No Food Insecurity (04/25/2023)   Hunger Vital Sign    Worried About Running Out of Food in the Last Year: Never true    Ran Out of Food in the Last Year: Never true  Recent Concern: Food Insecurity - Food Insecurity Present (01/27/2023)   Hunger Vital Sign    Worried About Running Out of Food in the Last Year: Sometimes true    Ran Out of Food in the Last Year: Never true  Transportation Needs: Unmet Transportation Needs (06/16/2023)   PRAPARE - Administrator, Civil Service (Medical): Yes    Lack of Transportation (Non-Medical): Yes  Physical Activity: Insufficiently Active (04/25/2023)   Exercise Vital Sign    Days of Exercise per Week: 2 days    Minutes of Exercise per Session: 60 min  Stress: No Stress Concern Present (05/09/2023)   Received from [facility]   [employer] of Occupational Health - Occupational Stress Questionnaire    Feeling of Stress : Only a little  Social Connections: Socially Isolated (04/25/2023)   Social Connection and Isolation Panel [NHANES]    Frequency of Communication with Friends and Family: More than three times a week    Frequency of Social Gatherings with Friends and Family: Once a week    Attends Religious Services: Never    Database administrator or Organizations: No    Attends Engineer, structural: Never    Marital Status: Divorced     Family History: The patient's family history includes Diabetes in an other family member; Hypertension in her father and sister; Hypothyroidism in her sister; Other in her mother.  ROS:   Please see the history of present illness.    (+) Intermittent chest pains (+) Shortness  of breath (+) LE edema (+) Back pain All other systems reviewed and are negative.  EKGs/Labs/Other Studies Reviewed:    Bilateral Renal Artery Dopplers  06/17/2023: Summary:  Largest Aortic Diameter: 2.6 cm    Renal:    Right: Normal size right kidney. Normal right Resistive Index.  Normal cortical thickness of right kidney. No evidence of         right renal artery stenosis. RRV flow present. Cyst(s) noted.         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  Left:  Normal size of left kidney. Normal left Resistive Index.         Normal cortical thickness of the left kidney. No evidence of         left renal artery stenosis. LRV flow present.  Mesenteric:  Normal Celiac artery and Superior Mesenteric artery findings.    Patent IVC.   Echo  06/03/2023:  1. Left ventricular ejection fraction, by estimation, is 40 to 45%. The  left ventricle has mildly decreased function. The left ventricle has no  regional wall motion abnormalities. There is mild concentric left  ventricular hypertrophy. Left ventricular  diastolic parameters are consistent with Grade II diastolic dysfunction  (pseudonormalization).   2. Right ventricular systolic function is normal. The right ventricular  size is normal. There is mildly elevated pulmonary artery systolic  pressure. The estimated right ventricular systolic pressure is 39.6 mmHg.   3. Left atrial size was moderately dilated.   4. The mitral valve is normal in structure. Mild mitral valve  regurgitation. No evidence of mitral stenosis.   5. The aortic valve cusps are not well visualized, however, there is at  least moderate aortic valve regurgitation that appears to be originating  at the right coronary cusp. Consider TEE for further evaluation.   6. Aortic dilatation noted. There is borderline dilatation of the  ascending aorta, measuring 39 mm.   7. The inferior vena cava is  dilated in size with <50% respiratory  variability, suggesting right atrial pressure of 15 mmHg.   24 HR Blood Pressure Monitor  03/2023:   Overall average BP 205/67mmHg   Average BP while awake 201/16mmHg   Average BP while asleep 208/24mmHg   100% of SBPs were >180mmHg while awake and >151mmHg while asleep.   35% of SBPs were >13mmHg while awake and >80Hg while asleep.   EKG:  EKG is personally reviewed. 07/16/2023: Not ordered.  Recent Labs: 05/02/2023: NT-Pro BNP 317 05/29/2023: TSH 3.430 06/13/2023: BUN 14; Creatinine, Ser 0.94; Hemoglobin 13.7; Platelets 292; Potassium 3.8; Sodium 141   Recent Lipid Panel    Component Value Date/Time   CHOL 225 (H) 04/11/2022 1601   TRIG 173 (H) 04/11/2022 1601   HDL 49 (L) 04/11/2022 1601   CHOLHDL 4.6 04/11/2022 1601   VLDL 22 03/11/2007 1943   LDLCALC 145 (H) 04/11/2022 1601    Physical Exam:    VS:  BP (!) 198/76 (BP Location: Right Arm, Patient Position: Sitting, Cuff Size: Large)   Pulse 71   Ht 5\' 4"  (1.626 m)   Wt 259 lb (117.5 kg)   LMP 09/27/2011   BMI 44.46 kg/m  , BMI Body mass index is 44.46 kg/m. GENERAL:  Well appearing HEENT: Pupils equal round and reactive, fundi not visualized, oral mucosa unremarkable NECK:  No jugular venous distention, waveform within normal limits, carotid upstroke brisk and symmetric, no bruits, no thyromegaly LUNGS:  Clear to auscultation bilaterally HEART:  RRR.  PMI not displaced or sustained, S1 and S2 within normal limits, no S3, no S4, no clicks, no rubs, no murmurs ABD:  Flat, positive bowel sounds normal in frequency in pitch, no bruits, no  rebound, no guarding, no midline pulsatile mass, no hepatomegaly, no splenomegaly EXT:  2 plus pulses throughout, no edema, no cyanosis, no clubbing SKIN:  No rashes, no nodules NEURO:  Cranial nerves II through XII grossly intact, motor grossly intact throughout PSYCH:  Cognitively intact, oriented to person place and time   ASSESSMENT/PLAN:        # Uncontrolled Hypertension: Struggling with medication side effects, including fatigue, headaches, and edema. Discussed the role of kidneys in blood pressure regulation and the potential benefit of renal denervation procedure. -Recommend proceeding with renal denervation procedure pending results of cardiac CT.    # Heart Failure with Reduced Ejection Fraction (HFrEF): Ejection fraction 40-45%, experiencing increased shortness of breath. Discussed the role of fluid management in heart failure symptoms.  She appears euvolemic on exam -Check B-type Natriuretic Peptide (BNP) to assess fluid status. -Consider increasing Lasix dose depending on BNP results. - Continue losartan.  Unable to tolerate other GDMT as above.    # Atypical CP:  Symptoms are very atypical.  Will get coronary CT-A to assess.  She is unable to walk on a treadmill.  Continue aspirin.    # L Flank pain: Complaints of frequent urination and L flank pain, particularly after taking Lasix. -Order urinalysis to rule out urinary tract infection.  # PVCs; Managed on flecainide.  Follow-up: Plan to follow up after renal denervation procedure (if pursued) and again in 3-4 months to assess blood pressure control.      Informed Consent   Shared Decision Making/Informed Consent The risks [stroke (1 in 1000), death (1 in 1000), kidney failure [usually temporary] (1 in 500), bleeding (1 in 200), allergic reaction [possibly serious] (1 in 200)], benefits (diagnostic support and management of coronary artery disease) and alternatives of a renal denervation were discussed in detail with Ms. Cornforth and she is willing to proceed.      Screening for Secondary Hypertension:     05/29/2023    1:21 PM  Causes  Drugs/Herbals Screened     - Comments 05/29/23 encouraged to stop OTC agents  Renovascular HTN Screened     - Comments 05/29/23 renal duplex ordered  Sleep Apnea Screened     - Comments wearing CPAP  Thyroid Disease Screened     -  Comments 05/29/23 TSH ordered  Pheochromocytoma Screened     - Comments Prior CT 2022 normal adrenals  Coarctation of the Aorta Screened     - Comments 03/2022 CT no significant carotid stenosis  Compliance Screened     - Comments History of self-adjusting medications    Relevant Labs/Studies:    Latest Ref Rng & Units 06/13/2023    2:45 PM 05/29/2023   10:57 AM 05/02/2023    4:18 PM  Basic Labs  Sodium 135 - 145 mmol/L 141  146  142   Potassium 3.5 - 5.1 mmol/L 3.8  4.6  4.4   Creatinine 0.44 - 1.00 mg/dL 8.29  5.62  1.30        Latest Ref Rng & Units 05/29/2023   10:57 AM 04/11/2022    4:01 PM  Thyroid   TSH 0.450 - 4.500 uIU/mL 3.430  1.89                 06/17/2023   10:47 AM  Renovascular   Renal Artery [place] Completed Yes     Disposition:    FU with Tiffany C. Duke Salvia, MD, [facility] in 4 months.  Medication Adjustments/Labs and Tests Ordered: Current medicines  are reviewed at length with the patient today.  Concerns regarding medicines are outlined above.   No orders of the defined types were placed in this encounter.  No orders of the defined types were placed in this encounter.  I,Mathew Stumpf,acting as a Neurosurgeon for Chilton Si, MD.,have documented all relevant documentation on the behalf of Chilton Si, MD,as directed by  Chilton Si, MD while in the presence of Chilton Si, MD.  I, Tiffany C. Duke Salvia, MD have reviewed all documentation for this visit.  The documentation of the exam, diagnosis, procedures, and orders on 07/16/2023 are all accurate and complete.   Guadalupe Maple  07/16/2023 12:42 PM    [facility]

## 2023-07-17 ENCOUNTER — Telehealth: Payer: Self-pay | Admitting: *Deleted

## 2023-07-17 DIAGNOSIS — I1A0 Resistant hypertension: Secondary | ICD-10-CM

## 2023-07-17 LAB — URINALYSIS
Glucose, UA: NEGATIVE
Ketones, UA: NEGATIVE
Leukocytes,UA: NEGATIVE
Nitrite, UA: NEGATIVE
RBC, UA: NEGATIVE
Urobilinogen, Ur: 0.2 mg/dL (ref 0.2–1.0)

## 2023-07-17 LAB — BASIC METABOLIC PANEL
BUN/Creatinine Ratio: 14 (ref 12–28)
Chloride: 106 mmol/L (ref 96–106)
Creatinine, Ser: 0.93 mg/dL (ref 0.57–1.00)
Glucose: 110 mg/dL — ABNORMAL HIGH (ref 70–99)
Potassium: 4.7 mmol/L (ref 3.5–5.2)
eGFR: 69 mL/min/{1.73_m2} (ref >59)

## 2023-07-17 LAB — BRAIN NATRIURETIC PEPTIDE: BNP: 114.8 pg/mL — ABNORMAL HIGH (ref 0.0–100.0)

## 2023-07-17 NOTE — Telephone Encounter (Signed)
Call placed to the patient to discuss the Renal Denervation procedure. This has been approved through her insurance. The plan is to get the procedure scheduled for 8/21 at 10:30.   Per her last office note, the patient will need a cardiac ct prior. Message sent to Dr. Idelia Salm nurse to help expedite the test.   The patient would also like to speak with Dr. Kirke Corin prior to having the procedure schedule.

## 2023-07-17 NOTE — Telephone Encounter (Signed)
Hey ladies anyway to get this patient's CT expedited, trying to get her cleared for another procedure?

## 2023-07-17 NOTE — Telephone Encounter (Signed)
-----   Message from [place] [facility] sent at 07/16/2023  3:28 PM EDT ----- I just verified and auth is on file. ----- Message ----- From: Burnell Blanks, LPN Sent: 1/61/0960   1:51 PM EDT To: Florina Ou; Sandi Mariscal, RN  Hello all  I think this patient has been approved for renal denervation and just needs to be scheduled.   Thanks   [employer]

## 2023-07-18 ENCOUNTER — Ambulatory Visit: Payer: 59

## 2023-07-18 DIAGNOSIS — I428 Other cardiomyopathies: Secondary | ICD-10-CM | POA: Diagnosis not present

## 2023-07-18 LAB — CUP PACEART REMOTE DEVICE CHECK
Battery Remaining Longevity: 68 mo
Battery Remaining Percentage: 80 %
Battery Voltage: 2.98 V
Brady Statistic AP VP Percent: 45 %
Brady Statistic AP VS Percent: 1 %
Brady Statistic AS VP Percent: 55 %
Brady Statistic AS VS Percent: 1 %
Brady Statistic RA Percent Paced: 44 %
Date Time Interrogation Session: 20240726020055
HighPow Impedance: 65 Ohm
Implantable Lead Connection Status: 753985
Implantable Lead Connection Status: 753985
Implantable Lead Connection Status: 753985
Implantable Lead Implant Date: 20150303
Implantable Lead Implant Date: 20150303
Implantable Lead Implant Date: 20150303
Implantable Lead Location: 753858
Implantable Lead Location: 753859
Implantable Lead Location: 753860
Implantable Pulse Generator Implant Date: 20230127
Lead Channel Impedance Value: 390 Ohm
Lead Channel Impedance Value: 440 Ohm
Lead Channel Impedance Value: 500 Ohm
Lead Channel Pacing Threshold Amplitude: 0.625 V
Lead Channel Pacing Threshold Amplitude: 1.375 V
Lead Channel Pacing Threshold Amplitude: 2 V
Lead Channel Pacing Threshold Pulse Width: 0.5 ms
Lead Channel Pacing Threshold Pulse Width: 0.5 ms
Lead Channel Pacing Threshold Pulse Width: 0.7 ms
Lead Channel Sensing Intrinsic Amplitude: 12 mV
Lead Channel Sensing Intrinsic Amplitude: 2.9 mV
Lead Channel Setting Pacing Amplitude: 1.625
Lead Channel Setting Pacing Amplitude: 2.375
Lead Channel Setting Pacing Amplitude: 2.5 V
Lead Channel Setting Pacing Pulse Width: 0.5 ms
Lead Channel Setting Pacing Pulse Width: 0.7 ms
Lead Channel Setting Sensing Sensitivity: 0.5 mV
Pulse Gen Serial Number: 111057141
Zone Setting Status: 755011

## 2023-07-18 NOTE — Telephone Encounter (Signed)
I called the patient and discussed renal denervation procedure in details as well as risk and benefits.  She is agreeable to proceed on August 21.

## 2023-07-18 NOTE — Telephone Encounter (Signed)
Patient has been called and instructed on the procedure. Instructions will also be sent to MyChart.

## 2023-07-22 NOTE — Telephone Encounter (Signed)
Patient having CT 8/1

## 2023-07-23 ENCOUNTER — Encounter (HOSPITAL_COMMUNITY): Payer: Self-pay

## 2023-07-23 ENCOUNTER — Telehealth: Payer: Self-pay | Admitting: Cardiology

## 2023-07-23 NOTE — Telephone Encounter (Signed)
Pt called wanting to confirm instructions for CTA. Informed pt to eat 4 hours before her CTA, take the Metoprolol 2 hours before procedure as directed. Only clear liquids up until procedure. Pt is states she needs labs done so she is going to do those before the procedure as well.

## 2023-07-23 NOTE — Telephone Encounter (Signed)
Patient is calling with question about what medication she is suppose to take for procedure tomorrow. Please advise

## 2023-07-24 ENCOUNTER — Ambulatory Visit (HOSPITAL_BASED_OUTPATIENT_CLINIC_OR_DEPARTMENT_OTHER): Payer: 59 | Admitting: Family

## 2023-07-24 ENCOUNTER — Encounter (HOSPITAL_COMMUNITY): Payer: Self-pay

## 2023-07-24 ENCOUNTER — Ambulatory Visit (HOSPITAL_COMMUNITY)
Admission: RE | Admit: 2023-07-24 | Discharge: 2023-07-24 | Disposition: A | Discharge status: Home / Self Care | Payer: 59 | Source: Ambulatory Visit | Attending: Cardiovascular Disease | Admitting: Cardiovascular Disease

## 2023-07-24 DIAGNOSIS — R072 Precordial pain: Secondary | ICD-10-CM | POA: Insufficient documentation

## 2023-07-24 DIAGNOSIS — I493 Ventricular premature depolarization: Secondary | ICD-10-CM | POA: Insufficient documentation

## 2023-07-24 DIAGNOSIS — I10 Essential (primary) hypertension: Secondary | ICD-10-CM | POA: Insufficient documentation

## 2023-07-24 MED ORDER — NITROGLYCERIN 0.4 MG SL SUBL
0.8000 mg | SUBLINGUAL_TABLET | SUBLINGUAL | Status: DC | PRN
  Administered 2023-07-24: 0.8 mg via SUBLINGUAL

## 2023-07-24 MED ORDER — IOHEXOL 350 MG/ML SOLN
100.0000 mL | Freq: Once | INTRAVENOUS | Status: AC | PRN
  Administered 2023-07-24: 100 mL via INTRAVENOUS

## 2023-07-24 MED ORDER — NITROGLYCERIN 0.4 MG SL SUBL
SUBLINGUAL_TABLET | SUBLINGUAL | Status: AC
  Filled 2023-07-24: qty 2

## 2023-07-25 NOTE — Progress Notes (Signed)
Remote ICD transmission.   

## 2023-07-27 DIAGNOSIS — G4733 Obstructive sleep apnea (adult) (pediatric): Secondary | ICD-10-CM | POA: Diagnosis not present

## 2023-07-29 ENCOUNTER — Other Ambulatory Visit (HOSPITAL_BASED_OUTPATIENT_CLINIC_OR_DEPARTMENT_OTHER): Payer: Self-pay | Admitting: *Deleted

## 2023-07-29 DIAGNOSIS — I10 Essential (primary) hypertension: Secondary | ICD-10-CM

## 2023-07-29 DIAGNOSIS — E782 Mixed hyperlipidemia: Secondary | ICD-10-CM

## 2023-07-31 ENCOUNTER — Other Ambulatory Visit: Payer: Self-pay

## 2023-07-31 DIAGNOSIS — I1A0 Resistant hypertension: Secondary | ICD-10-CM

## 2023-07-31 MED ORDER — LOSARTAN POTASSIUM 100 MG PO TABS
100.0000 mg | ORAL_TABLET | Freq: Every day | ORAL | 3 refills | Status: DC
Start: 2023-07-31 — End: 2023-11-10

## 2023-08-05 DIAGNOSIS — I10 Essential (primary) hypertension: Secondary | ICD-10-CM | POA: Diagnosis not present

## 2023-08-05 DIAGNOSIS — E782 Mixed hyperlipidemia: Secondary | ICD-10-CM | POA: Diagnosis not present

## 2023-08-12 ENCOUNTER — Telehealth: Payer: Self-pay | Admitting: *Deleted

## 2023-08-12 NOTE — Progress Notes (Signed)
HPI: FU CM and hypertension.  Previously followed by Mayra Reel, MD in [place]. She has a history of echocardiogram in 2014 showed ejection fraction 25 to 30% with left bundle branch block.  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  She had CRT-D in 2015. Echocardiogram repeated May 2021 showed ejection fraction 55 to 60%.  Her PVCs are now treated with flecainide. Patient previously moved back to this area from [place] state.  Follow-up echocardiogram here September 2022 showed ejection fraction 45 to 50%, moderate left ventricular hypertrophy, grade 1 diastolic dysfunction, mild mitral regurgitation. Patient had ICD generator change January 2023.  Note by report patient did not tolerate carvedilol, Toprol, Entresto, spironolactone, Cardura and recently discontinued labetalol.  She is intolerant to statins.  Sleep study 12/23 with moderate OSA and O2 sats as low as 76. Echo 6/24 showed EF 40-45, mild LVH, grade 2 DD, moderate LAE, mild MR, moderate AI and dilated ascending aorta 39 mm.  Renal Dopplers June 2024 showed no renal artery stenosis.  Coronary CTA August 2024 showed calcium score 38.9 which was 73rd percentile and minimal coronary artery disease.  Renal denervation for her hypertension performed 08/13/23.  Since last seen the patient denies any dyspnea on exertion, orthopnea, PND, pedal edema, palpitations, syncope or chest pain.   Current Outpatient Medications  Medication Sig Dispense Refill   acetaminophen (TYLENOL) 500 MG tablet Take 1,000 mg by mouth every 6 (six) hours as needed for moderate pain.     aspirin 81 MG EC tablet Take 81 mg by mouth daily.     Biotin 96045 MCG TABS Take 10,000 mcg by mouth daily.     Blood Pressure KIT 1 kit by Does not apply route daily. 1 kit 0   Calcium Carb-Cholecalciferol 600-20 MG-MCG TABS Take 1 tablet by mouth daily.     COLLAGEN PO Take 1 capsule by mouth daily. With vitamin c     cyclobenzaprine  (FLEXERIL) 10 MG tablet Take 0.5-1 tablets (5-10 mg total) by mouth 3 (three) times daily as needed for muscle spasms. Caution: can cause drowsiness 60 tablet 1   ferrous sulfate 325 (65 FE) MG tablet Take 325 mg by mouth daily.     flecainide (TAMBOCOR) 100 MG tablet Take 1 tablet (100 mg total) by mouth 2 (two) times daily. 180 tablet 3   furosemide (LASIX) 20 MG tablet Take 2 tablets (40 mg total) by mouth daily. (Patient taking differently: Take 60 mg by mouth daily.) 180 tablet 3   Ginkgo Biloba 60 MG CAPS Take 60 mg by mouth daily.     Glucosamine-Chondroitin (OSTEO BI-FLEX REGULAR STRENGTH PO) Take 2 tablets by mouth daily.     HYDROGEN PEROXIDE EX Apply 1 Application topically daily as needed (scalp itching).     losartan (COZAAR) 100 MG tablet Take 1 tablet (100 mg total) by mouth daily. 90 tablet 3   Lutein 20 MG TABS Take 20 mg by mouth daily.     Multiple Vitamin (MULTIVITAMIN) tablet Take 1 tablet by mouth daily.       Multiple Vitamins-Minerals (EMERGEN-C VITAMIN C) PACK Take 1 packet by mouth daily.     Phenylephrine HCl (SINEX REGULAR NA) Place 1 spray into the nose daily.     Probiotic Product (PROBIOTIC PO) Take 2 capsules by mouth daily.     RED CLOVER LEAF EXTRACT PO Take 1 capsule by mouth daily.     vitamin B-12 (CYANOCOBALAMIN) 1000  MCG tablet Take 1,000 mcg by mouth daily.     vitamin E 180 MG (400 UNITS) capsule Take 400 Units by mouth daily.     No current facility-administered medications for this visit.     Past Medical History:  Diagnosis Date   Anemia    Back pain    Benign positional vertigo    Cardiomyopathy (HCC)    Carpal tunnel syndrome    Goiter    nontoxid mutinodular   Hypertension    Hyperthyroidism    Insulin resistance syndrome    Menopausal disorder    Menorrhagia    Migraine    Muscle spasm    trapezius   Neck pain, acute    Obesity    Seborrheic dermatitis    Thyroid disorder     Past Surgical History:  Procedure Laterality Date    CHOLECYSTECTOMY     [place]   DILATION AND CURETTAGE OF UTERUS  03/23/2008   ENDOMETRIAL ABLATION  03/23/2008   GREAT TOE ARTHRODESIS, METATARSALPHALANGEAL JOINT  05/23/2006   Rt foot   HEEL SPUR SURGERY  10/23/1986   Lt foot   ICD GENERATOR CHANGEOUT N/A 01/18/2022   Procedure: ICD GENERATOR CHANGEOUT;  Surgeon: Regan Lemming, MD;  Location: [facility] INVASIVE CV LAB;  Service: Cardiovascular;  Laterality: N/A;   RENAL DENERVATION Bilateral 08/13/2023   Procedure: RENAL DENERVATION;  Surgeon: Iran Ouch, MD;  Location: [facility] INVASIVE CV LAB;  Service: Cardiovascular;  Laterality: Bilateral;   TUBAL LIGATION  10/23/1989    Social History   Socioeconomic History   Marital status: Divorced    Spouse name: Not on file   Number of children: 3   Years of education: 14   Highest education level: Some college, no degree  Occupational History   Occupation: unemployed    Employer: Statistician   Occupation: legally disabled  Tobacco Use   Smoking status: Former    Current packs/day: 0.00    Types: Cigarettes    Quit date: 09/22/2004    Years since quitting: 18.9   Smokeless tobacco: Never  Vaping Use   Vaping status: Never Used  Substance and Sexual Activity   Alcohol use: Not Currently    Alcohol/week: 1.0 standard drink of alcohol    Types: 1 Standard drinks or equivalent per week    Comment: 1 weekly   Drug use: No   Sexual activity: Yes    Birth control/protection: Post-menopausal  Other Topics Concern   Not on file  Social History Narrative   Lives alone. She has three children. She enjoys watching television, cooking, baking and sewing.   Social Determinants of Health   Financial Resource Strain: Low Risk  (04/25/2023)   Overall Financial Resource Strain (CARDIA)    Difficulty of Paying Living Expenses: Not hard at all  Food Insecurity: No Food Insecurity (04/25/2023)   Hunger Vital Sign    Worried About Running Out of Food in the Last Year: Never true    Ran Out  of Food in the Last Year: Never true  Recent Concern: Food Insecurity - Food Insecurity Present (01/27/2023)   Hunger Vital Sign    Worried About Running Out of Food in the Last Year: Sometimes true    Ran Out of Food in the Last Year: Never true  Transportation Needs: Unmet Transportation Needs (06/16/2023)   PRAPARE - Transportation    Lack of Transportation (Medical): Yes    Lack of Transportation (Non-Medical): Yes  Physical Activity: Insufficiently Active (04/25/2023)  Exercise Vital Sign    Days of Exercise per Week: 2 days    Minutes of Exercise per Session: 60 min  Stress: No Stress Concern Present (05/09/2023)   Received from [facility], [facility] of Occupational Health - Occupational Stress Questionnaire    Feeling of Stress : Only a little  Social Connections: Socially Isolated (04/25/2023)   Social Connection and Isolation Panel [NHANES]    Frequency of Communication with Friends and Family: More than three times a week    Frequency of Social Gatherings with Friends and Family: Once a week    Attends Religious Services: Never    Database administrator or Organizations: No    Attends Banker Meetings: Never    Marital Status: Divorced  Catering manager Violence: Not At Risk (05/09/2023)   Received from [employer], [facility]   HITS    Over the last 12 months how often did your partner physically hurt you?: 1    Over the last 12 months how often did your partner insult you or talk down to you?: 1    Over the last 12 months how often did your partner threaten you with physical harm?: 1    Over the last 12 months how often did your partner scream or curse at you?: 1    Family History  Problem Relation Age of Onset   Other Mother        MS   Hypertension Father    Hypothyroidism Sister    Hypertension Sister    Diabetes Other     ROS: no fevers or chills, productive cough, hemoptysis, dysphasia, odynophagia, melena,  hematochezia, dysuria, hematuria, rash, seizure activity, orthopnea, PND, pedal edema, claudication. Remaining systems are negative.  Physical Exam: Well-developed well-nourished in no acute distress.  Skin is warm and dry.  HEENT is normal.  Neck is supple.  Chest is clear to auscultation with normal expansion.  Cardiovascular exam is regular rate and rhythm.  Abdominal exam nontender or distended. No masses palpated. Extremities show no edema. neuro grossly intact  A/P  1 nonischemic cardiomyopathy-continue ARB.  Previously did not tolerate ACE inhibitor, Entresto or any beta-blocker.  2 hypertension-multiple medication intolerances including all beta-blockers, Entresto, amlodipine, Cardizem, clonidine, hydralazine, minoxidil and Cardura.  She is status post renal denervation on August 21.  Blood pressure is much improved today compared to last office visit.  Hopefully this will continue to decrease over time.  Will continue present medications.  3 CRT-D-per Dr. Elberta Fortis.  4 elevated calcium score-continue aspirin.  She is intolerant to statins.  5 hyperlipidemia-intolerant to statins.  Not clear that she would tolerate any medications given her history.  6 history of PVCs-LV function is mildly reduced but minimal plaque noted on CTA.  I have elected to continue flecainide for now though if her LV function decreases further we will likely need to discontinue.  7 aortic insufficiency-she will need follow-up echoes in the future.  8 obstructive sleep apnea-continue CPAP.  Olga Millers, MD

## 2023-08-12 NOTE — Telephone Encounter (Signed)
Renal Denervation scheduled at [facility] for: Wednesday August 13, 2023 10:30 AM Arrival time [facility] Main Entrance A at: 8 AM-needs CBC  Nothing to eat after midnight prior to procedure, clear liquids until 5 AM day of procedure  Medication instructions: -Hold:  Lasix-AM of procedure -Other usual morning medications can be taken with sips of water including aspirin 81 mg.  Plan to go home the same day, you will only stay overnight if medically necessary.  You must have responsible adult to drive you home.  Someone must be with you the first 24 hours after you arrive home.  Reviewed procedure instructions with patient.

## 2023-08-13 ENCOUNTER — Other Ambulatory Visit: Payer: Self-pay

## 2023-08-13 ENCOUNTER — Ambulatory Visit (HOSPITAL_COMMUNITY)
Admission: RE | Disposition: A | Discharge status: Home / Self Care | Payer: Self-pay | Source: Home / Self Care | Attending: Cardiovascular Disease

## 2023-08-13 ENCOUNTER — Ambulatory Visit (HOSPITAL_COMMUNITY)
Admission: RE | Admit: 2023-08-13 | Discharge: 2023-08-14 | Disposition: A | Discharge status: Home / Self Care | Payer: 59 | Attending: Cardiovascular Disease | Admitting: Cardiovascular Disease

## 2023-08-13 DIAGNOSIS — I110 Hypertensive heart disease with heart failure: Secondary | ICD-10-CM | POA: Diagnosis not present

## 2023-08-13 DIAGNOSIS — Z87891 Personal history of nicotine dependence: Secondary | ICD-10-CM | POA: Diagnosis not present

## 2023-08-13 DIAGNOSIS — I493 Ventricular premature depolarization: Secondary | ICD-10-CM | POA: Diagnosis not present

## 2023-08-13 DIAGNOSIS — G4733 Obstructive sleep apnea (adult) (pediatric): Secondary | ICD-10-CM | POA: Diagnosis not present

## 2023-08-13 DIAGNOSIS — I1A0 Resistant hypertension: Secondary | ICD-10-CM | POA: Diagnosis not present

## 2023-08-13 DIAGNOSIS — R109 Unspecified abdominal pain: Secondary | ICD-10-CM | POA: Diagnosis not present

## 2023-08-13 DIAGNOSIS — R0789 Other chest pain: Secondary | ICD-10-CM | POA: Insufficient documentation

## 2023-08-13 DIAGNOSIS — I10 Essential (primary) hypertension: Secondary | ICD-10-CM

## 2023-08-13 DIAGNOSIS — I5042 Chronic combined systolic (congestive) and diastolic (congestive) heart failure: Secondary | ICD-10-CM | POA: Diagnosis not present

## 2023-08-13 DIAGNOSIS — R350 Frequency of micturition: Secondary | ICD-10-CM | POA: Insufficient documentation

## 2023-08-13 DIAGNOSIS — Z7982 Long term (current) use of aspirin: Secondary | ICD-10-CM | POA: Diagnosis not present

## 2023-08-13 DIAGNOSIS — Z01812 Encounter for preprocedural laboratory examination: Secondary | ICD-10-CM

## 2023-08-13 DIAGNOSIS — I2510 Atherosclerotic heart disease of native coronary artery without angina pectoris: Secondary | ICD-10-CM | POA: Diagnosis not present

## 2023-08-13 DIAGNOSIS — I428 Other cardiomyopathies: Secondary | ICD-10-CM | POA: Diagnosis not present

## 2023-08-13 DIAGNOSIS — M549 Dorsalgia, unspecified: Secondary | ICD-10-CM | POA: Diagnosis not present

## 2023-08-13 DIAGNOSIS — Z95810 Presence of automatic (implantable) cardiac defibrillator: Secondary | ICD-10-CM | POA: Insufficient documentation

## 2023-08-13 DIAGNOSIS — Z79899 Other long term (current) drug therapy: Secondary | ICD-10-CM | POA: Insufficient documentation

## 2023-08-13 HISTORY — PX: RENAL DENERVATION: CATH118329

## 2023-08-13 LAB — GLUCOSE, CAPILLARY: Glucose-Capillary: 101 mg/dL — ABNORMAL HIGH (ref 70–99)

## 2023-08-13 LAB — CBC
HCT: 40 % (ref 36.0–46.0)
Hemoglobin: 13 g/dL (ref 12.0–15.0)
MCH: 31.1 pg (ref 26.0–34.0)
MCHC: 32.5 g/dL (ref 30.0–36.0)
MCV: 95.7 fL (ref 80.0–100.0)
Platelets: 269 10*3/uL (ref 150–400)
RBC: 4.18 MIL/uL (ref 3.87–5.11)
RDW: 12.3 % (ref 11.5–15.5)
WBC: 5.4 10*3/uL (ref 4.0–10.5)
nRBC: 0 % (ref 0.0–0.2)

## 2023-08-13 SURGERY — RENAL DENERVATION
Anesthesia: Moderate Sedation | Laterality: Bilateral

## 2023-08-13 MED ORDER — SODIUM CHLORIDE 0.9 % IV SOLN: INTRAVENOUS | Status: AC

## 2023-08-13 MED ORDER — NITROGLYCERIN 0.4 MG SL SUBL: 0.4000 mg | SUBLINGUAL_TABLET | SUBLINGUAL | Status: DC | PRN

## 2023-08-13 MED ORDER — MIDAZOLAM HCL 2 MG/2ML IJ SOLN
INTRAMUSCULAR | Status: DC | PRN
  Administered 2023-08-13: 1 mg via INTRAVENOUS

## 2023-08-13 MED ORDER — HEPARIN SODIUM (PORCINE) 1000 UNIT/ML IJ SOLN
INTRAMUSCULAR | Status: AC
  Filled 2023-08-13: qty 10

## 2023-08-13 MED ORDER — SODIUM CHLORIDE 0.9 % WEIGHT BASED INFUSION: 1.0000 mL/kg/h | INTRAVENOUS | Status: DC

## 2023-08-13 MED ORDER — SODIUM CHLORIDE 0.9 % WEIGHT BASED INFUSION
3.0000 mL/kg/h | INTRAVENOUS | Status: DC
  Administered 2023-08-13: 3 mL/kg/h via INTRAVENOUS

## 2023-08-13 MED ORDER — FENTANYL CITRATE (PF) 100 MCG/2ML IJ SOLN
INTRAMUSCULAR | Status: AC
  Filled 2023-08-13: qty 2

## 2023-08-13 MED ORDER — ASPIRIN 81 MG PO TBEC
81.0000 mg | DELAYED_RELEASE_TABLET | Freq: Every day | ORAL | Status: DC
  Administered 2023-08-14: 81 mg via ORAL
  Filled 2023-08-13: qty 1

## 2023-08-13 MED ORDER — FENTANYL CITRATE (PF) 100 MCG/2ML IJ SOLN
INTRAMUSCULAR | Status: DC | PRN
  Administered 2023-08-13: 50 ug via INTRAVENOUS

## 2023-08-13 MED ORDER — LIDOCAINE HCL (PF) 1 % IJ SOLN
INTRAMUSCULAR | Status: DC | PRN
  Administered 2023-08-13: 18 mL

## 2023-08-13 MED ORDER — HYDRALAZINE HCL 20 MG/ML IJ SOLN
INTRAMUSCULAR | Status: DC | PRN
  Administered 2023-08-13: 10 mg via INTRAVENOUS

## 2023-08-13 MED ORDER — HEPARIN (PORCINE) IN NACL 1000-0.9 UT/500ML-% IV SOLN
INTRAVENOUS | Status: DC | PRN
  Administered 2023-08-13 (×2): 500 mL

## 2023-08-13 MED ORDER — SODIUM CHLORIDE 0.9% FLUSH: 3.0000 mL | Freq: Two times a day (BID) | INTRAVENOUS | Status: DC

## 2023-08-13 MED ORDER — CYCLOBENZAPRINE HCL 5 MG PO TABS
5.0000 mg | ORAL_TABLET | Freq: Three times a day (TID) | ORAL | Status: DC | PRN
  Administered 2023-08-14: 10 mg via ORAL
  Filled 2023-08-13: qty 2

## 2023-08-13 MED ORDER — SODIUM CHLORIDE 0.9 % IV SOLN: 250.0000 mL | INTRAVENOUS | Status: DC | PRN

## 2023-08-13 MED ORDER — FERROUS SULFATE 325 (65 FE) MG PO TABS
325.0000 mg | ORAL_TABLET | Freq: Every day | ORAL | Status: DC
  Administered 2023-08-14: 325 mg via ORAL
  Filled 2023-08-13: qty 1

## 2023-08-13 MED ORDER — HYDRALAZINE HCL 20 MG/ML IJ SOLN
10.0000 mg | INTRAMUSCULAR | Status: DC | PRN
  Administered 2023-08-13: 10 mg via INTRAVENOUS
  Filled 2023-08-13: qty 1

## 2023-08-13 MED ORDER — LIDOCAINE HCL (PF) 1 % IJ SOLN
INTRAMUSCULAR | Status: AC
  Filled 2023-08-13: qty 30

## 2023-08-13 MED ORDER — LOSARTAN POTASSIUM 50 MG PO TABS
100.0000 mg | ORAL_TABLET | Freq: Every day | ORAL | Status: DC
  Administered 2023-08-14: 100 mg via ORAL
  Filled 2023-08-13: qty 2

## 2023-08-13 MED ORDER — ONDANSETRON HCL 4 MG/2ML IJ SOLN: 4.0000 mg | Freq: Four times a day (QID) | INTRAMUSCULAR | Status: DC | PRN

## 2023-08-13 MED ORDER — ACETAMINOPHEN 325 MG PO TABS
650.0000 mg | ORAL_TABLET | ORAL | Status: DC | PRN
  Administered 2023-08-13 – 2023-08-14 (×3): 650 mg via ORAL
  Filled 2023-08-13 (×3): qty 2

## 2023-08-13 MED ORDER — ASPIRIN 81 MG PO CHEW
81.0000 mg | CHEWABLE_TABLET | ORAL | Status: AC
  Administered 2023-08-13: 81 mg via ORAL

## 2023-08-13 MED ORDER — FUROSEMIDE 40 MG PO TABS
40.0000 mg | ORAL_TABLET | Freq: Every day | ORAL | Status: DC
  Administered 2023-08-14: 40 mg via ORAL
  Filled 2023-08-13: qty 1

## 2023-08-13 MED ORDER — FLECAINIDE ACETATE 100 MG PO TABS
100.0000 mg | ORAL_TABLET | Freq: Two times a day (BID) | ORAL | Status: DC
  Administered 2023-08-13 – 2023-08-14 (×2): 100 mg via ORAL
  Filled 2023-08-13 (×3): qty 1

## 2023-08-13 MED ORDER — SODIUM CHLORIDE 0.9 % WEIGHT BASED INFUSION: 3.0000 mL/kg/h | INTRAVENOUS | Status: AC

## 2023-08-13 MED ORDER — SODIUM CHLORIDE 0.9% FLUSH: 3.0000 mL | INTRAVENOUS | Status: DC | PRN

## 2023-08-13 MED ORDER — MIDAZOLAM HCL 2 MG/2ML IJ SOLN
INTRAMUSCULAR | Status: AC
  Filled 2023-08-13: qty 2

## 2023-08-13 MED ORDER — HYDRALAZINE HCL 20 MG/ML IJ SOLN
INTRAMUSCULAR | Status: AC
  Filled 2023-08-13: qty 1

## 2023-08-13 SURGICAL SUPPLY — 18 items
CATH ANGIO 5F PIGTAIL 65CM (CATHETERS) IMPLANT
CATH LAUNCH RDC 7FR 55 (CATHETERS) IMPLANT
CATH PARADISE 4.2 BALL (CATHETERS) IMPLANT
CATH PARADISE 5 BALL (CATHETERS) IMPLANT
COVER DOME SNAP 22 D (MISCELLANEOUS) IMPLANT
DEVICE CLOSURE MYNXGRIP 6/7F (Vascular Products) IMPLANT
KIT HEMO VALVE WATCHDOG (MISCELLANEOUS) IMPLANT
KIT MICROPUNCTURE NIT STIFF (SHEATH) IMPLANT
KIT SYRINGE INJ CVI SPIKEX1 (MISCELLANEOUS) IMPLANT
PARADISE ULTRASOUND PRICE (CATHETERS) IMPLANT
SET ATX-X65L (MISCELLANEOUS) IMPLANT
SHEATH PINNACLE 5F 10CM (SHEATH) IMPLANT
SHEATH PINNACLE 7F 10CM (SHEATH) IMPLANT
SHEATH PROBE COVER 6X72 (BAG) IMPLANT
TRAY PV CATH (CUSTOM PROCEDURE TRAY) IMPLANT
TUBING CIL FLEX 10 FLL-RA (TUBING) IMPLANT
WIRE HITORQ VERSACORE ST 145CM (WIRE) IMPLANT
WIRE SPARTACORE .014X190CM (WIRE) IMPLANT

## 2023-08-13 NOTE — Progress Notes (Signed)
Asked Dr. Gwenyth Bouillon via messenger if he wanted foley catheter to remain.  He instructed to leave foley in.  Notified Dr. Rachel Moulds that pt needs an order for the urinary catheter, if he wishes for it to remain.

## 2023-08-13 NOTE — Interval H&P Note (Signed)
History and Physical Interval Note:  08/13/2023 11:27 AM  Alice Stewart  has presented today for surgery, with the diagnosis of Resistant hypertension.  The various methods of treatment have been discussed with the patient and family. After consideration of risks, benefits and other options for treatment, the patient has consented to  Procedure(s): RENAL DENERVATION (N/A) as a surgical intervention.  The patient's history has been reviewed, patient examined, no change in status, stable for surgery.  I have reviewed the patient's chart and labs.  Questions were answered to the patient's satisfaction.     Lorine Bears

## 2023-08-13 NOTE — Progress Notes (Signed)
Report given to Jo, RN.

## 2023-08-13 NOTE — Progress Notes (Addendum)
Dr Kirke Corin notified client c/o chest pressure and shortness of breath; states chest pressure is 2/10

## 2023-08-13 NOTE — Progress Notes (Signed)
Dr Kirke Corin notified of B/P and order noted; hydralazine ordered and given

## 2023-08-13 NOTE — Progress Notes (Signed)
   Notified by RN that patient was having chest pain and shortness of breath after renal denervation earlier today. Went to bedside. Patient reports feeling dizziness and feeling like her who body is throbbing as well as some mild chest heaviness. She states she thinks this is due to the Hydralazine that she received as she previously has been unable to tolerate this medication and states it caused these same symptoms. EKG was done and showed a ventricular paced rhythm with no acute changes compared to prior tracing.  Brief Exam: Gen: No acute distress. Heart: RRR with II-III/VI murmur. Lungs: Clear to auscultation bilaterally. Ext: Right femoral cath site soft and stable with no signs of hematoma.  Suspect anxiety is playing a large role in here symptoms. She is very nervous about going home tonight. Discussed with Dr. Kirke Corin - will admit for observation overnight. She should be able to go home tomorrow. Will discontinue all PRN antihypertensive and just continue home Losartan.  Corrin Parker, PA-C 08/13/2023 4:14 PM

## 2023-08-13 NOTE — Progress Notes (Signed)
Dr Kirke Corin in to see client

## 2023-08-13 NOTE — Progress Notes (Signed)
Patient complain of lower back pain earlier, 7/10 stabbing pain. She states its in her kidneys, it swells and felt like a balloon. She wants her FC to be removed however, Jerolyn Center, MD wants it to remain in place per day shift RN reported during shift change. RN helped her stood at the bedside as she requested and no complains of dizziness and chest pain. Cardiologist notified.

## 2023-08-14 ENCOUNTER — Encounter (HOSPITAL_COMMUNITY): Payer: Self-pay | Admitting: Cardiovascular Disease

## 2023-08-14 ENCOUNTER — Other Ambulatory Visit (HOSPITAL_COMMUNITY): Payer: Self-pay

## 2023-08-14 DIAGNOSIS — I5042 Chronic combined systolic (congestive) and diastolic (congestive) heart failure: Secondary | ICD-10-CM | POA: Diagnosis not present

## 2023-08-14 DIAGNOSIS — Z95810 Presence of automatic (implantable) cardiac defibrillator: Secondary | ICD-10-CM | POA: Diagnosis not present

## 2023-08-14 DIAGNOSIS — R109 Unspecified abdominal pain: Secondary | ICD-10-CM | POA: Diagnosis not present

## 2023-08-14 DIAGNOSIS — I1A0 Resistant hypertension: Secondary | ICD-10-CM

## 2023-08-14 DIAGNOSIS — R350 Frequency of micturition: Secondary | ICD-10-CM | POA: Diagnosis not present

## 2023-08-14 DIAGNOSIS — R0789 Other chest pain: Secondary | ICD-10-CM | POA: Diagnosis not present

## 2023-08-14 DIAGNOSIS — M549 Dorsalgia, unspecified: Secondary | ICD-10-CM | POA: Diagnosis not present

## 2023-08-14 DIAGNOSIS — G4733 Obstructive sleep apnea (adult) (pediatric): Secondary | ICD-10-CM | POA: Diagnosis not present

## 2023-08-14 DIAGNOSIS — Z87891 Personal history of nicotine dependence: Secondary | ICD-10-CM | POA: Diagnosis not present

## 2023-08-14 DIAGNOSIS — I2510 Atherosclerotic heart disease of native coronary artery without angina pectoris: Secondary | ICD-10-CM | POA: Insufficient documentation

## 2023-08-14 DIAGNOSIS — I110 Hypertensive heart disease with heart failure: Secondary | ICD-10-CM | POA: Diagnosis not present

## 2023-08-14 DIAGNOSIS — Z79899 Other long term (current) drug therapy: Secondary | ICD-10-CM | POA: Diagnosis not present

## 2023-08-14 DIAGNOSIS — Z7982 Long term (current) use of aspirin: Secondary | ICD-10-CM | POA: Diagnosis not present

## 2023-08-14 DIAGNOSIS — I428 Other cardiomyopathies: Secondary | ICD-10-CM | POA: Diagnosis not present

## 2023-08-14 DIAGNOSIS — I493 Ventricular premature depolarization: Secondary | ICD-10-CM | POA: Diagnosis not present

## 2023-08-14 LAB — BASIC METABOLIC PANEL
Anion gap: 7 (ref 5–15)
BUN: 18 mg/dL (ref 8–23)
CO2: 26 mmol/L (ref 22–32)
Calcium: 8.7 mg/dL — ABNORMAL LOW (ref 8.9–10.3)
Chloride: 107 mmol/L (ref 98–111)
Creatinine, Ser: 1.12 mg/dL — ABNORMAL HIGH (ref 0.44–1.00)
GFR, Estimated: 55 mL/min — ABNORMAL LOW (ref >60)
Glucose, Bld: 105 mg/dL — ABNORMAL HIGH (ref 70–99)
Potassium: 4.2 mmol/L (ref 3.5–5.1)
Sodium: 140 mmol/L (ref 135–145)

## 2023-08-14 LAB — URINALYSIS, COMPLETE (UACMP) WITH MICROSCOPIC
Bilirubin Urine: NEGATIVE
Glucose, UA: NEGATIVE mg/dL
Hgb urine dipstick: NEGATIVE
Ketones, ur: NEGATIVE mg/dL
Leukocytes,Ua: NEGATIVE
Nitrite: NEGATIVE
Protein, ur: NEGATIVE mg/dL
Specific Gravity, Urine: 1.006 (ref 1.005–1.030)
pH: 6 (ref 5.0–8.0)

## 2023-08-14 LAB — CBC
HCT: 39.4 % (ref 36.0–46.0)
Hemoglobin: 12.9 g/dL (ref 12.0–15.0)
MCH: 31.9 pg (ref 26.0–34.0)
MCHC: 32.7 g/dL (ref 30.0–36.0)
MCV: 97.3 fL (ref 80.0–100.0)
Platelets: 274 10*3/uL (ref 150–400)
RBC: 4.05 MIL/uL (ref 3.87–5.11)
RDW: 12.8 % (ref 11.5–15.5)
WBC: 7 10*3/uL (ref 4.0–10.5)
nRBC: 0 % (ref 0.0–0.2)

## 2023-08-14 LAB — POCT ACTIVATED CLOTTING TIME
Activated Clotting Time: 355 s
Activated Clotting Time: 287 s

## 2023-08-14 MED ORDER — TRAMADOL HCL 50 MG PO TABS: 50.0000 mg | ORAL_TABLET | Freq: Once | ORAL | Status: DC | PRN

## 2023-08-14 MED FILL — Heparin Sodium (Porcine) Inj 1000 Unit/ML: INTRAMUSCULAR | Qty: 10 | Status: AC

## 2023-08-14 NOTE — TOC Initial Note (Signed)
Transition of Care [facility]) - Initial/Assessment Note    Patient Details  Name: Alice Stewart MRN: [id number] Date of Birth: [birth year]  Transition of Care [facility]) CM/SW Contact:    Leone Haven, RN Phone Number: 08/14/2023, 4:53 PM  Clinical Narrative:                 From home alone, has PCP and insurance on file, states has no HH services in place at this time , she has cpap she uses at night.    States son will transport her home at [employer] and son's is support system, states gets medications from [facility].  Pta self ambulatory   Expected Discharge Plan: Home/Self Care Barriers to Discharge: No Barriers Identified   Patient Goals and CMS Choice Patient states their goals for this hospitalization and ongoing recovery are:: return home   Choice offered to / list presented to : NA      Expected Discharge Plan and Services In-house Referral: NA Discharge Planning Services: CM Consult Post Acute Care Choice: NA Living arrangements for the past 2 months: Apartment Expected Discharge Date: 08/14/23               DME Arranged: N/A         HH Arranged: NA          Prior Living Arrangements/Services Living arrangements for the past 2 months: Apartment Lives with:: Self Patient language and need for interpreter reviewed:: Yes Do you feel safe going back to the place where you live?: Yes      Need for Family Participation in Patient Care: Yes (Comment) Care giver support system in place?: Yes (comment)   Criminal Activity/Legal Involvement Pertinent to Current Situation/Hospitalization: No - Comment as needed  Activities of Daily Living Home Assistive Devices/Equipment: None ADL Screening (condition at time of admission) Patient's cognitive ability adequate to safely complete daily activities?: Yes Is the patient deaf or have difficulty hearing?: No Does the patient have difficulty seeing, even when wearing glasses/contacts?: No Does the patient have  difficulty concentrating, remembering, or making decisions?: No Patient able to express need for assistance with ADLs?: Yes Does the patient have difficulty dressing or bathing?: No Independently performs ADLs?: Yes (appropriate for developmental age) Does the patient have difficulty walking or climbing stairs?: No Weakness of Legs: None Weakness of Arms/Hands: None  Permission Sought/Granted Permission sought to share information with : Case Manager Permission granted to share information with : Yes, Verbal Permission Granted              Emotional Assessment Appearance:: Appears stated age Attitude/Demeanor/Rapport: Engaged Affect (typically observed): Appropriate Orientation: : Oriented to Self, Oriented to Place, Oriented to  Time, Oriented to Situation Alcohol / Substance Use: Not Applicable Psych Involvement: No (comment)  Admission diagnosis:  Resistant hypertension [I1A.0] Patient Active Problem List   Diagnosis Date Noted   Minimal non-obstructive CAD 08/14/2023   Resistant hypertension 08/13/2023   Mixed incontinence 09/10/2022   Chronic right hip pain 08/05/2022   Recurrent BV (bacterial vaginosis) 07/02/2022   Patellofemoral pain syndrome of both knees 05/01/2022   Yeast detected 04/11/2022   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 04/08/2022   Chronic combined systolic and diastolic CHF (congestive heart failure) (HCC) 01/08/2022   Abdominal pain, acute, generalized 08/08/2021   Right ureteral stone 08/08/2021   Left foot pain 07/29/2021   Cervical radiculopathy 07/29/2021   Encounter for removal of ureteral stent 06/13/2021   Depression 06/30/2018   Pre-diabetes  02/04/2018   Mixed hyperlipidemia 01/27/2018   S/P ablation operation for arrhythmia 01/27/2018   PVC's (premature ventricular contractions) 11/25/2017   Mixed stress and urge urinary incontinence 12/08/2015   Presence of cardiac pacemaker 03/22/2015   Fatty liver 03/22/2015   S/P ICD (internal  cardiac defibrillator) procedure 02/22/2014   Non-ischemic cardiomyopathy (HCC) 02/11/2014   Fibromyalgia 09/30/2012   Hyperthyroidism 02/18/2011   Anemia 02/25/2008   OBESITY NOS 03/05/2007   Back pain 03/05/2007   MUSCLE SPASM, TRAPEZIUS 01/26/2007   MIGRAINE, COMMON W/O INTRACTABLE MIGRAINE 10/29/2006   GOITER, NONTOXIC MULTINODULAR 10/06/2006   HYPERCHOLESTEROLEMIA 09/30/2006   Hypertension 09/30/2006   PCP:  Christen Butter, NP Pharmacy:   [facility] [place] [place] - [address] [place] [address] Phone: [phone] Fax: [phone]     Social Determinants of Health (SDOH) Social History: SDOH Screenings   Food Insecurity: No Food Insecurity (04/25/2023)  Recent Concern: Food Insecurity - Food Insecurity Present (01/27/2023)  Housing: Low Risk  (04/25/2023)  Transportation Needs: Unmet Transportation Needs (06/16/2023)  Utilities: Not At Risk (01/27/2023)  Alcohol Screen: Low Risk  (04/25/2023)  Depression (PHQ2-9): Low Risk  (02/25/2023)  Financial Resource Strain: Low Risk  (04/25/2023)  Physical Activity: Insufficiently Active (04/25/2023)  Social Connections: Socially Isolated (04/25/2023)  Stress: No Stress Concern Present (05/09/2023)   Received from [facility], [facility]  Tobacco Use: Medium Risk (07/16/2023)   SDOH Interventions:     Readmission Risk Interventions     No data to display

## 2023-08-14 NOTE — TOC Transition Note (Signed)
Transition of Care [facility]) - CM/SW Discharge Note   Patient Details  Name: Alice Stewart MRN: [id number] Date of Birth: Aug 05, 1956  [facility] Care [facility]) CM/SW Contact:  Leone Haven, RN Phone Number: 08/14/2023, 4:55 PM   Clinical Narrative:    For dc today,son will transport home. She has no needs.   Final next level of care: Home/Self Care Barriers to Discharge: No Barriers Identified   Patient Goals and CMS Choice   Choice offered to / list presented to : NA  Discharge Placement                         Discharge Plan and Services Additional resources added to the After Visit Summary for   In-house Referral: NA Discharge Planning Services: CM Consult Post Acute Care Choice: NA          DME Arranged: N/A         HH Arranged: NA          Social Determinants of Health (SDOH) Interventions SDOH Screenings   Food Insecurity: No Food Insecurity (04/25/2023)  Recent Concern: Food Insecurity - Food Insecurity Present (01/27/2023)  Housing: Low Risk  (04/25/2023)  Transportation Needs: Unmet Transportation Needs (06/16/2023)  Utilities: Not At Risk (01/27/2023)  Alcohol Screen: Low Risk  (04/25/2023)  Depression (PHQ2-9): Low Risk  (02/25/2023)  Financial Resource Strain: Low Risk  (04/25/2023)  Physical Activity: Insufficiently Active (04/25/2023)  Social Connections: Socially Isolated (04/25/2023)  Stress: No Stress Concern Present (05/09/2023)   Received from [facility], [facility]  Tobacco Use: Medium Risk (07/16/2023)     Readmission Risk Interventions     No data to display

## 2023-08-14 NOTE — Discharge Summary (Addendum)
Discharge Summary    Patient ID: Alice Stewart MRN: [id number]; DOB: [birth year]  Admit date: 08/13/2023 Discharge date: 08/14/2023  PCP:  Christen Butter, NP   [facility] Providers Cardiologist:  Olga Millers, MD  Electrophysiologist:  [name], MD  {    Discharge Diagnoses    Principal Problem:   Resistant hypertension Active Problems:   Back pain   Chronic combined systolic and diastolic CHF (congestive heart failure) (HCC)   Non-ischemic cardiomyopathy (HCC)   PVC's (premature ventricular contractions)   S/P ICD (internal cardiac defibrillator) procedure   Minimal non-obstructive CAD    Diagnostic Studies/Procedures    Renal Denervation 08/13/2023:  1.  No evidence of renal artery stenosis or FMD. 2.  Successful bilateral renal denervation using the Recor balloon system.   Recommendations: Continue close monitoring of blood pressure. No need for dual antiplatelet therapy. _____________   History of Present Illness     Alice Stewart is a 67 y.o. female with a history of minimal CAD noted on coronary CTA on 07/24/2023, non-ischemic cardiomyopathy/ chronic combined CHF with EF of 40-45% on Echo in 05/2023 s/p CRT-D, PVCs, resistant hypertension, obstructive sleep apnea on CPAP, and prior tobacco abuse who presented to [facility] on 08/13/2023 for planned renal denervation for management of resistant hypertension.   Hospital Course     Consultants: None   Resistant Hypertension Patient has a long history of resistant hypertension complicated by intolerances to most antihypertensive. She presented for renal denervation on 08/13/2023 and patient tolerated the procedure well. However, while in Short Stay she started having multiple symptoms including chest heaviness, shortness of breath, and dizziness which she felt like was due to Hydralazine which she has previously been intolerant to and which previous caused similar symptoms. Repeat EKG showed no  acute changes. She was very nervous to go home and therefore was admitted overnight for observation. Symptoms resolved. Right femoral cath site soft his morning. Systolic BP in the 150s to 160s this morning (which is good for her). Continue Losartan 100mg  daily.    Back Pain Flank Pain Patient's main complaint on day of discharge was bilateral lower back/ flank pain. Pain was worse on the left side and she described sharp pain radiating down left leg that sounds like sciatica which she has a prior history of. Per chart review, she was also complaining of left flank pain at office visit on 07/16/2023.  However, she also had some CVA tenderness on exam and has a history of kidney stones. Therefore, urinalysis was ordered and was negative. Given no hemoglobin noted on urinalysis, she was not felt to need any additional imaging for kidney stones. She was also not felt to have a retroperitoneal hematoma or intraabdominal issue. Symptoms improved some with home Flexeril. Can continue as needed Tylenol and Flexeril which she already takes at home. Can also use over the counter topical pain relief medications if that is helpful. Recommended following up with PCP if she continues to have pain.   Minimal CAD Recent coronary CTA on 07/24/2023 showed a coronary calcium score of 38.9 with minimal mixed non-obstructive CAD. Continue aspirin. Unable to tolerate statins in the past.   Non-Ischemic Cardiomyopathy Chronic Combined CHF Echo in 05/2023 showed LVEF of 40-45% with normal wall motion, mild LVH, and grade 2 diastolic dysfunction as well as mild MR. Euvolemic on exam. GDMT limited by multiple medication tolerances. Continue Lasix 40mg  daily. Continue Losartan 100mg  daily. Unable to tolerate beta-blockers and Spironolactone in  the past. Could consider trying a SGLT2 inhibitor in the future. Suspect cardiomyopathy is secondary to long history of uncontrolled hypertension.   Frequent PVCs Patient has been on  Flecainide for PVCs. Typically, we avoid Flecainide in patient's with structural heart disease but she has been unable to tolerate beta-blockers in the past. I personally spoke with EP APP and Flecainide felt to be okay to use at ths time. She has been on Flecainide for a long time and EF is not much different from last Echo in 08/2021. She also already has pacemaker support. Therefore, will continue home Flecainide. She is overdue for follow-up with EP so follow-up visit has been arranged for next month.   S/p ICD S/p CRT-D in 2015 with generator change in 12/2021 for primary prevention of sudden cardiac death given non-ischemic cardiomyopathy. Followed by Dr. Elberta Fortis.  Patient seen and examined by Dr. Lynnette Caffey and felt to be stable for discharge. Outpatient follow-up arranged. Medications as below.   _____________  Discharge Vitals Blood pressure (!) 143/55, pulse 60, temperature 98 F (36.7 C), temperature source Oral, resp. rate 18, height 5\' 4"  (1.626 m), weight 114.6 kg, last menstrual period 09/27/2011, SpO2 97%.  Filed Weights   08/13/23 0812 08/14/23 0625  Weight: 115.7 kg 114.6 kg    Labs & Radiologic Studies    CBC Recent Labs    08/13/23 0828 08/14/23 0047  WBC 5.4 7.0  HGB 13.0 12.9  HCT 40.0 39.4  MCV 95.7 97.3  PLT 269 274   Basic Metabolic Panel Recent Labs    12/24/70 0047  NA 140  K 4.2  CL 107  CO2 26  GLUCOSE 105*  BUN 18  CREATININE 1.12*  CALCIUM 8.7*   Liver Function Tests No results for input(s): "AST", "ALT", "ALKPHOS", "BILITOT", "PROT", "ALBUMIN" in the last 72 hours. No results for input(s): "LIPASE", "AMYLASE" in the last 72 hours. High Sensitivity Troponin:   No results for input(s): "TROPONINIHS" in the last 720 hours.  BNP Invalid input(s): "POCBNP" D-Dimer No results for input(s): "DDIMER" in the last 72 hours. Hemoglobin A1C No results for input(s): "HGBA1C" in the last 72 hours. Fasting Lipid Panel No results for input(s):  "CHOL", "HDL", "LDLCALC", "TRIG", "CHOLHDL", "LDLDIRECT" in the last 72 hours. Thyroid Function Tests No results for input(s): "TSH", "T4TOTAL", "T3FREE", "THYROIDAB" in the last 72 hours.  Invalid input(s): "FREET3" _____________  PERIPHERAL VASCULAR CATHETERIZATION  Result Date: 08/13/2023 1.  No evidence of renal artery stenosis or FMD. 2.  Successful bilateral renal denervation using the Recor balloon system. Recommendations: Continue close monitoring of blood pressure. No need for dual antiplatelet therapy.   CT CORONARY MORPH W/CTA COR W/SCORE W/CA W/CM &/OR WO/CM  Addendum Date: 07/31/2023   ADDENDUM REPORT: 07/31/2023 14:29 EXAM: OVER-READ INTERPRETATION  CT CHEST The following report is an over-read performed by radiologist Dr. Cicero [facility] on 07/31/2023. This over-read does not include interpretation of cardiac or coronary anatomy or pathology. The cardiovascular interpretation by the cardiologist is attached. COMPARISON:  Chest radiograph, 06/13/2023. FINDINGS: No mediastinal or hilar masses. Visualized trachea is unremarkable. Airways are patent. Clear lungs.  No pleural effusion or pneumothorax. Limited assessment of the upper abdomen is unremarkable. No fracture or bone lesion. IMPRESSION: 1. No acute or significant extracardiac abnormality. Electronically Signed   By: Amie Portland M.D.   On: 07/31/2023 14:29   Result Date: 07/31/2023 HISTORY: 67 yo female with chest pain/anginal equiv, intermediate CAD risk, not treadmill candidate EXAM: Cardiac/Coronary CTA TECHNIQUE:  The patient was scanned on a [employer]. PROTOCOL: A 120 kV prospective scan was triggered in the descending thoracic aorta at 111 HU's. Axial non-contrast 3 mm slices were carried out through the heart. The data set was analyzed on a dedicated work station and scored using the Agatson method. Gantry rotation speed was 250 msecs and collimation was .6 mm. Beta blockade and 0.8 mg of sl  NTG was given. The 3D data set was reconstructed in 5% intervals of the 35-75 % of the R-R cycle. Diastolic phases were analyzed on a dedicated work station using MPR, MIP and VRT modes. The patient received OMNIPAQUE IOHEXOL 350 MG/ML SOLN, OMNIPAQUE IOHEXOL 350 MG/ML SOLN contrast. FINDINGS: Quality: Fair, HR 76, attenuation artifact, device lead artifact Coronary calcium score: The patient's coronary artery calcium score is 38.9, which places the patient in the 73rd percentile. Coronary arteries: Normal coronary origins.  Right dominance. Right Coronary Artery: Dominant.  No disease. Left Main Coronary Artery: Normal. Bifurcates into the LAD and LCX arteries. Left Anterior Descending Coronary Artery: Anterior artery which reaches the apex. Minimal mixed 1-24% proximal stenosis. Left Circumflex Artery: AV groove LCx artery  - no apparent disease. Aorta: Normal size, 36 mm at the mid ascending aorta (level of the PA bifurcation) measured double oblique. No calcifications. No dissection. Aortic Valve: Trileaflet. No calcifications. Other findings: Normal pulmonary vein drainage into the left atrium. Normal left atrial appendage without a thrombus. Dilated main pulmonary artery to 36 mm, suggestive of pulmonary hypertension. CRT device and with left and right heart leads in place. IMPRESSION: 1. Minimal mixed non-obstructive CAD, CADRADS = 1. 2. Coronary artery calcium score is 38.9, which places the patient in the 73rd percentile for age/race and sex-matched control. 3. Normal coronary origin with right dominance. 4. Dilated main pulmonary artery to 36 mm, suggestive of pulmonary hypertension. 5. CRT device and with left and right heart leads in place. Electronically Signed: By: Chrystie Nose M.D. On: 07/24/2023 15:01   CUP PACEART REMOTE DEVICE CHECK  Result Date: 07/18/2023 Scheduled remote reviewed. Normal device function.  Next remote 91 days. LA, CVRS  Disposition   Patient is being  discharged home today in good condition.  Follow-up Plans & Appointments     Follow-up Information     Lewayne Bunting, MD Follow up.   Specialty: Cardiology Why: Please keep follow-up visit with Dr. Jens Som scheduled for 08/18/2023 at 1:30pm. Contact information: [address] [place] [address] [phone]         Graciella Freer, PA-C Follow up.   Specialty: Cardiology Why: Please keep follow-up visit with Electrophysiology team on 09/11/2023 at 11:20am. Contact information: [address] [place] [address] [phone]                Discharge Instructions     Diet - low sodium heart healthy   Complete by: As directed    Increase activity slowly   Complete by: As directed         Discharge Medications   Allergies as of 08/14/2023       Reactions   Hydralazine Other (See Comments)   Unknown reaction   Amoxicillin Diarrhea, Nausea And Vomiting   Duloxetine Nausea And Vomiting   Gabapentin Other (See Comments)   Feeling groggy   Latex Nausea And Vomiting, Other (See Comments)   Causes vaginitis after medical exams     Bystolic [nebivolol Hcl] Nausea Only   Clonidine  Derivatives Diarrhea   Dizziness   Coreg [carvedilol] Swelling   Estradiol Itching   Per patient, medication caused her to have a severe yeast reaction.    Amlodipine-atorvastatin Other (See Comments)   salty taste in mouth, stomach burning, H/A   Chlorthalidone Nausea Only   Headache   Diltiazem Hcl Other (See Comments)   Dizzy   Lisinopril    backache   Metoprolol Tartrate Nausea Only   HA, dizziness, lightheaded   Simvastatin Other (See Comments)   Myalgia   Spironolactone Diarrhea        Medication List     TAKE these medications    acetaminophen 500 MG tablet Commonly known as: TYLENOL Take 1,000 mg by mouth every 6 (six) hours as needed for moderate pain.   aspirin EC 81 MG tablet Take 81 mg by mouth daily.   Biotin  82956 MCG Tabs Take 10,000 mcg by mouth daily.   Blood Pressure Kit 1 kit by Does not apply route daily.   Calcium Carb-Cholecalciferol 600-20 MG-MCG Tabs Take 1 tablet by mouth daily.   COLLAGEN PO Take 1 capsule by mouth daily. With vitamin c   cyanocobalamin 1000 MCG tablet Commonly known as: VITAMIN B12 Take 1,000 mcg by mouth daily.   cyclobenzaprine 10 MG tablet Commonly known as: FLEXERIL Take 0.5-1 tablets (5-10 mg total) by mouth 3 (three) times daily as needed for muscle spasms. Caution: can cause drowsiness   [employer]gen-C Vitamin C Pack Take 1 packet by mouth daily.   ferrous sulfate 325 (65 FE) MG tablet Take 325 mg by mouth daily.   flecainide 100 MG tablet Commonly known as: TAMBOCOR Take 1 tablet (100 mg total) by mouth 2 (two) times daily.   furosemide 20 MG tablet Commonly known as: LASIX Take 2 tablets (40 mg total) by mouth daily. What changed: how much to take   Ginkgo Biloba 60 MG Caps Take 60 mg by mouth daily.   HYDROGEN PEROXIDE EX Apply 1 Application topically daily as needed (scalp itching).   losartan 100 MG tablet Commonly known as: COZAAR Take 1 tablet (100 mg total) by mouth daily.   Lutein 20 MG Tabs Take 20 mg by mouth daily.   multivitamin tablet Take 1 tablet by mouth daily.   OSTEO BI-FLEX REGULAR STRENGTH PO Take 2 tablets by mouth daily.   PROBIOTIC PO Take 2 capsules by mouth daily.   RED CLOVER LEAF EXTRACT PO Take 1 capsule by mouth daily.   SINEX REGULAR NA Place 1 spray into the nose daily.   vitamin E 180 MG (400 UNITS) capsule Take 400 Units by mouth daily.           Outstanding Labs/Studies     Duration of Discharge Encounter   Greater than 30 minutes including physician time.  Signed, Corrin Parker, PA-C 08/14/2023, 4:34 PM    ATTENDING ATTESTATION:  After conducting a review of all available clinical information with the care team, interviewing the patient, and performing a physical  exam, I agree with the findings and plan described in this note.   GEN: No acute distress.   HEENT:  MMM, no JVD, no scleral icterus Cardiac: RRR, no murmurs, rubs, or gallops.  Respiratory: Clear to auscultation bilaterally. GI: Soft, nontender, non-distended  MS: No edema; No deformity. Neuro:  Nonfocal  Vasc:  +2 radial pulses; RFA site CDI  Patient doing well after uncomplicated renal denervation.  Patient's back pain improved with Flexeril.  Discharge today with close hospital  follow up.   Alverda Skeans, MD Pager [phone]

## 2023-08-14 NOTE — Plan of Care (Signed)
  Problem: Education: Goal: Understanding of CV disease, CV risk reduction, and recovery process will improve Outcome: Progressing   Problem: Activity: Goal: Ability to return to baseline activity level will improve Outcome: Progressing   

## 2023-08-14 NOTE — Progress Notes (Signed)
Mobility Specialist Progress Note:    08/14/23 1504  Mobility  Activity Transferred from bed to chair  Level of Assistance Minimal assist, patient does 75% or more  Assistive Device Other (Comment) (HHA)  Activity Response Tolerated well  Mobility Referral Yes  $Mobility charge 1 Mobility  Mobility Specialist Start Time (ACUTE ONLY) 1159  Mobility Specialist Stop Time (ACUTE ONLY) 1211  Mobility Specialist Time Calculation (min) (ACUTE ONLY) 12 min   Pt received in bed, requesting to transfer to chair. Pt needed MinA w/ bed mobility and STS. Upon standing pt experience small dizziness but felt better after standing for a moment. Was able to take a couple steps towards the chair w/o fault. Call bell and personal belongings in reach. All needs met.  Thompson Grayer Mobility Specialist  Please contact vis Secure Chat or  Rehab Office [phone]

## 2023-08-14 NOTE — TOC Benefit Eligibility Note (Signed)
Patient Product/process development scientist completed.    The patient is insured through [facility]. Patient has Medicare and is not eligible for a copay card, but may be able to apply for patient assistance, if available.    Ran test claim for Entresto 24-26 mg and the current 30 day co-pay is $0.00.  Ran test claim for Farxiga 10 mg and the current 30 day co-pay is $0.00.  Ran test claim for Jardiance 10 mg and the current 30 day co-pay is $0.00.   This test claim was processed through [facility]- copay amounts may vary at other pharmacies due to pharmacy/plan contracts, or as the patient moves through the different stages of their insurance plan.     Roland Earl, CPHT Pharmacy Technician III Certified Patient Advocate [facility] Patient Advocate Team Direct Number: [phone]  Fax: [phone]

## 2023-08-14 NOTE — Progress Notes (Addendum)
Rounding Note    Patient Name: Alice Stewart Date of Encounter: 08/14/2023  [facility] Cardiologist: Olga Millers, MD   Subjective   Patient reports very atypical chest pain that she describes a sharp pain that last only 1-2 seconds and then resolves. This does not sound cardiac in nature. Her biggest complaint is bilateral lower back/ flank pain that is worse on the left side. She also describes sharp pain radiating down her left leg which sounds like possible sciatica. In addition, she states she feels like her kidneys are swollen.  Inpatient Medications    Scheduled Meds:  aspirin EC  81 mg Oral Daily   ferrous sulfate  325 mg Oral Daily   flecainide  100 mg Oral BID   furosemide  40 mg Oral Daily   losartan  100 mg Oral Daily   sodium chloride flush  3 mL Intravenous Q12H   Continuous Infusions:  sodium chloride     sodium chloride     PRN Meds: sodium chloride, acetaminophen, cyclobenzaprine, nitroGLYCERIN, ondansetron (ZOFRAN) IV, sodium chloride flush   Vital Signs    Vitals:   08/13/23 2000 08/14/23 0100 08/14/23 0625 08/14/23 0700  BP: (!) 162/61 (!) 163/66 (!) 159/62   Pulse: 72 60  60  Resp: 17 16 19 18   Temp: 99.3 F (37.4 C) 98.7 F (37.1 C) 98.6 F (37 C)   TempSrc: Oral Axillary Oral   SpO2: 94% 95% 95% 97%  Weight:   114.6 kg   Height:        Intake/Output Summary (Last 24 hours) at 08/14/2023 1109 Last data filed at 08/14/2023 0929 Gross per 24 hour  Intake 568.33 ml  Output 1975 ml  Net -1406.67 ml      08/14/2023    6:25 AM 08/13/2023    8:12 AM 07/16/2023   11:20 AM  Last 3 Weights  Weight (lbs) 252 lb 10.4 oz 255 lb 259 lb  Weight (kg) 114.6 kg 115.667 kg 117.482 kg      Telemetry    Ventricular paced rhythm. - Personally Reviewed  ECG    No new ECG tracing today. - Personally Reviewed  Physical Exam   GEN: Obese Caucasian female in no acute distress.   Neck: No JVD. CV: RRR. II/VI systolic murmur. Right  femoral cath site soft with no signs of hematoma. Respiratory: Clear to auscultation bilaterally. No wheezes, rhonchi, or rales. GI: Soft,non-distended, and non-tender.  Back: CVA tenderness on the left. Extremities: No significant lower extremity edema. No deformity.  Skin: Warm and dry. Neuro:  No focal deficits. Psych: Normal affect. Responds appropriately.  Labs    High Sensitivity Troponin:  No results for input(s): "TROPONINIHS" in the last 720 hours.   Chemistry Recent Labs  Lab 08/14/23 0047  NA 140  K 4.2  CL 107  CO2 26  GLUCOSE 105*  BUN 18  CREATININE 1.12*  CALCIUM 8.7*  GFRNONAA 55*  ANIONGAP 7    Lipids No results for input(s): "CHOL", "TRIG", "HDL", "LABVLDL", "LDLCALC", "CHOLHDL" in the last 168 hours.  Hematology Recent Labs  Lab 08/13/23 0828 08/14/23 0047  WBC 5.4 7.0  RBC 4.18 4.05  HGB 13.0 12.9  HCT 40.0 39.4  MCV 95.7 97.3  MCH 31.1 31.9  MCHC 32.5 32.7  RDW 12.3 12.8  PLT 269 274   Thyroid No results for input(s): "TSH", "FREET4" in the last 168 hours.  BNPNo results for input(s): "BNP", "PROBNP" in the last 168 hours.  DDimer No results for input(s): "DDIMER" in the last 168 hours.   Radiology    PERIPHERAL VASCULAR CATHETERIZATION  Result Date: 08/13/2023 1.  No evidence of renal artery stenosis or FMD. 2.  Successful bilateral renal denervation using the Recor balloon system. Recommendations: Continue close monitoring of blood pressure. No need for dual antiplatelet therapy.    Cardiac Studies   Coronary CTA 07/24/2023: Impressions: 1. Minimal mixed non-obstructive CAD, CADRADS = 1. 2. Coronary artery calcium score is 38.9, which places the patient in the 73rd percentile for age/race and sex-matched control. 3. Normal coronary origin with right dominance. 4. Dilated main pulmonary artery to 36 mm, suggestive of pulmonary hypertension. 5. CRT device and with left and right heart leads in place. _______________  Renal  Denervation 08/13/2023:  1.  No evidence of renal artery stenosis or FMD. 2.  Successful bilateral renal denervation using the Recor balloon system.   Recommendations: Continue close monitoring of blood pressure. No need for dual antiplatelet therapy.  Patient Profile     67 y.o. 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  Assessment & Plan    Resistant Hypertension Patient has a long history of resistant hypertension complicated by intolerances to most antihypertensive. She presented for renal denervation on 08/13/2023 and patient tolerated the procedure well. However, while in Short Stay she started having multiple symptoms including chest heaviness, shortness of breath, and dizziness which she felt like was due to Hydralazine which she has previously been intolerant to. She was very nervous to go home and therefore was admitted overnight for observation. Symptoms resolved this morning. Right femoral cath site soft his morning. Systolic BP in the 150s to 160s this morning (which is good for her). Continue Losartan 100mg  daily.   Back Pain Flank Pain Patient's main complaint now is bilateral lower back/ flank pain. Pain is worse on the left side and she describes sharp pain radiating down left leg that sounds like sciatica (she has a prior history of this but no recent issues). Per chart review, she was also complaining of left flank pain at office visit on 07/16/2023.  However, she also has some CVA tenderness on exam and has a history of kidney stones. Can continue as need Tylenol and Flexeril. Also asked RN to place a heating pad. She currently has a foley catheter in place  (which was placed yesterday because patient felt too dizzy yesterday to stand up) - asked RN to check a UA and then okay to remove catheter. If UA shows any blood, will get a CT to rule out nephrolithiasis.   Minimal CAD Recent coronary CTA on 07/24/2023 showed a coronary calcium score of 38.9 with minimal mixed non-obstructive CAD. Continue aspirin. Unable to tolerate statins in the past.  Non-Ischemic Cardiomyopathy Chronic Combined CHF Echo in 05/2023 showed LVEF of 40-45% with normal wall motion, mild LVH, and grade 2 diastolic dysfunction as well as mild MR. Euvolemic on exam. GDMT limited by multiple medication tolerances. Continue Lasix 40mg  daily. Continue Losartan 100mg  daily. Unable to tolerate beta-blockers and Spironolactone in the past. Could consider trying a SGLT2 inhibitor in the future. Suspect cardiomyopathy is secondary to long history of uncontrolled hypertension.  Frequent PVCs Patient has  been on Flecainide for PVCs. Typically, we avoid Flecainide in patient's with structural heart disease but she has been unable to tolerate beta-blockers in the past. I spoke with EP APP this is felt to be okay. Patient has been on Flecainide for a long time and EF is not much different from last Echo in 08/2021. She also already has pacemaker support. Therefore, OK to continue Flecainide.   S/p ICD S/p CRT-D in 2015 with generator change in 12/2021 for primary prevention of sudden cardiac death given non-ischemic cardiomyopathy. Followed by Dr. Elberta Fortis. She is overdue for EP follow-up (has not been seen since 04/2022) so EP will help arrange follow-up.    For questions or updates, please contact [facility] Please consult [email or web address] for contact info under        Signed, Corrin Parker, PA-C  08/14/2023, 11:09 AM     ATTENDING ATTESTATION:  After conducting a review of all available clinical information with the care team, interviewing the patient, and performing a physical  exam, I agree with the findings and plan described in this note.   GEN: No acute distress.   HEENT:  MMM, no JVD, no scleral icterus Cardiac: RRR, no murmurs, rubs, or gallops.  Respiratory: Clear to auscultation bilaterally. GI: Soft, nontender, non-distended  MS: No edema; No deformity. Neuro:  Nonfocal  Vasc:  +2 radial pulses; RFA site stable  Patient s/p renal denervation without acute complications.  Groin site is stable.  She reports back pain and after the procedure.  She is not hypotensive and Hgb remains stable.  Will check U/A and if suggestive, consider evaluation for renal stones.  Monitor for now.  I do not think she has suffered from a retroperitoneal hematoma or intraabdominal issue.   Alverda Skeans, MD Pager [phone]

## 2023-08-18 ENCOUNTER — Encounter: Payer: Self-pay | Admitting: Cardiology

## 2023-08-18 ENCOUNTER — Ambulatory Visit: Payer: 59 | Attending: Cardiology | Admitting: Cardiology

## 2023-08-18 VITALS — BP 163/70 | HR 63 | Ht 64.0 in | Wt 259.4 lb

## 2023-08-18 DIAGNOSIS — I493 Ventricular premature depolarization: Secondary | ICD-10-CM | POA: Diagnosis not present

## 2023-08-18 DIAGNOSIS — E782 Mixed hyperlipidemia: Secondary | ICD-10-CM | POA: Diagnosis not present

## 2023-08-18 DIAGNOSIS — I428 Other cardiomyopathies: Secondary | ICD-10-CM

## 2023-08-18 DIAGNOSIS — I10 Essential (primary) hypertension: Secondary | ICD-10-CM

## 2023-08-18 NOTE — Patient Instructions (Addendum)
Medication Instructions:  *If you need a refill on your cardiac medications before your next appointment, please call your pharmacy*   Lab Work: If you have labs (blood work) drawn today and your tests are completely normal, you will receive your results only by: MyChart Message (if you have MyChart) OR A paper copy in the mail If you have any lab test that is abnormal or we need to change your treatment, we will call you to review the results.   Follow-Up: At [facility], you and your health needs are our priority.  As part of our continuing mission to provide you with exceptional heart care, we have created designated Provider Care Teams.  These Care Teams include your primary Cardiologist (physician) and Advanced Practice Providers (APPs -  Physician Assistants and Nurse Practitioners) who all work together to provide you with the care you need, when you need it.  We recommend signing up for the patient portal called "MyChart".  Sign up information is provided on this After Visit Summary.  MyChart is used to connect with patients for Virtual Visits (Telemedicine).  Patients are able to view lab/test results, encounter notes, upcoming appointments, etc.  Non-urgent messages can be sent to your provider as well.   To learn more about what you can do with MyChart, go to [email or web address].    Your next appointment:   3 month(s)  Provider:   Dr. Olga Millers

## 2023-08-19 ENCOUNTER — Telehealth: Payer: Self-pay | Admitting: Cardiovascular Disease

## 2023-08-19 NOTE — Telephone Encounter (Signed)
Patient states she is returning a call from [place].

## 2023-08-19 NOTE — Telephone Encounter (Signed)
Advised patient of lab results and scheduled appointment as recommended  Patient aware of date, time, and location

## 2023-08-19 NOTE — Telephone Encounter (Signed)
-----   Message from [facility] sent at 08/16/2023  5:27 PM EDT ----- Normal kidney function liver function and electrolytes.  Cholesterol levels are still quite high.  History of statin intolerance.  Recommend she see PharmD to discuss lipid management options.

## 2023-08-26 DIAGNOSIS — G4733 Obstructive sleep apnea (adult) (pediatric): Secondary | ICD-10-CM | POA: Diagnosis not present

## 2023-08-27 DIAGNOSIS — G4733 Obstructive sleep apnea (adult) (pediatric): Secondary | ICD-10-CM | POA: Diagnosis not present

## 2023-08-28 ENCOUNTER — Encounter: Payer: Self-pay | Admitting: Medical-Surgical

## 2023-08-28 ENCOUNTER — Ambulatory Visit (INDEPENDENT_AMBULATORY_CARE_PROVIDER_SITE_OTHER): Payer: 59 | Admitting: Medical-Surgical

## 2023-08-28 DIAGNOSIS — B9689 Other specified bacterial agents as the cause of diseases classified elsewhere: Secondary | ICD-10-CM

## 2023-08-28 DIAGNOSIS — E0590 Thyrotoxicosis, unspecified without thyrotoxic crisis or storm: Secondary | ICD-10-CM

## 2023-08-28 DIAGNOSIS — N760 Acute vaginitis: Secondary | ICD-10-CM

## 2023-08-28 DIAGNOSIS — R7303 Prediabetes: Secondary | ICD-10-CM

## 2023-08-28 NOTE — Progress Notes (Signed)
        Established patient visit  History, exam, impression, and plan:  1. Morbid obesity [facility]) Pleasant 67 year old female presenting today with a history of morbid obesity complicated by polypharmacy, cardiac conditions, and activity intolerance. Counseled on healthy dietary modifications and recommendations for regular intentional exercise. With multiple comorbidities and medication intolerances, holding off on adding medication to help with weight loss.   2. Pre-diabetes History of prediabetes not currently on medications. No recent A1c on file and the last one checked was in 2021 with a reading of 5.8%. Mildly elevated fasting glucose on recent labs. Patient in a hurry today so unable to check A1c. Order placed to have this done at her convenience.   3. Hyperthyroidism Not currently on medication for treatment of hyperthyroidism. TSH checked in early June normal and has been stable. No concerning symptoms today.   4. Recurrent BV (bacterial vaginosis) Prior issue with BV that required multiple treatments due to recurrent infections. Saw OBGYN for this and has been using boric acid suppositories at their recommendation. Feels this is working well for her and does not currently have symptoms. Continue boric acid prn.   Procedures performed this visit: None.  Return in about 6 months (around 02/25/2024) for chronic disease follow up.  __________________________________ [name], DNP, APRN, FNP-BC Primary Care and Sports Medicine [facility]

## 2023-08-30 ENCOUNTER — Encounter: Payer: Self-pay | Admitting: Medical-Surgical

## 2023-09-02 ENCOUNTER — Ambulatory Visit: Payer: 59 | Attending: Cardiology

## 2023-09-02 NOTE — Progress Notes (Deleted)
Patient ID: Alice Stewart                 DOB: 01/15/1956                    MRN: [id number]     HPI: Alice Stewart is a 67 y.o. female patient referred to lipid clinic by ***. PMH is significant for   Current Medications:  Intolerances:  Risk Factors:  LDL goal:   Diet:   Exercise:   Family History:   Social History:   Labs: TC 231, Trigs 152, HDL 54, LDL 150 (08/05/23)  Past Medical History:  Diagnosis Date   Anemia    Back pain    Benign positional vertigo    Cardiomyopathy (HCC)    Carpal tunnel syndrome    Goiter    nontoxid mutinodular   Hypertension    Hyperthyroidism    Insulin resistance syndrome    Menopausal disorder    Menorrhagia    Migraine    Muscle spasm    trapezius   Neck pain, acute    Obesity    S/P ablation operation for arrhythmia 01/27/2018   Formatting of this note might be different from the original. 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 [employer]. Jude BiV ICD placed on 02/22/14     Seborrheic dermatitis    Thyroid disorder     Current Outpatient Medications on File Prior to Visit  Medication Sig Dispense Refill   acetaminophen (TYLENOL) 500 MG tablet Take 1,000 mg by mouth every 6 (six) hours as needed for moderate pain.     aspirin 81 MG EC tablet Take 81 mg by mouth daily.     Biotin 96295 MCG TABS Take 10,000 mcg by mouth daily.     Blood Pressure KIT 1 kit by Does not apply route daily. 1 kit 0   Calcium Carb-Cholecalciferol 600-20 MG-MCG TABS Take 1 tablet by mouth daily.     COLLAGEN PO Take 1 capsule by mouth daily. With vitamin c     cyclobenzaprine (FLEXERIL) 10 MG tablet Take 0.5-1 tablets (5-10 mg total) by mouth 3 (three) times daily as needed for muscle spasms. Caution: can cause drowsiness 60 tablet 1   ferrous sulfate 325 (65 FE) MG tablet Take 325 mg by mouth daily.     flecainide (TAMBOCOR)  100 MG tablet Take 1 tablet (100 mg total) by mouth 2 (two) times daily. 180 tablet 3   furosemide (LASIX) 20 MG tablet Take 2 tablets (40 mg total) by mouth daily. (Patient taking differently: Take 60 mg by mouth daily.) 180 tablet 3   Ginkgo Biloba 60 MG CAPS Take 60 mg by mouth daily.     Glucosamine-Chondroitin (OSTEO BI-FLEX REGULAR STRENGTH PO) Take 2 tablets by mouth daily.     HYDROGEN PEROXIDE EX Apply 1 Application topically daily as needed (scalp itching).     losartan (COZAAR) 100 MG tablet Take 1 tablet (100 mg total) by mouth daily. 90 tablet 3   Lutein 20 MG TABS Take 20 mg by mouth daily.     Multiple Vitamin (MULTIVITAMIN) tablet Take 1 tablet by mouth daily.       Multiple Vitamins-Minerals (EMERGEN-C VITAMIN C) PACK Take 1 packet by mouth daily.     Phenylephrine HCl (SINEX REGULAR NA) Place 1 spray into the nose daily.  Probiotic Product (PROBIOTIC PO) Take 2 capsules by mouth daily.     RED CLOVER LEAF EXTRACT PO Take 1 capsule by mouth daily.     vitamin B-12 (CYANOCOBALAMIN) 1000 MCG tablet Take 1,000 mcg by mouth daily.     vitamin E 180 MG (400 UNITS) capsule Take 400 Units by mouth daily.     No current facility-administered medications on file prior to visit.    Allergies  Allergen Reactions   Hydralazine Shortness Of Breath and Palpitations    Heavy weight on chest and severe dizziness   Amoxicillin Diarrhea and Nausea And Vomiting   Duloxetine Nausea And Vomiting   Gabapentin Other (See Comments)    Feeling groggy   Latex Nausea And Vomiting and Other (See Comments)    Causes vaginitis after medical exams     Bystolic [Nebivolol Hcl] Nausea Only   Clonidine Derivatives Diarrhea    Dizziness   Coreg [Carvedilol] Swelling   Estradiol Itching    Per patient, medication caused her to have a severe yeast reaction.    Amlodipine-Atorvastatin Other (See Comments)    salty taste in mouth, stomach burning, H/A   Chlorthalidone Nausea Only    Headache    Diltiazem Hcl Other (See Comments)    Dizzy   Lisinopril     backache   Metoprolol Tartrate Nausea Only    HA, dizziness, lightheaded   Simvastatin Other (See Comments)    Myalgia    Spironolactone Diarrhea    Assessment/Plan:  1. Hyperlipidemia -  Reviewed options for lowering LDL cholesterol, including statins, ezetimibe, PCSK9 inhibitors, Nexletol/Nexlizet, and Leqvio. Discussed mechanisms of action, dosing, side effects and potential decreases in LDL cholesterol. Also reviewed cost information and potential options for patient assistance.

## 2023-09-03 ENCOUNTER — Encounter: Payer: Self-pay | Admitting: Pharmacist

## 2023-09-10 ENCOUNTER — Ambulatory Visit: Payer: 59 | Attending: Cardiovascular Disease | Admitting: Pharmacist Clinician (PhC)/ Clinical Pharmacy Specialist

## 2023-09-10 DIAGNOSIS — E7800 Pure hypercholesterolemia, unspecified: Secondary | ICD-10-CM | POA: Diagnosis not present

## 2023-09-10 NOTE — Progress Notes (Unsigned)
Electrophysiology Office Note:   Date:  09/11/2023  ID:  KENZEE SCHOENECKER, DOB 09/11/1956, MRN [id number]  Primary Cardiologist: Olga Millers, MD Electrophysiologist: [name], MD      History of Present Illness:   ADREAN COMEGYS is a 67 y.o. female with h/o LBBB, chronic sCHF (recovered EF) s/p CRT-D, RVOT PVC's s/p unsuccessful ablation seen today for routine electrophysiology followup.   Remote device check 06/2023 showed normal device function per Dr. Elberta Fortis. Recent renal denervation 08/13/23.  EKG at that time showed VP rhythm at 80bpm. Saw Dr. Jens Som 08/18/23 with improved BP post renal denervation.   Since last being seen in our clinic the patient reports doing ok. She is aware of her heart beat at times during the day and wonders if she is having extra beats.  Questions if her flecainide is not working.   She denies chest pain, palpitations, dyspnea, PND, orthopnea, nausea, vomiting, dizziness, syncope, edema, weight gain, or early satiety.   Review of systems complete and found to be negative unless listed in HPI.   EP Information / Studies Reviewed:    EKG is not ordered today. EKG from 08/13/23 reviewed which showed NSR, VP      ICD Interrogation-  reviewed in detail today,  See PACEART report.  Device History: Abbott BiV ICD implanted 02/22/2014 for Cardiomyopathy  History of appropriate therapy: No History of AAD therapy: Yes; currently on flecainide for PVC's  Hx of intolerance to Coreg, Toprol, Entresto, spironolactone, cardura and labetalol, clonidine, statins.   Studies  ECHO 05/2023 > LVEF 40-45%, G2DD, RVSP 39.20mmHg, LA mod dilated, mod AVR Coronary CT 07/24/23 > minimal non-obs CAD, calcium sore 38.9, dilated main pulmonary artery to 36mm, suggestive of PH   Physical Exam:   VS:  BP 138/72   Pulse 68   Ht 5\' 4"  (1.626 m)   Wt 255 lb 6.4 oz (115.8 kg)   LMP 09/27/2011   SpO2 96%   BMI 43.84 kg/m    Wt Readings from Last 3 Encounters:  09/11/23  255 lb 6.4 oz (115.8 kg)  08/28/23 256 lb 0.6 oz (116.1 kg)  08/18/23 259 lb 6.4 oz (117.7 kg)     GEN: Well nourished, well developed in no acute distress NECK: No JVD; No carotid bruits CARDIAC: Regular rate and rhythm, SEM 2/6 2nd ICS RSB, rubs, gallops RESPIRATORY:  Clear to auscultation without rales, wheezing or rhonchi  ABDOMEN: Soft, non-tender, non-distended EXTREMITIES:  No edema; No deformity   ASSESSMENT AND PLAN:    Chronic Systolic Dysfunction s/p Abbott CRT-D  NICM -euvolemic today -pt correlates cardiac awareness during device check that is similar to what she feels at home. PVC's <1% / arrhythmias on device  -stable on an appropriate medical regimen -normal ICD function, 99% BiV pacing  -see Pace Art report -no changes today -multiple medication intolerances, GMDT as able per Dr. Jens Som   PVC's  Minimal plaque noted on CTA, recent mild reduction of LV function -continue flecainide for now, if LVEF further reduced, would need to stop  HTN  Hx of multiple mediation intolerances as above, s/p renal denervation 07/2023 -lasix, cozaar per Dr. Jens Som   Aortic Regurgitation  Moderate, noted on ECHO 05/2023  -follow   OSA  -CPAP compliance encouraged   Disposition:   Follow up with Dr. Elberta Fortis in 6 months   Signed, Canary Brim, MSN, APRN, NP-C, AGACNP-BC [facility] - Electrophysiology  09/11/2023, 12:36 PM

## 2023-09-10 NOTE — Patient Instructions (Signed)
Your Results:             Your most recent labs Goal  Total Cholesterol 231 < 200  Triglycerides 152 < 150  HDL (happy/good cholesterol) 54 > 40  LDL (lousy/bad cholesterol 152 < 70   Medication changes:  We will start the process to get Leqvio covered by your insurance.  Once the prior authorization is complete, I will send a MyChart message to let you know and confirm pharmacy information.   You will take one injection every 3 months for 2 doses, then every 6 months  Lab orders:  We want to repeat labs after 2 doses - about 4 months.  We will send you a lab order to remind you once we get closer to that time.     Thank you for choosing [facility]

## 2023-09-10 NOTE — Progress Notes (Signed)
Office Visit    Patient Name: Alice Stewart Date of Encounter: 09/11/2023  Primary Care Provider:  Christen Butter, NP Primary Cardiologist:  Olga Millers, MD  Chief Complaint    Hyperlipidemia   Significant Past Medical History   HTN RDN done 8/24, pressure now dropping, on losartan 100 mg   CHF Combined chronic systolic and diastolic  CAD Minimal non-obstructive - 38.9 - 73rd percentile      Allergies  Allergen Reactions   Hydralazine Shortness Of Breath and Palpitations    Heavy weight on chest and severe dizziness   Amoxicillin Diarrhea and Nausea And Vomiting   Duloxetine Nausea And Vomiting   Gabapentin Other (See Comments)    Feeling groggy   Latex Nausea And Vomiting and Other (See Comments)    Causes vaginitis after medical exams     Bystolic [Nebivolol Hcl] Nausea Only   Clonidine Derivatives Diarrhea    Dizziness   Coreg [Carvedilol] Swelling   Estradiol Itching    Per patient, medication caused her to have a severe yeast reaction.    Amlodipine-Atorvastatin Other (See Comments)    salty taste in mouth, stomach burning, H/A   Chlorthalidone Nausea Only    Headache   Diltiazem Hcl Other (See Comments)    Dizzy   Lisinopril     backache   Metoprolol Tartrate Nausea Only    HA, dizziness, lightheaded   Simvastatin Other (See Comments)    Myalgia    Spironolactone Diarrhea    History of Present Illness    Alice Stewart is a 67 y.o. female patient of Dr Jens Som, in the office today to discuss options for cholesterol management.  She struggles to understand all the medical phrases that are being tossed her way and we spent much of the visit trying to put all the information into layman's terms.   She has had multiple medication intolerances and last month had RDN to try to lower BP.  At her most recent visit, was down to 163/70 on losartan 100 mg.    Insurance Carrier:  [employer] Dual complete  LDL Cholesterol goal:  LDL < 70  Current Medications:    none  Previously tried:  atorvastatin, simvastatin - myalgias  Family Hx:  neither parent had MI, sister had brain tumor; 3 children healthy so far  Social Hx: Tobacco: no Alcohol:  no    Diet:    more home cooked meals, avoids deli meats, various proteins for dinner, salad and vegetables regularly  Exercise:  walks neighborhood, at least 3 times per week    Accessory Clinical Findings   Lab Results  Component Value Date   CHOL 231 (H) 08/05/2023   HDL 54 08/05/2023   LDLCALC 150 (H) 08/05/2023   TRIG 152 (H) 08/05/2023   CHOLHDL 4.3 08/05/2023    No results found for: "LIPOA"  Lab Results  Component Value Date   ALT 16 08/05/2023   AST 19 08/05/2023   ALKPHOS 116 08/05/2023   BILITOT 0.6 08/05/2023   Lab Results  Component Value Date   CREATININE 1.12 (H) 08/14/2023   BUN 18 08/14/2023   NA 140 08/14/2023   K 4.2 08/14/2023   CL 107 08/14/2023   CO2 26 08/14/2023   No results found for: "HGBA1C"  Home Medications    Current Outpatient Medications  Medication Sig Dispense Refill   acetaminophen (TYLENOL) 500 MG tablet Take 1,000 mg by mouth every 6 (six) hours as needed for moderate pain.  aspirin 81 MG EC tablet Take 81 mg by mouth daily.     Biotin 62130 MCG TABS Take 10,000 mcg by mouth daily.     Blood Pressure KIT 1 kit by Does not apply route daily. 1 kit 0   Calcium Carb-Cholecalciferol 600-20 MG-MCG TABS Take 1 tablet by mouth daily.     COLLAGEN PO Take 1 capsule by mouth daily. With vitamin c     cyclobenzaprine (FLEXERIL) 10 MG tablet Take 0.5-1 tablets (5-10 mg total) by mouth 3 (three) times daily as needed for muscle spasms. Caution: can cause drowsiness 60 tablet 1   ferrous sulfate 325 (65 FE) MG tablet Take 325 mg by mouth daily.     flecainide (TAMBOCOR) 100 MG tablet Take 1 tablet (100 mg total) by mouth 2 (two) times daily. 180 tablet 3   furosemide (LASIX) 20 MG tablet Take 2 tablets (40 mg total) by mouth daily. (Patient taking  differently: Take 60 mg by mouth daily.) 180 tablet 3   Ginkgo Biloba 60 MG CAPS Take 60 mg by mouth daily.     Glucosamine-Chondroitin (OSTEO BI-FLEX REGULAR STRENGTH PO) Take 2 tablets by mouth daily.     HYDROGEN PEROXIDE EX Apply 1 Application topically daily as needed (scalp itching).     losartan (COZAAR) 100 MG tablet Take 1 tablet (100 mg total) by mouth daily. 90 tablet 3   Lutein 20 MG TABS Take 20 mg by mouth daily.     Multiple Vitamin (MULTIVITAMIN) tablet Take 1 tablet by mouth daily.       Multiple Vitamins-Minerals (EMERGEN-C VITAMIN C) PACK Take 1 packet by mouth daily.     Phenylephrine HCl (SINEX REGULAR NA) Place 1 spray into the nose daily.     Probiotic Product (PROBIOTIC PO) Take 2 capsules by mouth daily.     RED CLOVER LEAF EXTRACT PO Take 1 capsule by mouth daily.     vitamin B-12 (CYANOCOBALAMIN) 1000 MCG tablet Take 1,000 mcg by mouth daily.     vitamin E 180 MG (400 UNITS) capsule Take 400 Units by mouth daily.     No current facility-administered medications for this visit.     Assessment & Plan    HYPERCHOLESTEROLEMIA Assessment: Patient with CAD not at LDL goal of < 70 Most recent LDL 150 on 08/05/23 Not able to tolerate statins secondary to myalgias - atorvastatin, simvastatin Reviewed options for lowering LDL cholesterol, including ezetimibe, PCSK-9 inhibitors, bempedoic acid and inclisiran.  Discussed mechanisms of action, dosing, side effects, potential decreases in LDL cholesterol and costs.  Also reviewed potential options for patient assistance.  Plan: Patient agreeable to starting Leqvio Repeat labs after:  4 months Lipid Liver function    Phillips Hay, PharmD CPP [facility] [address]  [place], [place] [address] [phone]  09/11/2023, 8:30 AM

## 2023-09-11 ENCOUNTER — Encounter: Payer: Self-pay | Admitting: Pharmacist Clinician (PhC)/ Clinical Pharmacy Specialist

## 2023-09-11 ENCOUNTER — Ambulatory Visit (INDEPENDENT_AMBULATORY_CARE_PROVIDER_SITE_OTHER): Payer: 59 | Admitting: Pulmonary Disease

## 2023-09-11 ENCOUNTER — Encounter: Payer: Self-pay | Admitting: Student

## 2023-09-11 VITALS — BP 138/72 | HR 68 | Ht 64.0 in | Wt 255.4 lb

## 2023-09-11 DIAGNOSIS — I10 Essential (primary) hypertension: Secondary | ICD-10-CM | POA: Diagnosis not present

## 2023-09-11 DIAGNOSIS — I428 Other cardiomyopathies: Secondary | ICD-10-CM

## 2023-09-11 DIAGNOSIS — Z95810 Presence of automatic (implantable) cardiac defibrillator: Secondary | ICD-10-CM

## 2023-09-11 DIAGNOSIS — I493 Ventricular premature depolarization: Secondary | ICD-10-CM

## 2023-09-11 DIAGNOSIS — I5022 Chronic systolic (congestive) heart failure: Secondary | ICD-10-CM

## 2023-09-11 LAB — CUP PACEART INCLINIC DEVICE CHECK
Battery Remaining Longevity: 69 mo
Brady Statistic RA Percent Paced: 44 %
Brady Statistic RV Percent Paced: 99.92 %
Date Time Interrogation Session: 20240919130127
HighPow Impedance: 61.875
Implantable Lead Connection Status: 753985
Implantable Lead Connection Status: 753985
Implantable Lead Connection Status: 753985
Implantable Lead Implant Date: 20150303
Implantable Lead Implant Date: 20150303
Implantable Lead Implant Date: 20150303
Implantable Lead Location: 753858
Implantable Lead Location: 753859
Implantable Lead Location: 753860
Implantable Pulse Generator Implant Date: 20230127
Lead Channel Impedance Value: 412.5 Ohm
Lead Channel Impedance Value: 475 Ohm
Lead Channel Impedance Value: 537.5 Ohm
Lead Channel Pacing Threshold Amplitude: 1 V
Lead Channel Pacing Threshold Amplitude: 1.375 V
Lead Channel Pacing Threshold Amplitude: 1.75 V
Lead Channel Pacing Threshold Amplitude: 1.75 V
Lead Channel Pacing Threshold Pulse Width: 0.5 ms
Lead Channel Pacing Threshold Pulse Width: 0.5 ms
Lead Channel Pacing Threshold Pulse Width: 0.7 ms
Lead Channel Pacing Threshold Pulse Width: 0.7 ms
Lead Channel Sensing Intrinsic Amplitude: 12 mV
Lead Channel Sensing Intrinsic Amplitude: 3.1 mV
Lead Channel Setting Pacing Amplitude: 2 V
Lead Channel Setting Pacing Amplitude: 2.25 V
Lead Channel Setting Pacing Amplitude: 2.375
Lead Channel Setting Pacing Pulse Width: 0.5 ms
Lead Channel Setting Pacing Pulse Width: 0.7 ms
Lead Channel Setting Sensing Sensitivity: 0.5 mV
Pulse Gen Serial Number: 111057141
Zone Setting Status: 755011

## 2023-09-11 NOTE — Patient Instructions (Signed)
Medication Instructions:  Your physician recommends that you continue on your current medications as directed. Please refer to the Current Medication list given to you today.  *If you need a refill on your cardiac medications before your next appointment, please call your pharmacy*  Lab Work: None ordered If you have labs (blood work) drawn today and your tests are completely normal, you will receive your results only by: MyChart Message (if you have MyChart) OR A paper copy in the mail If you have any lab test that is abnormal or we need to change your treatment, we will call you to review the results.  Follow-Up: At [facility], you and your health needs are our priority.  As part of our continuing mission to provide you with exceptional heart care, we have created designated Provider Care Teams.  These Care Teams include your primary Cardiologist (physician) and Advanced Practice Providers (APPs -  Physician Assistants and Nurse Practitioners) who all work together to provide you with the care you need, when you need it.  Your next appointment:   6 month(s)  Provider:   Loman Brooklyn, MD

## 2023-09-11 NOTE — Assessment & Plan Note (Signed)
Assessment: Patient with CAD not at LDL goal of < 70 Most recent LDL 150 on 08/05/23 Not able to tolerate statins secondary to myalgias - atorvastatin, simvastatin Reviewed options for lowering LDL cholesterol, including ezetimibe, PCSK-9 inhibitors, bempedoic acid and inclisiran.  Discussed mechanisms of action, dosing, side effects, potential decreases in LDL cholesterol and costs.  Also reviewed potential options for patient assistance.  Plan: Patient agreeable to starting Leqvio Repeat labs after:  4 months Lipid Liver function

## 2023-09-16 ENCOUNTER — Other Ambulatory Visit (HOSPITAL_BASED_OUTPATIENT_CLINIC_OR_DEPARTMENT_OTHER): Payer: Self-pay | Admitting: Pharmacist Clinician (PhC)/ Clinical Pharmacy Specialist

## 2023-09-16 ENCOUNTER — Telehealth: Payer: Self-pay | Admitting: Pharmacy Technician

## 2023-09-16 NOTE — Telephone Encounter (Signed)
Alice Stewart has been denied due to patient has not failed preferred medications. Preferred medication is Repatha or Praluent. Ref: [id number] Phone: [phone] ext [phone]  Please advise.

## 2023-09-19 ENCOUNTER — Telehealth: Payer: Self-pay

## 2023-09-19 ENCOUNTER — Other Ambulatory Visit (HOSPITAL_COMMUNITY): Payer: Self-pay

## 2023-09-19 ENCOUNTER — Telehealth: Payer: Self-pay | Admitting: Pharmacist Clinician (PhC)/ Clinical Pharmacy Specialist

## 2023-09-19 NOTE — Telephone Encounter (Signed)
Pharmacy Patient Advocate Encounter   Received notification from Physician's Office that prior authorization for REPATHA is required/requested.   Insurance verification completed.   The patient is insured through [facility] .   Per test claim: PA required; PA submitted to [facility] via CoverMyMeds Key/confirmation #/EOC [id number] Status is pending

## 2023-09-19 NOTE — Telephone Encounter (Signed)
Pharmacy Patient Advocate Encounter  Received notification from [facility] that Prior Authorization for REPATHA has been APPROVED from 09/19/23 to 03/18/24. Ran test claim, Copay is $0. This test claim was processed through [facility]- copay amounts may vary at other pharmacies due to pharmacy/plan contracts, or as the patient moves through the different stages of their insurance plan.

## 2023-09-19 NOTE — Telephone Encounter (Signed)
PA request has been Submitted. New Encounter created for follow up. For additional info see Pharmacy Prior Auth telephone encounter from 09/19/23.

## 2023-09-19 NOTE — Telephone Encounter (Signed)
Belenda Cruise, Noted. Thanks  @[email or web address] , please d/c treatment plan.  Selena Batten

## 2023-09-19 NOTE — Telephone Encounter (Signed)
We will put her on Repatha.  Thanks for the info

## 2023-09-19 NOTE — Telephone Encounter (Signed)
Please do PA for Repatha

## 2023-09-19 NOTE — Telephone Encounter (Signed)
See other encounter.

## 2023-09-23 MED ORDER — REPATHA SURECLICK 140 MG/ML ~~LOC~~ SOAJ: 140.0000 mg | SUBCUTANEOUS | 3 refills | Status: DC

## 2023-09-23 NOTE — Telephone Encounter (Signed)
Spoke with patient, reviewed injection technique and answered all questions.  Rx sent to [facility]

## 2023-09-23 NOTE — Addendum Note (Signed)
Addended by: Rosalee Kaufman on: 09/23/2023 03:04 PM   Modules accepted: Orders

## 2023-09-26 DIAGNOSIS — G4733 Obstructive sleep apnea (adult) (pediatric): Secondary | ICD-10-CM | POA: Diagnosis not present

## 2023-10-09 ENCOUNTER — Other Ambulatory Visit: Payer: Self-pay | Admitting: Cardiology

## 2023-10-17 ENCOUNTER — Other Ambulatory Visit: Payer: Self-pay | Admitting: Cardiology

## 2023-10-17 ENCOUNTER — Ambulatory Visit (INDEPENDENT_AMBULATORY_CARE_PROVIDER_SITE_OTHER): Payer: 59

## 2023-10-17 DIAGNOSIS — I428 Other cardiomyopathies: Secondary | ICD-10-CM | POA: Diagnosis not present

## 2023-10-17 DIAGNOSIS — I5022 Chronic systolic (congestive) heart failure: Secondary | ICD-10-CM

## 2023-10-17 LAB — CUP PACEART REMOTE DEVICE CHECK
Battery Remaining Longevity: 67 mo
Battery Remaining Percentage: 78 %
Battery Voltage: 2.98 V
Brady Statistic AP VP Percent: 44 %
Brady Statistic AP VS Percent: 1 %
Brady Statistic AS VP Percent: 56 %
Brady Statistic AS VS Percent: 1 %
Brady Statistic RA Percent Paced: 44 %
Date Time Interrogation Session: 20241025020057
HighPow Impedance: 64 Ohm
Implantable Lead Connection Status: 753985
Implantable Lead Connection Status: 753985
Implantable Lead Connection Status: 753985
Implantable Lead Implant Date: 20150303
Implantable Lead Implant Date: 20150303
Implantable Lead Implant Date: 20150303
Implantable Lead Location: 753858
Implantable Lead Location: 753859
Implantable Lead Location: 753860
Implantable Pulse Generator Implant Date: 20230127
Lead Channel Impedance Value: 390 Ohm
Lead Channel Impedance Value: 430 Ohm
Lead Channel Impedance Value: 490 Ohm
Lead Channel Pacing Threshold Amplitude: 0.625 V
Lead Channel Pacing Threshold Amplitude: 1.375 V
Lead Channel Pacing Threshold Amplitude: 1.75 V
Lead Channel Pacing Threshold Pulse Width: 0.5 ms
Lead Channel Pacing Threshold Pulse Width: 0.5 ms
Lead Channel Pacing Threshold Pulse Width: 0.7 ms
Lead Channel Sensing Intrinsic Amplitude: 12 mV
Lead Channel Sensing Intrinsic Amplitude: 3 mV
Lead Channel Setting Pacing Amplitude: 1.625
Lead Channel Setting Pacing Amplitude: 2.25 V
Lead Channel Setting Pacing Amplitude: 2.375
Lead Channel Setting Pacing Pulse Width: 0.5 ms
Lead Channel Setting Pacing Pulse Width: 0.7 ms
Lead Channel Setting Sensing Sensitivity: 0.5 mV
Pulse Gen Serial Number: 111057141
Zone Setting Status: 755011

## 2023-10-20 ENCOUNTER — Ambulatory Visit (INDEPENDENT_AMBULATORY_CARE_PROVIDER_SITE_OTHER): Payer: 59 | Admitting: Obstetrics & Gynecology

## 2023-10-20 ENCOUNTER — Encounter: Payer: Self-pay | Admitting: Obstetrics & Gynecology

## 2023-10-20 ENCOUNTER — Other Ambulatory Visit (HOSPITAL_COMMUNITY)
Admission: RE | Admit: 2023-10-20 | Discharge: 2023-10-20 | Disposition: A | Discharge status: Home / Self Care | Payer: 59 | Source: Ambulatory Visit | Attending: Obstetrics & Gynecology | Admitting: Obstetrics & Gynecology

## 2023-10-20 VITALS — BP 163/64 | HR 64 | Resp 16 | Ht 64.0 in | Wt 256.0 lb

## 2023-10-20 DIAGNOSIS — N760 Acute vaginitis: Secondary | ICD-10-CM | POA: Diagnosis not present

## 2023-10-20 DIAGNOSIS — Z23 Encounter for immunization: Secondary | ICD-10-CM | POA: Diagnosis not present

## 2023-10-20 DIAGNOSIS — I10 Essential (primary) hypertension: Secondary | ICD-10-CM

## 2023-10-20 DIAGNOSIS — B9689 Other specified bacterial agents as the cause of diseases classified elsewhere: Secondary | ICD-10-CM | POA: Insufficient documentation

## 2023-10-20 DIAGNOSIS — Z113 Encounter for screening for infections with a predominantly sexual mode of transmission: Secondary | ICD-10-CM | POA: Diagnosis not present

## 2023-10-20 NOTE — Progress Notes (Signed)
Subjective:    Patient ID: Alice Stewart, female    DOB: [birth year], 67 y.o.   MRN: [id number]  HPI  67 yo female presents for watery discharge.  She was seen in our office May 2024 and negative wet prep.  Another wet prep was sent at an outside office later in May which showed clue cells.   Pt also states she has been sexually active--last 2 weeks ago.  Pt was treated for a yeast infection reacently by another MD and the discharge began after that.   Review of Systems  Constitutional: Negative.   Respiratory: Negative.    Cardiovascular: Negative.   Gastrointestinal: Negative.   Genitourinary:  Positive for vaginal discharge.       Objective:   Physical Exam Vitals reviewed.  Constitutional:      General: She is not in acute distress.    Appearance: She is well-developed.  HENT:     Head: Normocephalic and atraumatic.  Eyes:     Conjunctiva/sclera: Conjunctivae normal.  Cardiovascular:     Rate and Rhythm: Normal rate.  Pulmonary:     Effort: Pulmonary effort is normal.  Genitourinary:    Comments: Small amount of vaginal discharge.  No blood Skin:    General: Skin is warm and dry.  Neurological:     Mental Status: She is alert and oriented to person, place, and time.  Psychiatric:        Mood and Affect: Mood normal.   Vitals:   10/20/23 0907  BP: (!) 194/70  Pulse: 65  Resp: 16  Weight: 256 lb (116.1 kg)  Height: 5\' 4"  (1.626 m)   Assessment & Plan:   67 yo female with history of recurrent BV c/o vaginal discharge. Pt has had intercourse --last was 2 weeks ago.   STI screening Wet prep for BV, yeast, and trich Reviewed notes--Pt responds best to cleocin--will give Rx for that if return + for BV Flu shot today.  HTN-pt just took meds; follow up with PCP if elevated BP continues.

## 2023-10-21 LAB — CERVICOVAGINAL ANCILLARY ONLY
Bacterial Vaginitis (gardnerella): NEGATIVE
Candida Glabrata: NEGATIVE
Candida Vaginitis: NEGATIVE
Chlamydia: NEGATIVE
Comment: NEGATIVE
Comment: NEGATIVE
Comment: NEGATIVE
Comment: NEGATIVE
Comment: NEGATIVE
Comment: NORMAL
Neisseria Gonorrhea: NEGATIVE
Trichomonas: NEGATIVE

## 2023-10-27 NOTE — Progress Notes (Signed)
HPI: FU CM and hypertension.  Previously followed by Mayra Reel, MD in [place]. She has a history of echocardiogram in 2014 showed ejection fraction 25 to 30% with left bundle branch block.  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  She had CRT-D in 2015. Echocardiogram repeated May 2021 showed ejection fraction 55 to 60%.  Her PVCs are now treated with flecainide. Patient previously moved back to this area from [place] state.  Follow-up echocardiogram here September 2022 showed ejection fraction 45 to 50%, moderate left ventricular hypertrophy, grade 1 diastolic dysfunction, mild mitral regurgitation. Patient had ICD generator change January 2023.  Note by report patient did not tolerate carvedilol, Toprol, Entresto, spironolactone, Cardura and recently discontinued labetalol.  She is intolerant to statins.  Sleep study 12/23 with moderate OSA and O2 sats as low as 76. Echo 6/24 showed EF 40-45, mild LVH, grade 2 DD, moderate LAE, mild MR, moderate AI and dilated ascending aorta 39 mm.  Renal Dopplers June 2024 showed no renal artery stenosis.  Coronary CTA August 2024 showed calcium score 38.9 which was 73rd percentile and minimal coronary artery disease.  Renal denervation for her hypertension performed 08/13/23.  Since last seen she has some dyspnea on exertion.  No orthopnea, PND, chest pain or syncope.  Current Outpatient Medications  Medication Sig Dispense Refill   acetaminophen (TYLENOL) 500 MG tablet Take 1,000 mg by mouth every 6 (six) hours as needed for moderate pain.     aspirin 81 MG EC tablet Take 81 mg by mouth daily.     Biotin 08657 MCG TABS Take 10,000 mcg by mouth daily.     Blood Pressure KIT 1 kit by Does not apply route daily. 1 kit 0   Calcium Carb-Cholecalciferol 600-20 MG-MCG TABS Take 1 tablet by mouth daily.     COLLAGEN PO Take 1 capsule by mouth daily. With vitamin c     cyclobenzaprine (FLEXERIL) 10 MG tablet Take 0.5-1  tablets (5-10 mg total) by mouth 3 (three) times daily as needed for muscle spasms. Caution: can cause drowsiness 60 tablet 1   ferrous sulfate 325 (65 FE) MG tablet Take 325 mg by mouth daily.     flecainide (TAMBOCOR) 100 MG tablet Take 1 tablet (100 mg total) by mouth 2 (two) times daily. 180 tablet 3   furosemide (LASIX) 20 MG tablet Take 2 tablets (40 mg total) by mouth daily. 180 tablet 3   Ginkgo Biloba 60 MG CAPS Take 60 mg by mouth daily.     Glucosamine-Chondroitin (OSTEO BI-FLEX REGULAR STRENGTH PO) Take 2 tablets by mouth daily.     HYDROGEN PEROXIDE EX Apply 1 Application topically daily as needed (scalp itching).     losartan (COZAAR) 100 MG tablet Take 1 tablet (100 mg total) by mouth daily. 90 tablet 3   Lutein 20 MG TABS Take 20 mg by mouth daily.     Multiple Vitamin (MULTIVITAMIN) tablet Take 1 tablet by mouth daily.       Multiple Vitamins-Minerals (EMERGEN-C VITAMIN C) PACK Take 1 packet by mouth daily.     Phenylephrine HCl (SINEX REGULAR NA) Place 1 spray into the nose daily.     Probiotic Product (PROBIOTIC PO) Take 2 capsules by mouth daily.     RED CLOVER LEAF EXTRACT PO Take 1 capsule by mouth daily.     vitamin B-12 (CYANOCOBALAMIN) 1000 MCG tablet Take 1,000 mcg by mouth daily.  vitamin E 180 MG (400 UNITS) capsule Take 400 Units by mouth daily.     Evolocumab (REPATHA SURECLICK) 140 MG/ML SOAJ Inject 140 mg into the skin every 14 (fourteen) days. (Patient not taking: Reported on 11/10/2023) 6 mL 3   No current facility-administered medications for this visit.     Past Medical History:  Diagnosis Date   Anemia    Back pain    Benign positional vertigo    Cardiomyopathy (HCC)    Carpal tunnel syndrome    Goiter    nontoxid mutinodular   Hypertension    Hyperthyroidism    Insulin resistance syndrome    Menopausal disorder    Menorrhagia    Migraine    Muscle spasm    trapezius   Neck pain, acute    Obesity    S/P ablation operation for arrhythmia  01/27/2018   Formatting of this note might be different from the original. 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 St. Jude BiV ICD placed on 02/22/14     Seborrheic dermatitis    Thyroid disorder     Past Surgical History:  Procedure Laterality Date   CHOLECYSTECTOMY     [place]   DILATION AND CURETTAGE OF UTERUS  03/23/2008   ENDOMETRIAL ABLATION  03/23/2008   GREAT TOE ARTHRODESIS, METATARSALPHALANGEAL JOINT  05/23/2006   Rt foot   HEEL SPUR SURGERY  10/23/1986   Lt foot   ICD GENERATOR CHANGEOUT N/A 01/18/2022   Procedure: ICD GENERATOR CHANGEOUT;  Surgeon: Regan Lemming, MD;  Location: [facility] INVASIVE CV LAB;  Service: Cardiovascular;  Laterality: N/A;   RENAL DENERVATION Bilateral 08/13/2023   Procedure: RENAL DENERVATION;  Surgeon: Iran Ouch, MD;  Location: [facility] INVASIVE CV LAB;  Service: Cardiovascular;  Laterality: Bilateral;   TUBAL LIGATION  10/23/1989    Social History   Socioeconomic History   Marital status: Divorced    Spouse name: Not on file   Number of children: 3   Years of education: 14   Highest education level: Some college, no degree  Occupational History   Occupation: unemployed    Employer: Statistician   Occupation: legally disabled  Tobacco Use   Smoking status: Former    Current packs/day: 0.00    Types: Cigarettes    Quit date: 09/22/2004    Years since quitting: 19.1   Smokeless tobacco: Never  Vaping Use   Vaping status: Never Used  Substance and Sexual Activity   Alcohol use: Not Currently    Alcohol/week: 1.0 standard drink of alcohol    Types: 1 Standard drinks or equivalent per week    Comment: 1 weekly   Drug use: No   Sexual activity: Yes    Birth control/protection: Post-menopausal  Other Topics Concern   Not on file  Social History Narrative   Lives alone. She has three children. She enjoys  watching television, cooking, baking and sewing.   Social Determinants of Health   Financial Resource Strain: Low Risk  (04/25/2023)   Overall Financial Resource Strain (CARDIA)    Difficulty of Paying Living Expenses: Not hard at all  Food Insecurity: No Food Insecurity (04/25/2023)   Hunger Vital Sign    Worried About Running Out of Food in the Last Year: Never true    Ran Out of Food in the Last Year: Never true  Recent Concern: Food Insecurity -  Food Insecurity Present (01/27/2023)   Hunger Vital Sign    Worried About Running Out of Food in the Last Year: Sometimes true    Ran Out of Food in the Last Year: Never true  Transportation Needs: Unmet Transportation Needs (06/16/2023)   PRAPARE - Transportation    Lack of Transportation (Medical): Yes    Lack of Transportation (Non-Medical): Yes  Physical Activity: Insufficiently Active (04/25/2023)   Exercise Vital Sign    Days of Exercise per Week: 2 days    Minutes of Exercise per Session: 60 min  Stress: No Stress Concern Present (05/09/2023)   Received from [facility], [facility]   [employer] of Occupational Health - Occupational Stress Questionnaire    Feeling of Stress : Only a little  Social Connections: Socially Isolated (04/25/2023)   Social Connection and Isolation Panel [NHANES]    Frequency of Communication with Friends and Family: More than three times a week    Frequency of Social Gatherings with Friends and Family: Once a week    Attends Religious Services: Never    Database administrator or Organizations: No    Attends Banker Meetings: Never    Marital Status: Divorced  Catering manager Violence: Not At Risk (05/09/2023)   Received from [employer], [facility]   HITS    Over the last 12 months how often did your partner physically hurt you?: Never    Over the last 12 months how often did your partner insult you or talk down to you?: Never    Over the last 12 months how often did your partner  threaten you with physical harm?: Never    Over the last 12 months how often did your partner scream or curse at you?: Never    Family History  Problem Relation Age of Onset   Other Mother        MS   Hypertension Father    Hypothyroidism Sister    Hypertension Sister    Diabetes Other     ROS: no fevers or chills, productive cough, hemoptysis, dysphasia, odynophagia, melena, hematochezia, dysuria, hematuria, rash, seizure activity, orthopnea, PND, pedal edema, claudication. Remaining systems are negative.  Physical Exam: Well-developed well-nourished in no acute distress.  Skin is warm and dry.  HEENT is normal.  Neck is supple.  Chest is clear to auscultation with normal expansion.  Cardiovascular exam is regular rate and rhythm.  Abdominal exam nontender or distended. No masses palpated. Extremities show no edema. neuro grossly intact  ECG- personally reviewed  A/P  1 nonischemic cardiomyopathy-will continue ARB but change losartan to irbesartan for improved blood pressure control.  She previously did not tolerate ACE inhibitor, Entresto or any beta-blocker.  Will plan to repeat echocardiogram to see if LV function has improved now that renal denervation has been performed.  2 hypertension-patient has had multiple medication intolerances previously including all beta-blockers, HCTZ, spironolactone, Entresto amlodipine Cardizem clonidine hydralazine minoxidil and Cardura.  She is status post renal denervation and blood pressure remains elevated.  Will change losartan to irbesartan to see if there is some improvement.  Check renal function in 1 week.  She is also scheduled to follow-up in the hypertension clinic.  3 hyperlipidemia-patient is intolerant to statins.  She also states PCSK9 inhibitors called myalgias/arthralgias.  Not clear to me that she will tolerate any additional medications.  4 history of elevated calcium score-continue aspirin.  5 aortic insufficiency-we  will reassess with follow-up echocardiogram.  6 PVCs-we have  elected to continue flecainide despite mildly reduced LV function as she had minimal plaque noted on previous CTA.  Will reassess LV function with echocardiogram.  If LV function decreases further we will need to discontinue.  7 obstructive sleep apnea-continue CPAP.  8 CRT D-per Dr. Elberta Fortis.  Olga Millers, MD

## 2023-11-03 NOTE — Progress Notes (Signed)
Remote ICD transmission.   

## 2023-11-10 ENCOUNTER — Encounter: Payer: Self-pay | Admitting: Cardiology

## 2023-11-10 ENCOUNTER — Ambulatory Visit (INDEPENDENT_AMBULATORY_CARE_PROVIDER_SITE_OTHER): Payer: 59 | Admitting: Cardiology

## 2023-11-10 VITALS — BP 180/78 | HR 60 | Ht 64.0 in | Wt 254.1 lb

## 2023-11-10 DIAGNOSIS — I10 Essential (primary) hypertension: Secondary | ICD-10-CM

## 2023-11-10 DIAGNOSIS — I428 Other cardiomyopathies: Secondary | ICD-10-CM

## 2023-11-10 DIAGNOSIS — I493 Ventricular premature depolarization: Secondary | ICD-10-CM

## 2023-11-10 MED ORDER — IRBESARTAN 300 MG PO TABS: 300.0000 mg | ORAL_TABLET | Freq: Every day | ORAL | 3 refills | Status: DC

## 2023-11-10 NOTE — Patient Instructions (Signed)
Medication Instructions:   STOP LOSARTAN  START IRBESARTAN 300 MG ONCE DAILY  *If you need a refill on your cardiac medications before your next appointment, please call your pharmacy*   Lab Work:  Your physician recommends that you return for lab work in: ONE WEEK-DO NOT NEED TO FAST  If you have labs (blood work) drawn today and your tests are completely normal, you will receive your results only by: MyChart Message (if you have MyChart) OR A paper copy in the mail If you have any lab test that is abnormal or we need to change your treatment, we will call you to review the results.   Follow-Up: At [facility], you and your health needs are our priority.  As part of our continuing mission to provide you with exceptional heart care, we have created designated Provider Care Teams.  These Care Teams include your primary Cardiologist (physician) and Advanced Practice Providers (APPs -  Physician Assistants and Nurse Practitioners) who all work together to provide you with the care you need, when you need it.  We recommend signing up for the patient portal called "MyChart".  Sign up information is provided on this After Visit Summary.  MyChart is used to connect with patients for Virtual Visits (Telemedicine).  Patients are able to view lab/test results, encounter notes, upcoming appointments, etc.  Non-urgent messages can be sent to your provider as well.   To learn more about what you can do with MyChart, go to [email or web address].    Your next appointment:   6 month(s)  Provider:   Olga Millers, MD

## 2023-11-18 DIAGNOSIS — I10 Essential (primary) hypertension: Secondary | ICD-10-CM | POA: Diagnosis not present

## 2023-11-19 LAB — BASIC METABOLIC PANEL
BUN/Creatinine Ratio: 20 (ref 12–28)
BUN: 19 mg/dL (ref 8–27)
CO2: 28 mmol/L (ref 20–29)
Calcium: 9.6 mg/dL (ref 8.7–10.3)
Chloride: 102 mmol/L (ref 96–106)
Creatinine, Ser: 0.94 mg/dL (ref 0.57–1.00)
Glucose: 102 mg/dL — ABNORMAL HIGH (ref 70–99)
Potassium: 4 mmol/L (ref 3.5–5.2)
Sodium: 146 mmol/L — ABNORMAL HIGH (ref 134–144)
eGFR: 67 mL/min/{1.73_m2} (ref >59)

## 2023-11-19 LAB — CBC
Hematocrit: 41 % (ref 34.0–46.6)
Hemoglobin: 13.5 g/dL (ref 11.1–15.9)
MCH: 31.4 pg (ref 26.6–33.0)
MCHC: 32.9 g/dL (ref 31.5–35.7)
MCV: 95 fL (ref 79–97)
Platelets: 281 10*3/uL (ref 150–450)
RBC: 4.3 x10E6/uL (ref 3.77–5.28)
RDW: 12.6 % (ref 11.7–15.4)
WBC: 6.3 10*3/uL (ref 3.4–10.8)

## 2023-11-23 DIAGNOSIS — G4733 Obstructive sleep apnea (adult) (pediatric): Secondary | ICD-10-CM | POA: Diagnosis not present

## 2023-11-24 DIAGNOSIS — G4733 Obstructive sleep apnea (adult) (pediatric): Secondary | ICD-10-CM | POA: Diagnosis not present

## 2023-11-26 ENCOUNTER — Ambulatory Visit (HOSPITAL_BASED_OUTPATIENT_CLINIC_OR_DEPARTMENT_OTHER): Payer: 59 | Admitting: Cardiovascular Disease

## 2023-11-26 ENCOUNTER — Encounter (HOSPITAL_BASED_OUTPATIENT_CLINIC_OR_DEPARTMENT_OTHER): Payer: Self-pay | Admitting: Cardiovascular Disease

## 2023-11-26 VITALS — BP 190/74 | HR 64 | Ht 64.0 in | Wt 259.1 lb

## 2023-11-26 DIAGNOSIS — I1A0 Resistant hypertension: Secondary | ICD-10-CM

## 2023-11-26 DIAGNOSIS — Z5181 Encounter for therapeutic drug level monitoring: Secondary | ICD-10-CM | POA: Diagnosis not present

## 2023-11-26 MED ORDER — INDAPAMIDE 1.25 MG PO TABS: 1.2500 mg | ORAL_TABLET | Freq: Every day | ORAL | 3 refills | Status: DC

## 2023-11-26 MED ORDER — LOSARTAN POTASSIUM 100 MG PO TABS
100.0000 mg | ORAL_TABLET | Freq: Every day | ORAL | 3 refills | Status: AC
Start: 2023-11-26 — End: 2024-11-02

## 2023-11-26 NOTE — Patient Instructions (Addendum)
Medication Instructions:  START INDAPAMIDE 1.25 MG DAILY   STOP IRBESARTAN   RESTART LOSARTAN 100 MG DAILY   Labwork: BMET IN 1 WEEK   Testing/Procedures: NONE  Follow-Up: PHARM D NEXT AVAILABLE   3 MONTHS WITH DR Snelling OR CAITLIN W NP   Any Other Special Instructions Will Be Listed Below (If Applicable). MONITOR YOUR BLOOD PRESSURE AT HOME AND CALL THE OFFICE IF NO IMPROVEMENT   If you need a refill on your cardiac medications before your next appointment, please call your pharmacy.

## 2023-11-26 NOTE — Progress Notes (Signed)
Advanced Hypertension Clinic Follow-up:    Date:  11/26/2023   ID:  Alice Stewart, DOB 1956/06/28, MRN [id number]  PCP:  Alice Butter, NP  Cardiologist:  Alice Millers, MD  Nephrologist:  Referring MD: Alice Butter, NP   CC: Hypertension  History of Present Illness:    Alice Stewart is a 67 y.o. female with a hx of chronic systolic heart failure, nonischemic cardiomyopathy, PVCs, hypertension, prior tobacco abuse, OSA on CPAP, here for follow-up. Prior CT of the abdomen 07/2021 revealed normal adrenal glands.  She wore a 24-hour blood pressure monitor 03/2023 with average blood pressure of 205/85 during waking hours and 208/85 when asleep.  She was seen in the advanced hypertension clinic 06/2023.  She was diagnosed with hypertension around age 57 and it has been difficult to control due to multiple medication intolerances.  She was seen in the ED 05/2023 at which time she was started on minoxidil.  She saw Alice Stewart 06/2023 and blood pressure remained in the 200s on losartan and minoxidil.  They discussed renal denervation and the procedure has been scheduled.  She has struggled with her BP since age 49 and has intolerances to multiple medications as listed below.    Recently Minoxidil was stopped due to increased swelling and weight gain. In the past month she has gained about 10 lbs which is atypical. She also complained of worsening palpitations.  At her visit 06/2023 BP was uncontrolled in the 170s-190s. Renal denervation was recommended.  She underwent ultrasound renal denervation 08/13/23.  She saw Dr. Jens Stewart 11/10/23 and losartan was switch to irbesartan for persistently elevated blood pressure.   Alice Stewart presents with concerns about persistently elevated blood pressure despite recent medication changes. She reports that her home readings have been consistently high, with a recent reading of 230/93. She expresses confusion and frustration as her blood pressure seems to have  increased since switching from Losartan to Irbesartan.  She denies any swelling in her extremities, suggesting that her fluid balance is currently well managed. However, she does report occasional headaches, described as throbbing and often localized to specific areas of the head. These headaches occur at any time of the day but seem to be more frequent in the evenings.  Alice Stewart also mentions her daughter's recent health issues, including potential multiple sclerosis and small vessel disease in the brain. She expresses concern and uncertainty about her daughter's condition and its management.     Previous antihypertensives: Bystolic - nausea Clonidine - "bad side effects", anxious, jittery Carvedilol - swelling Amlodipine-atorvastatin - salty taste, stomach burning, headache Chlorthalidone - headache Diltiazem - dizziness Lisinopril - backache Metoprolol tartrate - nausea, headache, dizziness Spironolactone - diarrhea Hydralazine - chest heaviness, SOB, dizziness, unable to sleep after 1 tablet Labetolol - side effects  Doxazosin - did not tolerate Entresto - dizziness Valsartan - dizziness, memory issues, knee pain, fatigue Losartan HCTZ - ankle swelling Chlorthalidone - nausea Minoxidil  Past Medical History:  Diagnosis Date   Anemia    Back pain    Benign positional vertigo    Cardiomyopathy (HCC)    Carpal tunnel syndrome    Goiter    nontoxid mutinodular   Hypertension    Hyperthyroidism    Insulin resistance syndrome    Menopausal disorder    Menorrhagia    Migraine    Muscle spasm    trapezius   Neck pain, acute    Obesity    S/P ablation operation for arrhythmia 01/27/2018  Formatting of this note might be different from the original. 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 [employer] BiV ICD placed on 02/22/14     Seborrheic  dermatitis    Thyroid disorder     Past Surgical History:  Procedure Laterality Date   CHOLECYSTECTOMY     [place]   DILATION AND CURETTAGE OF UTERUS  03/23/2008   ENDOMETRIAL ABLATION  03/23/2008   GREAT TOE ARTHRODESIS, METATARSALPHALANGEAL JOINT  05/23/2006   Rt foot   HEEL SPUR SURGERY  10/23/1986   Lt foot   ICD GENERATOR CHANGEOUT N/A 01/18/2022   Procedure: ICD GENERATOR CHANGEOUT;  Surgeon: Regan Lemming, MD;  Location: [facility] INVASIVE CV LAB;  Service: Cardiovascular;  Laterality: N/A;   RENAL DENERVATION Bilateral 08/13/2023   Procedure: RENAL DENERVATION;  Surgeon: Alice Ouch, MD;  Location: [facility] INVASIVE CV LAB;  Service: Cardiovascular;  Laterality: Bilateral;   TUBAL LIGATION  10/23/1989    Current Medications: Current Meds  Medication Sig   acetaminophen (TYLENOL) 500 MG tablet Take 1,000 mg by mouth every 6 (six) hours as needed for moderate pain.   aspirin 81 MG EC tablet Take 81 mg by mouth daily.   Biotin 21308 MCG TABS Take 10,000 mcg by mouth daily.   Blood Pressure KIT 1 kit by Does not apply route daily.   Calcium Carb-Cholecalciferol 600-20 MG-MCG TABS Take 1 tablet by mouth daily.   COLLAGEN PO Take 1 capsule by mouth daily. With vitamin c   cyclobenzaprine (FLEXERIL) 10 MG tablet Take 0.5-1 tablets (5-10 mg total) by mouth 3 (three) times daily as needed for muscle spasms. Caution: can cause drowsiness   ferrous sulfate 325 (65 FE) MG tablet Take 325 mg by mouth daily.   flecainide (TAMBOCOR) 100 MG tablet Take 1 tablet (100 mg total) by mouth 2 (two) times daily.   furosemide (LASIX) 20 MG tablet Take 2 tablets (40 mg total) by mouth daily.   Ginkgo Biloba 60 MG CAPS Take 60 mg by mouth daily.   Glucosamine-Chondroitin (OSTEO BI-FLEX REGULAR STRENGTH PO) Take 2 tablets by mouth daily.   HYDROGEN PEROXIDE EX Apply 1 Application topically daily as needed (scalp itching).   indapamide (LOZOL) 1.25 MG tablet Take 1 tablet (1.25 mg total) by mouth  daily.   losartan (COZAAR) 100 MG tablet Take 1 tablet (100 mg total) by mouth daily.   Lutein 20 MG TABS Take 20 mg by mouth daily.   Multiple Vitamin (MULTIVITAMIN) tablet Take 1 tablet by mouth daily.     Multiple Vitamins-Minerals (EMERGEN-C VITAMIN C) PACK Take 1 packet by mouth daily.   Phenylephrine HCl (SINEX REGULAR NA) Place 1 spray into the nose daily.   Probiotic Product (PROBIOTIC PO) Take 2 capsules by mouth daily.   RED CLOVER LEAF EXTRACT PO Take 1 capsule by mouth daily.   vitamin B-12 (CYANOCOBALAMIN) 1000 MCG tablet Take 1,000 mcg by mouth daily.   vitamin E 180 MG (400 UNITS) capsule Take 400 Units by mouth daily.   [DISCONTINUED] irbesartan (AVAPRO) 300 MG tablet Take 1 tablet (300 mg total) by mouth daily.     Allergies:   Hydralazine, Amoxicillin, Duloxetine, Gabapentin, Latex, Bystolic [nebivolol hcl], Clonidine derivatives, Coreg [carvedilol], Estradiol, Amlodipine-atorvastatin, Chlorthalidone, Diltiazem hcl, Lisinopril, Metoprolol tartrate, Simvastatin, and Spironolactone   Social History   Socioeconomic History   Marital status: Divorced    Spouse  name: Not on file   Number of children: 3   Years of education: 14   Highest education level: Some college, no degree  Occupational History   Occupation: unemployed    Employer: [employer]   Occupation: legally disabled  Tobacco Use   Smoking status: Former    Current packs/day: 0.00    Types: Cigarettes    Quit date: 09/22/2004    Years since quitting: 19.1   Smokeless tobacco: Never  Vaping Use   Vaping status: Never Used  Substance and Sexual Activity   Alcohol use: Not Currently    Alcohol/week: 1.0 standard drink of alcohol    Types: 1 Standard drinks or equivalent per week    Comment: 1 weekly   Drug use: No   Sexual activity: Yes    Birth control/protection: Post-menopausal  Other Topics Concern   Not on file  Social History Narrative   Lives alone. She has three children. She enjoys watching  television, cooking, baking and sewing.   Social Determinants of Health   Financial Resource Strain: Low Risk  (04/25/2023)   Overall Financial Resource Strain (CARDIA)    Difficulty of Paying Living Expenses: Not hard at all  Food Insecurity: No Food Insecurity (04/25/2023)   Hunger Vital Sign    Worried About Running Out of Food in the Last Year: Never true    Ran Out of Food in the Last Year: Never true  Recent Concern: Food Insecurity - Food Insecurity Present (01/27/2023)   Hunger Vital Sign    Worried About Running Out of Food in the Last Year: Sometimes true    Ran Out of Food in the Last Year: Never true  Transportation Needs: Unmet Transportation Needs (06/16/2023)   PRAPARE - Administrator, Civil Service (Medical): Yes    Lack of Transportation (Non-Medical): Yes  Physical Activity: Insufficiently Active (04/25/2023)   Exercise Vital Sign    Days of Exercise per Week: 2 days    Minutes of Exercise per Session: 60 min  Stress: No Stress Concern Present (05/09/2023)   Received from [facility], [facility]   [employer] of Occupational Health - Occupational Stress Questionnaire    Feeling of Stress : Only a little  Social Connections: Socially Isolated (04/25/2023)   Social Connection and Isolation Panel [NHANES]    Frequency of Communication with Friends and Family: More than three times a week    Frequency of Social Gatherings with Friends and Family: Once a week    Attends Religious Services: Never    Database administrator or Organizations: No    Attends Engineer, structural: Never    Marital Status: Divorced     Family History: The patient's family history includes Diabetes in an other family member; Hypertension in her father and sister; Hypothyroidism in her sister; Other in her mother.  ROS:   Please see the history of present illness.    (+) Intermittent chest pains (+) Shortness of breath (+) LE edema (+) Back pain All other systems  reviewed and are negative.  EKGs/Labs/Other Studies Reviewed:    Bilateral Renal Artery Dopplers  06/17/2023: Summary:  Largest Aortic Diameter: 2.6 cm    Renal:    Right: Normal size right kidney. Normal right Resistive Index.         Normal cortical thickness of right kidney. No evidence of         right renal artery stenosis. RRV flow present. Cyst(s) noted.  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  Left:  Normal size of left kidney. Normal left Resistive Index.         Normal cortical thickness of the left kidney. No evidence of         left renal artery stenosis. LRV flow present.  Mesenteric:  Normal Celiac artery and Superior Mesenteric artery findings.    Patent IVC.   Echo  06/03/2023:  1. Left ventricular ejection fraction, by estimation, is 40 to 45%. The  left ventricle has mildly decreased function. The left ventricle has no  regional wall motion abnormalities. There is mild concentric left  ventricular hypertrophy. Left ventricular  diastolic parameters are consistent with Grade II diastolic dysfunction  (pseudonormalization).   2. Right ventricular systolic function is normal. The right ventricular  size is normal. There is mildly elevated pulmonary artery systolic  pressure. The estimated right ventricular systolic pressure is 39.6 mmHg.   3. Left atrial size was moderately dilated.   4. The mitral valve is normal in structure. Mild mitral valve  regurgitation. No evidence of mitral stenosis.   5. The aortic valve cusps are not well visualized, however, there is at  least moderate aortic valve regurgitation that appears to be originating  at the right coronary cusp. Consider TEE for further evaluation.   6. Aortic dilatation noted. There is borderline dilatation of the  ascending aorta, measuring 39 mm.   7. The inferior vena cava is dilated in size with <50% respiratory  variability, suggesting  right atrial pressure of 15 mmHg.   24 HR Blood Pressure Monitor  03/2023:   Overall average BP 205/92mmHg   Average BP while awake 201/12mmHg   Average BP while asleep 208/48mmHg   100% of SBPs were >16mmHg while awake and >157mmHg while asleep.   35% of SBPs were >70mmHg while awake and >80Hg while asleep.   EKG:  EKG is personally reviewed. 07/16/2023: Not ordered.  Recent Labs: 05/02/2023: NT-Pro BNP 317 05/29/2023: TSH 3.430 07/16/2023: BNP 114.8 08/05/2023: ALT 16 11/18/2023: BUN 19; Creatinine, Ser 0.94; Hemoglobin 13.5; Platelets 281; Potassium 4.0; Sodium 146   Recent Lipid Panel    Component Value Date/Time   CHOL 231 (H) 08/05/2023 1005   TRIG 152 (H) 08/05/2023 1005   HDL 54 08/05/2023 1005   CHOLHDL 4.3 08/05/2023 1005   CHOLHDL 4.6 04/11/2022 1601   VLDL 22 03/11/2007 1943   LDLCALC 150 (H) 08/05/2023 1005   LDLCALC 145 (H) 04/11/2022 1601    Physical Exam:    VS:  BP (!) 190/74 (BP Location: Left Arm)   Pulse 64   Ht 5\' 4"  (1.626 m)   Wt 259 lb 1.6 oz (117.5 kg)   LMP 09/27/2011   SpO2 93%   BMI 44.47 kg/m  , BMI Body mass index is 44.47 kg/m. GENERAL:  Well appearing HEENT: Pupils equal round and reactive, fundi not visualized, oral mucosa unremarkable NECK:  No jugular venous distention, waveform within normal limits, carotid upstroke brisk and symmetric, no bruits, no thyromegaly LUNGS:  Clear to auscultation bilaterally HEART:  RRR.  PMI not displaced or sustained, S1 and S2 within normal limits, no S3, no S4, no clicks, no rubs, no murmurs ABD:  Flat, positive bowel sounds normal in frequency in pitch, no bruits, no rebound, no guarding, no midline pulsatile mass, no hepatomegaly, no splenomegaly EXT:  2 plus pulses  throughout, no edema, no cyanosis, no clubbing SKIN:  No rashes, no nodules NEURO:  Cranial nerves II through XII grossly intact, motor grossly intact throughout PSYCH:  Cognitively intact, oriented to person place and  time   ASSESSMENT/PLAN:    # Hypertensive Urgency:  Uncontrolled blood pressure with home readings as high as 230/93 and in-office reading of 194/92. Patient recently switched from Losartan to Irbesartan, but blood pressure remains high. Patient reports occasional headaches.  She has many intolerances as listed above.   -Discontinue Irbesartan and resume Losartan at night given that she found it more effective, though this is very uncommon. -Add Indapamide 1.25mg  in the morning. -Continue Furosemide in the morning. -Check basic metabolic panel in 1 week to assess kidney function and potassium levels. -Follow up in 2 weeks to reassess blood pressure control.  # Headaches Patient reports occasional throbbing headaches, more frequent in the evenings. -Continue to monitor. If headaches persist or worsen, consider further evaluation.      Screening for Secondary Hypertension:     05/29/2023    1:21 PM  Causes  Drugs/Herbals Screened     - Comments 05/29/23 encouraged to stop OTC agents  Renovascular HTN Screened     - Comments 05/29/23 renal duplex ordered  Sleep Apnea Screened     - Comments wearing CPAP  Thyroid Disease Screened     - Comments 05/29/23 TSH ordered  Pheochromocytoma Screened     - Comments Prior CT 2022 normal adrenals  Coarctation of the Aorta Screened     - Comments 03/2022 CT no significant carotid stenosis  Compliance Screened     - Comments History of self-adjusting medications    Relevant Labs/Studies:    Latest Ref Rng & Units 11/18/2023    5:02 PM 08/14/2023   12:47 AM 08/05/2023   10:05 AM  Basic Labs  Sodium 134 - 144 mmol/L 146  140  142   Potassium 3.5 - 5.2 mmol/L 4.0  4.2  3.7   Creatinine 0.57 - 1.00 mg/dL 1.61  0.96  0.45        Latest Ref Rng & Units 05/29/2023   10:57 AM 04/11/2022    4:01 PM  Thyroid   TSH 0.450 - 4.500 uIU/mL 3.430  1.89                 06/17/2023   10:47 AM  Renovascular   Renal Artery [place] Completed Yes      Disposition:    FU with Awad Gladd C. Duke Salvia, MD, [facility] in 2-4 weeks  Medication Adjustments/Labs and Tests Ordered: Current medicines are reviewed at length with the patient today.  Concerns regarding medicines are outlined above.   Orders Placed This Encounter  Procedures   Basic metabolic panel   Meds ordered this encounter  Medications   losartan (COZAAR) 100 MG tablet    Sig: Take 1 tablet (100 mg total) by mouth daily.    Dispense:  90 tablet    Refill:  3    D/C IRBESARTAN   indapamide (LOZOL) 1.25 MG tablet    Sig: Take 1 tablet (1.25 mg total) by mouth daily.    Dispense:  90 tablet    Refill:  3     Signed, Chilton Si, MD  11/26/2023 2:21 PM    [facility]

## 2023-12-27 DIAGNOSIS — G4733 Obstructive sleep apnea (adult) (pediatric): Secondary | ICD-10-CM | POA: Diagnosis not present

## 2024-01-06 ENCOUNTER — Ambulatory Visit: Payer: 59 | Attending: Cardiology | Admitting: Pharmacist Clinician (PhC)/ Clinical Pharmacy Specialist

## 2024-01-06 VITALS — BP 187/72 | HR 60

## 2024-01-06 DIAGNOSIS — I1A0 Resistant hypertension: Secondary | ICD-10-CM | POA: Diagnosis not present

## 2024-01-06 MED ORDER — LOSARTAN POTASSIUM 50 MG PO TABS: ORAL_TABLET | ORAL | 3 refills | Status: DC

## 2024-01-06 NOTE — Progress Notes (Signed)
 Office Visit    Patient Name: Alice Stewart Date of Encounter: 01/12/2024  Primary Care Provider:  Willo Mini, NP Primary Cardiologist:  Redell Shallow, MD  Chief Complaint    Hypertension  Significant Past Medical History   HTN RDN done 8/24, pressure now dropping, on losartan  100 mg   CHF Combined chronic systolic and diastolic  CAD Minimal non-obstructive - 38.9 - 73rd percentile      Allergies  Allergen Reactions   Hydralazine  Shortness Of Breath and Palpitations    Heavy weight on chest and severe dizziness   Amoxicillin Diarrhea and Nausea And Vomiting   Duloxetine  Nausea And Vomiting   Gabapentin Other (See Comments)    Feeling groggy   Latex Nausea And Vomiting and Other (See Comments)    Causes vaginitis after medical exams     Bystolic  [Nebivolol  Hcl] Nausea Only   Clonidine  Derivatives Diarrhea    Dizziness   Coreg  [Carvedilol ] Swelling   Estradiol  Itching    Per patient, medication caused her to have a severe yeast reaction.    Amlodipine-Atorvastatin Other (See Comments)    salty taste in mouth, stomach burning, H/A   Chlorthalidone  Nausea Only    Headache   Diltiazem Hcl Other (See Comments)    Dizzy   Lisinopril     backache   Metoprolol  Tartrate Nausea Only    HA, dizziness, lightheaded   Simvastatin Other (See Comments)    Myalgia    Spironolactone Diarrhea    History of Present Illness    Alice Stewart is a 68 y.o. female patient of Dr Shallow, in the office today for advanced hypertension follow up.  I have also seen her in the past for lipid management.  She struggles to understand all the medical phrases that are being tossed her way and I spent much of that visit trying to put all the information into layman's terms.  Dr. Raford saw her in December, at which time her BP was 190/74.   She has had multiple medication intolerances and last August had RDN to try to lower BP.    She has also stopped Repatha  2/2 myalgias.  Today she  returns for follow up.  She doesn't have any home readings today, as she notes her home device doesn't work, mostly reads errors.  Unsure of the brand, she received it from [facility].  She also notes that she never started the indapamide  Dr. Raford prescribed.  Was not aware that she had a new medication to try.    Current Medications:   losartan  100 mg every day,  Previously tried:  hydralazine  - dizziness;  clonidine  - dizziness,  carvedilol  - swelling,  chlorthalidone  - nausea, headache,  diltiazem - dizziness,  metoprolol  - headache, dizziness,  spironolactone - diarrhea,  nebivolol  - nausea,  amlodipine - headache, stomach burning Minoxidil  - swelling, weight gain Lisinopril - backache Labetalol  - side effects Doxazosin  - did not tolerate Valsartan  - dizziness, memory issues, knee pain, fatigue Hydrochlorothiazide  - ankle swelling Irbesartan  - not as effective as losartan   Family Hx:  neither parent had MI, sister had brain tumor; 3 children healthy - oldest daughter recently dx with hypertension, on losartan  about 2 weeks (45)  Social Hx: Tobacco: no Alcohol:  no   Caffeine: coffee (1-3) and diet Dr. Nunzio (1 can per day)  Diet:    more home cooked meals, avoids deli meats, various proteins for dinner, salad and vegetables regularly; snacks on sweets, ice creas  Exercise:  walks neighborhood, at least 3 times per week  - not recently    Accessory Clinical Findings   Lab Results  Component Value Date   CHOL 231 (H) 08/05/2023   HDL 54 08/05/2023   LDLCALC 150 (H) 08/05/2023   TRIG 152 (H) 08/05/2023   CHOLHDL 4.3 08/05/2023    No results found for: LIPOA  Lab Results  Component Value Date   ALT 16 08/05/2023   AST 19 08/05/2023   ALKPHOS 116 08/05/2023   BILITOT 0.6 08/05/2023   Lab Results  Component Value Date   CREATININE 0.94 11/18/2023   BUN 19 11/18/2023   NA 146 (H) 11/18/2023   K 4.0 11/18/2023   CL 102 11/18/2023   CO2 28 11/18/2023   No  results found for: HGBA1C  Home Medications    Current Outpatient Medications  Medication Sig Dispense Refill   aspirin  81 MG EC tablet Take 81 mg by mouth daily.     flecainide  (TAMBOCOR ) 100 MG tablet Take 1 tablet (100 mg total) by mouth 2 (two) times daily. 180 tablet 3   furosemide  (LASIX ) 20 MG tablet Take 2 tablets (40 mg total) by mouth daily. 180 tablet 3   losartan  (COZAAR ) 100 MG tablet Take 1 tablet (100 mg total) by mouth daily. 90 tablet 3   losartan  (COZAAR ) 50 MG tablet Take 50 mg each evening and continue with 100 mg each morning 90 tablet 3   acetaminophen  (TYLENOL ) 500 MG tablet Take 1,000 mg by mouth every 6 (six) hours as needed for moderate pain.     Biotin 89999 MCG TABS Take 10,000 mcg by mouth daily.     Blood Pressure KIT 1 kit by Does not apply route daily. 1 kit 0   Calcium  Carb-Cholecalciferol 600-20 MG-MCG TABS Take 1 tablet by mouth daily.     COLLAGEN PO Take 1 capsule by mouth daily. With vitamin c     cyclobenzaprine  (FLEXERIL ) 10 MG tablet Take 0.5-1 tablets (5-10 mg total) by mouth 3 (three) times daily as needed for muscle spasms. Caution: can cause drowsiness 60 tablet 1   ferrous sulfate  325 (65 FE) MG tablet Take 325 mg by mouth daily.     Ginkgo Biloba 60 MG CAPS Take 60 mg by mouth daily.     Glucosamine-Chondroitin (OSTEO BI-FLEX REGULAR STRENGTH PO) Take 2 tablets by mouth daily.     HYDROGEN PEROXIDE EX Apply 1 Application topically daily as needed (scalp itching).     indapamide  (LOZOL ) 1.25 MG tablet Take 1 tablet (1.25 mg total) by mouth daily. 90 tablet 3   Lutein 20 MG TABS Take 20 mg by mouth daily.     Multiple Vitamin (MULTIVITAMIN) tablet Take 1 tablet by mouth daily.       Multiple Vitamins-Minerals (EMERGEN-C VITAMIN C) PACK Take 1 packet by mouth daily.     Phenylephrine HCl (SINEX REGULAR NA) Place 1 spray into the nose daily.     Probiotic Product (PROBIOTIC PO) Take 2 capsules by mouth daily.     RED CLOVER LEAF EXTRACT PO Take  1 capsule by mouth daily.     vitamin B-12 (CYANOCOBALAMIN) 1000 MCG tablet Take 1,000 mcg by mouth daily.     vitamin E 180 MG (400 UNITS) capsule Take 400 Units by mouth daily.     No current facility-administered medications for this visit.     Assessment & Plan    Resistant hypertension Assessment: BP is uncontrolled in office BP 187/82 mmHg;  above the goal (<130/80). Has multiple medication intolerances, leaving little room for BP improvement.  Discussed this at length, stating that she may have to live with potential damage from hypertension since there are almost no medications left to try Had renal denervation, very little change in pressure at this time Tolerates losartan  well without any side effects Denies SOB, palpitation, chest pain, headaches,or swelling Reiterated the importance of regular exercise and low salt diet   Plan:  Increase losartan  to 150 mg daily, with 100 mg in the mornings and 50 mg in the evenings.  It appears to be the only medication tolerated at this time.   Patient to keep record of BP readings with heart rate and report to us  at the next visit Patient to follow up with Dr. Raford in 2 months  Labs ordered today:  BMET 10-14 days   Allean Mink, PharmD CPP [facility] [address]  [place], [place] [address] [phone]  01/12/2024, 8:51 AM

## 2024-01-06 NOTE — Patient Instructions (Signed)
 Follow up appointment: WITH DR [place] ON MARCH 7 AT THE [facility]  Go to the lab in 10-14 DAYS TO CHECK KIDNEY FUNCTION  Take your BP meds as follows:   TAKE LOSARTAN  100 MG EACH MORNING AND 50 MG EACH EVENING  Check your blood pressure at home daily (if able) and keep record of the readings.  Your blood pressure goal is < 130/80  To check your pressure at home you will need to:  1. Sit up in a chair, with feet flat on the floor and back supported. Do not cross your ankles or legs. 2. Rest your left arm so that the cuff is about heart level. If the cuff goes on your upper arm,  then just relax the arm on the table, arm of the chair or your lap. If you have a wrist cuff, we  suggest relaxing your wrist against your chest (think of it as Pledging the Flag with the  wrong arm).  3. Place the cuff snugly around your arm, about 1 inch above the crook of your elbow. The  cords should be inside the groove of your elbow.  4. Sit quietly, with the cuff in place, for about 5 minutes. After that 5 minutes press the power  button to start a reading. 5. Do not talk or move while the reading is taking place.  6. Record your readings on a sheet of paper. Although most cuffs have a memory, it is often  easier to see a pattern developing when the numbers are all in front of you.  7. You can repeat the reading after 1-3 minutes if it is recommended  Make sure your bladder is empty and you have not had caffeine or tobacco within the last 30 min  Always bring your blood pressure log with you to your appointments. If you have not brought your monitor in to be double checked for accuracy, please bring it to your next appointment.  You can find a list of quality blood pressure cuffs at [email or web address]  Important lifestyle changes to control high blood pressure  Intervention  Effect on the BP  Lose extra pounds and watch your waistline Weight loss is one of the most effective lifestyle changes  for controlling blood pressure. If you're overweight or obese, losing even a small amount of weight can help reduce blood pressure. Blood pressure might go down by about 1 millimeter of mercury (mm Hg) with each kilogram (about 2.2 pounds) of weight lost.  Exercise regularly As a general goal, aim for at least 30 minutes of moderate physical activity every day. Regular physical activity can lower high blood pressure by about 5 to 8 mm Hg.  Eat a healthy diet Eating a diet rich in whole grains, fruits, vegetables, and low-fat dairy products and low in saturated fat and cholesterol. A healthy diet can lower high blood pressure by up to 11 mm Hg.  Reduce salt (sodium) in your diet Even a small reduction of sodium in the diet can improve heart health and reduce high blood pressure by about 5 to 6 mm Hg.  Limit alcohol One drink equals 12 ounces of beer, 5 ounces of wine, or 1.5 ounces of 80-proof liquor.  Limiting alcohol to less than one drink a day for women or two drinks a day for men can help lower blood pressure by about 4 mm Hg.   If you have any questions or concerns please use My Chart to send questions or call the  office at [phone]

## 2024-01-12 ENCOUNTER — Encounter: Payer: Self-pay | Admitting: Pharmacist Clinician (PhC)/ Clinical Pharmacy Specialist

## 2024-01-12 NOTE — Assessment & Plan Note (Signed)
Assessment: BP is uncontrolled in office BP 187/82 mmHg;  above the goal (<130/80). Has multiple medication intolerances, leaving little room for BP improvement.  Discussed this at length, stating that she may have to live with potential damage from hypertension since there are almost no medications left to try Had renal denervation, very little change in pressure at this time Tolerates losartan well without any side effects Denies SOB, palpitation, chest pain, headaches,or swelling Reiterated the importance of regular exercise and low salt diet   Plan:  Increase losartan to 150 mg daily, with 100 mg in the mornings and 50 mg in the evenings.  It appears to be the only medication tolerated at this time.   Patient to keep record of BP readings with heart rate and report to [place] at the next visit Patient to follow up with Dr. Duke Salvia in 2 months  Labs ordered today:  BMET 10-14 days

## 2024-01-16 ENCOUNTER — Ambulatory Visit (INDEPENDENT_AMBULATORY_CARE_PROVIDER_SITE_OTHER): Payer: 59

## 2024-01-16 DIAGNOSIS — I428 Other cardiomyopathies: Secondary | ICD-10-CM

## 2024-01-16 LAB — CUP PACEART REMOTE DEVICE CHECK
Battery Remaining Longevity: 65 mo
Battery Remaining Percentage: 75 %
Battery Voltage: 2.98 V
Brady Statistic AP VP Percent: 48 %
Brady Statistic AP VS Percent: 1 %
Brady Statistic AS VP Percent: 51 %
Brady Statistic AS VS Percent: 1 %
Brady Statistic RA Percent Paced: 48 %
Date Time Interrogation Session: 20250124010036
HighPow Impedance: 62 Ohm
Implantable Lead Connection Status: 753985
Implantable Lead Connection Status: 753985
Implantable Lead Connection Status: 753985
Implantable Lead Implant Date: 20150303
Implantable Lead Implant Date: 20150303
Implantable Lead Implant Date: 20150303
Implantable Lead Location: 753858
Implantable Lead Location: 753859
Implantable Lead Location: 753860
Implantable Pulse Generator Implant Date: 20230127
Lead Channel Impedance Value: 390 Ohm
Lead Channel Impedance Value: 440 Ohm
Lead Channel Impedance Value: 490 Ohm
Lead Channel Pacing Threshold Amplitude: 0.5 V
Lead Channel Pacing Threshold Amplitude: 1.125 V
Lead Channel Pacing Threshold Amplitude: 1.75 V
Lead Channel Pacing Threshold Pulse Width: 0.5 ms
Lead Channel Pacing Threshold Pulse Width: 0.5 ms
Lead Channel Pacing Threshold Pulse Width: 0.7 ms
Lead Channel Sensing Intrinsic Amplitude: 12 mV
Lead Channel Sensing Intrinsic Amplitude: 3.2 mV
Lead Channel Setting Pacing Amplitude: 1.5 V
Lead Channel Setting Pacing Amplitude: 2.125
Lead Channel Setting Pacing Amplitude: 2.25 V
Lead Channel Setting Pacing Pulse Width: 0.5 ms
Lead Channel Setting Pacing Pulse Width: 0.7 ms
Lead Channel Setting Sensing Sensitivity: 0.5 mV
Pulse Gen Serial Number: 111057141
Zone Setting Status: 755011

## 2024-01-27 DIAGNOSIS — G4733 Obstructive sleep apnea (adult) (pediatric): Secondary | ICD-10-CM | POA: Diagnosis not present

## 2024-02-03 ENCOUNTER — Ambulatory Visit (INDEPENDENT_AMBULATORY_CARE_PROVIDER_SITE_OTHER): Payer: 59

## 2024-02-03 VITALS — Ht 64.0 in | Wt 245.0 lb

## 2024-02-03 DIAGNOSIS — M79671 Pain in right foot: Secondary | ICD-10-CM

## 2024-02-03 DIAGNOSIS — Z0000 Encounter for general adult medical examination without abnormal findings: Secondary | ICD-10-CM

## 2024-02-03 DIAGNOSIS — M25561 Pain in right knee: Secondary | ICD-10-CM

## 2024-02-03 DIAGNOSIS — Z13820 Encounter for screening for osteoporosis: Secondary | ICD-10-CM

## 2024-02-03 DIAGNOSIS — Z780 Asymptomatic menopausal state: Secondary | ICD-10-CM | POA: Diagnosis not present

## 2024-02-03 NOTE — Patient Instructions (Addendum)
  Alice Stewart , Thank you for taking time to come for your Medicare Wellness Visit. I appreciate your ongoing commitment to your health goals. Please review the following plan we discussed and let me know if I can assist you in the future.   The Sports Medicine office is in [facility].  Address: [address], [place], [place] [address]  These are the goals we discussed:  Goals       Patient Stated      Lose weight       Patient Stated (pt-stated)      Patient stated that she would like to loose weight       Weight (lb) < 200 lb (90.7 kg)      She would like to lose some weight.         This is a list of the screening recommended for you and due dates:  Health Maintenance  Topic Date Due   Zoster (Shingles) Vaccine (1 of 2) Never done   Pneumonia Vaccine (2 of 2 - PCV) 01/26/2022   DTaP/Tdap/Td vaccine (2 - Td or Tdap) 08/20/2023   COVID-19 Vaccine (4 - 2024-25 season) 08/24/2023   DEXA scan (bone density measurement)  Never done   Mammogram  05/13/2024   Medicare Annual Wellness Visit  02/02/2025   Pap with HPV screening  04/12/2027   Colon Cancer Screening  05/08/2033   Flu Shot  Completed   Hepatitis C Screening  Completed   HIV Screening  Completed   HPV Vaccine  Aged Out

## 2024-02-03 NOTE — Progress Notes (Signed)
Subjective:   Alice Stewart is a 68 y.o. female who presents for Medicare Annual (Subsequent) preventive examination.  Visit Complete: Virtual I connected with  Alice Stewart on 02/03/24 by a audio enabled telemedicine application and verified that I am speaking with the correct person using two identifiers.  Patient Location: Home  Provider Location: Office/Clinic  I discussed the limitations of evaluation and management by telemedicine. The patient expressed understanding and agreed to proceed.  Vital Signs: Because this visit was a virtual/telehealth visit, some criteria may be missing or patient reported. Any vitals not documented were not able to be obtained and vitals that have been documented are patient reported.  Patient Medicare AWV questionnaire was completed by the patient on 02/02/2024; I have confirmed that all information answered by patient is correct and no changes since this date.  Cardiac Risk Factors include: dyslipidemia;hypertension;obesity (BMI >30kg/m2);smoking/ tobacco exposure     Objective:    Today's Vitals   02/03/24 1452  Weight: 245 lb (111.1 kg)  Height: 5\' 4"  (1.626 m)   Body mass index is 42.05 kg/m.     02/03/2024    3:12 PM 08/13/2023    8:20 AM 06/13/2023    2:44 PM 01/31/2023    2:52 PM 01/17/2023    8:22 PM 11/04/2022    3:00 PM 01/18/2022   10:38 AM  Advanced Directives  Does Patient Have a Medical Advance Directive? Yes Yes Yes Yes Yes Yes Yes  Type of Advance Directive Living will Living will Living will Living will Living will Living will Living will  Does patient want to make changes to medical advance directive? No - Patient declined No - Patient declined   No - Patient declined No - Patient declined No - Patient declined  Would patient like information on creating a medical advance directive?      No - Patient declined     Current Medications (verified) Outpatient Encounter Medications as of 02/03/2024  Medication Sig    acetaminophen (TYLENOL) 500 MG tablet Take 1,000 mg by mouth every 6 (six) hours as needed for moderate pain.   aspirin 81 MG EC tablet Take 81 mg by mouth daily.   Biotin [address] MCG TABS Take 10,000 mcg by mouth daily.   Blood Pressure KIT 1 kit by Does not apply route daily.   Calcium Carb-Cholecalciferol 600-20 MG-MCG TABS Take 1 tablet by mouth daily.   COLLAGEN PO Take 1 capsule by mouth daily. With vitamin c   cyclobenzaprine (FLEXERIL) 10 MG tablet Take 0.5-1 tablets (5-10 mg total) by mouth 3 (three) times daily as needed for muscle spasms. Caution: can cause drowsiness   ferrous sulfate 325 (65 FE) MG tablet Take 325 mg by mouth daily.   flecainide (TAMBOCOR) 100 MG tablet Take 1 tablet (100 mg total) by mouth 2 (two) times daily.   furosemide (LASIX) 20 MG tablet Take 2 tablets (40 mg total) by mouth daily.   Ginkgo Biloba 60 MG CAPS Take 60 mg by mouth daily.   Glucosamine-Chondroitin (OSTEO BI-FLEX REGULAR STRENGTH PO) Take 2 tablets by mouth daily.   indapamide (LOZOL) 1.25 MG tablet Take 1 tablet (1.25 mg total) by mouth daily.   losartan (COZAAR) 100 MG tablet Take 1 tablet (100 mg total) by mouth daily.   losartan (COZAAR) 50 MG tablet Take 50 mg each evening and continue with 100 mg each morning   Lutein 20 MG TABS Take 20 mg by mouth daily.   Multiple Vitamin (MULTIVITAMIN) tablet  Take 1 tablet by mouth daily.     Multiple Vitamins-Minerals (EMERGEN-C VITAMIN C) PACK Take 1 packet by mouth daily.   Phenylephrine HCl (SINEX REGULAR NA) Place 1 spray into the nose daily.   Probiotic Product (PROBIOTIC PO) Take 2 capsules by mouth daily.   vitamin B-12 (CYANOCOBALAMIN) 1000 MCG tablet Take 1,000 mcg by mouth daily.   vitamin E 180 MG (400 UNITS) capsule Take 400 Units by mouth daily.   HYDROGEN PEROXIDE EX Apply 1 Application topically daily as needed (scalp itching). (Patient not taking: Reported on 02/03/2024)   RED CLOVER LEAF EXTRACT PO Take 1 capsule by mouth daily. (Patient  not taking: Reported on 02/03/2024)   No facility-administered encounter medications on file as of 02/03/2024.    Allergies (verified) Hydralazine, Amoxicillin, Duloxetine, Gabapentin, Latex, Bystolic [nebivolol hcl], Clonidine derivatives, Coreg [carvedilol], Estradiol, Amlodipine-atorvastatin, Chlorthalidone, Diltiazem hcl, Lisinopril, Metoprolol tartrate, Simvastatin, and Spironolactone   History: Past Medical History:  Diagnosis Date   Anemia    Back pain    Benign positional vertigo    Cardiomyopathy (HCC)    Carpal tunnel syndrome    Goiter    nontoxid mutinodular   Hypertension    Hyperthyroidism    Insulin resistance syndrome    Menopausal disorder    Menorrhagia    Migraine    Muscle spasm    trapezius   Neck pain, acute    Obesity    S/P ablation operation for arrhythmia 01/27/2018   Formatting of this note might be different from the original. 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 St. Jude BiV ICD placed on 02/22/14     Seborrheic dermatitis    Thyroid disorder    Past Surgical History:  Procedure Laterality Date   CHOLECYSTECTOMY     [place]   DILATION AND CURETTAGE OF UTERUS  03/23/2008   ENDOMETRIAL ABLATION  03/23/2008   GREAT TOE ARTHRODESIS, METATARSALPHALANGEAL JOINT  05/23/2006   Rt foot   HEEL SPUR SURGERY  10/23/1986   Lt foot   ICD GENERATOR CHANGEOUT N/A 01/18/2022   Procedure: ICD GENERATOR CHANGEOUT;  Surgeon: Regan Lemming, MD;  Location[facility] INVASIVE CV LAB;  Service: Cardiovascular;  Laterality: N/A;   RENAL DENERVATION Bilateral 08/13/2023   Procedure: RENAL DENERVATION;  Surgeon: Iran Ouch, MD;  Location: [facility] INVASIVE CV LAB;  Service: Cardiovascular;  Laterality: Bilateral;   TUBAL LIGATION  10/23/1989   Family History  Problem Relation Age of Onset   Other Mother        MS   Hypertension  Father    Hypothyroidism Sister    Hypertension Sister    Diabetes Other    Social History   Socioeconomic History   Marital status: Divorced    Spouse name: Not on file   Number of children: 3   Years of education: 14   Highest education level: Some college, no degree  Occupational History   Occupation: unemployed    Employer: Statistician   Occupation: legally disabled  Tobacco Use   Smoking status: Former    Current packs/day: 0.00    Average packs/day: 0.3 packs/day for 28.6 years (7.2 ttl pk-yrs)    Types: Cigarettes    Start date: 01/29/1976    Quit date: 09/22/2004    Years since quitting: 19.3   Smokeless tobacco: Never  Vaping Use   Vaping status: Never Used  Substance and Sexual Activity   Alcohol use: Not Currently    Alcohol/week: 1.0 standard drink of alcohol    Types: 1 Standard drinks or equivalent per week    Comment: 1 weekly   Drug use: No   Sexual activity: Yes    Birth control/protection: Post-menopausal  Other Topics Concern   Not on file  Social History Narrative   She has three children. She enjoys watching television, cooking, baking and sewing.   Social Drivers of Corporate investment banker Strain: Low Risk  (02/03/2024)   Overall Financial Resource Strain (CARDIA)    Difficulty of Paying Living Expenses: Not hard at all  Food Insecurity: No Food Insecurity (02/03/2024)   Hunger Vital Sign    Worried About Running Out of Food in the Last Year: Never true    Ran Out of Food in the Last Year: Never true  Transportation Needs: Unmet Transportation Needs (02/03/2024)   PRAPARE - Transportation    Lack of Transportation (Medical): No    Lack of Transportation (Non-Medical): Yes  Physical Activity: Sufficiently Active (02/03/2024)   Exercise Vital Sign    Days of Exercise per Week: 3 days    Minutes of Exercise per Session: 60 min  Stress: No Stress Concern Present (02/03/2024)   [employer] of Occupational Health - Occupational Stress  Questionnaire    Feeling of Stress : Not at all  Social Connections: Moderately Isolated (02/03/2024)   Social Connection and Isolation Panel [NHANES]    Frequency of Communication with Friends and Family: More than three times a week    Frequency of Social Gatherings with Friends and Family: Once a week    Attends Religious Services: Never    Database administrator or Organizations: No    Attends Engineer, structural: Never    Marital Status: Living with partner    Tobacco Counseling Counseling given: Not Answered   Clinical Intake:  Pre-visit preparation completed: Yes  Pain : No/denies pain     BMI - recorded: 42.05 Nutritional Status: BMI > 30  Obese Nutritional Risks: None Diabetes: No  What is the last grade level you completed in school?: 13.5  Interpreter Needed?: No      Activities of Daily Living    02/03/2024    2:54 PM 02/02/2024    6:15 PM  In your present state of health, do you have any difficulty performing the following activities:  Hearing? 0 0  Vision? 0 0  Difficulty concentrating or making decisions? 1 1  Walking or climbing stairs? 1 1  Dressing or bathing? 0 0  Doing errands, shopping? 0 0  Preparing Food and eating ? N N  Using the Toilet? N N  In the past six months, have you accidently leaked urine? N N  Do you have problems with loss of bowel control? N N  Managing your Medications? N N  Managing your Finances? N N  Housekeeping or managing your Housekeeping? N N    Patient Care Team: Christen Butter, NP as PCP - General (Nurse Practitioner) Lewayne Bunting, MD as PCP - Cardiology (Cardiology) Regan Lemming, MD as PCP - Electrophysiology (Cardiology) Himmelrich, Loree Fee, RD (Inactive) as Dietitian  Indicate any recent Medical Services you may have received from other than [facility] providers in the past year (date may be approximate).     Assessment:   This is a routine wellness examination for  Alice Stewart.  Hearing/Vision screen Hearing Screening - Comments:: Unable to  test  Vision Screening - Comments:: Unable to test    Goals Addressed             This Visit's Progress    Weight (lb) < 200 lb (90.7 kg)   245 lb (111.1 kg)    She would like to lose some weight.       Depression Screen    02/03/2024    3:10 PM 08/28/2023    2:30 PM 02/25/2023    3:08 PM 01/31/2023    3:00 PM 01/31/2023    2:59 PM 04/11/2022    3:08 PM 02/19/2022    3:25 PM  PHQ 2/9 Scores  PHQ - 2 Score 0 0 0 0 0 0 0    Fall Risk    02/03/2024    3:13 PM 02/02/2024    6:15 PM 08/28/2023    2:30 PM 02/25/2023    3:08 PM 01/31/2023    2:59 PM  Fall Risk   Falls in the past year? 0 0 0 0 0  Number falls in past yr: 0 0 0 0 0  Injury with Fall? 0 0 0 0 0  Risk for fall due to : No Fall Risks  No Fall Risks No Fall Risks No Fall Risks  Follow up Falls evaluation completed  Falls evaluation completed Falls evaluation completed Falls evaluation completed    MEDICARE RISK AT HOME: Medicare Risk at Home Any stairs in or around the home?: Yes If so, are there any without handrails?: No Home free of loose throw rugs in walkways, pet beds, electrical cords, etc?: Yes Adequate lighting in your home to reduce risk of falls?: Yes Life alert?: Yes Use of a cane, walker or w/c?: No Grab bars in the bathroom?: Yes Shower chair or bench in shower?: Yes Elevated toilet seat or a handicapped toilet?: No  TIMED UP AND GO:  Was the test performed?  No    Cognitive Function:        02/03/2024    3:14 PM 01/31/2023    3:01 PM 08/25/2021   11:30 AM  6CIT Screen  What Year? 0 points 0 points 0 points  What month? 0 points 0 points 0 points  What time? 0 points 0 points 0 points  Count back from 20 0 points 0 points 0 points  Months in reverse 0 points 0 points 0 points  Repeat phrase 0 points 0 points 0 points  Total Score 0 points 0 points 0 points    Immunizations Immunization History  Administered Date(s)  Administered   Influenza Split 11/26/2011   Influenza Whole 09/15/2013, 09/19/2014, 10/20/2015   Influenza, Seasonal, Injecte, Preservative Fre 09/22/2020, 10/20/2023   Influenza,inj,Quad PF,6+ Mos 09/22/2018   Influenza,inj,quad, With Preservative 08/14/2016   Influenza-Unspecified 09/16/2013, 09/19/2014, 10/20/2015, 10/01/2019   Moderna Sars-Covid-2 Vaccination 02/25/2020, 03/24/2020, 11/23/2020   Pneumococcal Polysaccharide-23 01/26/2021   Tdap 08/19/2013    TDAP status: Due, Education has been provided regarding the importance of this vaccine. Advised may receive this vaccine at local pharmacy or Health Dept. Aware to provide a copy of the vaccination record if obtained from local pharmacy or Health Dept. Verbalized acceptance and understanding.  Flu Vaccine status: Up to date  Pneumococcal vaccine status: Due, Education has been provided regarding the importance of this vaccine. Advised may receive this vaccine at local pharmacy or Health Dept. Aware to provide a copy of the vaccination record if obtained from local pharmacy or Health Dept. Verbalized acceptance and understanding.  Covid-19 vaccine  status: Information provided on how to obtain vaccines.   Qualifies for Shingles Vaccine? Yes   Zostavax completed No   Shingrix Completed?: No.    Education has been provided regarding the importance of this vaccine. Patient has been advised to call insurance company to determine out of pocket expense if they have not yet received this vaccine. Advised may also receive vaccine at local pharmacy or Health Dept. Verbalized acceptance and understanding.  Screening Tests Health Maintenance  Topic Date Due   Zoster Vaccines- Shingrix (1 of 2) Never done   Pneumonia Vaccine 35+ Years old (2 of 2 - PCV) 01/26/2022   Colonoscopy  12/23/2022   DTaP/Tdap/Td (2 - Td or Tdap) 08/20/2023   COVID-19 Vaccine (4 - 2024-25 season) 08/24/2023   DEXA SCAN  Never done   MAMMOGRAM  05/13/2024   Medicare  Annual Wellness (AWV)  02/02/2025   Cervical Cancer Screening (HPV/Pap Cotest)  04/12/2027   INFLUENZA VACCINE  Completed   Hepatitis C Screening  Completed   HIV Screening  Completed   HPV VACCINES  Aged Out    Health Maintenance  Health Maintenance Due  Topic Date Due   Zoster Vaccines- Shingrix (1 of 2) Never done   Pneumonia Vaccine 72+ Years old (2 of 2 - PCV) 01/26/2022   Colonoscopy  12/23/2022   DTaP/Tdap/Td (2 - Td or Tdap) 08/20/2023   COVID-19 Vaccine (4 - 2024-25 season) 08/24/2023   DEXA SCAN  Never done    Colorectal cancer screening: Type of screening: Colonoscopy. Completed 05/09/2023. Repeat every 10 years  Mammogram status: Completed 05/14/2023. Repeat every year  Bone Density status: Ordered 02/03/2024. Pt provided with contact info and advised to call to schedule appt.  Lung Cancer Screening: (Low Dose CT Chest recommended if Age 28-80 years, 20 pack-year currently smoking OR have quit w/in 15years.) does not qualify.   Lung Cancer Screening Referral: n/a  Additional Screening:  Hepatitis C Screening: does qualify; Completed 04/25/2023  Vision Screening: Recommended annual ophthalmology exams for early detection of glaucoma and other disorders of the eye. Is the patient up to date with their annual eye exam?  Yes  Who is the provider or what is the name of the office in which the patient attends annual eye exams? [facility] If pt is not established with a provider, would they like to be referred to a provider to establish care?  N/a .   Dental Screening: Recommended annual dental exams for proper oral hygiene   Community Resource Referral / Chronic Care Management: CRR required this visit?  Yes - Sports Medicine for knee, ankle and foot pain.   CCM required this visit?  No     Plan:     I have personally reviewed and noted the following in the patient's chart:   Medical and social history Use of alcohol, tobacco or illicit drugs  Current  medications and supplements including opioid prescriptions. Patient is not currently taking opioid prescriptions. Functional ability and status Nutritional status Physical activity Advanced directives List of other physicians Hospitalizations, surgeries, and ER visits in previous 12 months Yes Vitals Screenings to include cognitive, depression, and falls Referrals and appointments  In addition, I have reviewed and discussed with patient certain preventive protocols, quality metrics, and best practice recommendations. A written personalized care plan for preventive services as well as general preventive health recommendations were provided to patient.     Esmond Harps, [place]   02/03/2024   After Visit Summary: (Mail) Due  to this being a telephonic visit, the after visit summary with patients personalized plan was offered to patient via mail   Nurse Notes:   Alice Stewart is a 68 y.o. female patient of Christen Butter, NP who had a Medicare Annual Wellness Visit today via telephone. She is disabled and live with her partner. She enjoys watching television, cooking, baking and sewing.   Referral made to sport medicine in [facility] for knee, foot and ankle pain. She would like to get more active this next year.   Recommended Tdap and Shingles vaccine at the pharmacy.  Recommended Prevnar 20 during next office visit.   Ordered Bone Density.

## 2024-02-22 DIAGNOSIS — G4733 Obstructive sleep apnea (adult) (pediatric): Secondary | ICD-10-CM | POA: Diagnosis not present

## 2024-02-24 DIAGNOSIS — G4733 Obstructive sleep apnea (adult) (pediatric): Secondary | ICD-10-CM | POA: Diagnosis not present

## 2024-02-24 NOTE — Progress Notes (Signed)
 Remote ICD transmission.

## 2024-02-25 ENCOUNTER — Other Ambulatory Visit: Payer: 59

## 2024-02-26 ENCOUNTER — Ambulatory Visit: Payer: 59 | Admitting: Medical-Surgical

## 2024-02-26 ENCOUNTER — Encounter: Payer: Self-pay | Admitting: Medical-Surgical

## 2024-02-26 VITALS — BP 140/90 | HR 64 | Resp 20 | Ht 64.0 in | Wt 262.0 lb

## 2024-02-26 DIAGNOSIS — I428 Other cardiomyopathies: Secondary | ICD-10-CM

## 2024-02-26 DIAGNOSIS — G4733 Obstructive sleep apnea (adult) (pediatric): Secondary | ICD-10-CM

## 2024-02-26 DIAGNOSIS — B9689 Other specified bacterial agents as the cause of diseases classified elsewhere: Secondary | ICD-10-CM | POA: Diagnosis not present

## 2024-02-26 DIAGNOSIS — R7303 Prediabetes: Secondary | ICD-10-CM

## 2024-02-26 DIAGNOSIS — N760 Acute vaginitis: Secondary | ICD-10-CM | POA: Diagnosis not present

## 2024-02-26 DIAGNOSIS — I1A0 Resistant hypertension: Secondary | ICD-10-CM | POA: Diagnosis not present

## 2024-02-26 DIAGNOSIS — E782 Mixed hyperlipidemia: Secondary | ICD-10-CM

## 2024-02-26 DIAGNOSIS — E0590 Thyrotoxicosis, unspecified without thyrotoxic crisis or storm: Secondary | ICD-10-CM | POA: Diagnosis not present

## 2024-02-26 LAB — POCT GLYCOSYLATED HEMOGLOBIN (HGB A1C)
HbA1c, POC (prediabetic range): 5.7 % (ref 5.7–6.4)
Hemoglobin A1C: 5.7 % — AB (ref 4.0–5.6)

## 2024-02-26 MED ORDER — CYCLOBENZAPRINE HCL 10 MG PO TABS: 5.0000 mg | ORAL_TABLET | Freq: Three times a day (TID) | ORAL | 1 refills | Status: DC | PRN

## 2024-02-26 MED ORDER — ZEPBOUND 2.5 MG/0.5ML ~~LOC~~ SOAJ: 2.5000 mg | SUBCUTANEOUS | 0 refills | Status: DC

## 2024-02-26 NOTE — Progress Notes (Signed)
        Established patient visit  History, exam, impression, and plan:  1. Pre-diabetes (Primary) Pleasant 68 year old female presenting today with a history of prediabetes. Her last A1c was 5.8% in May '24. Recheck today 5.7%, still prediabetic but holding steady. Discussed recommendations for a low carb diet, weight loss, and regular intentional exercise. Checking random insulin and cortisol levels.  - POCT HgB A1C - Insulin, random - Cortisol  2. Recurrent BV (bacterial vaginosis) No current symptoms. Managed by OBGYN.   3. Resistant hypertension Managed by cardiology. BP remains significantly uncontrolled and options for treatment are limited due to multiple side effects/intolerances to medications. Complains that losartan is making her sleepy all the time but there is nothing else that she has been able to tolerate. On arrival BP 191/73. Manual recheck 140/90.  - CBC with Differential/Platelet - CMP14+EGFR - Lipid panel  4. Mixed hyperlipidemia Hx of HLD but not interested in statin medications at this time due to her multiple intolerances. Checking lipids. Recommend weight loss, exercise, and a low fat heart healthy diet.   5. Hyperthyroidism Checking TSH. - TSH  6. Morbid obesity (HCC) 7. OSA on CPAP 8. Non-ischemic cardiomyopathy (HCC) Interested in options to help with weight loss. She has a history of cardiomyopathy and is also on CPAP for sleep apnea. Her BMI is 44.77. She has tried dieting with increased physical activity in the past but did not have great results and what she did lose, she quickly gained back. Discussed options and the struggle for insurance approval. We will try for Zepbound to start. Plan to work on dietary changes and regular intentional exercise in conjunction with the medication for last results.  - tirzepatide (ZEPBOUND) 2.5 MG/0.5ML Pen; Inject 2.5 mg into the skin once a week. (Patient not taking: Reported on 02/27/2024)  Dispense: 2 mL; Refill:  0   Procedures performed this visit: None.  Return in about 6 months (around 08/28/2024) for chronic disease follow up.  __________________________________ [name], DNP, APRN, FNP-BC Primary Care and Sports Medicine [facility] [place]

## 2024-02-27 ENCOUNTER — Encounter: Payer: Self-pay | Admitting: Medical-Surgical

## 2024-02-27 ENCOUNTER — Ambulatory Visit (HOSPITAL_BASED_OUTPATIENT_CLINIC_OR_DEPARTMENT_OTHER): Payer: 59 | Admitting: Cardiovascular Disease

## 2024-02-27 VITALS — BP 198/80 | HR 64 | Ht 64.0 in | Wt 260.8 lb

## 2024-02-27 DIAGNOSIS — I2510 Atherosclerotic heart disease of native coronary artery without angina pectoris: Secondary | ICD-10-CM | POA: Diagnosis not present

## 2024-02-27 DIAGNOSIS — I10 Essential (primary) hypertension: Secondary | ICD-10-CM | POA: Diagnosis not present

## 2024-02-27 DIAGNOSIS — E782 Mixed hyperlipidemia: Secondary | ICD-10-CM | POA: Diagnosis not present

## 2024-02-27 DIAGNOSIS — I5042 Chronic combined systolic (congestive) and diastolic (congestive) heart failure: Secondary | ICD-10-CM

## 2024-02-27 DIAGNOSIS — I493 Ventricular premature depolarization: Secondary | ICD-10-CM

## 2024-02-27 DIAGNOSIS — I1A0 Resistant hypertension: Secondary | ICD-10-CM | POA: Diagnosis not present

## 2024-02-27 LAB — CMP14+EGFR
ALT: 14 IU/L (ref 0–32)
AST: 16 IU/L (ref 0–40)
Albumin: 4.3 g/dL (ref 3.9–4.9)
Alkaline Phosphatase: 114 IU/L (ref 44–121)
BUN/Creatinine Ratio: 22 (ref 12–28)
BUN: 22 mg/dL (ref 8–27)
Bilirubin Total: 0.8 mg/dL (ref 0.0–1.2)
CO2: 26 mmol/L (ref 20–29)
Calcium: 10 mg/dL (ref 8.7–10.3)
Chloride: 105 mmol/L (ref 96–106)
Creatinine, Ser: 0.99 mg/dL (ref 0.57–1.00)
Globulin, Total: 2.9 g/dL (ref 1.5–4.5)
Glucose: 99 mg/dL (ref 70–99)
Potassium: 4.5 mmol/L (ref 3.5–5.2)
Sodium: 144 mmol/L (ref 134–144)
Total Protein: 7.2 g/dL (ref 6.0–8.5)
eGFR: 63 mL/min/{1.73_m2} (ref >59)

## 2024-02-27 LAB — CBC WITH DIFFERENTIAL/PLATELET
Basophils Absolute: 0.1 10*3/uL (ref 0.0–0.2)
Basos: 1 %
EOS (ABSOLUTE): 0.3 10*3/uL (ref 0.0–0.4)
Eos: 5 %
Hematocrit: 39.2 % (ref 34.0–46.6)
Hemoglobin: 13.2 g/dL (ref 11.1–15.9)
Immature Grans (Abs): 0 10*3/uL (ref 0.0–0.1)
Immature Granulocytes: 0 %
Lymphocytes Absolute: 2 10*3/uL (ref 0.7–3.1)
Lymphs: 32 %
MCH: 32.2 pg (ref 26.6–33.0)
MCHC: 33.7 g/dL (ref 31.5–35.7)
MCV: 96 fL (ref 79–97)
Monocytes Absolute: 0.6 10*3/uL (ref 0.1–0.9)
Monocytes: 9 %
Neutrophils Absolute: 3.3 10*3/uL (ref 1.4–7.0)
Neutrophils: 53 %
Platelets: 295 10*3/uL (ref 150–450)
RBC: 4.1 x10E6/uL (ref 3.77–5.28)
RDW: 12.4 % (ref 11.7–15.4)
WBC: 6.2 10*3/uL (ref 3.4–10.8)

## 2024-02-27 LAB — LIPID PANEL
Chol/HDL Ratio: 4.7 ratio — ABNORMAL HIGH (ref 0.0–4.4)
Cholesterol, Total: 241 mg/dL — ABNORMAL HIGH (ref 100–199)
HDL: 51 mg/dL (ref >39)
LDL Chol Calc (NIH): 167 mg/dL — ABNORMAL HIGH (ref 0–99)
Triglycerides: 130 mg/dL (ref 0–149)
VLDL Cholesterol Cal: 23 mg/dL (ref 5–40)

## 2024-02-27 LAB — INSULIN, RANDOM: INSULIN: 24.9 u[IU]/mL (ref 2.6–24.9)

## 2024-02-27 LAB — CORTISOL: Cortisol: 6.3 ug/dL (ref 6.2–19.4)

## 2024-02-27 LAB — TSH: TSH: 2.29 u[IU]/mL (ref 0.450–4.500)

## 2024-02-27 NOTE — Progress Notes (Addendum)
 Advanced Hypertension Clinic Follow-up:    Date:  02/29/2024   ID:  Alice Stewart, DOB May 18, 1956, MRN [id number]  PCP:  Christen Butter, NP  Cardiologist:  Olga Millers, MD   Referring MD: Christen Butter, NP   CC: Hypertension  History of Present Illness:    Alice Stewart is a 68 y.o. female with a hx of HFmrEF, non-obstructive CAD, nonischemic cardiomyopathy, PVCs, hypertension s/p RND, prior tobacco abuse, OSA on CPAP, here for follow-up. Prior CT of the abdomen 07/2021 revealed normal adrenal glands.  She wore a 24-hour blood pressure monitor 03/2023 with average blood pressure of 205/85 during waking hours and 208/85 when asleep.  She was seen in the advanced hypertension clinic 06/2023.  She was diagnosed with hypertension around age 62 and it has been difficult to control due to multiple medication intolerances.  She was seen in the ED 05/2023 at which time she was started on minoxidil.  She saw Alice Stewart 06/2023 and blood pressure remained in the 200s on losartan and minoxidil.  They discussed renal denervation and the procedure has been scheduled.  She has struggled with her BP since age 4 and has intolerances to multiple medications as listed below.    Minoxidil was stopped due to increased swelling and weight gain. In the past month she has gained about 10 lbs which is atypical. She also complained of worsening palpitations.  At her visit 06/2023 BP was uncontrolled in the 170s-190s. Renal denervation was recommended.  She underwent ultrasound renal denervation 08/13/23.  She saw Dr. Jens Som 11/10/23 and losartan was switch to irbesartan for persistently elevated blood pressure.   BP was 230/93-->194/92 in clinic 11/2023.  She wasn't tolerating irbesartan so switched back to losartan.  Indapamide was added.  She stopped Repatha due to myalgias.  She followed up with our pharmacist 12/2023 I had not been checking her blood pressure at home.  Blood pressure was 187/82.  Losartan was  increased to 100 mg in the morning and 50 at night.   Alice Stewart experiences episodes of high blood pressure and palpitations, with discomfort under her rib cage radiating to the left side. These symptoms are intermittent, and she wonders if her PPM needs interrogation.   No abnormalities have been noted on her remote interrogations.    She has a history of intolerance to statins, which caused gastrointestinal side effects such as diarrhea and nausea. Indapamide, prescribed for blood pressure, led to nausea and headaches, prompting her to discontinue it. She does not monitor her blood pressure at home due to discomfort and inconsistent readings.  She has experienced weight gain and craves sugar, which she finds difficult to manage. She attempts to manage her diet by reducing salt and sugar intake, substituting with fruits and vegetables, and is interested in learning more about a cardiac diet.  She experiences joint pain, particularly in her knees, ankles, and along her spine, and uses muscle relaxers, Tylenol, and a heating pad for relief.    She struggles with physical activity due to shortness of breath and joint pain. She walks when possible but lacks transportation to a gym.  She notes that she is unable to exercise due to chronic fatigue that she attributes to "viruses in my body reactivating."  When she exercises she is very tired for days after, so she decided to stop doing it.     Previous antihypertensives: Bystolic - nausea Clonidine - "bad side effects", anxious, jittery Carvedilol - swelling Amlodipine-atorvastatin - salty taste,  stomach burning, headache Chlorthalidone - headache Diltiazem - dizziness Lisinopril - backache Metoprolol tartrate - nausea, headache, dizziness Spironolactone - diarrhea Hydralazine - chest heaviness, SOB, dizziness, unable to sleep after 1 tablet Labetolol - side effects  Doxazosin - did not tolerate Entresto - dizziness Valsartan - dizziness,  memory issues, knee pain, fatigue Losartan HCTZ - ankle swelling Chlorthalidone - nausea Minoxidil Indapamide-nausea, HA  Past Medical History:  Diagnosis Date   Anemia    Back pain    Benign positional vertigo    Cardiomyopathy (HCC)    Carpal tunnel syndrome    Goiter    nontoxid mutinodular   Hypertension    Hyperthyroidism    Insulin resistance syndrome    Menopausal disorder    Menorrhagia    Migraine    Muscle spasm    trapezius   Neck pain, acute    Obesity    S/P ablation operation for arrhythmia 01/27/2018   Formatting of this note might be different from the original. 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 St[employer] Jude BiV ICD placed on 02/22/14     Seborrheic dermatitis    Thyroid disorder     Past Surgical History:  Procedure Laterality Date   CHOLECYSTECTOMY     [place]   DILATION AND CURETTAGE OF UTERUS  03/23/2008   ENDOMETRIAL ABLATION  03/23/2008   GREAT TOE ARTHRODESIS, METATARSALPHALANGEAL JOINT  05/23/2006   Rt foot   HEEL SPUR SURGERY  10/23/1986   Lt foot   ICD GENERATOR CHANGEOUT N/A 01/18/2022   Procedure: ICD GENERATOR CHANGEOUT;  Surgeon: Regan Lemming, MD;  Location: [facility] INVASIVE CV LAB;  Service: Cardiovascular;  Laterality: N/A;   RENAL DENERVATION Bilateral 08/13/2023   Procedure: RENAL DENERVATION;  Surgeon: Iran Ouch, MD;  Location: [facility] INVASIVE CV LAB;  Service: Cardiovascular;  Laterality: Bilateral;   TUBAL LIGATION  10/23/1989    Current Medications: Current Meds  Medication Sig   acetaminophen (TYLENOL) 500 MG tablet Take 1,000 mg by mouth every 6 (six) hours as needed for moderate pain.   aspirin 81 MG EC tablet Take 81 mg by mouth daily.   Biotin 42595 MCG TABS Take 10,000 mcg by mouth daily.   Blood Pressure KIT 1 kit by Does not apply route daily.   COLLAGEN PO Take 1 capsule by  mouth daily. With vitamin c   cyclobenzaprine (FLEXERIL) 10 MG tablet Take 0.5-1 tablets (5-10 mg total) by mouth 3 (three) times daily as needed for muscle spasms. Caution: can cause drowsiness   ferrous sulfate 325 (65 FE) MG tablet Take 325 mg by mouth daily.   flecainide (TAMBOCOR) 100 MG tablet Take 1 tablet (100 mg total) by mouth 2 (two) times daily.   Ginkgo Biloba 60 MG CAPS Take 60 mg by mouth daily.   Glucosamine-Chondroitin (OSTEO BI-FLEX REGULAR STRENGTH PO) Take 2 tablets by mouth daily.   HYDROGEN PEROXIDE EX Apply 1 Application topically daily as needed (scalp itching).   losartan (COZAAR) 50 MG tablet Take 50 mg each evening and continue with 100 mg each morning   Multiple Vitamin (MULTIVITAMIN) tablet Take 1 tablet by mouth daily.     Multiple Vitamins-Minerals (EMERGEN-C VITAMIN C) PACK Take 1 packet by mouth daily.   Phenylephrine HCl (SINEX REGULAR NA) Place 1 spray into the nose daily.   Probiotic Product (PROBIOTIC PO) Take 2 capsules by mouth  daily.   vitamin B-12 (CYANOCOBALAMIN) 1000 MCG tablet Take 1,000 mcg by mouth daily.   vitamin E 180 MG (400 UNITS) capsule Take 400 Units by mouth daily.     Allergies:   Hydralazine, Amoxicillin, Duloxetine, Gabapentin, Latex, Bystolic [nebivolol hcl], Clonidine derivatives, Coreg [carvedilol], Estradiol, Amlodipine-atorvastatin, Chlorthalidone, Diltiazem hcl, Lisinopril, Metoprolol tartrate, Simvastatin, and Spironolactone   Social History   Socioeconomic History   Marital status: Divorced    Spouse name: Not on file   Number of children: 3   Years of education: 14   Highest education level: Associate degree: occupational, Scientist, product/process development, or vocational program  Occupational History   Occupation: unemployed    Employer: [employer]   Occupation: legally disabled  Tobacco Use   Smoking status: Former    Current packs/day: 0.00    Average packs/day: 0.3 packs/day for 28.6 years (7.2 ttl pk-yrs)    Types: Cigarettes    Start  date: 01/29/1976    Quit date: 09/22/2004    Years since quitting: 19.4   Smokeless tobacco: Never  Vaping Use   Vaping status: Never Used  Substance and Sexual Activity   Alcohol use: Not Currently    Alcohol/week: 1.0 standard drink of alcohol    Types: 1 Standard drinks or equivalent per week    Comment: 1 weekly   Drug use: No   Sexual activity: Yes    Birth control/protection: Post-menopausal  Other Topics Concern   Not on file  Social History Narrative   She has three children. She enjoys watching television, cooking, baking and sewing.   Social Drivers of Corporate investment banker Strain: Low Risk  (02/23/2024)   Overall Financial Resource Strain (CARDIA)    Difficulty of Paying Living Expenses: Not hard at all  Food Insecurity: Food Insecurity Present (02/23/2024)   Hunger Vital Sign    Worried About Running Out of Food in the Last Year: Sometimes true    Ran Out of Food in the Last Year: Sometimes true  Transportation Needs: Unmet Transportation Needs (02/23/2024)   PRAPARE - Administrator, Civil Service (Medical): Yes    Lack of Transportation (Non-Medical): Yes  Physical Activity: Insufficiently Active (02/23/2024)   Exercise Vital Sign    Days of Exercise per Week: 2 days    Minutes of Exercise per Session: 30 min  Stress: No Stress Concern Present (02/23/2024)   [employer] of Occupational Health - Occupational Stress Questionnaire    Feeling of Stress : Only a little  Social Connections: Moderately Integrated (02/23/2024)   Social Connection and Isolation Panel [NHANES]    Frequency of Communication with Friends and Family: More than three times a week    Frequency of Social Gatherings with Friends and Family: Once a week    Attends Religious Services: More than 4 times per year    Active Member of [employer] or Organizations: No    Attends Banker Meetings: Never    Marital Status: Living with partner  Recent Concern: Social Connections -  Moderately Isolated (02/03/2024)   Social Connection and Isolation Panel [NHANES]    Frequency of Communication with Friends and Family: More than three times a week    Frequency of Social Gatherings with Friends and Family: Once a week    Attends Religious Services: Never    Database administrator or Organizations: No    Attends Banker Meetings: Never    Marital Status: Living with partner     Family History:  The patient's family history includes Diabetes in an other family member; Hypertension in her father and sister; Hypothyroidism in her sister; Other in her mother.  ROS:   Please see the history of present illness.    (+) Intermittent chest pains (+) Shortness of breath (+) LE edema (+) Back pain All other systems reviewed and are negative.  EKGs/Labs/Other Studies Reviewed:    Bilateral Renal Artery Dopplers  06/17/2023: Summary:  Largest Aortic Diameter: 2.6 cm    Renal:    Right: Normal size right kidney. Normal right Resistive Index.         Normal cortical thickness of right kidney. No evidence of         right renal artery stenosis. RRV flow present. Cyst(s) noted.         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  Left:  Normal size of left kidney. Normal left Resistive Index.         Normal cortical thickness of the left kidney. No evidence of         left renal artery stenosis. LRV flow present.  Mesenteric:  Normal Celiac artery and Superior Mesenteric artery findings.    Patent IVC.   Echo  06/03/2023:  1. Left ventricular ejection fraction, by estimation, is 40 to 45%. The  left ventricle has mildly decreased function. The left ventricle has no  regional wall motion abnormalities. There is mild concentric left  ventricular hypertrophy. Left ventricular  diastolic parameters are consistent with Grade II diastolic dysfunction  (pseudonormalization).   2. Right ventricular systolic  function is normal. The right ventricular  size is normal. There is mildly elevated pulmonary artery systolic  pressure. The estimated right ventricular systolic pressure is 39.6 mmHg.   3. Left atrial size was moderately dilated.   4. The mitral valve is normal in structure. Mild mitral valve  regurgitation. No evidence of mitral stenosis.   5. The aortic valve cusps are not well visualized, however, there is at  least moderate aortic valve regurgitation that appears to be originating  at the right coronary cusp. Consider TEE for further evaluation.   6. Aortic dilatation noted. There is borderline dilatation of the  ascending aorta, measuring 39 mm.   7. The inferior vena cava is dilated in size with <50% respiratory  variability, suggesting right atrial pressure of 15 mmHg.   24 HR Blood Pressure Monitor  03/2023:   Overall average BP 205/37mmHg   Average BP while awake 201/51mmHg   Average BP while asleep 208/46mmHg   100% of SBPs were >180mmHg while awake and >144mmHg while asleep.   35% of SBPs were >81mmHg while awake and >80Hg while asleep.   EKG:  EKG is personally reviewed. 07/16/2023: Not ordered.  Recent Labs: 05/02/2023: NT-Pro BNP 317 07/16/2023: BNP 114.8 02/26/2024: ALT 14; BUN 22; Creatinine, Ser 0.99; Hemoglobin 13.2; Platelets 295; Potassium 4.5; Sodium 144; TSH 2.290   Recent Lipid Panel    Component Value Date/Time   CHOL 241 (H) 02/26/2024 1425   TRIG 130 02/26/2024 1425   HDL 51 02/26/2024 1425   CHOLHDL 4.7 (H) 02/26/2024 1425   CHOLHDL 4.6 04/11/2022 1601   VLDL 22 03/11/2007 1943   LDLCALC 167 (H) 02/26/2024 1425   LDLCALC 145 (H) 04/11/2022 1601    Physical Exam:    VS:  BP (!) 198/80   Pulse 64  Ht 5\' 4"  (1.626 m)   Wt 260 lb 12.8 oz (118.3 kg)   LMP 09/27/2011   SpO2 97%   BMI 44.77 kg/m  , BMI Body mass index is 44.77 kg/m. GENERAL:  Well appearing HEENT: Pupils equal round and reactive, fundi not visualized, oral mucosa  unremarkable NECK:  No jugular venous distention, waveform within normal limits, carotid upstroke brisk and symmetric, no bruits, no thyromegaly LUNGS:  Clear to auscultation bilaterally HEART:  RRR.  PMI not displaced or sustained, S1 and S2 within normal limits, no S3, no S4, no clicks, no rubs, no murmurs ABD:  Flat, positive bowel sounds normal in frequency in pitch, no bruits, no rebound, no guarding, no midline pulsatile mass, no hepatomegaly, no splenomegaly EXT:  2 plus pulses throughout, trace LE edema, no cyanosis, no clubbing SKIN:  No rashes, no nodules NEURO:  Cranial nerves II through XII grossly intact, motor grossly intact throughout PSYCH:  Cognitively intact, oriented to person place and time   ASSESSMENT/PLAN:    # Hypertension Uncontrolled with reported side effects from multiple antihypertensive medications as listed above. Patient reported nausea and "swimmy headache" with indapamide. Lasix reportedly caused kidney pain. Fortunately BP is not as elevated at home as here. -Continue current antihypertensive regimen.   -Encourage increased physical activity and adherence to a low-sodium diet.  # Hyperlipidemia Patient reports intolerance to statins and Repatha due to gastrointestinal side effects. -Discuss potential benefits and risks of reattempting statin therapy.  She declines. -Encourage dietary modifications to lower cholesterol, including a Mediterranean-style diet. -Encouraged exercise attempts.   # Pre-diabetes Patient reports difficulty controlling sugar cravings and has been prescribed Zepbound -Encourage her to try the Zepbound and a low-sugar diet. -Provide patient with educational materials on a heart-healthy diet. -Consider referral to a nutritionist when available.  # Morbid Obesity:  Patient reports significant weight gain and difficulty losing weight. -Encourage increased physical activity and adherence to a low-sugar, heart-healthy diet. -Continue  Zepbound as prescribed.  #HFmrEF:  Optimal GDMT is limited by medication intolerance.  Follow-up in 6 months to reassess blood pressure control, lipid management, and weight loss progress.      Screening for Secondary Hypertension:     05/29/2023    1:21 PM  Causes  Drugs/Herbals Screened     - Comments 05/29/23 encouraged to stop OTC agents  Renovascular HTN Screened     - Comments 05/29/23 renal duplex ordered  Sleep Apnea Screened     - Comments wearing CPAP  Thyroid Disease Screened     - Comments 05/29/23 TSH ordered  Pheochromocytoma Screened     - Comments Prior CT 2022 normal adrenals  Coarctation of the Aorta Screened     - Comments 03/2022 CT no significant carotid stenosis  Compliance Screened     - Comments History of self-adjusting medications    Relevant Labs/Studies:    Latest Ref Rng & Units 02/26/2024    2:25 PM 11/18/2023    5:02 PM 08/14/2023   12:47 AM  Basic Labs  Sodium 134 - 144 mmol/L 144  146  140   Potassium 3.5 - 5.2 mmol/L 4.5  4.0  4.2   Creatinine 0.57 - 1.00 mg/dL 1.61  0.96  0.45        Latest Ref Rng & Units 02/26/2024    2:25 PM 05/29/2023   10:57 AM  Thyroid   TSH 0.450 - 4.500 uIU/mL 2.290  3.430  06/17/2023   10:47 AM  Renovascular   Renal Artery [place] Completed Yes     Disposition:    FU with Annitta Fifield C. Duke Salvia, MD, [facility] in 6 months  Medication Adjustments/Labs and Tests Ordered: Current medicines are reviewed at length with the patient today.  Concerns regarding medicines are outlined above.   No orders of the defined types were placed in this encounter.  No orders of the defined types were placed in this encounter.    Signed, Chilton Si, MD  02/29/2024 9:10 AM    [facility]

## 2024-02-27 NOTE — Patient Instructions (Signed)
 Medication Instructions:  Your physician recommends that you continue on your current medications as directed. Please refer to the Current Medication list given to you today.   Labwork: NONE  Testing/Procedures: NONE  Follow-Up: 6 MONTHS WITH DR [place] OR Ronn Melena NP   Any Other Special Instructions Will Be Listed Below (If Applicable). INCREASE EXERCISE   DRINK 2 GLASSES OF WATER PRIOR TO EATING   If you need a refill on your cardiac medications before your next appointment, please call your pharmacy.

## 2024-02-28 ENCOUNTER — Encounter: Payer: Self-pay | Admitting: Medical-Surgical

## 2024-02-29 ENCOUNTER — Encounter (HOSPITAL_BASED_OUTPATIENT_CLINIC_OR_DEPARTMENT_OTHER): Payer: Self-pay | Admitting: Cardiovascular Disease

## 2024-03-01 NOTE — Telephone Encounter (Signed)
 Attempted call to patient. Left detailed voice mail message with contact information to return our call to schld a nurse visit for u/a and urine culture.

## 2024-03-01 NOTE — Telephone Encounter (Signed)
 Ok to schedule for a nurse visit for urinalysis and possible culture.

## 2024-03-02 ENCOUNTER — Telehealth: Payer: Self-pay

## 2024-03-02 NOTE — Telephone Encounter (Signed)
 Copied from CRM [id number]. Topic: Clinical - Prescription Issue >> Feb 27, 2024 12:49 PM Nila Nephew wrote: Reason for CRM: Patient calling in to request a PA be submitted for ZEPBOUND. Patient would "like to pick it up today but will accept as late as next week".

## 2024-03-05 NOTE — Telephone Encounter (Signed)
 Prior auth for: ZEPBOUND 2.5 mg Determination: Pending as of 03/04/24 Auth #: [id number] Valid from: n/a Patient notified via MyChart

## 2024-03-07 NOTE — Progress Notes (Unsigned)
 Cardiology Office Note:  .   Date:  03/07/2024  ID:  Alice Stewart, DOB Jul 15, 1956, MRN [id number] PCP: Christen Butter, NP  [facility] Providers Cardiologist:  Olga Millers, MD Electrophysiologist:  [name], MD {  History of Present Illness: Alice Stewart is a 68 y.o. female w/PMHx of  Chronic CHF, LBBB, NICM CRT-D RVOT PVCs HTN >>  s/p renal denervation  Saw Brandi 09/11/23, doing well, 99% BP, discussed flecainide and need to stop if LVEF was to worsen  Seeing Drs Jens Som and Duke Salvia ([facility]) since then Dr. Duke Salvia 02/27/24, numerous medication intolerances for her HTN as well as statin intol,  Bystolic - nausea Clonidine - "bad side effects", anxious, jittery Carvedilol - swelling Amlodipine-atorvastatin - salty taste, stomach burning, headache Chlorthalidone - headache Diltiazem - dizziness Lisinopril - backache Metoprolol tartrate - nausea, headache, dizziness Spironolactone - diarrhea Hydralazine - chest heaviness, SOB, dizziness, unable to sleep after 1 tablet Labetolol - side effects  Doxazosin - did not tolerate Entresto - dizziness Valsartan - dizziness, memory issues, knee pain, fatigue Losartan HCTZ - ankle swelling Chlorthalidone - nausea Minoxidil Indapamide-nausea, HA  Home BPs better then office Struggling with weight despite Zepbound  Today's visit is scheduled as a 6 mo flecainide visit  ROS:   She reports some SOB, not new, perhaps more noted in the last year When 1st laying down at night, another example when laying back in the bath tub. Not with exertion + palpitations, these are unchanged for years  No near syncope or syncope No CP  Inquires about her candidacy for liposuction Pending ZepBound start via her PMD  Device information SJM CRT-D implanted 02/22/2014, gen change 01/18/22  Arrhythmia/AAD hx PVCs 2019 unsuccessful PVC ablation Flecainide goes back to 2019  Studies Reviewed: Marland Kitchen    EKG done  today and reviewed by myself:  AV paced 60bpm, no PVCs  DEVICE interrogation done today and reviewed by myself Battery and lead measurements are good No arrhythmias AMS are false for FF on the atrial channel (seen for her in the past as well) These result in as/VS briefly her BP% remains excellent at >99%   ECHO 05/2023 > LVEF 40-45%, G2DD, RVSP 39.47mmHg, LA mod dilated, mod AVR Coronary CT 07/24/23 > minimal non-obs CAD, calcium sore 38.9, dilated main pulmonary artery to 36mm, suggestive of PH  01/16/2018  Unsuccessful Ablation of RVOT PVCs after attempts from RVOT, above pulmonic valve, and LVOT  Plan to use AAD with Flecainide or Sotalol to suppress PVCs    Risk Assessment/Calculations:    Physical Exam:   VS:  LMP 09/27/2011    Wt Readings from Last 3 Encounters:  02/27/24 260 lb 12.8 oz (118.3 kg)  02/26/24 262 lb (118.8 kg)  02/03/24 245 lb (111.1 kg)    GEN: Well nourished, well developed in no acute distress NECK: No JVD; No carotid bruits CARDIAC: RRR, no murmurs, rubs, gallops RESPIRATORY:  CTA b/l without rales, wheezing or rhonchi  ABDOMEN: Soft, non-tender, non-distended EXTREMITIES:  No edema; No deformity   ICD site: is stable, no thinning, fluctuation, tethering  ASSESSMENT AND PLAN: .    CRT-D intact function no programming changes made  PVCs Chronic flecainide BiVe paced  Presumed <1% burden given she paces >99%  NICM Chronic CHF LVEF 40-45% by echo 2024 (chronically) >99 % BP Appears volume stable SOB seems positional, perhaps 2/2 body habitus No exertional DOE   C/w Dr. Ala Bent  Deferred question on liposuction/cardiac  status to Dr. Jens Som  HTN No changes today C/w Dr. Thurmond Butts clinic     Dispo: remotes as usual, back in clinic in 58mo again with EP, sooner if needed  Signed, [name], PA-C

## 2024-03-08 ENCOUNTER — Ambulatory Visit: Payer: 59 | Attending: Cardiology | Admitting: Physician Assistant

## 2024-03-08 VITALS — BP 140/90 | HR 60 | Ht 64.0 in | Wt 262.8 lb

## 2024-03-08 DIAGNOSIS — I428 Other cardiomyopathies: Secondary | ICD-10-CM | POA: Diagnosis not present

## 2024-03-08 DIAGNOSIS — I10 Essential (primary) hypertension: Secondary | ICD-10-CM

## 2024-03-08 DIAGNOSIS — I50812 Chronic right heart failure: Secondary | ICD-10-CM | POA: Diagnosis not present

## 2024-03-08 DIAGNOSIS — I493 Ventricular premature depolarization: Secondary | ICD-10-CM

## 2024-03-08 DIAGNOSIS — I5042 Chronic combined systolic (congestive) and diastolic (congestive) heart failure: Secondary | ICD-10-CM | POA: Diagnosis not present

## 2024-03-08 DIAGNOSIS — Z95810 Presence of automatic (implantable) cardiac defibrillator: Secondary | ICD-10-CM

## 2024-03-08 LAB — CUP PACEART INCLINIC DEVICE CHECK
Battery Remaining Longevity: 62 mo
Brady Statistic RA Percent Paced: 49 %
Brady Statistic RV Percent Paced: 99.87 %
Date Time Interrogation Session: 20250317180104
HighPow Impedance: 63 Ohm
Implantable Lead Connection Status: 753985
Implantable Lead Connection Status: 753985
Implantable Lead Connection Status: 753985
Implantable Lead Implant Date: 20150303
Implantable Lead Implant Date: 20150303
Implantable Lead Implant Date: 20150303
Implantable Lead Location: 753858
Implantable Lead Location: 753859
Implantable Lead Location: 753860
Implantable Pulse Generator Implant Date: 20230127
Lead Channel Impedance Value: 400 Ohm
Lead Channel Impedance Value: 400 Ohm
Lead Channel Impedance Value: 500 Ohm
Lead Channel Pacing Threshold Amplitude: 1 V
Lead Channel Pacing Threshold Amplitude: 1.5 V
Lead Channel Pacing Threshold Amplitude: 1.5 V
Lead Channel Pacing Threshold Amplitude: 1.625 V
Lead Channel Pacing Threshold Pulse Width: 0.5 ms
Lead Channel Pacing Threshold Pulse Width: 0.5 ms
Lead Channel Pacing Threshold Pulse Width: 0.7 ms
Lead Channel Pacing Threshold Pulse Width: 0.7 ms
Lead Channel Sensing Intrinsic Amplitude: 12 mV
Lead Channel Sensing Intrinsic Amplitude: 3.2 mV
Lead Channel Setting Pacing Amplitude: 2 V
Lead Channel Setting Pacing Amplitude: 2.25 V
Lead Channel Setting Pacing Amplitude: 2.625
Lead Channel Setting Pacing Pulse Width: 0.5 ms
Lead Channel Setting Pacing Pulse Width: 0.7 ms
Lead Channel Setting Sensing Sensitivity: 0.5 mV
Pulse Gen Serial Number: 111057141
Zone Setting Status: 755011

## 2024-03-08 NOTE — Patient Instructions (Signed)
 Medication Instructions:   Your physician recommends that you continue on your current medications as directed. Please refer to the Current Medication list given to you today.   *If you need a refill on your cardiac medications before your next appointment, please call your pharmacy*   Lab Work:  NONE ORDERED  TODAY    If you have labs (blood work) drawn today and your tests are completely normal, you will receive your results only by: MyChart Message (if you have MyChart) OR A paper copy in the mail If you have any lab test that is abnormal or we need to change your treatment, we will call you to review the results.   Testing/Procedures: NONE ORDERED  TODAY     Follow-Up: At [facility], you and your health needs are our priority.  As part of our continuing mission to provide you with exceptional heart care, we have created designated Provider Care Teams.  These Care Teams include your primary Cardiologist (physician) and Advanced Practice Providers (APPs -  Physician Assistants and Nurse Practitioners) who all work together to provide you with the care you need, when you need it.  We recommend signing up for the patient portal called "MyChart".  Sign up information is provided on this After Visit Summary.  MyChart is used to connect with patients for Virtual Visits (Telemedicine).  Patients are able to view lab/test results, encounter notes, upcoming appointments, etc.  Non-urgent messages can be sent to your provider as well.   To learn more about what you can do with MyChart, go to [email or web address].    Your next appointment:  DR Jens Som IN MAY   6 month(s)  Provider:    You may see [name], MD or one of the following Advanced Practice Providers on your designated Care Team:   Francis Dowse, [place]    Other Instructions       1st Floor: - Lobby - Registration  - Pharmacy  - Lab - Cafe  2nd Floor: - PV Lab - Diagnostic Testing (echo,  CT, nuclear med)  3rd Floor: - Vacant  4th Floor: - TCTS (cardiothoracic surgery) - AFib Clinic - Structural Heart Clinic - Vascular Surgery  - Vascular Ultrasound  5th Floor: - HeartCare Cardiology (general and EP) - Clinical Pharmacy for coumadin, hypertension, lipid, weight-loss medications, and med management appointments    Valet parking services will be available as well.

## 2024-03-10 ENCOUNTER — Encounter: Payer: Self-pay | Admitting: Medical-Surgical

## 2024-03-10 ENCOUNTER — Ambulatory Visit (INDEPENDENT_AMBULATORY_CARE_PROVIDER_SITE_OTHER)

## 2024-03-10 DIAGNOSIS — Z13820 Encounter for screening for osteoporosis: Secondary | ICD-10-CM | POA: Diagnosis not present

## 2024-03-10 DIAGNOSIS — Z780 Asymptomatic menopausal state: Secondary | ICD-10-CM

## 2024-03-19 ENCOUNTER — Telehealth: Payer: Self-pay

## 2024-03-19 NOTE — Telephone Encounter (Signed)
 Patient has been updated. She mentioned she was informed by the pharmacist that [facility] cost will be about $1200. Patient has explored other methods to reduce the cost and has had no luck. Patient will reach out to [facility] to schedule an appointment. Per patient, she has 3 injections available.

## 2024-03-19 NOTE — Telephone Encounter (Signed)
 Copied from CRM [id number]. Topic: Clinical - Prescription Issue >> Mar 18, 2024  5:33 PM Victorino Dike T wrote: Reason for CRM: tirzepatide (ZEPBOUND) 2.5 MG/0.5ML Pen- insurance not covering anymore and wants to know if there is a coupon that can be mailed to home or program  [id number]

## 2024-03-26 DIAGNOSIS — G4733 Obstructive sleep apnea (adult) (pediatric): Secondary | ICD-10-CM | POA: Diagnosis not present

## 2024-04-05 ENCOUNTER — Other Ambulatory Visit: Payer: Self-pay | Admitting: Medical-Surgical

## 2024-04-05 DIAGNOSIS — Z1231 Encounter for screening mammogram for malignant neoplasm of breast: Secondary | ICD-10-CM

## 2024-04-16 ENCOUNTER — Ambulatory Visit (INDEPENDENT_AMBULATORY_CARE_PROVIDER_SITE_OTHER): Payer: 59

## 2024-04-16 DIAGNOSIS — I428 Other cardiomyopathies: Secondary | ICD-10-CM | POA: Diagnosis not present

## 2024-04-16 LAB — CUP PACEART REMOTE DEVICE CHECK
Battery Remaining Longevity: 61 mo
Battery Remaining Percentage: 71 %
Battery Voltage: 2.98 V
Brady Statistic AP VP Percent: 30 %
Brady Statistic AP VS Percent: 1 %
Brady Statistic AS VP Percent: 70 %
Brady Statistic AS VS Percent: 1 %
Brady Statistic RA Percent Paced: 29 %
Date Time Interrogation Session: 20250425030038
HighPow Impedance: 61 Ohm
Implantable Lead Connection Status: 753985
Implantable Lead Connection Status: 753985
Implantable Lead Connection Status: 753985
Implantable Lead Implant Date: 20150303
Implantable Lead Implant Date: 20150303
Implantable Lead Implant Date: 20150303
Implantable Lead Location: 753858
Implantable Lead Location: 753859
Implantable Lead Location: 753860
Implantable Pulse Generator Implant Date: 20230127
Lead Channel Impedance Value: 380 Ohm
Lead Channel Impedance Value: 400 Ohm
Lead Channel Impedance Value: 480 Ohm
Lead Channel Pacing Threshold Amplitude: 0.625 V
Lead Channel Pacing Threshold Amplitude: 1.5 V
Lead Channel Pacing Threshold Amplitude: 1.5 V
Lead Channel Pacing Threshold Pulse Width: 0.5 ms
Lead Channel Pacing Threshold Pulse Width: 0.5 ms
Lead Channel Pacing Threshold Pulse Width: 0.7 ms
Lead Channel Sensing Intrinsic Amplitude: 1.7 mV
Lead Channel Sensing Intrinsic Amplitude: 12 mV
Lead Channel Setting Pacing Amplitude: 1.625
Lead Channel Setting Pacing Amplitude: 2.25 V
Lead Channel Setting Pacing Amplitude: 2.5 V
Lead Channel Setting Pacing Pulse Width: 0.5 ms
Lead Channel Setting Pacing Pulse Width: 0.7 ms
Lead Channel Setting Sensing Sensitivity: 0.5 mV
Pulse Gen Serial Number: 111057141
Zone Setting Status: 755011

## 2024-04-22 DIAGNOSIS — G4733 Obstructive sleep apnea (adult) (pediatric): Secondary | ICD-10-CM | POA: Diagnosis not present

## 2024-05-13 ENCOUNTER — Ambulatory Visit: Payer: Self-pay | Admitting: *Deleted

## 2024-05-13 NOTE — Telephone Encounter (Signed)
  Chief Complaint: bilateral knee pain- R knee is worse Symptoms: pain with standing- gets better after up- but the act of putting the pressure to stand causes severe pain. Patient also states she has R hip pain in the joint. Patient is requesting xray.  Frequency: chronic since April- getting worse Pertinent Negatives: Patient denies swelling, redness Disposition: [] ED /[] Urgent Care (no appt availability in office) / [x] Appointment(In office/virtual)/ []  [facility]/ [] Home Care/ [] Refused Recommended Disposition /[] [facility]/ []  Follow-up with PCP Additional Notes: Patient offered appointment first available- she is unable to come Friday and she requests afternoon appointment- she has been scheduled with first available- Dt [facility] Ledger but would prefer her PCP. Patient also has side note: She took Zepbound  for 3 wwks and had good success- but it is not covered by her insurance and she had to stop. SE: constipation   Copied from CRM [id number]. Topic: Clinical - Red Word Triage >> May 13, 2024  1:55 PM Alice Stewart wrote: Red Word that prompted transfer to Nurse Triage:   Extreme pain in both knee; started in April and has increased and last longer Reason for Disposition . Knee pain is a chronic symptom (recurrent or ongoing AND present > 4 weeks)  Answer Assessment - Initial Assessment Questions 1. LOCATION and RADIATION: "Where is the pain located?"      Bilateral- R knee- above the knee cap- side to sie and below, left knee same- not as bad 2. QUALITY: "What does the pain feel like?"  (e.g., sharp, dull, aching, burning)     *No Answer* 3. SEVERITY: "How bad is the pain?" "What does it keep you from doing?"   (Scale 1-10; or mild, moderate, severe)   -  MILD (1-3): doesn't interfere with normal activities    -  MODERATE (4-7): interferes with normal activities (e.g., work or school) or awakens from sleep, limping    -  SEVERE (8-10): excruciating pain, unable to do any  normal activities, unable to walk     9/100 intense 4. ONSET: "When did the pain start?" "Does it come and go, or is it there all the time?"     April- getting worse 5. RECURRENT: "Have you had this pain before?" If Yes, ask: "When, and what happened then?"     Using icy hot 6. SETTING: "Has there been any recent work, exercise or other activity that involved that part of the body?"      After standing- ok to walk- strain of standing causes pain  8. ASSOCIATED SYMPTOMS: "Is there any swelling or redness of the knee?"     Can't tell 9. OTHER SYMPTOMS: "Do you have any other symptoms?" (e.g., chest pain, difficulty breathing, fever, calf pain)     R hip- joint pain- possible bursitis  Protocols used: Knee Pain-A-AH

## 2024-05-14 ENCOUNTER — Ambulatory Visit
Admission: RE | Admit: 2024-05-14 | Discharge: 2024-05-14 | Disposition: A | Discharge status: Home / Self Care | Source: Ambulatory Visit | Attending: Medical-Surgical | Admitting: Medical-Surgical

## 2024-05-14 DIAGNOSIS — Z1231 Encounter for screening mammogram for malignant neoplasm of breast: Secondary | ICD-10-CM | POA: Diagnosis not present

## 2024-05-19 ENCOUNTER — Ambulatory Visit

## 2024-05-19 ENCOUNTER — Ambulatory Visit (INDEPENDENT_AMBULATORY_CARE_PROVIDER_SITE_OTHER): Admitting: Family Medicine

## 2024-05-19 ENCOUNTER — Ambulatory Visit: Payer: Self-pay | Admitting: Medical-Surgical

## 2024-05-19 ENCOUNTER — Encounter: Payer: Self-pay | Admitting: Family Medicine

## 2024-05-19 VITALS — BP 150/78 | HR 63 | Ht 64.0 in | Wt 262.0 lb

## 2024-05-19 DIAGNOSIS — G8929 Other chronic pain: Secondary | ICD-10-CM

## 2024-05-19 DIAGNOSIS — M25562 Pain in left knee: Secondary | ICD-10-CM

## 2024-05-19 DIAGNOSIS — M25561 Pain in right knee: Secondary | ICD-10-CM | POA: Diagnosis not present

## 2024-05-19 DIAGNOSIS — M1711 Unilateral primary osteoarthritis, right knee: Secondary | ICD-10-CM | POA: Diagnosis not present

## 2024-05-19 MED ORDER — MELOXICAM 15 MG PO TABS: 15.0000 mg | ORAL_TABLET | Freq: Every day | ORAL | 0 refills | Status: DC

## 2024-05-19 NOTE — Assessment & Plan Note (Signed)
 Likely related to OA.  Updated x-rays ordered.  Adding meloxicam  15 mg daily x 2 weeks then daily as needed.

## 2024-05-19 NOTE — Progress Notes (Signed)
 Alice Stewart - 68 y.o. female MRN [id number]  Date of birth: 1956/02/28  Subjective Chief Complaint  Patient presents with   Knee Pain    HPI Alice Stewart is a 67 y.o. female here today with complaint of bilateral knee pain.  Knee pain is chronic in nature with worsening over the past several weeks.  Pain is worse with weightbearing as well as going from sitting to standing and going up stairs.  She denies any mechanical symptoms.  She has not really noticed any swelling.  She has tried Tylenol  without significant relief.  ROS:  A comprehensive ROS was completed and negative except as noted per HPI  Allergies  Allergen Reactions   Hydralazine  Shortness Of Breath and Palpitations    Heavy weight on chest and severe dizziness   Amoxicillin Diarrhea and Nausea And Vomiting   Duloxetine  Nausea And Vomiting   Gabapentin Other (See Comments)    Feeling groggy   Latex Nausea And Vomiting and Other (See Comments)    Causes vaginitis after medical exams     Bystolic  [Nebivolol  Hcl] Nausea Only   Clonidine  Derivatives Diarrhea    Dizziness   Coreg  [Carvedilol ] Swelling   Estradiol  Itching    Per patient, medication caused her to have a severe yeast reaction.    Amlodipine-Atorvastatin Other (See Comments)    salty taste in mouth, stomach burning, H/A   Chlorthalidone  Nausea Only    Headache   Diltiazem Hcl Other (See Comments)    Dizzy   Lisinopril     backache   Metoprolol  Tartrate Nausea Only    HA, dizziness, lightheaded   Simvastatin Other (See Comments)    Myalgia    Spironolactone Diarrhea    Past Medical History:  Diagnosis Date   Anemia    Back pain    Benign positional vertigo    Cardiomyopathy (HCC)    Carpal tunnel syndrome    Goiter    nontoxid mutinodular   Hypertension    Hyperthyroidism    Insulin  resistance syndrome    Menopausal disorder    Menorrhagia    Migraine    Muscle spasm    trapezius   Neck pain, acute    Obesity    S/P ablation  operation for arrhythmia 01/27/2018   Formatting of this note might be different from the original. 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 St. Jude BiV ICD placed on 02/22/14     Seborrheic dermatitis    Thyroid  disorder     Past Surgical History:  Procedure Laterality Date   CHOLECYSTECTOMY     [place]    DILATION AND CURETTAGE OF UTERUS  03/23/2008   ENDOMETRIAL ABLATION  03/23/2008   GREAT TOE ARTHRODESIS, METATARSALPHALANGEAL JOINT  05/23/2006   Rt foot   HEEL SPUR SURGERY  10/23/1986   Lt foot   ICD GENERATOR CHANGEOUT N/A 01/18/2022   Procedure: ICD GENERATOR CHANGEOUT;  Surgeon: Lei Pump, MD;  Location: [facility] INVASIVE CV LAB;  Service: Cardiovascular;  Laterality: N/A;   RENAL DENERVATION Bilateral 08/13/2023   Procedure: RENAL DENERVATION;  Surgeon: Wenona Hamilton, MD;  Location: [facility] INVASIVE CV LAB;  Service: Cardiovascular;  Laterality: Bilateral;   TUBAL LIGATION  10/23/1989    Social History   Socioeconomic History   Marital status: Divorced    Spouse name: Not on file   Number of children:  3   Years of education: 14   Highest education level: Associate degree: occupational, Scientist, product/process development, or vocational program  Occupational History   Occupation: unemployed    Employer: [employer]   Occupation: legally disabled  Tobacco Use   Smoking status: Former    Current packs/day: 0.00    Average packs/day: 0.3 packs/day for 28.6 years (7.2 ttl pk-yrs)    Types: Cigarettes    Start date: 01/29/1976    Quit date: 09/22/2004    Years since quitting: 19.6   Smokeless tobacco: Never  Vaping Use   Vaping status: Never Used  Substance and Sexual Activity   Alcohol use: Not Currently    Alcohol/week: 1.0 standard drink of alcohol    Types: 1 Standard drinks or equivalent per week    Comment: 1 weekly   Drug use: No   Sexual activity: Yes     Birth control/protection: Post-menopausal  Other Topics Concern   Not on file  Social History Narrative   She has three children. She enjoys watching television, cooking, baking and sewing.   Social Drivers of Corporate investment banker Strain: Low Risk  (02/23/2024)   Overall Financial Resource Strain (CARDIA)    Difficulty of Paying Living Expenses: Not hard at all  Food Insecurity: Food Insecurity Present (02/23/2024)   Hunger Vital Sign    Worried About Running Out of Food in the Last Year: Sometimes true    Ran Out of Food in the Last Year: Sometimes true  Transportation Needs: Unmet Transportation Needs (02/23/2024)   PRAPARE - Administrator, Civil Service (Medical): Yes    Lack of Transportation (Non-Medical): Yes  Physical Activity: Insufficiently Active (02/23/2024)   Exercise Vital Sign    Days of Exercise per Week: 2 days    Minutes of Exercise per Session: 30 min  Stress: No Stress Concern Present (02/23/2024)   [employer] of Occupational Health - Occupational Stress Questionnaire    Feeling of Stress : Only a little  Social Connections: Moderately Integrated (02/23/2024)   Social Connection and Isolation Panel [NHANES]    Frequency of Communication with Friends and Family: More than three times a week    Frequency of Social Gatherings with Friends and Family: Once a week    Attends Religious Services: More than 4 times per year    Active Member of [employer] or Organizations: No    Attends Banker Meetings: Never    Marital Status: Living with partner  Recent Concern: Social Connections - Moderately Isolated (02/03/2024)   Social Connection and Isolation Panel [NHANES]    Frequency of Communication with Friends and Family: More than three times a week    Frequency of Social Gatherings with Friends and Family: Once a week    Attends Religious Services: Never    Database administrator or Organizations: No    Attends Engineer, structural:  Never    Marital Status: Living with partner    Family History  Problem Relation Age of Onset   Other Mother        MS   Hypertension Father    Hypothyroidism Sister    Hypertension Sister    Diabetes Other     Health Maintenance  Topic Date Due   COVID-19 Vaccine (4 - 2024-25 season) 08/24/2023   Zoster Vaccines- Shingrix (1 of 2) 05/28/2024 (Originally 09/23/1977)   DTaP/Tdap/Td (2 - Td or Tdap) 02/25/2025 (Originally 08/20/2023)   Pneumonia Vaccine 62+ Years old (2 of  2 - PCV) 02/25/2025 (Originally 01/26/2022)   INFLUENZA VACCINE  07/23/2024   Medicare Annual Wellness (AWV)  02/02/2025   MAMMOGRAM  05/14/2025   Cervical Cancer Screening (HPV/Pap Cotest)  04/12/2027   Colonoscopy  05/08/2033   DEXA SCAN  Completed   Hepatitis C Screening  Completed   HIV Screening  Completed   HPV VACCINES  Aged Out   Meningococcal B Vaccine  Aged Out     ----------------------------------------------------------------------------------------------------------------------------------------------------------------------------------------------------------------- Physical Exam BP (!) 150/78 (BP Location: Left Arm, Patient Position: Sitting, Cuff Size: Large)   Pulse 63   Ht 5\' 4"  (1.626 m)   Wt 262 lb (118.8 kg)   LMP 09/27/2011   SpO2 99%   BMI 44.97 kg/m   Physical Exam Constitutional:      Appearance: Normal appearance.  HENT:     Head: Normocephalic and atraumatic.  Musculoskeletal:     Comments: Knees are normal to inspection without significant effusion.  She does have small Baker's cyst on the left knee.  She has tenderness to palpation along the lateral joint line on the left knee and medial joint line of the right knee.  Range of motion is pretty good with some mild crepitus.  Neurological:     Mental Status: She is alert.      ------------------------------------------------------------------------------------------------------------------------------------------------------------------------------------------------------------------- Assessment and Plan  Chronic pain of both knees Likely related to OA.  Updated x-rays ordered.  Adding meloxicam  15 mg daily x 2 weeks then daily as needed.     Meds ordered this encounter  Medications   meloxicam  (MOBIC ) 15 MG tablet    Sig: Take 1 tablet (15 mg total) by mouth daily. Take daily x2 weeks then every 24 hours as needed.    Dispense:  30 tablet    Refill:  0    No follow-ups on file.

## 2024-05-22 DIAGNOSIS — G4733 Obstructive sleep apnea (adult) (pediatric): Secondary | ICD-10-CM | POA: Diagnosis not present

## 2024-05-24 DIAGNOSIS — G4733 Obstructive sleep apnea (adult) (pediatric): Secondary | ICD-10-CM | POA: Diagnosis not present

## 2024-05-24 NOTE — Progress Notes (Signed)
 Remote ICD transmission.

## 2024-06-02 ENCOUNTER — Ambulatory Visit: Payer: Self-pay | Admitting: Medical-Surgical

## 2024-06-15 NOTE — Progress Notes (Signed)
 HPI: FU CM and hypertension.  Previously followed by Maude Door, MD in [facility] . She has a history of echocardiogram in 2014 showed ejection fraction 25 to 30% with left bundle branch block.  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 She had CRT-D in 2015. Echocardiogram repeated May 2021 showed ejection fraction 55 to 60%.  Her PVCs are treated with flecainide . Patient previously moved back to this area from [place]  state.  Patient did have an abdominal CT August 2022 that showed normal adrenal glands.  Patient had ICD generator change January 2023.  Note by report patient did not tolerate carvedilol , Toprol , Entresto , spironolactone, Cardura  and recently discontinued labetalol .  She is intolerant to statins.  Sleep study 12/23 with moderate OSA and O2 sats as low as 76. Echo 6/24 showed EF 40-45, mild LVH, grade 2 DD, moderate LAE, mild MR, moderate AI and dilated ascending aorta 39 mm.  Renal Dopplers June 2024 showed no renal artery stenosis.  Coronary CTA August 2024 showed calcium  score 38.9 which was 73rd percentile and minimal coronary artery disease.  Renal denervation for her hypertension performed 08/13/23.  Laboratories March 2025 showed normal cortisol and TSH.  Since last seen she has some dyspnea on exertion but no orthopnea or PND.  She does have pedal edema.  She denies chest pain or syncope.  Current Outpatient Medications  Medication Sig Dispense Refill   acetaminophen  (TYLENOL ) 500 MG tablet Take 1,000 mg by mouth every 6 (six) hours as needed for moderate pain.     aspirin  81 MG EC tablet Take 81 mg by mouth daily.     Biotin 89999 MCG TABS Take 10,000 mcg by mouth daily.     Blood Pressure KIT 1 kit by Does not apply route daily. 1 kit 0   Calcium  Carb-Cholecalciferol 600-20 MG-MCG TABS Take 1 tablet by mouth daily.     COLLAGEN PO Take 1 capsule by mouth daily. With vitamin c     cyclobenzaprine  (FLEXERIL ) 10 MG tablet Take 0.5-1  tablets (5-10 mg total) by mouth 3 (three) times daily as needed for muscle spasms. Caution: can cause drowsiness 60 tablet 1   ferrous sulfate  325 (65 FE) MG tablet Take 325 mg by mouth daily.     flecainide  (TAMBOCOR ) 100 MG tablet Take 1 tablet (100 mg total) by mouth 2 (two) times daily. 180 tablet 3   furosemide  (LASIX ) 20 MG tablet Take 40 mg by mouth daily.     Ginkgo Biloba 60 MG CAPS Take 60 mg by mouth daily.     Glucosamine-Chondroitin (OSTEO BI-FLEX REGULAR STRENGTH PO) Take 2 tablets by mouth daily.     HYDROGEN PEROXIDE EX Apply 1 Application topically daily as needed (scalp itching).     indapamide  (LOZOL ) 1.25 MG tablet Take 1 tablet (1.25 mg total) by mouth daily. 90 tablet 3   losartan  (COZAAR ) 100 MG tablet Take 1 tablet (100 mg total) by mouth daily. 90 tablet 3   losartan  (COZAAR ) 50 MG tablet Take 50 mg each evening and continue with 100 mg each morning 90 tablet 3   Lutein 20 MG TABS Take 20 mg by mouth daily.     meloxicam  (MOBIC ) 15 MG tablet Take 1 tablet (15 mg total) by mouth daily. Take daily x2 weeks then every 24 hours as needed. 30 tablet 0   Multiple Vitamin (MULTIVITAMIN) tablet Take 1 tablet by mouth daily.       Multiple Vitamins-Minerals (  EMERGEN-C VITAMIN C) PACK Take 1 packet by mouth daily.     Phenylephrine HCl (SINEX REGULAR NA) Place 1 spray into the nose daily.     Probiotic Product (PROBIOTIC PO) Take 2 capsules by mouth daily.     RED CLOVER LEAF EXTRACT PO Take 1 capsule by mouth daily.     tirzepatide  (ZEPBOUND ) 2.5 MG/0.5ML Pen Inject 2.5 mg into the skin once a week. 2 mL 0   vitamin B-12 (CYANOCOBALAMIN) 1000 MCG tablet Take 1,000 mcg by mouth daily.     vitamin E 180 MG (400 UNITS) capsule Take 400 Units by mouth daily.     No current facility-administered medications for this visit.     Past Medical History:  Diagnosis Date   Anemia    Back pain    Benign positional vertigo    Cardiomyopathy (HCC)    Carpal tunnel syndrome    Goiter     nontoxid mutinodular   Hypertension    Hyperthyroidism    Insulin  resistance syndrome    Menopausal disorder    Menorrhagia    Migraine    Muscle spasm    trapezius   Neck pain, acute    Obesity    S/P ablation operation for arrhythmia 01/27/2018   Formatting of this note might be different from the original. 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 St. Jude BiV ICD placed on 02/22/14     Seborrheic dermatitis    Thyroid  disorder     Past Surgical History:  Procedure Laterality Date   CHOLECYSTECTOMY     [place]    DILATION AND CURETTAGE OF UTERUS  03/23/2008   ENDOMETRIAL ABLATION  03/23/2008   GREAT TOE ARTHRODESIS, METATARSALPHALANGEAL JOINT  05/23/2006   Rt foot   HEEL SPUR SURGERY  10/23/1986   Lt foot   ICD GENERATOR CHANGEOUT N/A 01/18/2022   Procedure: ICD GENERATOR CHANGEOUT;  Surgeon: Inocencio Soyla Lunger, MD;  Location: [facility] INVASIVE CV LAB;  Service: Cardiovascular;  Laterality: N/A;   RENAL DENERVATION Bilateral 08/13/2023   Procedure: RENAL DENERVATION;  Surgeon: Darron Deatrice LABOR, MD;  Location: [facility] INVASIVE CV LAB;  Service: Cardiovascular;  Laterality: Bilateral;   TUBAL LIGATION  10/23/1989    Social History   Socioeconomic History   Marital status: Divorced    Spouse name: Not on file   Number of children: 3   Years of education: 14   Highest education level: Associate degree: occupational, Scientist, product/process development, or vocational program  Occupational History   Occupation: unemployed    Employer: [employer]   Occupation: legally disabled  Tobacco Use   Smoking status: Former    Current packs/day: 0.00    Average packs/day: 0.3 packs/day for 28.6 years (7.2 ttl pk-yrs)    Types: Cigarettes    Start date: 01/29/1976    Quit date: 09/22/2004    Years since quitting: 19.7   Smokeless tobacco: Never  Vaping Use   Vaping status: Never Used   Substance and Sexual Activity   Alcohol use: Not Currently    Alcohol/week: 1.0 standard drink of alcohol    Types: 1 Standard drinks or equivalent per week    Comment: 1 weekly   Drug use: No   Sexual activity: Yes    Birth control/protection: Post-menopausal  Other Topics Concern   Not on file  Social History Narrative   She has three children. She  enjoys watching television, cooking, baking and sewing.   Social Drivers of Corporate investment banker Strain: Low Risk  (02/23/2024)   Overall Financial Resource Strain (CARDIA)    Difficulty of Paying Living Expenses: Not hard at all  Food Insecurity: Food Insecurity Present (02/23/2024)   Hunger Vital Sign    Worried About Running Out of Food in the Last Year: Sometimes true    Ran Out of Food in the Last Year: Sometimes true  Transportation Needs: Unmet Transportation Needs (02/23/2024)   PRAPARE - Administrator, Civil Service (Medical): Yes    Lack of Transportation (Non-Medical): Yes  Physical Activity: Insufficiently Active (02/23/2024)   Exercise Vital Sign    Days of Exercise per Week: 2 days    Minutes of Exercise per Session: 30 min  Stress: No Stress Concern Present (02/23/2024)   [employer] of Occupational Health - Occupational Stress Questionnaire    Feeling of Stress : Only a little  Social Connections: Moderately Integrated (02/23/2024)   Social Connection and Isolation Panel    Frequency of Communication with Friends and Family: More than three times a week    Frequency of Social Gatherings with Friends and Family: Once a week    Attends Religious Services: More than 4 times per year    Active Member of [employer] or Organizations: No    Attends Banker Meetings: Never    Marital Status: Living with partner  Recent Concern: Social Connections - Moderately Isolated (02/03/2024)   Social Connection and Isolation Panel    Frequency of Communication with Friends and Family: More than three times a  week    Frequency of Social Gatherings with Friends and Family: Once a week    Attends Religious Services: Never    Database administrator or Organizations: No    Attends Banker Meetings: Never    Marital Status: Living with partner  Intimate Partner Violence: Not At Risk (02/03/2024)   Humiliation, Afraid, Rape, and Kick questionnaire    Fear of Current or Ex-Partner: No    Emotionally Abused: No    Physically Abused: No    Sexually Abused: No    Family History  Problem Relation Age of Onset   Other Mother        MS   Hypertension Father    Hypothyroidism Sister    Hypertension Sister    Diabetes Other     ROS: no fevers or chills, productive cough, hemoptysis, dysphasia, odynophagia, melena, hematochezia, dysuria, hematuria, rash, seizure activity, orthopnea, PND, claudication. Remaining systems are negative.  Physical Exam: Well-developed well-nourished in no acute distress.  Skin is warm and dry.  HEENT is normal.  Neck is supple.  Chest is clear to auscultation with normal expansion.  Cardiovascular exam is regular rate and rhythm.  Abdominal exam nontender or distended. No masses palpated. Extremities show trace edema. neuro grossly intact   A/P  1 nonischemic cardiomyopathy-; she did not tolerate ACE inhibitor or beta-blockade in the past.  She is unclear about why she did not tolerate Entresto .  We have elected to try this again.  Will discontinue losartan  and begin 24/26 twice daily.  Follow blood pressure and advance Entresto  if she tolerates.  Schedule follow-up echocardiogram to reassess LV function.  2 hypertension-multiple medication intolerances including beta-blockers, indapamide , Lasix , HCTZ, spironolactone, amlodipine, Cardizem, clonidine , hydralazine , minoxidil  and Cardura .  She is status post renal denervation.  Continue ARB.  3 history of hyperlipidemia-intolerant to statins and  PCSK9 inhibitors.  Continue diet.  4 history of elevated  calcium  score-she denies chest pain.  Continue aspirin .  5 aortic insufficiency-reassess with follow-up echocardiogram.  6 history of PVCs-I have elected to continue flecainide  despite mild LV dysfunction as she had minimal plaque noted on previous CTA.  If she has worsening LV function on follow-up echocardiogram may need to discontinue.  7 obstructive sleep apnea-continue CPAP.  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 Inocencio.  Redell Shallow, MD

## 2024-06-21 ENCOUNTER — Encounter: Payer: Self-pay | Admitting: Cardiology

## 2024-06-21 ENCOUNTER — Ambulatory Visit (INDEPENDENT_AMBULATORY_CARE_PROVIDER_SITE_OTHER): Admitting: Cardiology

## 2024-06-21 VITALS — BP 192/78 | HR 66 | Ht 64.0 in | Wt 259.0 lb

## 2024-06-21 DIAGNOSIS — I428 Other cardiomyopathies: Secondary | ICD-10-CM

## 2024-06-21 DIAGNOSIS — Z95810 Presence of automatic (implantable) cardiac defibrillator: Secondary | ICD-10-CM | POA: Diagnosis not present

## 2024-06-21 DIAGNOSIS — I2510 Atherosclerotic heart disease of native coronary artery without angina pectoris: Secondary | ICD-10-CM | POA: Diagnosis not present

## 2024-06-21 DIAGNOSIS — E782 Mixed hyperlipidemia: Secondary | ICD-10-CM | POA: Diagnosis not present

## 2024-06-21 DIAGNOSIS — I10 Essential (primary) hypertension: Secondary | ICD-10-CM | POA: Diagnosis not present

## 2024-06-21 MED ORDER — SACUBITRIL-VALSARTAN 24-26 MG PO TABS: 1.0000 | ORAL_TABLET | Freq: Two times a day (BID) | ORAL | 11 refills | Status: DC

## 2024-06-21 NOTE — Patient Instructions (Signed)
 Medication Instructions:   STOP LOSARTAN   START ENTRESTO  24/26 MG ONE TABLET TWICE DAILY  *If you need a refill on your cardiac medications before your next appointment, please call your pharmacy*  Lab Work:  Your physician recommends that you return for lab work in: ONE WEEK-DO NOT NEED TO FAST  If you have labs (blood work) drawn today and your tests are completely normal, you will receive your results only by: MyChart Message (if you have MyChart) OR A paper copy in the mail If you have any lab test that is abnormal or we need to change your treatment, we will call you to review the results.  Testing/Procedures:  Your physician has requested that you have an echocardiogram. Echocardiography is a painless test that uses sound waves to create images of your heart. It provides your doctor with information about the size and shape of your heart and how well your heart's chambers and valves are working. This procedure takes approximately one hour. There are no restrictions for this procedure. Please do NOT wear cologne, perfume, aftershave, or lotions (deodorant is allowed). Please arrive 15 minutes prior to your appointment time.  Please note: We ask at that you not bring children with you during ultrasound (echo/ vascular) testing. Due to room size and safety concerns, children are not allowed in the ultrasound rooms during exams. Our front office staff cannot provide observation of children in our lobby area while testing is being conducted. An adult accompanying a patient to their appointment will only be allowed in the ultrasound room at the discretion of the ultrasound technician under special circumstances. We apologize for any inconvenience. [address]-CHECK IN ON THE 1 ST FLOOR THEN GO TO THE 2 ND FLOOR. 00 Follow-Up: At [facility], you and your health needs are our priority.  As part of our continuing mission to provide you with exceptional heart care, our  providers are all part of one team.  This team includes your primary Cardiologist (physician) and Advanced Practice Providers or APPs (Physician Assistants and Nurse Practitioners) who all work together to provide you with the care you need, when you need it.  Your next appointment:   6 month(s)  Provider:   Redell Shallow, MD

## 2024-06-22 DIAGNOSIS — G4733 Obstructive sleep apnea (adult) (pediatric): Secondary | ICD-10-CM | POA: Diagnosis not present

## 2024-06-24 ENCOUNTER — Telehealth: Payer: Self-pay | Admitting: Cardiology

## 2024-06-24 NOTE — Telephone Encounter (Signed)
 Pt c/o medication issue:  1. Name of Medication:   sacubitril -valsartan  (ENTRESTO ) 24-26 MG    2. How are you currently taking this medication (dosage and times per day)? As written   3. Are you having a reaction (difficulty breathing--STAT)? No   4. What is your medication issue? Pt called in stating since starting Entresto , she does not have as much urine output. Please advise.

## 2024-06-24 NOTE — Telephone Encounter (Signed)
 Spoke with pt who is reporting she started Entresto  as ordered on Tuesday (7/1) and noticed yesterday (7/2) a decreased volume and frequently.  She is not having any other s/s at this time but is concerned that she may be starting to retain fluid d/t taking this medication.  Advised to continue medications as listed for now.  Aware this information will be forwarded to Dr Pietro for review and she will be contacted with further instructions.

## 2024-06-24 NOTE — Telephone Encounter (Signed)
 Attempted to contact pt at phone number in demographics since no call back # is listed in this telephone note.  Call was sent to voicemail.  Left general message for pt to call back to discuss her concerns.

## 2024-06-28 MED ORDER — FLECAINIDE ACETATE 100 MG PO TABS: 100.0000 mg | ORAL_TABLET | Freq: Two times a day (BID) | ORAL | 3 refills | Status: AC

## 2024-06-28 NOTE — Telephone Encounter (Signed)
 Spoke with pt, she reports that the swelling is getting better, her feet feel tight today but because of an appointment, she has not taken her fluid pill. She report nightmares that her husband reports she screams in her sleep. She is also reporting increased palpitations and headache. She feels she is not going to be able to tolerate the entresto . She was given the okay to restart losartan  as she was taking before.

## 2024-06-29 ENCOUNTER — Other Ambulatory Visit: Payer: Self-pay | Admitting: Medical-Surgical

## 2024-06-30 ENCOUNTER — Telehealth: Payer: Self-pay | Admitting: Cardiology

## 2024-06-30 ENCOUNTER — Telehealth: Payer: Self-pay

## 2024-06-30 NOTE — Telephone Encounter (Signed)
 Pt is requesting a callback to f/u on if she still needs blood test since changes. Please advise

## 2024-06-30 NOTE — Telephone Encounter (Signed)
 RN returned patient call. Patient reported itching, burning and white discharge x 2 weeks, requesting Diflucan  with refills. RN reached out to provider Dr. Cris.   Silvano LELON Piano, RN

## 2024-06-30 NOTE — Telephone Encounter (Signed)
*  STAT* If patient is at the pharmacy, call can be transferred to refill team.   1. Which medications need to be refilled? (please list name of each medication and dose if known)   flecainide  (TAMBOCOR ) 100 MG tablet    2. Which pharmacy/location (including street and city if local pharmacy) is medication to be sent to?  [facility] - [place], [place] - [address]      3. Do they need a 30 day or 90 day supply? 90 day    Pt is out of medication

## 2024-06-30 NOTE — Telephone Encounter (Signed)
 Pt's medication was already sent to pt's pharmacy as requested on 06/28/24. Confirmation received.

## 2024-07-01 ENCOUNTER — Other Ambulatory Visit: Payer: Self-pay

## 2024-07-01 DIAGNOSIS — N898 Other specified noninflammatory disorders of vagina: Secondary | ICD-10-CM

## 2024-07-01 MED ORDER — FLUCONAZOLE 150 MG PO TABS
150.0000 mg | ORAL_TABLET | Freq: Once | ORAL | 0 refills | Status: AC
Start: 2024-07-01 — End: 2024-07-01

## 2024-07-01 NOTE — Telephone Encounter (Signed)
 Spoke with pt, Aware of dr Ludwig Clarks recommendations.

## 2024-07-01 NOTE — Progress Notes (Signed)
 RN spoke with patient and notified of Diflucan  sent to pharmacy and for patient to let us  know if symptoms do not improve. Patient would then need an appointment for any further refills. Patient agreeable to plan and wanted to schedule an appointment. Pt scheduled for July 28th with Dr. Cleatus.  Silvano LELON Piano, RN

## 2024-07-16 ENCOUNTER — Ambulatory Visit: Payer: 59

## 2024-07-16 DIAGNOSIS — I428 Other cardiomyopathies: Secondary | ICD-10-CM | POA: Diagnosis not present

## 2024-07-19 ENCOUNTER — Other Ambulatory Visit (HOSPITAL_COMMUNITY)
Admission: RE | Admit: 2024-07-19 | Discharge: 2024-07-19 | Disposition: A | Discharge status: Home / Self Care | Source: Ambulatory Visit | Attending: Obstetrics and Gynecology | Admitting: Obstetrics and Gynecology

## 2024-07-19 ENCOUNTER — Encounter: Payer: Self-pay | Admitting: Obstetrics and Gynecology

## 2024-07-19 ENCOUNTER — Ambulatory Visit: Admitting: Obstetrics and Gynecology

## 2024-07-19 VITALS — BP 142/80 | HR 60 | Ht 63.5 in | Wt 263.0 lb

## 2024-07-19 DIAGNOSIS — N898 Other specified noninflammatory disorders of vagina: Secondary | ICD-10-CM | POA: Diagnosis not present

## 2024-07-19 DIAGNOSIS — R300 Dysuria: Secondary | ICD-10-CM | POA: Diagnosis not present

## 2024-07-19 MED ORDER — FLUCONAZOLE 150 MG PO TABS: 150.0000 mg | ORAL_TABLET | ORAL | 3 refills | Status: AC

## 2024-07-19 NOTE — Progress Notes (Signed)
 GYNECOLOGY OFFICE VISIT NOTE  History:  Alice Stewart is a 68 y.o. [phone] here today for recurrent vaginal irritation.   Discussed the use of AI scribe software for clinical note transcription with the patient, who gave verbal consent to proceed.  History of Present Illness Alice Stewart is a 68 year old female with recurrent bacterial vaginosis who presents with symptoms of vaginal odor, itching, and burning.  She has a history of recurrent bacterial vaginosis, characterized by vaginal odor, itching, and burning. She has experienced similar episodes in the past and has been treated multiple times. The current episode is described as having a 'little bit of an odor' and an 'itchy, burning' sensation, distinct from a yeast infection. She attempted self-treatment with a yeast medication (Diflucan ) about ten days ago, which resolved the 'cottage cheese effect' but did not alleviate the odor and itching.  She has a history of adverse reactions to estrogen treatments, including Premarin and estradiol  tablets, which previously caused irritation and infections. She has been using Replens moisture for vaginal dryness but has not used it in the past week in preparation for this visit. She has also tried boric acid capsules for four to five nights, which provided some relief but did not fully resolve her symptoms.  Her partner is currently taking clindamycin  following a recent penis implant surgery. Her symptoms tend to improve after bathing but return the following day. She has previously used Diflucan  (150 mg) for yeast infections, with the last dose taken about ten days ago.  No recent new sexual partners. She denies the need for testing for sexually transmitted infections such as gonorrhea and chlamydia, although she requests comprehensive testing due to past concerns about her partner's fidelity. She has been tested for hepatitis, HIV, and syphilis in the past, with negative results so she  declined testing today.   The following portions of the patient's history were reviewed and updated as appropriate: allergies, current medications, past family history, past medical history, past social history, past surgical history and problem list.   Review of Systems:  Pertinent items noted in HPI and remainder of comprehensive ROS otherwise negative.  Physical Exam:  BP (!) 142/80 Comment: manual  Pulse 60   Ht 5' 3.5 (1.613 m)   Wt 263 lb (119.3 kg)   LMP 09/27/2011   BMI 45.86 kg/m  CONSTITUTIONAL: Well-developed, well-nourished female in no acute distress.  HEENT:  Normocephalic, atraumatic. External right and left ear normal. No scleral icterus.  NECK: Normal range of motion, supple, no masses noted on observation SKIN: No rash noted. Not diaphoretic. No erythema. No pallor. MUSCULOSKELETAL: Normal range of motion. No edema noted. NEUROLOGIC: Alert and oriented to person, place, and time. Normal muscle tone coordination. No cranial nerve deficit noted. PSYCHIATRIC: Normal mood and affect. Normal behavior. Normal judgment and thought content.  PELVIC: Normal appearing external genitalia; normal urethral meatus; normal appearing vaginal mucosa and cervix. Small red areas similar to satellite lesions c/w residual yeast.  No abnormal discharge noted.  Normal uterine size, no other palpable masses, no uterine or adnexal tenderness. Performed in the presence of a chaperone  Assessment and Plan:  [name] was seen today for gyn problem visit.  Diagnoses and all orders for this visit:  Vaginal discharge Previous antifungal treatments ineffective for odor. Multiple positive cultures for BV suggest suppression therapy. Partner on less effective clindamycin  for recent penile implant.  - Perform swab test for BV, yeast, trichomonas, gonorrhea, and chlamydia. - If BV confirmed,  treat both with metronidazole  and then suppression to follow.  - Consider six-month suppression therapy with  weekly or twice-weekly vaginal medication if recurrent. - If swab negative for BV, consider azithromycin  for mycoplasma or ureaplasma for her and partner.  -     Cervicovaginal ancillary only -     fluconazole  (DIFLUCAN ) 150 MG tablet; Take 1 tablet (150 mg total) by mouth every 3 (three) days. For three doses  Dysuria -     Urine Culture   Meds ordered this encounter  Medications   fluconazole  (DIFLUCAN ) 150 MG tablet    Sig: Take 1 tablet (150 mg total) by mouth every 3 (three) days. For three doses    Dispense:  3 tablet    Refill:  3     Routine preventative health maintenance measures emphasized. Please refer to After Visit Summary for other counseling recommendations.   Return if symptoms worsen or fail to improve.  Vina Solian, MD, FACOG Obstetrician & Gynecologist, [facility], [facility]

## 2024-07-20 ENCOUNTER — Ambulatory Visit: Payer: Self-pay | Admitting: Obstetrics and Gynecology

## 2024-07-20 DIAGNOSIS — N898 Other specified noninflammatory disorders of vagina: Secondary | ICD-10-CM

## 2024-07-20 LAB — CERVICOVAGINAL ANCILLARY ONLY
Bacterial Vaginitis (gardnerella): NEGATIVE
Candida Glabrata: NEGATIVE
Candida Vaginitis: NEGATIVE
Chlamydia: NEGATIVE
Comment: NEGATIVE
Comment: NEGATIVE
Comment: NEGATIVE
Comment: NEGATIVE
Comment: NEGATIVE
Comment: NORMAL
Neisseria Gonorrhea: NEGATIVE
Trichomonas: NEGATIVE

## 2024-07-20 MED ORDER — AZITHROMYCIN 500 MG PO TABS: 1000.0000 mg | ORAL_TABLET | Freq: Once | ORAL | 1 refills | Status: AC

## 2024-07-21 LAB — URINE CULTURE: Organism ID, Bacteria: NO GROWTH

## 2024-07-22 LAB — CUP PACEART REMOTE DEVICE CHECK
Battery Remaining Longevity: 59 mo
Battery Remaining Percentage: 68 %
Battery Voltage: 2.98 V
Brady Statistic AP VP Percent: 45 %
Brady Statistic AP VS Percent: 1 %
Brady Statistic AS VP Percent: 54 %
Brady Statistic AS VS Percent: 1 %
Brady Statistic RA Percent Paced: 45 %
Date Time Interrogation Session: 20250725020005
HighPow Impedance: 62 Ohm
Implantable Lead Connection Status: 753985
Implantable Lead Connection Status: 753985
Implantable Lead Connection Status: 753985
Implantable Lead Implant Date: 20150303
Implantable Lead Implant Date: 20150303
Implantable Lead Implant Date: 20150303
Implantable Lead Location: 753858
Implantable Lead Location: 753859
Implantable Lead Location: 753860
Implantable Pulse Generator Implant Date: 20230127
Lead Channel Impedance Value: 410 Ohm
Lead Channel Impedance Value: 410 Ohm
Lead Channel Impedance Value: 530 Ohm
Lead Channel Pacing Threshold Amplitude: 0.75 V
Lead Channel Pacing Threshold Amplitude: 1.375 V
Lead Channel Pacing Threshold Amplitude: 1.5 V
Lead Channel Pacing Threshold Pulse Width: 0.5 ms
Lead Channel Pacing Threshold Pulse Width: 0.5 ms
Lead Channel Pacing Threshold Pulse Width: 0.7 ms
Lead Channel Sensing Intrinsic Amplitude: 12 mV
Lead Channel Sensing Intrinsic Amplitude: 2.6 mV
Lead Channel Setting Pacing Amplitude: 1.75 V
Lead Channel Setting Pacing Amplitude: 2.25 V
Lead Channel Setting Pacing Amplitude: 2.375
Lead Channel Setting Pacing Pulse Width: 0.5 ms
Lead Channel Setting Pacing Pulse Width: 0.7 ms
Lead Channel Setting Sensing Sensitivity: 0.5 mV
Pulse Gen Serial Number: 111057141
Zone Setting Status: 755011

## 2024-07-24 ENCOUNTER — Ambulatory Visit: Payer: Self-pay | Admitting: Cardiology

## 2024-08-02 ENCOUNTER — Ambulatory Visit: Payer: Self-pay | Admitting: Cardiology

## 2024-08-02 ENCOUNTER — Ambulatory Visit (HOSPITAL_BASED_OUTPATIENT_CLINIC_OR_DEPARTMENT_OTHER)

## 2024-08-02 DIAGNOSIS — I428 Other cardiomyopathies: Secondary | ICD-10-CM

## 2024-08-02 DIAGNOSIS — I359 Nonrheumatic aortic valve disorder, unspecified: Secondary | ICD-10-CM

## 2024-08-02 LAB — ECHOCARDIOGRAM COMPLETE
AV Vena cont: 0.84 cm
Area-P 1/2: 3.31 cm2
MV M vel: 4.92 m/s
MV Peak grad: 96.8 mmHg
P 1/2 time: 426 ms
Radius: 0.5 cm
S' Lateral: 4.42 cm

## 2024-08-04 ENCOUNTER — Ambulatory Visit: Payer: Self-pay

## 2024-08-04 NOTE — Telephone Encounter (Signed)
      FYI Only or Action Required?: FYI only for provider.  Patient was last seen in primary care on 05/19/2024 by Alvia Bring, DO.  Called Nurse Triage reporting Flank Pain.  Symptoms began about a month ago.  Interventions attempted: Rest, hydration, or home remedies.  Symptoms are: gradually worsening.  Triage Disposition: See Physician Within 24 Hours  Patient/caregiver understands and will follow disposition?: yes             Copied from CRM #[id number]. Topic: Clinical - Red Word Triage >> Aug 04, 2024  2:21 PM Kevelyn M wrote: Red Word that prompted transfer to Nurse Triage: Kidney pain. Stabbing pain on right side of back close to spine. Under the right rib cage. Has had pain for about a month and tried everything she could think of to get rid of pain. She's been taking water pills. Reason for Disposition  MODERATE pain (e.g., interferes with normal activities or awakens from sleep)  Answer Assessment - Initial Assessment Questions 1. LOCATION: Where does it hurt? (e.g., left, right)     Right side of upper back and lower side  2. ONSET: When did the pain start?     Month  3. SEVERITY: How bad is the pain? (e.g., Scale 1-10; mild, moderate, or severe)     6/10 sudden stabbing  4. PATTERN: Does the pain come and go, or is it constant?      Come and goes lasts 5 minutes constant  5. CAUSE: What do you think is causing the pain?     Strained  6. OTHER SYMPTOMS:  Do you have any other symptoms? (e.g., fever, abdomen pain, vomiting, leg weakness, burning with urination, blood in urine)     Urgency (takes diuretic)  Protocols used: Flank Pain-A-AH

## 2024-08-04 NOTE — Telephone Encounter (Signed)
 Patient scheduled tomorrow with Joy.

## 2024-08-05 ENCOUNTER — Encounter: Payer: Self-pay | Admitting: Medical-Surgical

## 2024-08-05 ENCOUNTER — Ambulatory Visit (INDEPENDENT_AMBULATORY_CARE_PROVIDER_SITE_OTHER): Admitting: Medical-Surgical

## 2024-08-05 VITALS — BP 140/80 | HR 66 | Resp 20 | Ht 63.5 in | Wt 264.0 lb

## 2024-08-05 DIAGNOSIS — M546 Pain in thoracic spine: Secondary | ICD-10-CM | POA: Diagnosis not present

## 2024-08-05 DIAGNOSIS — R1011 Right upper quadrant pain: Secondary | ICD-10-CM

## 2024-08-05 MED ORDER — CYCLOBENZAPRINE HCL 10 MG PO TABS: 5.0000 mg | ORAL_TABLET | Freq: Three times a day (TID) | ORAL | 1 refills | Status: AC | PRN

## 2024-08-05 NOTE — Progress Notes (Signed)
        Established patient visit   History of Present Illness   Discussed the use of AI scribe software for clinical note transcription with the patient, who gave verbal consent to proceed.  History of Present Illness   Alice Stewart is a 68 year old female who presents with sharp, stabbing pain from the shoulder blade to the rib cage.  She experiences sharp, stabbing pain originating from the middle of her shoulder blade, radiating down to the bottom of the shoulder blade. The pain also affects the front side of her lower right rib cage, particularly under the right breast and at the bottom of the right ribs. This pain has been present for about a month, occurring daily with intensity ranging from 6 to 9 on a pain scale.  She has tried using a heating pad for relief without success. She is currently taking Flexeril  for muscle relaxation. There is no recent injury or fall. The pain is more pronounced near her spine and under her right rib cage, with occasional stabbing sensations under the left rib. She has not found significant relief from the pain despite trying various methods.      Physical Exam   Physical Exam Vitals reviewed.  Constitutional:      General: She is not in acute distress.    Appearance: Normal appearance. She is well-developed. She is obese. She is not ill-appearing.  HENT:     Head: Normocephalic and atraumatic.  Cardiovascular:     Rate and Rhythm: Normal rate and regular rhythm.     Pulses: Normal pulses.     Heart sounds: Normal heart sounds. No murmur heard.    No friction rub. No gallop.  Pulmonary:     Effort: Pulmonary effort is normal. No respiratory distress.     Breath sounds: Normal breath sounds. No wheezing.  Abdominal:     General: Bowel sounds are normal.     Palpations: Abdomen is soft.     Tenderness: There is abdominal tenderness in the right upper quadrant.  Musculoskeletal:     Thoracic back: Spasms and tenderness present.  Skin:     General: Skin is warm and dry.  Neurological:     Mental Status: She is alert and oriented to person, place, and time.  Psychiatric:        Mood and Affect: Mood normal.        Behavior: Behavior normal.        Thought Content: Thought content normal.        Judgment: Judgment normal.     Assessment & Plan   Assessment and Plan    Right upper back muscle spasm Chronic right upper back pain likely due to muscle spasm, rated up to 9/10 in severity. - Advise Flexeril  5-10 mg as needed, up to three times daily, primarily at night. - Recommend massage, heat, and ice therapy. - Suggest tennis ball for self-massage. - Consider lidocaine  patches or Tiger Balm. - Provide stretching exercises for home.  Right upper abdominal pain, etiology undetermined Right upper abdominal pain with differential including possible cysts or lesions. - Order abdominal ultrasound. - Discuss potential CT follow-up if ultrasound findings are concerning.      Follow up   Return if symptoms worsen or fail to improve.  __________________________________ Zada FREDRIK Palin, DNP, APRN, FNP-BC Primary Care and Sports Medicine [facility]

## 2024-08-09 DIAGNOSIS — H524 Presbyopia: Secondary | ICD-10-CM | POA: Diagnosis not present

## 2024-08-09 DIAGNOSIS — H2513 Age-related nuclear cataract, bilateral: Secondary | ICD-10-CM | POA: Diagnosis not present

## 2024-08-12 ENCOUNTER — Other Ambulatory Visit

## 2024-08-12 DIAGNOSIS — R1011 Right upper quadrant pain: Secondary | ICD-10-CM

## 2024-08-12 DIAGNOSIS — R110 Nausea: Secondary | ICD-10-CM | POA: Diagnosis not present

## 2024-08-12 DIAGNOSIS — R109 Unspecified abdominal pain: Secondary | ICD-10-CM | POA: Diagnosis not present

## 2024-08-12 DIAGNOSIS — K7689 Other specified diseases of liver: Secondary | ICD-10-CM | POA: Diagnosis not present

## 2024-08-12 DIAGNOSIS — Z9049 Acquired absence of other specified parts of digestive tract: Secondary | ICD-10-CM | POA: Diagnosis not present

## 2024-08-17 ENCOUNTER — Telehealth: Payer: Self-pay | Admitting: Medical-Surgical

## 2024-08-17 NOTE — Telephone Encounter (Signed)
 Copied from CRM [id number]. Topic: Clinical - Lab/Test Results >> Aug 17, 2024  3:21 PM Kevelyn M wrote: Reason for CRM: Patient is feeling better and asking when her test results will be back. Call Back # [phone]

## 2024-08-18 ENCOUNTER — Ambulatory Visit: Payer: Self-pay | Admitting: Medical-Surgical

## 2024-08-20 DIAGNOSIS — G4733 Obstructive sleep apnea (adult) (pediatric): Secondary | ICD-10-CM | POA: Diagnosis not present

## 2024-08-22 DIAGNOSIS — G4733 Obstructive sleep apnea (adult) (pediatric): Secondary | ICD-10-CM | POA: Diagnosis not present

## 2024-08-24 ENCOUNTER — Encounter: Payer: Self-pay | Admitting: Sports Medicine

## 2024-08-26 ENCOUNTER — Encounter: Payer: Self-pay | Admitting: Cardiology

## 2024-08-30 ENCOUNTER — Ambulatory Visit: Admitting: Medical-Surgical

## 2024-09-23 NOTE — Progress Notes (Signed)
 Remote ICD Transmission

## 2024-10-15 ENCOUNTER — Ambulatory Visit (INDEPENDENT_AMBULATORY_CARE_PROVIDER_SITE_OTHER): Payer: 59

## 2024-10-15 DIAGNOSIS — I428 Other cardiomyopathies: Secondary | ICD-10-CM | POA: Diagnosis not present

## 2024-10-16 LAB — CUP PACEART REMOTE DEVICE CHECK
Battery Remaining Longevity: 55 mo
Battery Remaining Percentage: 65 %
Battery Voltage: 2.96 V
Brady Statistic AP VP Percent: 47 %
Brady Statistic AP VS Percent: 1 %
Brady Statistic AS VP Percent: 53 %
Brady Statistic AS VS Percent: 1 %
Brady Statistic RA Percent Paced: 46 %
Date Time Interrogation Session: 20251024020033
HighPow Impedance: 61 Ohm
Implantable Lead Connection Status: 753985
Implantable Lead Connection Status: 753985
Implantable Lead Connection Status: 753985
Implantable Lead Implant Date: 20150303
Implantable Lead Implant Date: 20150303
Implantable Lead Implant Date: 20150303
Implantable Lead Location: 753858
Implantable Lead Location: 753859
Implantable Lead Location: 753860
Implantable Pulse Generator Implant Date: 20230127
Lead Channel Impedance Value: 380 Ohm
Lead Channel Impedance Value: 400 Ohm
Lead Channel Impedance Value: 490 Ohm
Lead Channel Pacing Threshold Amplitude: 1 V
Lead Channel Pacing Threshold Amplitude: 1.25 V
Lead Channel Pacing Threshold Amplitude: 1.5 V
Lead Channel Pacing Threshold Pulse Width: 0.5 ms
Lead Channel Pacing Threshold Pulse Width: 0.5 ms
Lead Channel Pacing Threshold Pulse Width: 0.7 ms
Lead Channel Sensing Intrinsic Amplitude: 12 mV
Lead Channel Sensing Intrinsic Amplitude: 2.3 mV
Lead Channel Setting Pacing Amplitude: 2 V
Lead Channel Setting Pacing Amplitude: 2.25 V
Lead Channel Setting Pacing Amplitude: 2.25 V
Lead Channel Setting Pacing Pulse Width: 0.5 ms
Lead Channel Setting Pacing Pulse Width: 0.7 ms
Lead Channel Setting Sensing Sensitivity: 0.5 mV
Pulse Gen Serial Number: 111057141
Zone Setting Status: 755011

## 2024-10-18 ENCOUNTER — Ambulatory Visit: Payer: Self-pay | Admitting: Cardiology

## 2024-10-18 NOTE — Progress Notes (Signed)
 Remote ICD Transmission

## 2024-10-20 ENCOUNTER — Ambulatory Visit: Payer: Self-pay

## 2024-10-20 NOTE — Telephone Encounter (Signed)
  FYI Only or Action Required?: FYI only for provider: ED advised.  Patient was last seen in primary care on 08/05/2024 by Willo Mini, NP.  Called Nurse Triage reporting Chest Pain.  Symptoms began several weeks ago.  Interventions attempted: Nothing.  Symptoms are: gradually worsening.  Triage Disposition: Go to ED Now (Notify PCP)  Patient/caregiver understands and will follow disposition?: No, refuses disposition  **See note below**        Copied from CRM #[id number]. Topic: Clinical - Red Word Triage >> Oct 20, 2024  2:43 PM Lauren C wrote: Red Word that prompted transfer to Nurse Triage: Pain on rt side under ribs that is worsening over the course of 3 days. Pain around kidney (no pain when urinating). She says she does not have a gallbladder, so that is not the cause. She also wants to have her thyroid  tested. Reason for Disposition  [1] Chest pain (or angina) comes and goes AND [2] is happening more often (increasing in frequency) or getting worse (increasing in severity)  (Exception: Chest pains that last only a few seconds.)  Difficulty breathing  Answer Assessment - Initial Assessment Questions 1. LOCATION: Where does it hurt?       Right side under rib cage, near kidney area  2. RADIATION: Does the pain go anywhere else? (e.g., into neck, jaw, arms, back)     BIL Shoulder blades x 1 week ago, patient also has fibromyalgia   3. ONSET: When did the chest pain begin? (Minutes, hours or days)      2-3 weeks ago   4. PATTERN: Does the pain come and go, or has it been constant since it started?  Does it get worse with exertion?       Intermittent   5. DURATION: How long does it last (e.g., seconds, minutes, hours)     Minutes   6. SEVERITY: How bad is the pain?  (e.g., Scale 1-10; mild, moderate, or severe)     3/10 pain is achy   7. CARDIAC RISK FACTORS: Do you have any history of heart problems or risk factors for heart disease? (e.g., angina,  prior heart attack; diabetes, high blood pressure, high cholesterol, smoker, or strong family history of heart disease)     Pacemaker, HTN (controlled)  8. PULMONARY RISK FACTORS: Do you have any history of lung disease?  (e.g., blood clots in lung, asthma, emphysema, birth control pills)     No   9. CAUSE: What do you think is causing the chest pain?     Unsure   10. OTHER SYMPTOMS: Do you have any other symptoms? (e.g., dizziness, nausea, vomiting, sweating, fever, difficulty breathing, cough)    SOB, x 3 months, during call she stated SOB is mild.   Patient is being follow by cardiology, in which she has an upcomming appt. On 11/11. Patient is seeking an appointment for tomorrow 10/30 , however based upon her heart history, and current SOB, ED was advised in which she refused; stating she does not have transportation. I offered to call for EMS, and she refused as well. Will notify [facility] of ED refusal.  Protocols used: Chest Pain-A-AH

## 2024-10-21 ENCOUNTER — Ambulatory Visit: Admitting: Medical-Surgical

## 2024-10-21 NOTE — Telephone Encounter (Signed)
 Patient scheduled today with Joy Jessup at 1:40 pm

## 2024-10-22 ENCOUNTER — Telehealth: Payer: Self-pay | Admitting: Pharmacist

## 2024-10-22 DIAGNOSIS — M791 Myalgia, unspecified site: Secondary | ICD-10-CM

## 2024-10-22 NOTE — Progress Notes (Signed)
 Pharmacy Quality Measure Review  This patient is appearing on the insurance-providing list for being at risk of failing the adherence measure for Statin Therapy for Patients with Cardiovascular Disease [facility]) medications this calendar year.   History of myalgia with several statins. Recommend use of myalgia due to statin (M79.10, T46.6X5A) at upcoming cardiology visit to exclude from the measure for this calendar year.   Catie [facility], PharmD, [facility] Clinical Pharmacist [phone]

## 2024-10-28 NOTE — Progress Notes (Addendum)
 Cardiology Office Note:  .   Date:  10/28/2024  ID:  Alice Stewart, DOB 09-13-1956, MRN [id number] PCP: Alice Mini, NP  [facility] Providers Cardiologist:  Alice Shallow, MD Electrophysiologist:  Alice Gladis Norton, MD {  History of Present Illness: Alice Stewart is a 69 y.o. female w/PMHx of  Chronic CHF, LBBB,  NICM w/improved LVEF CRT-D RVOT PVCs HTN >>  s/p renal denervation  Saw Alice Stewart 09/11/23, doing well, 99% BP, discussed flecainide  and need to stop if LVEF was to worsen  Seeing Drs Stewart and Alice Stewart ([facility]) since then Dr. Raford 02/27/24, numerous medication intolerances for her HTN as well as statin intol,  Bystolic  - nausea Clonidine  - bad side effects, anxious, jittery Carvedilol  - swelling Amlodipine-atorvastatin - salty taste, stomach burning, headache Chlorthalidone  - headache Diltiazem - dizziness Lisinopril - backache Metoprolol  tartrate - nausea, headache, dizziness Spironolactone - diarrhea Hydralazine  - chest heaviness, SOB, dizziness, unable to sleep after 1 tablet Labetolol - side effects  Doxazosin  - did not tolerate Entresto  - dizziness Valsartan  - dizziness, memory issues, knee pain, fatigue Losartan  HCTZ - ankle swelling Chlorthalidone  - nausea Minoxidil  Indapamide -nausea, HA  Home BPs better then office Struggling with weight despite Zepbound   I saw her 03/08/24 She reports some SOB, not new, perhaps more noted in the last year When 1st laying down at night, another example when laying back in the bath tub. Not with exertion + palpitations, these are unchanged for years No near syncope or syncope No CP EKG BiVe paced 99% BP (presumed 1% PVC burden) No changes made Not felt to be volume OL  She saw Alice Stewart 06/21/24 She was uncertain what her Entresto  intolerance was and decided to try it again stopped her losartan  > Entresto  with plans to follow up echo Pending that +/- flecainide  pending  that  Unfortunately quickly reported that she observed reduced urine output she felt 2/2 the entresto . Despite Dr. Vertie feeling that was not likely the Entresto  > pt uncomfortable to continue > resume her losartan  and stopped the Entresto    Today's visit is scheduled as a 6 mo flecainide  visit ROS:   All in all doing OK, though has noticed in the last 5-6 mo perhaps getting winded earlier/sooner then usual She self stopped her lasix  ~ 81mo ago because she was not having any swelling and night time urination waking her She has a sharp/momentary and random high R chest pain, not new, not changing, no CP otherwise No near syncope or syncope  Device information SJM CRT-D implanted 02/22/2014, gen change 01/18/22  Arrhythmia/AAD hx PVCs 2019 unsuccessful PVC ablation Flecainide  goes back to 2019  Studies Reviewed: SABRA    EKG done today and reviewed by myself:  AS/VP 62bpm, QRS morphology unchanged for years, duration today No PVCs  03/08/24 AV paced 60bpm, no PVCs  DEVICE interrogation done today and reviewed by myself Battery and auto lead measurements are good No VT Device reports 952 AMS episodes All available EGMs are reviewed All are false for FF, and noted to be triggered a by a single AS event. With industry support review PAVB increased to , and atrial trigger type changed from AMS > AT/AF   ECHO 05/2023 > LVEF 40-45%, G2DD, RVSP 39.35mmHg, LA mod dilated, mod AVR Coronary CT 07/24/23 > minimal non-obs CAD, calcium  sore 38.9, dilated main pulmonary artery to 36mm, suggestive of PH  01/16/2018  Unsuccessful Ablation of RVOT PVCs after attempts from RVOT, above pulmonic  valve, and LVOT  Plan to use AAD with Flecainide  or Sotalol to suppress PVCs    Risk Assessment/Calculations:    Physical Exam:   VS:  LMP 09/27/2011    Wt Readings from Last 3 Encounters:  08/05/24 264 lb (119.7 kg)  07/19/24 263 lb (119.3 kg)  06/21/24 259 lb (117.5 kg)    GEN: Well  nourished, well developed in no acute distress NECK: No JVD; No carotid bruits CARDIAC: RRR, 2/6 DM, rubs, gallops RESPIRATORY:  CTA b/l without rales, wheezing or rhonchi  ABDOMEN: Soft, non-tender, non-distended EXTREMITIES: wearing support stockings, perhaps trace edema; No deformity   ICD site: is stable, no thinning, fluctuation, tethering  ASSESSMENT AND PLAN: .    CRT-D intact function PAVB increased to , and atrial trigger type changed from AMS > AT/AF  PVCs Chronic flecainide  (numerous medication intolerances including nodal blocking agents) BiVe paced >99% <1% PVC burden   NICM Chronic CHF LVEF 40-45% by echo 2024 (chronically) >99 % BP Appears volume stable, CorVue wobbles up/down about threshold She self stopped her lasix  with slow increase in DOE and some symptoms of orthopnea as well I advised that she resume her lasix  at 20mg  daily to take in the AM BMET in 7-10 days She has a 2/6 DM on exam  She has an echo ordered > pending schedule C/w Dr. Harley  Deferred question on liposuction/cardiac status to Dr. Pietro  HTN No changes today C/w Dr. [facility] clinic     Dispo: remotes as usual, back in clinic in 73mo , sooner if needed  Signed, Maleyah Evans Macario Arthur, PA-C

## 2024-11-02 ENCOUNTER — Ambulatory Visit: Payer: Self-pay | Admitting: Cardiology

## 2024-11-02 ENCOUNTER — Ambulatory Visit: Attending: Physician Assistant | Admitting: Physician Assistant

## 2024-11-02 VITALS — BP 142/82 | HR 61 | Ht 63.5 in | Wt 263.0 lb

## 2024-11-02 DIAGNOSIS — I493 Ventricular premature depolarization: Secondary | ICD-10-CM | POA: Diagnosis not present

## 2024-11-02 DIAGNOSIS — I428 Other cardiomyopathies: Secondary | ICD-10-CM

## 2024-11-02 DIAGNOSIS — Z95810 Presence of automatic (implantable) cardiac defibrillator: Secondary | ICD-10-CM

## 2024-11-02 DIAGNOSIS — I5022 Chronic systolic (congestive) heart failure: Secondary | ICD-10-CM

## 2024-11-02 DIAGNOSIS — M791 Myalgia, unspecified site: Secondary | ICD-10-CM

## 2024-11-02 DIAGNOSIS — Z5181 Encounter for therapeutic drug level monitoring: Secondary | ICD-10-CM | POA: Diagnosis not present

## 2024-11-02 DIAGNOSIS — Z79899 Other long term (current) drug therapy: Secondary | ICD-10-CM

## 2024-11-02 LAB — CUP PACEART INCLINIC DEVICE CHECK
Battery Remaining Longevity: 55 mo
Brady Statistic RA Percent Paced: 47 %
Brady Statistic RV Percent Paced: 99.76 %
Date Time Interrogation Session: 20251111130431
HighPow Impedance: 61.875
Implantable Lead Connection Status: 753985
Implantable Lead Connection Status: 753985
Implantable Lead Connection Status: 753985
Implantable Lead Implant Date: 20150303
Implantable Lead Implant Date: 20150303
Implantable Lead Implant Date: 20150303
Implantable Lead Location: 753858
Implantable Lead Location: 753859
Implantable Lead Location: 753860
Implantable Pulse Generator Implant Date: 20230127
Lead Channel Impedance Value: 400 Ohm
Lead Channel Impedance Value: 412.5 Ohm
Lead Channel Impedance Value: 500 Ohm
Lead Channel Pacing Threshold Amplitude: 0.625 V
Lead Channel Pacing Threshold Amplitude: 1.25 V
Lead Channel Pacing Threshold Amplitude: 1.625 V
Lead Channel Pacing Threshold Pulse Width: 0.5 ms
Lead Channel Pacing Threshold Pulse Width: 0.5 ms
Lead Channel Pacing Threshold Pulse Width: 0.7 ms
Lead Channel Sensing Intrinsic Amplitude: 12 mV
Lead Channel Sensing Intrinsic Amplitude: 2 mV
Lead Channel Setting Pacing Amplitude: 1.625
Lead Channel Setting Pacing Amplitude: 2.25 V
Lead Channel Setting Pacing Amplitude: 2.25 V
Lead Channel Setting Pacing Pulse Width: 0.5 ms
Lead Channel Setting Pacing Pulse Width: 0.7 ms
Lead Channel Setting Sensing Sensitivity: 0.5 mV
Pulse Gen Serial Number: 111057141
Zone Setting Status: 755011

## 2024-11-02 MED ORDER — FUROSEMIDE 20 MG PO TABS: 20.0000 mg | ORAL_TABLET | Freq: Every day | ORAL | 3 refills | Status: AC

## 2024-11-02 NOTE — Patient Instructions (Addendum)
 Medication Instructions:   START  TAKING :  FUROSEMIDE   20  MG ONCE  A  DAY     *If you need a refill on your cardiac medications before your next appointment, please call your pharmacy*   Lab Work:  PLEASE GO DOWN STAIRS  [facility]  FIRST FLOOR   ( GET OFF ELEVATORS WALK TOWARDS WAITING AREA LAB LOCATED BY PHARMACY):   RETURN FOR BMET IN  7-10 DAYS      If you have labs (blood work) drawn today and your tests are completely normal, you will receive your results only by: MyChart Message (if you have MyChart) OR A paper copy in the mail If you have any lab test that is abnormal or we need to change your treatment, we will call you to review the results.  Testing/Procedures: Your physician has requested that you have an echocardiogram. Echocardiography is a painless test that uses sound waves to create images of your heart. It provides your doctor with information about the size and shape of your heart and how well your heart's chambers and valves are working. This procedure takes approximately one hour. There are no restrictions for this procedure. Please do NOT wear cologne, perfume, aftershave, or lotions (deodorant is allowed). Please arrive 15 minutes prior to your appointment time.  Please note: We ask at that you not bring children with you during ultrasound (echo/ vascular) testing. Due to room size and safety concerns, children are not allowed in the ultrasound rooms during exams. Our front office staff cannot provide observation of children in our lobby area while testing is being conducted. An adult accompanying a patient to their appointment will only be allowed in the ultrasound room at the discretion of the ultrasound technician under special circumstances. We apologize for any inconvenience.    Follow-Up: At [facility], you and your health needs are our priority.  As part of our continuing mission to provide you with exceptional heart care, our providers are all part  of one team.  This team includes your primary Cardiologist (physician) and Advanced Practice Providers or APPs (Physician Assistants and Nurse Practitioners) who all work together to provide you with the care you need, when you need it.  Your next appointment:   2 month(s)  Provider:   Charlies Arthur, PA-C  ( CONTACT  CASSIE HALL/ ANGELINE HAMMER FOR EP SCHEDULING ISSUES )   We recommend signing up for the patient portal called MyChart.  Sign up information is provided on this After Visit Summary.  MyChart is used to connect with patients for Virtual Visits (Telemedicine).  Patients are able to view lab/test results, encounter notes, upcoming appointments, etc.  Non-urgent messages can be sent to your provider as well.   To learn more about what you can do with MyChart, go to [email or web address].   Other Instructions

## 2024-12-08 ENCOUNTER — Ambulatory Visit (HOSPITAL_COMMUNITY): Admission: RE | Admit: 2024-12-08 | Discharge: 2024-12-08 | Attending: Cardiology | Admitting: Cardiology

## 2024-12-08 ENCOUNTER — Encounter (HOSPITAL_COMMUNITY): Payer: Self-pay

## 2024-12-08 ENCOUNTER — Other Ambulatory Visit: Payer: Self-pay | Admitting: *Deleted

## 2024-12-08 DIAGNOSIS — I428 Other cardiomyopathies: Secondary | ICD-10-CM

## 2024-12-08 DIAGNOSIS — I359 Nonrheumatic aortic valve disorder, unspecified: Secondary | ICD-10-CM

## 2024-12-11 ENCOUNTER — Other Ambulatory Visit: Payer: Self-pay | Admitting: Cardiology

## 2025-01-02 NOTE — Progress Notes (Unsigned)
 " Cardiology Office Note:  .   Date:  01/02/2025  ID:  Alice Stewart, DOB 1956/05/22, MRN [id number] PCP: Willo Mini, NP  [facility] Providers Cardiologist:  Redell Shallow, MD Electrophysiologist:  Will Gladis Norton, MD {  History of Present Illness: Alice Stewart is a 69 y.o. female w/PMHx of  Chronic CHF, LBBB,  NICM w/improved LVEF CRT-D RVOT PVCs HTN >>  s/p renal denervation  Saw Brandi 09/11/23, doing well, 99% BP, discussed flecainide  and need to stop if LVEF was to worsen  Seeing Drs Shallow and Raford ([facility]) since then Dr. Raford 02/27/24, numerous medication intolerances for her HTN as well as statin intol,  Bystolic  - nausea Clonidine  - bad side effects, anxious, jittery Carvedilol  - swelling Amlodipine-atorvastatin - salty taste, stomach burning, headache Chlorthalidone  - headache Diltiazem - dizziness Lisinopril - backache Metoprolol  tartrate - nausea, headache, dizziness Spironolactone - diarrhea Hydralazine  - chest heaviness, SOB, dizziness, unable to sleep after 1 tablet Labetolol - side effects  Doxazosin  - did not tolerate Entresto  - dizziness Valsartan  - dizziness, memory issues, knee pain, fatigue Losartan  HCTZ - ankle swelling Chlorthalidone  - nausea Minoxidil  Indapamide -nausea, HA  Home BPs better then office Struggling with weight despite Zepbound   I saw her 03/08/24 She reports some SOB, not new, perhaps more noted in the last year When 1st laying down at night, another example when laying back in the bath tub. Not with exertion + palpitations, these are unchanged for years No near syncope or syncope No CP EKG BiVe paced 99% BP (presumed 1% PVC burden) No changes made Not felt to be volume OL  She saw Dr. Shallow 06/21/24 She was uncertain what her Entresto  intolerance was and decided to try it again stopped her losartan  > Entresto  with plans to follow up echo Pending that +/- flecainide  pending  that  Unfortunately quickly reported that she observed reduced urine output she felt 2/2 the entresto . Despite Dr. Vertie feeling that was not likely the Entresto  > pt uncomfortable to continue > resume her losartan  and stopped the Entresto    I saw her 11/02/24 All in all doing OK, though has noticed in the last 5-6 mo perhaps getting winded earlier/sooner then usual She self stopped her lasix  ~ 64mo ago because she was not having any swelling and night time urination waking her She has a sharp/momentary and random high R chest pain, not new, not changing, no CP otherwise No near syncope or syncope Advised to resume her lastix, planned labs She had a DM on exam, already had an echo ordered but not scheduled > she inquired about getting liposuction done, advised to schedule her echo and f/u with Dr. Harley Device reports 952 AMS episodes All available EGMs are reviewed All are false for FF, and noted to be triggered a by a single AS event. With industry support review PAVB increased to , and atrial trigger type changed from AMS > AT/AF  Today's visit is scheduled as a 2 mo f/u visit ROS:   *** echo still not scheduled *** volume *** needs gen cards f/u *** PMTs? AMS? *** BP% *** pVCs   Device information SJM CRT-D implanted 02/22/2014, gen change 01/18/22  Arrhythmia/AAD hx PVCs 2019 unsuccessful PVC ablation Flecainide  goes back to 2019  Studies Reviewed: SABRA    EKG done today and reviewed by myself:  ***  11/02/24: AS/VP 62bpm, QRS morphology unchanged for years, duration today No PVCs  03/08/24 AV paced 60bpm, no  PVCs  Brief DEVICE interrogation done today and reviewed by myself *** Battery and auto lead measurements are good *** No VT ***   ECHO 05/2023 > LVEF 40-45%, G2DD, RVSP 39.50mmHg, LA mod dilated, mod AVR Coronary CT 07/24/23 > minimal non-obs CAD, calcium  sore 38.9, dilated main pulmonary artery to 36mm, suggestive of PH  01/16/2018   Unsuccessful Ablation of RVOT PVCs after attempts from RVOT, above pulmonic valve, and LVOT  Plan to use AAD with Flecainide  or Sotalol to suppress PVCs    Risk Assessment/Calculations:    Physical Exam:   VS:  LMP 09/27/2011    Wt Readings from Last 3 Encounters:  11/02/24 263 lb (119.3 kg)  08/05/24 264 lb (119.7 kg)  07/19/24 263 lb (119.3 kg)    GEN: Well nourished, well developed in no acute distress NECK: No JVD; No carotid bruits CARDIAC: *** RRR, 2/6 DM, rubs, gallops RESPIRATORY: *** CTA b/l without rales, wheezing or rhonchi  ABDOMEN: Soft, non-tender, non-distended EXTREMITIES: *** wearing support stockings, perhaps trace edema; No deformity   *** ICD site: is stable, no thinning, fluctuation, tethering  ASSESSMENT AND PLAN: .    CRT-D *** intact function *** no programing changes made   PVCs Chronic flecainide  (numerous medication intolerances including nodal blocking agents) BiVe paced *** % *** % PVC burden   NICM Chronic CHF LVEF 40-45% by echo 2024 (chronically) *** % BP *** Appears volume stable, CorVue wobbles up/down about threshold *** She has a 2/6 DM on exam  Echo ordered not yet scheduled C/w Dr. Harley  HTN No changes today C/w Dr. [facility]     Dispo: remotes as usual, back in clinic in ***mo , sooner if needed  Signed, Quanasia Defino Macario Arthur, PA-C   "

## 2025-01-03 ENCOUNTER — Ambulatory Visit: Admitting: Physician Assistant

## 2025-01-14 ENCOUNTER — Ambulatory Visit: Attending: Cardiology

## 2025-01-14 DIAGNOSIS — I428 Other cardiomyopathies: Secondary | ICD-10-CM

## 2025-01-17 LAB — CUP PACEART REMOTE DEVICE CHECK
Battery Remaining Longevity: 50 mo
Battery Remaining Percentage: 62 %
Battery Voltage: 2.96 V
Brady Statistic AP VP Percent: 63 %
Brady Statistic AP VS Percent: 1 %
Brady Statistic AS VP Percent: 37 %
Brady Statistic AS VS Percent: 1 %
Brady Statistic RA Percent Paced: 63 %
Date Time Interrogation Session: 20260123020055
HighPow Impedance: 63 Ohm
Implantable Lead Connection Status: 753985
Implantable Lead Connection Status: 753985
Implantable Lead Connection Status: 753985
Implantable Lead Implant Date: 20150303
Implantable Lead Implant Date: 20150303
Implantable Lead Implant Date: 20150303
Implantable Lead Location: 753858
Implantable Lead Location: 753859
Implantable Lead Location: 753860
Implantable Pulse Generator Implant Date: 20230127
Lead Channel Impedance Value: 400 Ohm
Lead Channel Impedance Value: 400 Ohm
Lead Channel Impedance Value: 500 Ohm
Lead Channel Pacing Threshold Amplitude: 1.125 V
Lead Channel Pacing Threshold Amplitude: 1.5 V
Lead Channel Pacing Threshold Amplitude: 1.5 V
Lead Channel Pacing Threshold Pulse Width: 0.5 ms
Lead Channel Pacing Threshold Pulse Width: 0.5 ms
Lead Channel Pacing Threshold Pulse Width: 0.7 ms
Lead Channel Sensing Intrinsic Amplitude: 1.9 mV
Lead Channel Sensing Intrinsic Amplitude: 12 mV
Lead Channel Setting Pacing Amplitude: 2.125
Lead Channel Setting Pacing Amplitude: 2.25 V
Lead Channel Setting Pacing Amplitude: 2.5 V
Lead Channel Setting Pacing Pulse Width: 0.5 ms
Lead Channel Setting Pacing Pulse Width: 0.7 ms
Lead Channel Setting Sensing Sensitivity: 0.5 mV
Pulse Gen Serial Number: 111057141
Zone Setting Status: 755011

## 2025-01-18 ENCOUNTER — Ambulatory Visit: Payer: Self-pay | Admitting: Cardiology

## 2025-01-19 NOTE — Progress Notes (Signed)
 Remote ICD Transmission

## 2025-02-03 ENCOUNTER — Ambulatory Visit

## 2025-02-25 ENCOUNTER — Ambulatory Visit: Admitting: Physician Assistant

## 2025-04-15 ENCOUNTER — Ambulatory Visit

## 2025-07-15 ENCOUNTER — Ambulatory Visit

## 2025-10-14 ENCOUNTER — Ambulatory Visit

## 2026-01-13 ENCOUNTER — Ambulatory Visit
